# Patient Record
Sex: Male | Born: 1950
Health system: Southern US, Community
[De-identification: ages and names within clinical notes are randomized; demographics above are authoritative.]

## PROBLEM LIST (undated history)

## (undated) DIAGNOSIS — J309 Allergic rhinitis, unspecified: Secondary | ICD-10-CM

## (undated) DIAGNOSIS — I10 Essential (primary) hypertension: Secondary | ICD-10-CM

## (undated) DIAGNOSIS — M549 Dorsalgia, unspecified: Secondary | ICD-10-CM

## (undated) DIAGNOSIS — E785 Hyperlipidemia, unspecified: Secondary | ICD-10-CM

## (undated) DIAGNOSIS — J449 Chronic obstructive pulmonary disease, unspecified: Secondary | ICD-10-CM

## (undated) DIAGNOSIS — I639 Cerebral infarction, unspecified: Secondary | ICD-10-CM

## (undated) DIAGNOSIS — J189 Pneumonia, unspecified organism: Secondary | ICD-10-CM

## (undated) DIAGNOSIS — J45909 Unspecified asthma, uncomplicated: Secondary | ICD-10-CM

## (undated) DIAGNOSIS — I2510 Atherosclerotic heart disease of native coronary artery without angina pectoris: Secondary | ICD-10-CM

## (undated) DIAGNOSIS — K219 Gastro-esophageal reflux disease without esophagitis: Secondary | ICD-10-CM

## (undated) DIAGNOSIS — M1990 Unspecified osteoarthritis, unspecified site: Secondary | ICD-10-CM

## (undated) DIAGNOSIS — J329 Chronic sinusitis, unspecified: Secondary | ICD-10-CM

## (undated) HISTORY — DX: Essential (primary) hypertension: I10

## (undated) HISTORY — DX: Unspecified osteoarthritis, unspecified site: M19.90

## (undated) HISTORY — PX: OTHER SURGICAL HISTORY: SHX169

## (undated) HISTORY — PX: KNEE ARTHROSCOPY: SUR90

## (undated) HISTORY — DX: Atherosclerotic heart disease of native coronary artery without angina pectoris: I25.10

## (undated) HISTORY — DX: Chronic sinusitis, unspecified: J32.9

## (undated) HISTORY — DX: Unspecified asthma, uncomplicated: J45.909

## (undated) HISTORY — DX: Allergic rhinitis, unspecified: J30.9

## (undated) HISTORY — PX: CARDIAC CATHETERIZATION: SHX172

## (undated) HISTORY — DX: Cerebral infarction, unspecified: I63.9

---

## 1999-02-02 ENCOUNTER — Ambulatory Visit (HOSPITAL_COMMUNITY): Admission: RE | Admit: 1999-02-02 | Discharge: 1999-02-02 | Payer: Self-pay | Admitting: Specialist

## 1999-02-02 ENCOUNTER — Encounter: Payer: Self-pay | Admitting: Specialist

## 2004-09-05 ENCOUNTER — Ambulatory Visit (HOSPITAL_BASED_OUTPATIENT_CLINIC_OR_DEPARTMENT_OTHER): Admission: RE | Admit: 2004-09-05 | Discharge: 2004-09-05 | Payer: Self-pay | Admitting: Specialist

## 2005-04-22 ENCOUNTER — Ambulatory Visit: Payer: Self-pay | Admitting: Internal Medicine

## 2005-05-14 ENCOUNTER — Ambulatory Visit: Payer: Self-pay | Admitting: Internal Medicine

## 2005-09-17 ENCOUNTER — Ambulatory Visit: Payer: Self-pay | Admitting: Internal Medicine

## 2005-10-08 ENCOUNTER — Ambulatory Visit: Payer: Self-pay | Admitting: Internal Medicine

## 2005-10-22 ENCOUNTER — Encounter: Payer: Self-pay | Admitting: Internal Medicine

## 2005-10-22 ENCOUNTER — Ambulatory Visit: Payer: Self-pay | Admitting: Internal Medicine

## 2006-02-10 ENCOUNTER — Ambulatory Visit: Payer: Self-pay | Admitting: Internal Medicine

## 2006-02-14 ENCOUNTER — Ambulatory Visit: Payer: Self-pay | Admitting: Internal Medicine

## 2006-05-26 ENCOUNTER — Ambulatory Visit: Payer: Self-pay | Admitting: Internal Medicine

## 2006-12-08 ENCOUNTER — Ambulatory Visit: Payer: Self-pay | Admitting: Internal Medicine

## 2007-01-13 ENCOUNTER — Ambulatory Visit: Payer: Self-pay | Admitting: Internal Medicine

## 2007-06-19 DIAGNOSIS — J309 Allergic rhinitis, unspecified: Secondary | ICD-10-CM

## 2007-06-19 DIAGNOSIS — I10 Essential (primary) hypertension: Secondary | ICD-10-CM

## 2007-06-19 HISTORY — DX: Allergic rhinitis, unspecified: J30.9

## 2007-06-19 HISTORY — DX: Essential (primary) hypertension: I10

## 2007-07-17 ENCOUNTER — Encounter: Payer: Self-pay | Admitting: Internal Medicine

## 2007-09-15 ENCOUNTER — Ambulatory Visit: Payer: Self-pay | Admitting: Internal Medicine

## 2007-09-15 DIAGNOSIS — J329 Chronic sinusitis, unspecified: Secondary | ICD-10-CM | POA: Insufficient documentation

## 2007-09-15 HISTORY — DX: Chronic sinusitis, unspecified: J32.9

## 2007-09-23 ENCOUNTER — Ambulatory Visit: Payer: Self-pay | Admitting: Internal Medicine

## 2007-09-23 DIAGNOSIS — J019 Acute sinusitis, unspecified: Secondary | ICD-10-CM

## 2007-09-23 LAB — CONVERTED CEMR LAB
ALT: 17 units/L (ref 0–53)
AST: 18 units/L (ref 0–37)
Albumin: 3.9 g/dL (ref 3.5–5.2)
Alkaline Phosphatase: 65 units/L (ref 39–117)
BUN: 13 mg/dL (ref 6–23)
Basophils Absolute: 0 10*3/uL (ref 0.0–0.1)
Basophils Relative: 0.1 % (ref 0.0–1.0)
Bilirubin, Direct: 0.2 mg/dL (ref 0.0–0.3)
CO2: 31 meq/L (ref 19–32)
Calcium: 9 mg/dL (ref 8.4–10.5)
Chloride: 102 meq/L (ref 96–112)
Cholesterol: 180 mg/dL (ref 0–200)
Creatinine, Ser: 0.9 mg/dL (ref 0.4–1.5)
Eosinophils Absolute: 0.5 10*3/uL (ref 0.0–0.6)
Eosinophils Relative: 5.5 % — ABNORMAL HIGH (ref 0.0–5.0)
GFR calc Af Amer: 113 mL/min
GFR calc non Af Amer: 93 mL/min
Glucose, Bld: 90 mg/dL (ref 70–99)
HCT: 42.2 % (ref 39.0–52.0)
HDL: 33.6 mg/dL — ABNORMAL LOW (ref >39.0)
Hemoglobin: 15.1 g/dL (ref 13.0–17.0)
LDL Cholesterol: 133 mg/dL — ABNORMAL HIGH (ref 0–99)
Lymphocytes Relative: 17.1 % (ref 12.0–46.0)
MCHC: 35.9 g/dL (ref 30.0–36.0)
MCV: 84.5 fL (ref 78.0–100.0)
Monocytes Absolute: 1 10*3/uL — ABNORMAL HIGH (ref 0.2–0.7)
Monocytes Relative: 11.5 % — ABNORMAL HIGH (ref 3.0–11.0)
Neutro Abs: 6 10*3/uL (ref 1.4–7.7)
Neutrophils Relative %: 65.8 % (ref 43.0–77.0)
PSA: 0.45 ng/mL (ref 0.10–4.00)
Platelets: 232 10*3/uL (ref 150–400)
Potassium: 4.2 meq/L (ref 3.5–5.1)
RBC: 4.99 M/uL (ref 4.22–5.81)
RDW: 12.9 % (ref 11.5–14.6)
Sodium: 140 meq/L (ref 135–145)
TSH: 1.54 microintl units/mL (ref 0.35–5.50)
Total Bilirubin: 0.8 mg/dL (ref 0.3–1.2)
Total CHOL/HDL Ratio: 5.4
Total Protein: 6.4 g/dL (ref 6.0–8.3)
Triglycerides: 68 mg/dL (ref 0–149)
VLDL: 14 mg/dL (ref 0–40)
WBC: 9 10*3/uL (ref 4.5–10.5)

## 2007-09-29 ENCOUNTER — Ambulatory Visit: Payer: Self-pay | Admitting: Internal Medicine

## 2008-03-16 ENCOUNTER — Encounter: Payer: Self-pay | Admitting: Internal Medicine

## 2008-03-21 ENCOUNTER — Ambulatory Visit: Payer: Self-pay | Admitting: Internal Medicine

## 2008-03-21 DIAGNOSIS — R079 Chest pain, unspecified: Secondary | ICD-10-CM

## 2008-03-28 ENCOUNTER — Telehealth: Payer: Self-pay | Admitting: Internal Medicine

## 2008-04-26 ENCOUNTER — Telehealth: Payer: Self-pay | Admitting: Internal Medicine

## 2008-07-05 ENCOUNTER — Ambulatory Visit: Payer: Self-pay | Admitting: Internal Medicine

## 2008-08-17 ENCOUNTER — Ambulatory Visit: Payer: Self-pay | Admitting: Internal Medicine

## 2008-08-17 ENCOUNTER — Telehealth (INDEPENDENT_AMBULATORY_CARE_PROVIDER_SITE_OTHER): Payer: Self-pay

## 2008-08-17 DIAGNOSIS — J45909 Unspecified asthma, uncomplicated: Secondary | ICD-10-CM | POA: Insufficient documentation

## 2008-08-17 DIAGNOSIS — J069 Acute upper respiratory infection, unspecified: Secondary | ICD-10-CM | POA: Insufficient documentation

## 2008-08-17 HISTORY — DX: Unspecified asthma, uncomplicated: J45.909

## 2008-08-22 ENCOUNTER — Telehealth: Payer: Self-pay | Admitting: Internal Medicine

## 2008-09-06 ENCOUNTER — Telehealth: Payer: Self-pay | Admitting: Internal Medicine

## 2008-10-05 ENCOUNTER — Telehealth: Payer: Self-pay | Admitting: Family Medicine

## 2009-07-31 ENCOUNTER — Encounter (INDEPENDENT_AMBULATORY_CARE_PROVIDER_SITE_OTHER): Payer: Self-pay | Admitting: *Deleted

## 2009-08-29 ENCOUNTER — Telehealth (INDEPENDENT_AMBULATORY_CARE_PROVIDER_SITE_OTHER): Payer: Self-pay

## 2009-09-05 ENCOUNTER — Ambulatory Visit: Payer: Self-pay | Admitting: Internal Medicine

## 2009-09-05 LAB — CONVERTED CEMR LAB
ALT: 22 units/L (ref 0–53)
AST: 18 units/L (ref 0–37)
Albumin: 3.9 g/dL (ref 3.5–5.2)
Alkaline Phosphatase: 50 units/L (ref 39–117)
BUN: 11 mg/dL (ref 6–23)
Basophils Absolute: 0 10*3/uL (ref 0.0–0.1)
Basophils Relative: 0.9 % (ref 0.0–3.0)
Bilirubin Urine: NEGATIVE
Bilirubin, Direct: 0.2 mg/dL (ref 0.0–0.3)
Blood in Urine, dipstick: NEGATIVE
CO2: 28 meq/L (ref 19–32)
Calcium: 8.9 mg/dL (ref 8.4–10.5)
Chloride: 104 meq/L (ref 96–112)
Cholesterol: 210 mg/dL — ABNORMAL HIGH (ref 0–200)
Creatinine, Ser: 0.9 mg/dL (ref 0.4–1.5)
Direct LDL: 153.4 mg/dL
Eosinophils Absolute: 0.4 10*3/uL (ref 0.0–0.7)
Eosinophils Relative: 8.4 % — ABNORMAL HIGH (ref 0.0–5.0)
GFR calc non Af Amer: 92.62 mL/min (ref >60)
Glucose, Bld: 91 mg/dL (ref 70–99)
Glucose, Urine, Semiquant: NEGATIVE
HCT: 45.8 % (ref 39.0–52.0)
HDL: 38.8 mg/dL — ABNORMAL LOW (ref >39.00)
Hemoglobin: 15.6 g/dL (ref 13.0–17.0)
Ketones, urine, test strip: NEGATIVE
Lymphocytes Relative: 23.2 % (ref 12.0–46.0)
Lymphs Abs: 1.2 10*3/uL (ref 0.7–4.0)
MCHC: 34.2 g/dL (ref 30.0–36.0)
MCV: 88.1 fL (ref 78.0–100.0)
Monocytes Absolute: 0.6 10*3/uL (ref 0.1–1.0)
Monocytes Relative: 11 % (ref 3.0–12.0)
Neutro Abs: 2.9 10*3/uL (ref 1.4–7.7)
Neutrophils Relative %: 56.5 % (ref 43.0–77.0)
Nitrite: NEGATIVE
PSA: 0.44 ng/mL (ref 0.10–4.00)
Platelets: 229 10*3/uL (ref 150.0–400.0)
Potassium: 4.6 meq/L (ref 3.5–5.1)
Protein, U semiquant: NEGATIVE
RBC: 5.2 M/uL (ref 4.22–5.81)
RDW: 12.7 % (ref 11.5–14.6)
Sodium: 139 meq/L (ref 135–145)
Specific Gravity, Urine: 1.02
TSH: 0.87 microintl units/mL (ref 0.35–5.50)
Total Bilirubin: 0.9 mg/dL (ref 0.3–1.2)
Total CHOL/HDL Ratio: 5
Total Protein: 7.2 g/dL (ref 6.0–8.3)
Triglycerides: 105 mg/dL (ref 0.0–149.0)
Urobilinogen, UA: 0.2
VLDL: 21 mg/dL (ref 0.0–40.0)
WBC Urine, dipstick: NEGATIVE
WBC: 5.1 10*3/uL (ref 4.5–10.5)
pH: 7

## 2009-10-09 ENCOUNTER — Ambulatory Visit: Payer: Self-pay | Admitting: Internal Medicine

## 2009-10-09 DIAGNOSIS — M1990 Unspecified osteoarthritis, unspecified site: Secondary | ICD-10-CM | POA: Insufficient documentation

## 2009-10-09 HISTORY — DX: Unspecified osteoarthritis, unspecified site: M19.90

## 2010-03-28 ENCOUNTER — Telehealth: Payer: Self-pay | Admitting: Internal Medicine

## 2010-06-04 ENCOUNTER — Encounter: Payer: Self-pay | Admitting: Internal Medicine

## 2010-11-06 NOTE — Letter (Signed)
Summary: [facility]  [facility]   Imported By: Maryln Gottron 07/02/2010 13:02:27  _____________________________________________________________________  External Attachment:    Type:   Image     Comment:   External Document

## 2010-11-06 NOTE — Assessment & Plan Note (Signed)
Summary: CPX/NJR   Vital Signs:  Patient profile:   60 year old male Height:      76 inches Weight:      238 pounds BMI:     29.07 BP sitting:   152 / 84  (left arm) Cuff size:   regular  Vitals Entered By: Raechel Ache, RN (October 09, 2009 2:31 PM) CC: CPX, labs done.   CC:  CPX and labs done.Marland Kitchen  History of Present Illness:  60 year old patient has a history of asthma and allergic rhinitis, who is seen today for an annual physical.  He has hypertension, which has been stable.  He has recently  initiated immunotherapy.  Flu Vaccine Consent Questions     Do you have a history of severe allergic reactions to this vaccine? no    Any prior history of allergic reactions to egg and/or gelatin? no    Do you have a sensitivity to the preservative Thimersol? no    Do you have a past history of Guillan-Barre Syndrome? no    Do you currently have an acute febrile illness? no    Have you ever had a severe reaction to latex? no    Vaccine information given and explained to patient? yes    Are you currently pregnant? no    Lot Number:[id number]   Exp Date:04/05/2010   Site Given  Left Deltoid IM  Allergies: 1)  ! Ery-Tab (Erythromycin Base)   Past History:  Past Medical History: Allergic rhinitis Hypertension Asthma  ( Immunotherapy)   Osteoarthritis  Past Surgical History: Reviewed history from 03/21/2008 and no changes required.  colonoscopy January 2007  Family History: Reviewed history from 09/29/2007 and no changes required. father died at age 37 laryngeal cancer mother died age 24  One older brother and sister are both in good health  Social History: Reviewed history from 09/29/2007 and no changes required. 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    Physical Exam  General:  Well-developed,well-nourished,in no acute distress; alert,appropriate and cooperative throughout examination Head:  Normocephalic and atraumatic without obvious abnormalities. No apparent alopecia or balding. Eyes:  No corneal or conjunctival inflammation noted. EOMI. Perrla. Funduscopic exam benign, without hemorrhages, exudates or papilledema. Vision grossly normal. Ears:  External ear exam shows no significant lesions or deformities.  Otoscopic examination reveals clear canals, tympanic membranes are intact bilaterally without bulging, retraction, inflammation or discharge. Hearing is grossly normal bilaterally. Nose:  External nasal examination shows no deformity or inflammation. Nasal mucosa are pink and moist without lesions or exudates. Mouth:  Oral mucosa and oropharynx without lesions or exudates.  Teeth in good repair. Neck:  No deformities, masses, or tenderness noted. Chest Wall:  No deformities, masses, tenderness or gynecomastia noted. Breasts:  No masses or gynecomastia noted Lungs:  Normal respiratory effort, chest expands symmetrically. Lungs are clear to auscultation, no crackles or wheezes. Heart:  Normal rate and regular rhythm. S1 and S2 normal without gallop, murmur, click, rub or other extra sounds. Abdomen:  Bowel sounds positive,abdomen soft and non-tender without  masses, organomegaly or hernias noted. Rectal:  No external abnormalities noted. Normal sphincter tone. No rectal masses or tenderness. Genitalia:  Testes bilaterally descended without nodularity, tenderness or masses.  No scrotal masses or lesions. No penis lesions or urethral discharge. Prostate:  Prostate gland firm and smooth, no enlargement, nodularity, tenderness, mass, asymmetry or induration. Msk:  No deformity or scoliosis noted of thoracic or lumbar spine.   Pulses:  R and L carotid,radial,femoral,dorsalis pedis and posterior tibial pulses are full and equal bilaterally Extremities:  No clubbing, cyanosis, edema, or deformity noted with normal full range of motion of all joints.   Neurologic:  No cranial nerve deficits noted. Station and gait are normal. Plantar reflexes are down-going bilaterally. DTRs are symmetrical throughout. Sensory, motor and coordinative functions appear intact. Skin:  Intact without suspicious lesions or rashes Cervical Nodes:  No lymphadenopathy noted Axillary Nodes:  No palpable lymphadenopathy Inguinal Nodes:  No significant adenopathy Psych:  Cognition and judgment appear intact. Alert and cooperative with normal attention span and concentration. No apparent delusions, illusions, hallucinations   Impression & Recommendations:  Problem # 1:  PHYSICAL EXAMINATION (ICD-V70.0)  Complete Medication List: 1)  Advair Diskus 250-50 Mcg/dose Misc (Fluticasone-salmeterol) .... Inhale 1 puff as directed twice a day 2)  Celebrex 200 Mg Caps (Celecoxib) .... Take 1 capsule by mouth once a day 3)  Nasonex 50 Mcg/act Susp (Mometasone furoate) .... As dir 4)  Proair Hfa 108 (90 Base) Mcg/act Aers (Albuterol sulfate) .... Uad 5)  Amlodipine Besylate 5 Mg Tabs (Amlodipine besylate) .... One daily 6)  Spironolactone 25 Mg Tabs (Spironolactone) .... One daily 7)  Singulair 10 Mg Tabs (Montelukast sodium) .Marland Kitchen.. 1 q am 8)  Levitra 20 Mg Tabs (Vardenafil hcl) .... Use daily as directed  Other Orders: Admin 1st Vaccine ([address]) Flu Vaccine 50yrs + (81191)  Patient Instructions: 1)  Discussed importance of regular exercise and recommended starting or continuing a regular exercise program  for good health. 2)  Check your Blood Pressure regularly. If it is above: you should make an appointment. 3)  Limit your Sodium (Salt). Prescriptions: SINGULAIR 10 MG TABS (MONTELUKAST SODIUM) 1 q am  #90 x 66   Entered and Authorized by:   Gordy Savers  MD   Signed by:   Gordy Savers  MD on 10/09/2009   Method used:   Print then Give to Patient   RxID:   [id number] SPIRONOLACTONE 25 MG TABS (SPIRONOLACTONE) one daily  #90 Tablet x 6   Entered and Authorized by:   Gordy Savers  MD   Signed by:   Gordy Savers  MD on 10/09/2009   Method used:   Print then Give to Patient   RxID:   [id number] AMLODIPINE BESYLATE 5 MG TABS (AMLODIPINE BESYLATE) one daily  #90 x 6   Entered and Authorized by:   Gordy Savers  MD   Signed by:   Gordy Savers  MD on 10/09/2009   Method used:   Print then Give to Patient   RxID:   [id number] PROAIR HFA 108 (90 BASE) MCG/ACT  AERS (ALBUTEROL SULFATE) UAD  #17 Gram x 6   Entered and Authorized by:   Gordy Savers  MD   Signed by:   Gordy Savers  MD on 10/09/2009   Method used:   Print then Give to Patient   RxID:   [id number] NASONEX 50 MCG/ACT SUSP (MOMETASONE FUROATE) as dir  #3 x 6  Entered and Authorized by:   Gordy Savers  MD   Signed by:   Gordy Savers  MD on 10/09/2009   Method used:   Print then Give to Patient   RxID:   [id number] CELEBREX 200 MG CAPS (CELECOXIB) Take 1 capsule by mouth once a day  #180 Capsule x 1   Entered and Authorized by:   Gordy Savers  MD   Signed by:   Gordy Savers  MD on 10/09/2009   Method used:   Print then Give to Patient   RxID:   [id number] ADVAIR DISKUS 250-50 MCG/DOSE MISC (FLUTICASONE-SALMETEROL) Inhale 1 puff as directed twice a day  #3 x 6   Entered and Authorized by:   Gordy Savers  MD   Signed by:   Gordy Savers  MD on 10/09/2009   Method used:   Print then Give to Patient    RxID:   [id number]

## 2010-11-06 NOTE — Progress Notes (Signed)
Summary: samples of Viagra  Phone Note Call from Patient   Caller: Patient Call For: Gordy Savers  MD Summary of Call: Pt is asking for samples of Viagra or a prescription, please.  [phone] Initial call taken by: Lynann Beaver CMA,  March 28, 2010 1:31 PM  Follow-up for Phone Call        Rx Viagra 100 mg  #6  RF 12   Follow-up by: Gordy Savers  MD,  April 01, 2010 8:11 PM    New/Updated Medications: VIAGRA 100 MG TABS (SILDENAFIL CITRATE) One prn Prescriptions: VIAGRA 100 MG TABS (SILDENAFIL CITRATE) One prn  #6 x 12   Entered by:   Rudy Jew, RN   Authorized by:   Gordy Savers  MD   Signed by:   Rudy Jew, RN on 04/02/2010   Method used:   Electronically to        [facility] (ret[facility])       [address]       [place], [place]  [address]       Ph: [phone] or [phone]       Fax: [phone]   RxID:   [id number]

## 2011-02-22 NOTE — Op Note (Signed)
NAME:  Charles Mccoy, Charles Mccoy                 ACCOUNT NO.:  [id number]   MEDICAL RECORD NO.:  [id number]          PATIENT TYPE:  AMB   LOCATION:  [facility]                         FACILITY:  [facility]   PHYSICIAN:  Jene Every, M.D.    DATE OF BIRTH:  1951-02-25   DATE OF PROCEDURE:  09/05/2004  DATE OF DISCHARGE:                                 OPERATIVE REPORT   PREOPERATIVE DIAGNOSIS:  Bilateral osteoarthritis of the knees, left knee  medial meniscus tear.   POSTOPERATIVE DIAGNOSIS:  Bilateral osteoarthritis of the knees, left knee  medial meniscus tear.   PROCEDURE:  1.  Left knee arthroscopy, partial medial meniscectomy, chondroplasty of the      medial femoral condyle and patella.  2.  Right knee arthroscopy, debridement, chondroplasty of the medial femoral      condyle, shaving of the medial meniscus, chondroplasty of the patella,      debridement of the lateral joint line.   ANESTHESIA:  General.   SURGEON:  Jene Every, M.D.   ASSISTANT:  Roma Schanz, P.A.   INDICATIONS FOR PROCEDURE:  The patient is a 60 year old who has a long-  standing tricompartmental osteoarthrosis of the knees bilaterally.  He has  undergone previous steroid injection, arthroscopic debridement.  He  currently had been experiencing symptoms related to a torn medial meniscus  of the left knee confirmed with MRI.  He had recently over the past month  had an increasing symptomatic degenerative pain of the right knee.  He has  had osteoarthritic changes, moderately severe, on radiographs.  He has had  multiple corticosteroid injections and anti-inflammatory medications.  He  had requested, in addition to partial medial meniscectomy, debridement of  the left knee and arthroscopic debridement of the right knee at a concurrent  setting.  We discussed the risks and benefits of the procedure, including  bleeding, infection, damage to vascular structures, no change in symptoms or  worsening in symptoms, need for  repeat debridement, or total knee  arthroplasty in the future, particularly on the right.   DESCRIPTION OF PROCEDURE:  With the patient in the supine position after  general anesthesia, 1 g of Kefzol, both lower extremities were prepped and  draped in the usual sterile fashion.  No tourniquet was utilized.   Attention was turned towards the left first.  A lateral parapatellar portal  and a suprapatellar portal were fashioned with a #11 blade.  __________  irrigant was utilized to insufflate the joint.  Under direct visualization,  a medial parapatellar portal was fashioned with a #11 blade after  localization with an 18-gauge needle, sparring the medial meniscus.  This  was after insertion of the arthroscopic camera laterally.  Noted were some  very deep changes to the medial compartment, a meniscal tear of the  posteromedial aspect of the meniscus.  There was loose cartilaginous debris  as well.  There was fraying of the ACL, degenerative changes of the  patellofemoral joint and of the lateral compartment.  There was a very small  area of grade 4 change less than a centimeter beneath the medial  meniscus  anteriorly.  A basket rongeur was introduced through the medial portal and  utilized to perform a partial medial meniscectomy to a stable base, which  was further contoured with a 4/2 __________ shaver.  It was stable to probe  palpation following that.  Chondroplasty of the femoral condyle, tibial  plateau, and the patella was performed to a stable base.  The knee was  copiously lavaged.  There was debridement of the lateral compartment, with  loose cartilaginous debris noted as well.  The PCL was not visualized.  There was normal patellofemoral tracking.  The gutters were unremarkable.  I  copiously lavaged the knee and reexamined the medial meniscus.  The remnant  was stable to probe palpation.  There was no residual tear requiring  arthroscopic treatment.  The knee was copiously  lavaged.  All  instrumentation was removed.  The portals were closed with 4-0 nylon simple  sutures.  0.25% Marcaine with epinephrine was infiltrated in the joint.   In a similar fashion on the right knee, the right knee was entered, first  with a lateral parapatellar portal.  The camera was inserted, and the medial  parapatellar portal was again fashioned with a #11 blade after localization  with an 18-gauge needle, sparring the medial meniscus.  Irrigant was  utilized to insufflate the joint.  Noted initially was a partial tear of the  ACL.  The remnant was stable.  No anterior dura was noted.  Extensive grade  4 changes of the medial femoral condyle and moderately over the tibial  plateau were noted.  Degenerative changes of the meniscus were noted.  Grade  3 changes of the medial femoral condyle as well as the patella were  appreciated.  There was a normal patellofemoral tracking.  Minor grade 2  changes of the lateral compartment were noted as well.  The shaver was  introduced and utilized to perform chondroplasty of the medial femoral  condyle, tibial plateau, and the patella.  It was used to debride the  lateral joint as well.  The meniscus was shaved as well some degenerative  tearing of the meniscus.  The gutters were unremarkable.  There was normal  patellofemoral tracking.  There was good range of motion of the knee.  Both  menisci were then probed and reevaluated.  There was no other loose or torn  meniscus amenable to arthroscopic intervention.  We removed the arthroscope  after copious lavage and closed the portals with 4-0 nylon simple suture.  0.25% Marcaine with epinephrine was infiltrated in the joint.  The wound was  dressed sterilely and secured with an Ace.  The patient was then awakened  without difficulty and transported to the recovery room in satisfactory  condition.   The patient tolerated the procedure well.  There were no complications.    Trey Paula   JB/MEDQ   D:  09/06/2004  T:  09/06/2004  Job:  [id number]

## 2011-02-24 ENCOUNTER — Other Ambulatory Visit: Payer: Self-pay | Admitting: Internal Medicine

## 2011-02-25 ENCOUNTER — Other Ambulatory Visit: Payer: Self-pay | Admitting: Internal Medicine

## 2011-02-25 NOTE — Telephone Encounter (Signed)
erequest recv'd this AM and i have already filled - will need to be seen - last seen 10/2009 . Gave 6wks of med.

## 2011-02-25 NOTE — Telephone Encounter (Signed)
Refill Spironolactone 25mg , amlodipine besylate 5 mg sent [facility]

## 2011-03-07 ENCOUNTER — Other Ambulatory Visit: Payer: Self-pay | Admitting: Internal Medicine

## 2011-04-01 ENCOUNTER — Ambulatory Visit (INDEPENDENT_AMBULATORY_CARE_PROVIDER_SITE_OTHER): Payer: BC Managed Care – PPO | Admitting: Internal Medicine

## 2011-04-01 ENCOUNTER — Encounter: Payer: Self-pay | Admitting: Internal Medicine

## 2011-04-01 ENCOUNTER — Other Ambulatory Visit: Payer: Self-pay | Admitting: Internal Medicine

## 2011-04-01 DIAGNOSIS — I10 Essential (primary) hypertension: Secondary | ICD-10-CM

## 2011-04-01 DIAGNOSIS — M1990 Unspecified osteoarthritis, unspecified site: Secondary | ICD-10-CM

## 2011-04-01 NOTE — Patient Instructions (Signed)
Limit your sodium (Salt) intake  You need to lose weight.  Consider a lower calorie diet and regular exercise.  Please check your blood pressure on a regular basis.  If it is consistently greater than 150/90, please make an office appointment.  

## 2011-04-01 NOTE — Progress Notes (Signed)
  Subjective:    Patient ID: Charles Mccoy, male    DOB: 02/22/1951, 60 y.o.   MRN: [id number]  HPI  60 year old patient who is seen today for followup of his hypertension. He has a chief complain of some pedal edema. He was working and was constant spending hours on his feet and eating out frequently. Since his return with elevation and better diet the edema has largely resolved. He is also on amlodipine for blood pressure control. No cardiopulmonary complaints   Review of Systems  Constitutional: Negative for fever, chills, appetite change and fatigue.  HENT: Negative for hearing loss, ear pain, congestion, sore throat, trouble swallowing, neck stiffness, dental problem, voice change and tinnitus.   Eyes: Negative for pain, discharge and visual disturbance.  Respiratory: Negative for cough, chest tightness, wheezing and stridor.   Cardiovascular: Positive for leg swelling. Negative for chest pain and palpitations.  Gastrointestinal: Negative for nausea, vomiting, abdominal pain, diarrhea, constipation, blood in stool and abdominal distention.  Genitourinary: Negative for urgency, hematuria, flank pain, discharge, difficulty urinating and genital sores.  Musculoskeletal: Negative for myalgias, back pain, joint swelling, arthralgias and gait problem.  Skin: Negative for rash.  Neurological: Negative for dizziness, syncope, speech difficulty, weakness, numbness and headaches.  Hematological: Negative for adenopathy. Does not bruise/bleed easily.  Psychiatric/Behavioral: Negative for behavioral problems and dysphoric mood. The patient is not nervous/anxious.        Objective:   Physical Exam  Constitutional: He is oriented to person, place, and time. He appears well-developed.       Blood pressure 130/80  HENT:  Head: Normocephalic.  Right Ear: External ear normal.  Left Ear: External ear normal.  Eyes: Conjunctivae and EOM are normal.  Neck: Normal range of motion.  Cardiovascular: Normal  rate and normal heart sounds.   Pulmonary/Chest: Breath sounds normal.  Abdominal: Bowel sounds are normal.  Musculoskeletal: Normal range of motion. He exhibits no edema and no tenderness.  Neurological: He is alert and oriented to person, place, and time.  Psychiatric: He has a normal mood and affect. His behavior is normal.          Assessment & Plan:   Hypertension well controlled Pedal edema. Related to increase [place] as well as [place] load  [employer] schedule complete physical in the fall. Medical regimen unchanged Weight loss restricted salt diet all encouraged

## 2011-05-03 ENCOUNTER — Other Ambulatory Visit: Payer: Self-pay | Admitting: Internal Medicine

## 2011-07-02 ENCOUNTER — Other Ambulatory Visit (INDEPENDENT_AMBULATORY_CARE_PROVIDER_SITE_OTHER): Payer: BC Managed Care – PPO

## 2011-07-02 DIAGNOSIS — Z0000 Encounter for general adult medical examination without abnormal findings: Secondary | ICD-10-CM

## 2011-07-02 LAB — LIPID PANEL: Cholesterol: 193 mg/dL (ref 0–200)

## 2011-07-02 LAB — CBC WITH DIFFERENTIAL/PLATELET
Eosinophils Relative: 10.9 % — ABNORMAL HIGH (ref 0.0–5.0)
HCT: 45.3 % (ref 39.0–52.0)
Lymphocytes Relative: 20.7 % (ref 12.0–46.0)
Lymphs Abs: 1.3 10*3/uL (ref 0.7–4.0)
Monocytes Relative: 9.8 % (ref 3.0–12.0)
Neutrophils Relative %: 58.1 % (ref 43.0–77.0)
Platelets: 219 10*3/uL (ref 150.0–400.0)
WBC: 6.1 10*3/uL (ref 4.5–10.5)

## 2011-07-02 LAB — POCT URINALYSIS DIPSTICK
Bilirubin, UA: NEGATIVE
Glucose, UA: NEGATIVE
Leukocytes, UA: NEGATIVE
Nitrite, UA: NEGATIVE
pH, UA: 7

## 2011-07-02 LAB — HEPATIC FUNCTION PANEL
ALT: 20 U/L (ref 0–53)
AST: 17 U/L (ref 0–37)
Alkaline Phosphatase: 58 U/L (ref 39–117)
Bilirubin, Direct: 0.1 mg/dL (ref 0.0–0.3)
Total Bilirubin: 0.8 mg/dL (ref 0.3–1.2)

## 2011-07-02 LAB — BASIC METABOLIC PANEL
BUN: 15 mg/dL (ref 6–23)
GFR: 82.44 mL/min (ref >60.00)
Potassium: 4.6 mEq/L (ref 3.5–5.1)

## 2011-07-02 LAB — PSA: PSA: 0.52 ng/mL (ref 0.10–4.00)

## 2011-07-09 ENCOUNTER — Ambulatory Visit (INDEPENDENT_AMBULATORY_CARE_PROVIDER_SITE_OTHER): Payer: BC Managed Care – PPO | Admitting: Internal Medicine

## 2011-07-09 ENCOUNTER — Encounter: Payer: Self-pay | Admitting: Internal Medicine

## 2011-07-09 DIAGNOSIS — J309 Allergic rhinitis, unspecified: Secondary | ICD-10-CM

## 2011-07-09 DIAGNOSIS — Z0000 Encounter for general adult medical examination without abnormal findings: Secondary | ICD-10-CM

## 2011-07-09 DIAGNOSIS — J45909 Unspecified asthma, uncomplicated: Secondary | ICD-10-CM

## 2011-07-09 DIAGNOSIS — I10 Essential (primary) hypertension: Secondary | ICD-10-CM

## 2011-07-09 MED ORDER — SILDENAFIL CITRATE 100 MG PO TABS: 100.0000 mg | ORAL_TABLET | Freq: Every day | ORAL | Status: DC | PRN

## 2011-07-09 MED ORDER — AMLODIPINE BESYLATE 5 MG PO TABS: 5.0000 mg | ORAL_TABLET | Freq: Every day | ORAL | Status: DC

## 2011-07-09 MED ORDER — CELECOXIB 200 MG PO CAPS: 200.0000 mg | ORAL_CAPSULE | Freq: Every day | ORAL | Status: DC

## 2011-07-09 MED ORDER — MONTELUKAST SODIUM 10 MG PO TABS: 10.0000 mg | ORAL_TABLET | Freq: Every day | ORAL | Status: DC

## 2011-07-09 MED ORDER — FLUTICASONE-SALMETEROL 250-50 MCG/DOSE IN AEPB: 1.0000 | INHALATION_SPRAY | Freq: Two times a day (BID) | RESPIRATORY_TRACT | Status: DC

## 2011-07-09 MED ORDER — VARDENAFIL HCL 20 MG PO TABS: 20.0000 mg | ORAL_TABLET | Freq: Every day | ORAL | Status: DC | PRN

## 2011-07-09 MED ORDER — SPIRONOLACTONE 25 MG PO TABS: 25.0000 mg | ORAL_TABLET | Freq: Every day | ORAL | Status: DC

## 2011-07-09 MED ORDER — ALBUTEROL SULFATE HFA 108 (90 BASE) MCG/ACT IN AERS: 2.0000 | INHALATION_SPRAY | Freq: Four times a day (QID) | RESPIRATORY_TRACT | Status: DC | PRN

## 2011-07-09 NOTE — Patient Instructions (Signed)
Limit your sodium (Salt) intake  Please check your blood pressure on a regular basis.  If it is consistently greater than 150/90, please make an office appointment.    It is important that you exercise regularly, at least 20 minutes 3 to 4 times per week.  If you develop chest pain or shortness of breath seek  medical attention.  Return in one year for follow-up  

## 2011-07-09 NOTE — Progress Notes (Signed)
Subjective:    Patient ID: Unnamed Charles Mccoy, male    DOB: 02-Apr-1951, 60 y.o.   MRN: [id number]  HPI History of Present Illness:   60 year old patient has a history of asthma and allergic rhinitis, who is seen today for an annual physical. He has hypertension, which has been stable. He has recently initiated immunotherapy. In  Allergies:  1) ! Ery-Tab (Erythromycin Base)  Past History:  Past Medical History:  Allergic rhinitis  Hypertension  Asthma ( Immunotherapy)  Osteoarthritis in a   Past Surgical History:  Reviewed history from 03/21/2008 and no changes required.  colonoscopy January 2007   Family History:  Reviewed history from 09/29/2007 and no changes required.  father died at age 25 laryngeal cancer  mother died age 56  One older brother and sister are both in good health   Social History:  Reviewed history from 09/29/2007 and no changes required.  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  HENT: Positive for congestion and rhinorrhea. Negative for hearing loss, ear pain, sneezing, mouth sores, trouble swallowing, neck pain, neck stiffness, dental problem, voice change, sinus pressure and tinnitus.   Eyes: Negative for photophobia, pain, redness and visual disturbance.  Respiratory: Positive for cough and shortness of breath. Negative for apnea, choking, chest tightness and wheezing.   Cardiovascular: Negative for chest pain, palpitations and leg swelling.  Gastrointestinal: Negative for nausea, vomiting, abdominal pain, diarrhea, constipation, blood in stool, abdominal distention, anal bleeding and rectal pain.  Genitourinary: Negative for dysuria, urgency, frequency, hematuria, flank pain, decreased urine volume, discharge, penile swelling, scrotal swelling, difficulty urinating, genital sores and testicular  pain.  Musculoskeletal: Negative for myalgias, back pain, joint swelling, arthralgias and gait problem.  Skin: Negative for color change, rash and wound.  Neurological: Negative for dizziness, tremors, seizures, syncope, facial asymmetry, speech difficulty, weakness, light-headedness, numbness and headaches.  Hematological: Negative for adenopathy. Does not bruise/bleed easily.  Psychiatric/Behavioral: Negative for suicidal ideas, hallucinations, behavioral problems, confusion, sleep disturbance, self-injury, dysphoric mood, decreased concentration and agitation. The patient is not nervous/anxious.        Objective:   Physical Exam  Constitutional: He appears well-developed and well-nourished.  HENT:  Head: Normocephalic and atraumatic.  Right Ear: External ear normal.  Left Ear: External ear normal.  Nose: Nose normal.  Mouth/Throat: Oropharynx is clear and moist.  Eyes: Conjunctivae and EOM are normal. Pupils are equal, round, and reactive to light. No scleral icterus.  Neck: Normal range of motion. Neck supple. No JVD present. No thyromegaly present.  Cardiovascular: Regular rhythm, normal heart sounds and intact distal pulses.  Exam reveals no gallop and no friction rub.   No murmur heard. Pulmonary/Chest: Effort normal and breath sounds normal. He exhibits no tenderness.  Abdominal: Soft. Bowel sounds are normal. He exhibits no distension and no mass. There is no tenderness.  Genitourinary: Prostate normal and penis normal. Guaiac negative stool.        prostate +2 enlarged  Musculoskeletal: Normal range of motion. He exhibits no edema and no tenderness.  Lymphadenopathy:    He has no cervical adenopathy.  Neurological: He is alert. He has normal reflexes. No cranial nerve deficit. Coordination normal.  Skin: Skin is warm and dry. No rash noted.  Psychiatric: He has a normal mood and affect. His behavior is normal.  Assessment & Plan:     preventive health  examination  Asthma stable  Hypertension well controlled. We'll continue present regimen and home blood pressure monitoring   Recheck one year  Followup allergy

## 2011-07-10 ENCOUNTER — Other Ambulatory Visit: Payer: Self-pay | Admitting: Internal Medicine

## 2011-10-09 ENCOUNTER — Ambulatory Visit (INDEPENDENT_AMBULATORY_CARE_PROVIDER_SITE_OTHER): Payer: BC Managed Care – PPO | Admitting: Family

## 2011-10-09 ENCOUNTER — Encounter: Payer: Self-pay | Admitting: Family

## 2011-10-09 VITALS — BP 118/80 | HR 103 | Temp 100.3°F | Wt 237.0 lb

## 2011-10-09 DIAGNOSIS — J157 Pneumonia due to Mycoplasma pneumoniae: Secondary | ICD-10-CM

## 2011-10-09 DIAGNOSIS — R509 Fever, unspecified: Secondary | ICD-10-CM

## 2011-10-09 DIAGNOSIS — R110 Nausea: Secondary | ICD-10-CM

## 2011-10-09 MED ORDER — PROMETHAZINE HCL 25 MG PO TABS: 25.0000 mg | ORAL_TABLET | Freq: Three times a day (TID) | ORAL | Status: AC | PRN

## 2011-10-09 MED ORDER — CEFUROXIME AXETIL 250 MG PO TABS: 250.0000 mg | ORAL_TABLET | Freq: Two times a day (BID) | ORAL | Status: AC

## 2011-10-09 MED ORDER — GUAIFENESIN-CODEINE 100-10 MG/5ML PO SYRP: 5.0000 mL | ORAL_SOLUTION | Freq: Three times a day (TID) | ORAL | Status: AC | PRN

## 2011-10-09 NOTE — Patient Instructions (Signed)

## 2011-10-09 NOTE — Progress Notes (Signed)
Subjective:    Patient ID: Charles Mccoy, male    DOB: 1950-11-21, 61 y.o.   MRN: [id number]  HPI A 61 year old white male, former smoker, patient of Dr. Kirtland Bouchard. is in today with complaints of nausea, fever, cough, congestion, runny nose, fatigue has been the one on for 4 days. If symptoms are worsening. His wife is sick with an upper respiratory infection as well. He's been using netty pot and over-the-counter cold and sinus medication with no relief.   Review of Systems  Constitutional: Positive for chills and appetite change.  HENT: Positive for rhinorrhea.   Eyes: Negative.   Respiratory: Positive for cough and wheezing.   Cardiovascular: Negative.   Musculoskeletal: Negative.   Skin: Negative.   Neurological: Negative.   Hematological: Negative.   Psychiatric/Behavioral: Negative.    Past Medical History  Diagnosis Date  . ALLERGIC RHINITIS 06/19/2007  . ASTHMA 08/17/2008  . HYPERTENSION 06/19/2007  . OSTEOARTHRITIS 10/09/2009  . SINUSITIS 09/15/2007    History   Social History  . Marital Status: Married    Spouse Name: N/A    Number of Children: N/A  . Years of Education: N/A   Occupational History  . Not on file.   Social History Main Topics  . Smoking status: Former Smoker    Types: Cigarettes    Quit date: 10/07/2001  . Smokeless tobacco: Never Used  . Alcohol Use: Yes  . Drug Use: No  . Sexually Active: Not on file   Other Topics Concern  . Not on file   Social History Narrative  . No narrative on file    No past surgical history on file.  No family history on file.  Allergies  Allergen Reactions  . Erythromycin Base     REACTION: stomach irritation    Current Outpatient Prescriptions on File Prior to Visit  Medication Sig Dispense Refill  . albuterol (PROAIR HFA) 108 (90 BASE) MCG/ACT inhaler Inhale 2 puffs into the lungs every 6 (six) hours as needed.  1 Inhaler  6  . amLODipine (NORVASC) 5 MG tablet Take 1 tablet (5 mg total) by mouth daily.  90  tablet  3  . celecoxib (CELEBREX) 200 MG capsule Take 1 capsule (200 mg total) by mouth daily.  45 capsule  6  . Fluticasone-Salmeterol (ADVAIR DISKUS) 250-50 MCG/DOSE AEPB Inhale 1 puff into the lungs every 12 (twelve) hours.  60 each  6  . montelukast (SINGULAIR) 10 MG tablet Take 1 tablet (10 mg total) by mouth at bedtime.  90 tablet  6  . sildenafil (VIAGRA) 100 MG tablet Take 1 tablet (100 mg total) by mouth daily as needed.  10 tablet  6  . spironolactone (ALDACTONE) 25 MG tablet Take 1 tablet (25 mg total) by mouth daily.  90 tablet  3  . vardenafil (LEVITRA) 20 MG tablet Take 1 tablet (20 mg total) by mouth daily as needed.  10 tablet  6    BP 118/80  Pulse 103  Temp(Src) 100.3 F (37.9 C) (Oral)  Wt 237 lb (107.502 kg)chart   Objective:   Physical Exam  Constitutional: He is oriented to person, place, and time. He appears well-developed and well-nourished.  HENT:  Right Ear: External ear normal.  Left Ear: External ear normal.  Nose: Nose normal.  Mouth/Throat: Oropharynx is clear and moist.  Eyes: Conjunctivae are normal.  Neck: Normal range of motion. Neck supple.  Cardiovascular: Normal rate, regular rhythm and normal heart sounds.   Pulmonary/Chest: Effort normal. He  has wheezes.       Wheezing noted to the left base.  Musculoskeletal: Normal range of motion.  Neurological: He is alert and oriented to person, place, and time.  Skin: Skin is warm and dry.  Psychiatric: He has a normal mood and affect.          Assessment & Plan:  Assessment: Likely Mycoplasma pneumonia, cough, fever  Plan: Ceftin 250 mg one tablet by mouth twice a day x10 days. Robitussin-AC 1 teaspoon every 8 hours when necessary. Alternate ibuprofen and Tylenol when necessary for fever, aches, and pains. Rest. Drink plenty of fluids. Call if symptoms worsen or persist. Recheck as scheduled and when necessary

## 2012-07-09 ENCOUNTER — Other Ambulatory Visit: Payer: Self-pay | Admitting: Internal Medicine

## 2012-08-17 ENCOUNTER — Other Ambulatory Visit (INDEPENDENT_AMBULATORY_CARE_PROVIDER_SITE_OTHER): Payer: BC Managed Care – PPO

## 2012-08-17 DIAGNOSIS — Z0000 Encounter for general adult medical examination without abnormal findings: Secondary | ICD-10-CM

## 2012-08-17 LAB — POCT URINALYSIS DIPSTICK
Glucose, UA: NEGATIVE
Ketones, UA: NEGATIVE
Protein, UA: NEGATIVE
Spec Grav, UA: 1.015

## 2012-08-17 LAB — HEPATIC FUNCTION PANEL
ALT: 21 U/L (ref 0–53)
AST: 19 U/L (ref 0–37)
Bilirubin, Direct: 0.2 mg/dL (ref 0.0–0.3)
Total Bilirubin: 0.7 mg/dL (ref 0.3–1.2)

## 2012-08-17 LAB — CBC WITH DIFFERENTIAL/PLATELET
Basophils Relative: 0.7 % (ref 0.0–3.0)
Eosinophils Absolute: 0.3 10*3/uL (ref 0.0–0.7)
Hemoglobin: 15.2 g/dL (ref 13.0–17.0)
Lymphs Abs: 1.1 10*3/uL (ref 0.7–4.0)
MCHC: 33.4 g/dL (ref 30.0–36.0)
MCV: 86.8 fl (ref 78.0–100.0)
Monocytes Absolute: 0.5 10*3/uL (ref 0.1–1.0)
Neutro Abs: 3.5 10*3/uL (ref 1.4–7.7)
RBC: 5.26 Mil/uL (ref 4.22–5.81)

## 2012-08-17 LAB — LIPID PANEL
Cholesterol: 204 mg/dL — ABNORMAL HIGH (ref 0–200)
VLDL: 17.8 mg/dL (ref 0.0–40.0)

## 2012-08-17 LAB — BASIC METABOLIC PANEL
BUN: 14 mg/dL (ref 6–23)
Chloride: 103 mEq/L (ref 96–112)
Creatinine, Ser: 0.9 mg/dL (ref 0.4–1.5)
GFR: 87.18 mL/min (ref >60.00)

## 2012-08-19 ENCOUNTER — Other Ambulatory Visit: Payer: Self-pay | Admitting: Internal Medicine

## 2012-08-20 NOTE — Telephone Encounter (Signed)
Rx sent to pharmacy   

## 2012-08-20 NOTE — Telephone Encounter (Signed)
ok 

## 2012-08-25 ENCOUNTER — Ambulatory Visit (INDEPENDENT_AMBULATORY_CARE_PROVIDER_SITE_OTHER): Payer: BC Managed Care – PPO | Admitting: Internal Medicine

## 2012-08-25 ENCOUNTER — Encounter: Payer: Self-pay | Admitting: Internal Medicine

## 2012-08-25 VITALS — BP 130/80 | HR 60 | Temp 97.9°F | Resp 16 | Ht 75.5 in | Wt 238.0 lb

## 2012-08-25 DIAGNOSIS — M1990 Unspecified osteoarthritis, unspecified site: Secondary | ICD-10-CM

## 2012-08-25 DIAGNOSIS — I10 Essential (primary) hypertension: Secondary | ICD-10-CM

## 2012-08-25 DIAGNOSIS — Z0000 Encounter for general adult medical examination without abnormal findings: Secondary | ICD-10-CM

## 2012-08-25 DIAGNOSIS — J45909 Unspecified asthma, uncomplicated: Secondary | ICD-10-CM

## 2012-08-25 DIAGNOSIS — Z23 Encounter for immunization: Secondary | ICD-10-CM

## 2012-08-25 NOTE — Progress Notes (Signed)
Patient ID: Charles Mccoy, male   DOB: 15-Dec-1950, 61 y.o.   MRN: [id number]  Subjective:    Patient ID: Charles Mccoy, male    DOB: 1951-02-11, 61 y.o.   MRN: [id number]  Hypertension Associated symptoms include shortness of breath. Pertinent negatives include no chest pain, headaches, neck pain or palpitations.   History of Present Illness:   61 year-old patient has a history of asthma and allergic rhinitis, who is seen today for an annual physical. He has hypertension, which has been stable. Also has a history of osteoarthritis involving especially the right knee  Allergies:  1) ! Ery-Tab (Erythromycin Base)   Past History:  Past Medical History:  Allergic rhinitis  Hypertension  Asthma ( Immunotherapy)  Osteoarthritis    Past Surgical History:  Reviewed history from 03/21/2008 and no changes required.  colonoscopy January 2007   Family History:  Reviewed history from 09/29/2007 and no changes required.  father died at age 45 laryngeal cancer  mother died age 19  One older brother and sister are both in good health   Social History:  Reviewed history from 09/29/2007 and no changes required.  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  HENT: Positive for congestion and rhinorrhea. Negative for hearing loss, ear pain, sneezing, mouth sores, trouble swallowing, neck pain, neck stiffness, dental problem, voice change, sinus pressure and tinnitus.   Eyes: Negative for photophobia, pain, redness and visual disturbance.  Respiratory: Positive for cough and shortness of breath. Negative for apnea, choking, chest tightness and wheezing.   Cardiovascular: Negative for chest pain, palpitations and leg swelling.  Gastrointestinal: Negative for nausea, vomiting, abdominal pain, diarrhea, constipation, blood in stool, abdominal  distention, anal bleeding and rectal pain.  Genitourinary: Negative for dysuria, urgency, frequency, hematuria, flank pain, decreased urine volume, discharge, penile swelling, scrotal swelling, difficulty urinating, genital sores and testicular pain.  Musculoskeletal: Negative for myalgias, back pain, joint swelling, arthralgias and gait problem.  Skin: Negative for color change, rash and wound.  Neurological: Negative for dizziness, tremors, seizures, syncope, facial asymmetry, speech difficulty, weakness, light-headedness, numbness and headaches.  Hematological: Negative for adenopathy. Does not bruise/bleed easily.  Psychiatric/Behavioral: Negative for suicidal ideas, hallucinations, behavioral problems, confusion, sleep disturbance, self-injury, dysphoric mood, decreased concentration and agitation. The patient is not nervous/anxious.        Objective:   Physical Exam  Constitutional: He appears well-developed and well-nourished.  HENT:  Head: Normocephalic and atraumatic.  Right Ear: External ear normal.  Left Ear: External ear normal.  Nose: Nose normal.  Mouth/Throat: Oropharynx is clear and moist.  Eyes: Conjunctivae normal and EOM are normal. Pupils are equal, round, and reactive to light. No scleral icterus.  Neck: Normal range of motion. Neck supple. No JVD present. No thyromegaly present.  Cardiovascular: Regular rhythm, normal heart sounds and intact distal pulses.  Exam reveals no gallop and no friction rub.   No murmur heard. Pulmonary/Chest: Effort normal and breath sounds normal. He exhibits no tenderness.  Abdominal: Soft. Bowel sounds are normal. He exhibits no distension and no mass. There is no tenderness.  Genitourinary: Prostate normal and penis normal. Guaiac negative stool.        prostate +2 enlarged  Musculoskeletal: Normal range of motion. He exhibits no edema and no tenderness.       Right knee with  valgus deformity and warm to touch  Lymphadenopathy:    He  has no cervical adenopathy.  Neurological: He is alert. He has normal reflexes. No cranial nerve deficit. Coordination normal.  Skin: Skin is warm and dry. No rash noted.  Psychiatric: He has a normal mood and affect. His behavior is normal.          Assessment & Plan:     preventive health examination  Asthma stable  Hypertension well controlled. We'll continue present regimen and home blood pressure monitoring   Recheck one year  Followup allergy

## 2012-08-25 NOTE — Patient Instructions (Signed)

## 2012-10-06 ENCOUNTER — Other Ambulatory Visit: Payer: Self-pay | Admitting: Internal Medicine

## 2012-11-03 ENCOUNTER — Encounter: Payer: Self-pay | Admitting: Internal Medicine

## 2012-12-22 ENCOUNTER — Encounter: Payer: Self-pay | Admitting: Internal Medicine

## 2012-12-22 ENCOUNTER — Ambulatory Visit (INDEPENDENT_AMBULATORY_CARE_PROVIDER_SITE_OTHER): Payer: BC Managed Care – PPO | Admitting: Internal Medicine

## 2012-12-22 ENCOUNTER — Telehealth: Payer: Self-pay | Admitting: Internal Medicine

## 2012-12-22 VITALS — BP 140/90 | HR 74 | Temp 98.3°F | Resp 18 | Wt 240.0 lb

## 2012-12-22 DIAGNOSIS — I10 Essential (primary) hypertension: Secondary | ICD-10-CM

## 2012-12-22 DIAGNOSIS — B029 Zoster without complications: Secondary | ICD-10-CM

## 2012-12-22 NOTE — Patient Instructions (Signed)
Complete 10 days of Valtrex  Limit your sodium (Salt) intake  Please check your blood pressure on a regular basis.  If it is consistently greater than 150/90, please make an office appointment.

## 2012-12-22 NOTE — Progress Notes (Signed)
  Subjective:    Patient ID: Charles Mccoy, male    DOB: [birth year], 62 y.o.   MRN: [id number]  HPI  62 year old patient who has treated hypertension. [place] was diagnosed with shingles involving the facial area. He was started on antiviral medication 4 days ago and continues to improve. He has treated hypertension which has been stable  Past Medical History  Diagnosis Date  . ALLERGIC RHINITIS 06/19/2007  . ASTHMA 08/17/2008  . HYPERTENSION 06/19/2007  . OSTEOARTHRITIS 10/09/2009  . SINUSITIS 09/15/2007    History   Social History  . Marital Status: Married    Spouse Name: N/A    Number of Children: N/A  . Years of Education: N/A   Occupational History  . Not on file.   Social History Main Topics  . Smoking status: Former Smoker    Types: Cigarettes    Quit date: 10/07/2001  . Smokeless tobacco: Never Used  . Alcohol Use: Yes  . Drug Use: No  . Sexually Active: Not on file   Other Topics Concern  . Not on file   Social History Narrative  . No narrative on file    History reviewed. No pertinent past surgical history.  No family history on file.  Allergies  Allergen Reactions  . Erythromycin Base     REACTION: stomach irritation    Current Outpatient Prescriptions on File Prior to Visit  Medication Sig Dispense Refill  . albuterol (PROAIR HFA) 108 (90 BASE) MCG/ACT inhaler Inhale 2 puffs into the lungs every 6 (six) hours as needed.  1 Inhaler  6  . amLODipine (NORVASC) 5 MG tablet TAKE 1 TABLET (5 MG TOTAL) BY MOUTH DAILY.  90 tablet  0  . CELEBREX 200 MG capsule TAKE 1 CAPSULE (200 MG TOTAL) BY MOUTH DAILY.  45 capsule  5  . Fluticasone-Salmeterol (ADVAIR DISKUS) 250-50 MCG/DOSE AEPB Inhale 1 puff into the lungs every 12 (twelve) hours.  60 each  6  . levocetirizine (XYZAL) 5 MG tablet       . montelukast (SINGULAIR) 10 MG tablet TAKE 1 TABLET (10 MG TOTAL) BY MOUTH AT BEDTIME.  90 tablet  0  . omeprazole (PRILOSEC) 20 MG capsule       . sildenafil  (VIAGRA) 100 MG tablet Take 1 tablet (100 mg total) by mouth daily as needed.  10 tablet  6  . spironolactone (ALDACTONE) 25 MG tablet TAKE 1 TABLET (25 MG TOTAL) BY MOUTH DAILY.  90 tablet  0  . vardenafil (LEVITRA) 20 MG tablet Take 1 tablet (20 mg total) by mouth daily as needed.  10 tablet  6   No current facility-administered medications on file prior to visit.    BP 140/90  Pulse 74  Temp(Src) 98.3 F (36.8 C) (Oral)  Resp 18  Wt 240 lb (108.863 kg)  BMI 29.59 kg/m2  SpO2 96%       Review of Systems  Skin: Positive for rash.       Objective:   Physical Exam  Constitutional: He appears well-developed and well-nourished. No distress.  Repeat blood pressure 120/76  Skin:  Mild resolving herpetic rash in the first division of the left fifth cranial nerve with a few scattered dry crusted lesions no eye involvement           Assessment & Plan:   Shingles left CN V 1 division. We'll complete the 7 days of Valtrex Hypertension stable  Return in 6 months for general followup

## 2012-12-22 NOTE — Telephone Encounter (Signed)
Patient Information:  Caller Name: Kasten  Phone: [phone]  Patient: Charles Mccoy, Charles Mccoy  Gender: Male  DOB: 1951/05/17  Age: 62 Years  PCP: Eleonore Chiquito [facility])  Office Follow Up:  Does the office need to follow up with this patient?: No  Instructions For The Office: N/A   Symptoms  Reason For Call & Symptoms: Patient has bump on the Left side of his forehead in the hairline. First noted on 12/09/12. He could not see anything at first. "Just felt it".  He thought maybe he bumped his head...as time progressed it became worse. The appearance was red and circular but not raised.   He was in [place], 12/16/12 [place] [facility]  and was told the area was infected and was placed Minocycline 100mg  bid and neosporin.  It has continued to improve.  However , he developed a spot below it . It is on his forehead above left eye and now outer part of eye lid.  They were concerned this was Shingles . He was placed on Valcyclovir 500mg  bid.Marland Kitchen He states it is not painful and continuing to improve on daily basis , slight itching, no vision issues. He wants Dr.K to see it and confirm diagnosis.  Reviewed Health History In EMR: Yes  Reviewed Medications In EMR: Yes  Reviewed Allergies In EMR: Yes  Reviewed Surgeries / Procedures: No  Date of Onset of Symptoms: 12/09/2012  Treatments Tried: Minocycline and Valcyclovir, Tylenol  Treatments Tried Worked: No  Guideline(s) Used:  Rash or Redness - Localized  Disposition Per Guideline:   See Today in Office  Reason For Disposition Reached:   Patient wants to be seen  Advice Given:  Avoid Scratching:  Try not to scratch. Cut your fingernails short.  Contagiousness:  Adults with localized rashes do not need to miss any work or school.  Avoid the Cause:   Try to find the cause. Consider irritants like a plant (e.g., poison ivy or evergreens), chemicals (e.g., solvents or insecticides), fiberglass, a new cosmetic, or new jewelry  (called contact dermatitis). A pet may be carrying the irritating substance (e.g., with poison ivy or poison oak).  Avoid Soap:  Wash the area once thoroughly with soap to remove any remaining irritants. Thereafter avoid soaps to this area. Cleanse the area when needed with warm water.  Call Back If:  Rash spreads or becomes worse  Patient Will Follow Care Advice:  YES  Appointment Scheduled:  12/22/2012 15:15:00 Appointment Scheduled Provider:  Eleonore Chiquito [facility])

## 2013-01-15 ENCOUNTER — Other Ambulatory Visit: Payer: Self-pay | Admitting: Internal Medicine

## 2013-01-31 ENCOUNTER — Other Ambulatory Visit: Payer: Self-pay | Admitting: Internal Medicine

## 2013-05-07 ENCOUNTER — Other Ambulatory Visit: Payer: Self-pay | Admitting: Internal Medicine

## 2013-05-31 ENCOUNTER — Other Ambulatory Visit: Payer: Self-pay | Admitting: Internal Medicine

## 2013-05-31 ENCOUNTER — Encounter: Payer: Self-pay | Admitting: Internal Medicine

## 2013-08-18 ENCOUNTER — Other Ambulatory Visit: Payer: Self-pay | Admitting: Internal Medicine

## 2013-08-30 ENCOUNTER — Other Ambulatory Visit (INDEPENDENT_AMBULATORY_CARE_PROVIDER_SITE_OTHER): Payer: BC Managed Care – PPO

## 2013-08-30 DIAGNOSIS — Z0000 Encounter for general adult medical examination without abnormal findings: Secondary | ICD-10-CM

## 2013-08-30 LAB — CBC WITH DIFFERENTIAL/PLATELET
Basophils Relative: 0.4 % (ref 0.0–3.0)
Eosinophils Relative: 4.2 % (ref 0.0–5.0)
HCT: 43.7 % (ref 39.0–52.0)
Lymphs Abs: 1.4 10*3/uL (ref 0.7–4.0)
MCV: 85.9 fl (ref 78.0–100.0)
Monocytes Absolute: 0.7 10*3/uL (ref 0.1–1.0)
Monocytes Relative: 14.3 % — ABNORMAL HIGH (ref 3.0–12.0)
Neutro Abs: 2.7 10*3/uL (ref 1.4–7.7)
Platelets: 245 10*3/uL (ref 150.0–400.0)
RBC: 5.09 Mil/uL (ref 4.22–5.81)
WBC: 5 10*3/uL (ref 4.5–10.5)

## 2013-08-30 LAB — BASIC METABOLIC PANEL
BUN: 18 mg/dL (ref 6–23)
CO2: 28 mEq/L (ref 19–32)
Calcium: 8.8 mg/dL (ref 8.4–10.5)
Creatinine, Ser: 0.9 mg/dL (ref 0.4–1.5)
GFR: 87.96 mL/min (ref >60.00)
Glucose, Bld: 80 mg/dL (ref 70–99)

## 2013-08-30 LAB — HEPATIC FUNCTION PANEL
AST: 20 U/L (ref 0–37)
Alkaline Phosphatase: 46 U/L (ref 39–117)
Bilirubin, Direct: 0.2 mg/dL (ref 0.0–0.3)
Total Bilirubin: 1.1 mg/dL (ref 0.3–1.2)
Total Protein: 6.8 g/dL (ref 6.0–8.3)

## 2013-08-30 LAB — TSH: TSH: 1.13 u[IU]/mL (ref 0.35–5.50)

## 2013-08-30 LAB — POCT URINALYSIS DIPSTICK
Bilirubin, UA: NEGATIVE
Glucose, UA: NEGATIVE
Leukocytes, UA: NEGATIVE

## 2013-08-30 LAB — PSA: PSA: 0.54 ng/mL (ref 0.10–4.00)

## 2013-09-06 ENCOUNTER — Ambulatory Visit (INDEPENDENT_AMBULATORY_CARE_PROVIDER_SITE_OTHER): Payer: BC Managed Care – PPO | Admitting: Internal Medicine

## 2013-09-06 ENCOUNTER — Encounter: Payer: Self-pay | Admitting: Internal Medicine

## 2013-09-06 VITALS — BP 146/90 | HR 72 | Temp 98.0°F | Resp 20 | Ht 74.5 in | Wt 243.0 lb

## 2013-09-06 DIAGNOSIS — Z23 Encounter for immunization: Secondary | ICD-10-CM

## 2013-09-06 DIAGNOSIS — I10 Essential (primary) hypertension: Secondary | ICD-10-CM

## 2013-09-06 MED ORDER — HYDROCORTISONE 2.5 % RE CREA: 1.0000 "application " | TOPICAL_CREAM | Freq: Two times a day (BID) | RECTAL | Status: DC

## 2013-09-06 NOTE — Patient Instructions (Signed)
Return in one year for follow-up  Hemorrhoids Hemorrhoids are swollen veins around the rectum or anus. There are two types of hemorrhoids:   Internal hemorrhoids. These occur in the veins just inside the rectum. They may poke through to the outside and become irritated and painful.  External hemorrhoids. These occur in the veins outside the anus and can be felt as a painful swelling or hard lump near the anus. CAUSES  Pregnancy.   Obesity.   Constipation or diarrhea.   Straining to have a bowel movement.   Sitting for long periods on the toilet.  Heavy lifting or other activity that caused you to strain.  Anal intercourse. SYMPTOMS   Pain.   Anal itching or irritation.   Rectal bleeding.   Fecal leakage.   Anal swelling.   One or more lumps around the anus.  DIAGNOSIS  Your caregiver may be able to diagnose hemorrhoids by visual examination. Other examinations or tests that may be performed include:   Examination of the rectal area with a gloved hand (digital rectal exam).   Examination of anal canal using a small tube (scope).   A blood test if you have lost a significant amount of blood.  A test to look inside the colon (sigmoidoscopy or colonoscopy). TREATMENT Most hemorrhoids can be treated at home. However, if symptoms do not seem to be getting better or if you have a lot of rectal bleeding, your caregiver may perform a procedure to help make the hemorrhoids get smaller or remove them completely. Possible treatments include:   Placing a rubber band at the base of the hemorrhoid to cut off the circulation (rubber band ligation).   Injecting a chemical to shrink the hemorrhoid (sclerotherapy).   Using a tool to burn the hemorrhoid (infrared light therapy).   Surgically removing the hemorrhoid (hemorrhoidectomy).   Stapling the hemorrhoid to block blood flow to the tissue (hemorrhoid stapling).  HOME CARE INSTRUCTIONS   Eat foods with  fiber, such as whole grains, beans, nuts, fruits, and vegetables. Ask your doctor about taking products with added fiber in them (fibersupplements).  Increase fluid intake. Drink enough water and fluids to keep your urine clear or pale yellow.   Exercise regularly.   Go to the bathroom when you have the urge to have a bowel movement. Do not wait.   Avoid straining to have bowel movements.   Keep the anal area dry and clean. Use wet toilet paper or moist towelettes after a bowel movement.   Medicated creams and suppositories may be used or applied as directed.   Only take over-the-counter or prescription medicines as directed by your caregiver.   Take warm sitz baths for 15 20 minutes, 3 4 times a day to ease pain and discomfort.   Place ice packs on the hemorrhoids if they are tender and swollen. Using ice packs between sitz baths may be helpful.   Put ice in a plastic bag.   Place a towel between your skin and the bag.   Leave the ice on for 15 20 minutes, 3 4 times a day.   Do not use a donut-shaped pillow or sit on the toilet for long periods. This increases blood pooling and pain.  SEEK MEDICAL CARE IF:  You have increasing pain and swelling that is not controlled by treatment or medicine.  You have uncontrolled bleeding.  You have difficulty or you are unable to have a bowel movement.  You have pain or inflammation outside the  area of the hemorrhoids. MAKE SURE YOU:  Understand these instructions.  Will watch your condition.  Will get help right away if you are not doing well or get worse. Document Released: 09/20/2000 Document Revised: 09/09/2012 Document Reviewed: 07/28/2012 [facility] Patient Information 2014 [place], [place].

## 2013-09-06 NOTE — Progress Notes (Signed)
Pre-visit discussion using our clinic review tool. No additional management support is needed unless otherwise documented below in the visit note.  

## 2013-09-06 NOTE — Progress Notes (Signed)
Patient ID: Charles Mccoy, male   DOB: 25-Feb-1951, 62 y.o.   MRN: [id number]  Subjective:    Patient ID: Charles Mccoy, male    DOB: Mar 30, 1951, 62 y.o.   MRN: [id number]  Hypertension Associated symptoms include shortness of breath. Pertinent negatives include no chest pain, headaches, neck pain or palpitations.   Pre-visit discussion using our clinic review tool. No additional management support is needed unless otherwise documented below in the visit note.   History of Present Illness:   62 year-old patient has a history of asthma and allergic rhinitis, who is seen today for an annual physical. He has hypertension, which has been stable. Also has a history of osteoarthritis involving especially the right knee  Allergies:  1) ! Ery-Tab (Erythromycin Base)   Past History:   Allergic rhinitis  Hypertension  Asthma ( Immunotherapy)  Osteoarthritis    Past Surgical History:    colonoscopy January 2007   Family History:   father died at age 45 laryngeal cancer  mother died age 67  One older brother and sister are both in good health   Social History:   married two adult children are in good health  Works in the Animal nutritionist as a Futures trader for [employer]     Review of Systems  Constitutional: Negative for fever, chills, activity change, appetite change and fatigue.  HENT: Positive for congestion and rhinorrhea. Negative for dental problem, ear pain, hearing loss, mouth sores, sinus pressure, sneezing, tinnitus, trouble swallowing and voice change.   Eyes: Negative for photophobia, pain, redness and visual disturbance.  Respiratory: Positive for cough and shortness of breath. Negative for apnea, choking, chest tightness and wheezing.   Cardiovascular: Negative for chest pain, palpitations and leg swelling.  Gastrointestinal: Negative for nausea, vomiting, abdominal pain, diarrhea, constipation, blood in stool, abdominal distention, anal bleeding and rectal pain.  Genitourinary: Negative for  dysuria, urgency, frequency, hematuria, flank pain, decreased urine volume, discharge, penile swelling, scrotal swelling, difficulty urinating, genital sores and testicular pain.  Musculoskeletal: Negative for arthralgias, back pain, gait problem, joint swelling, myalgias, neck pain and neck stiffness.  Skin: Negative for color change, rash and wound.  Neurological: Negative for dizziness, tremors, seizures, syncope, facial asymmetry, speech difficulty, weakness, light-headedness, numbness and headaches.  Hematological: Negative for adenopathy. Does not bruise/bleed easily.  Psychiatric/Behavioral: Negative for suicidal ideas, hallucinations, behavioral problems, confusion, sleep disturbance, self-injury, dysphoric mood, decreased concentration and agitation. The patient is not nervous/anxious.        Objective:   Physical Exam  Constitutional: He appears well-developed and well-nourished.  HENT:  Head: Normocephalic and atraumatic.  Right Ear: External ear normal.  Left Ear: External ear normal.  Nose: Nose normal.  Mouth/Throat: Oropharynx is clear and moist.  Eyes: Conjunctivae and EOM are normal. Pupils are equal, round, and reactive to light. No scleral icterus.  Neck: Normal range of motion. Neck supple. No JVD present. No thyromegaly present.  Cardiovascular: Regular rhythm, normal heart sounds and intact distal pulses.  Exam reveals no gallop and no friction rub.   No murmur heard. Pulmonary/Chest: Effort normal and breath sounds normal. He exhibits no tenderness.  Abdominal: Soft. Bowel sounds are normal. He exhibits no distension and no mass. There is no tenderness.  Genitourinary: Prostate normal and penis normal. Guaiac negative stool.   prostate +2 enlarged External hemorrhoid noted Heme-negative stool  Musculoskeletal: Normal range of motion. He exhibits no edema and no tenderness.  Right knee with valgus deformity and warm to touch  Lymphadenopathy:  He has no cervical  adenopathy.  Neurological: He is alert. He has normal reflexes. No cranial nerve deficit. Coordination normal.  Skin: Skin is warm and dry. No rash noted.  Psychiatric: He has a normal mood and affect. His behavior is normal.          Assessment & Plan:    Preventive health examination  Asthma stable  Hypertension well controlled. We'll continue present regimen and home blood pressure monitoring   Recheck one year  Followup allergy

## 2013-11-29 ENCOUNTER — Telehealth: Payer: Self-pay | Admitting: Internal Medicine

## 2013-11-30 NOTE — Telephone Encounter (Signed)
Opened in error

## 2014-01-06 ENCOUNTER — Ambulatory Visit (INDEPENDENT_AMBULATORY_CARE_PROVIDER_SITE_OTHER): Payer: BC Managed Care – PPO | Admitting: Internal Medicine

## 2014-01-06 ENCOUNTER — Encounter: Payer: Self-pay | Admitting: Internal Medicine

## 2014-01-06 VITALS — BP 168/92 | HR 69 | Temp 98.5°F | Resp 20 | Ht 74.5 in | Wt 246.0 lb

## 2014-01-06 DIAGNOSIS — I10 Essential (primary) hypertension: Secondary | ICD-10-CM

## 2014-01-06 MED ORDER — AMLODIPINE BESYLATE 10 MG PO TABS: 10.0000 mg | ORAL_TABLET | Freq: Every day | ORAL | Status: DC

## 2014-01-06 MED ORDER — TAMSULOSIN HCL 0.4 MG PO CAPS: 0.4000 mg | ORAL_CAPSULE | Freq: Every day | ORAL | Status: DC

## 2014-01-06 NOTE — Progress Notes (Signed)
Pre-visit discussion using our clinic review tool. No additional management support is needed unless otherwise documented below in the visit note.  

## 2014-01-06 NOTE — Patient Instructions (Signed)
Limit your sodium (Salt) intake  Moderate your fluid intake after your evening meal  Please check your blood pressure on a regular basis.  If it is consistently greater than 150/90, please make an office appointment.   Increase amlodipine to 10 mg daily

## 2014-01-06 NOTE — Progress Notes (Signed)
Subjective:    Patient ID: Charles Mccoy, male    DOB: 1951-10-07, 63 y.o.   MRN: [id number]  HPI  63 year old patient who has treated hypertension.  He has been on Aldactone, as well as amlodipine.  For the past few weeks, he has noted frequent blood pressure elevations with systolics often greater than 160.  He generally feels well, but does admit to some work related stress.  He has asthma, which has been stable.  BP Readings from Last 3 Encounters:  01/06/14 168/92  09/06/13 146/90  12/22/12 140/90    Past Medical History  Diagnosis Date  . ALLERGIC RHINITIS 06/19/2007  . ASTHMA 08/17/2008  . HYPERTENSION 06/19/2007  . OSTEOARTHRITIS 10/09/2009  . SINUSITIS 09/15/2007    History   Social History  . Marital Status: Married    Spouse Name: N/A    Number of Children: N/A  . Years of Education: N/A   Occupational History  . Not on file.   Social History Main Topics  . Smoking status: Former Smoker    Types: Cigarettes    Quit date: 10/07/2001  . Smokeless tobacco: Never Used  . Alcohol Use: Yes  . Drug Use: No  . Sexual Activity: Not on file   Other Topics Concern  . Not on file   Social History Narrative  . No narrative on file    History reviewed. No pertinent past surgical history.  No family history on file.  Allergies  Allergen Reactions  . Erythromycin Base     REACTION: stomach irritation    Current Outpatient Prescriptions on File Prior to Visit  Medication Sig Dispense Refill  . albuterol (PROAIR HFA) 108 (90 BASE) MCG/ACT inhaler Inhale 2 puffs into the lungs every 6 (six) hours as needed.  1 Inhaler  6  . CELEBREX 200 MG capsule TAKE 1 CAPSULE (200 MG TOTAL) BY MOUTH DAILY.  90 capsule  1  . Fluticasone-Salmeterol (ADVAIR DISKUS) 250-50 MCG/DOSE AEPB Inhale 1 puff into the lungs every 12 (twelve) hours.  60 each  6  . hydrocortisone (ANUSOL-HC) 2.5 % rectal cream Place 1 application rectally 2 (two) times daily.  30 g  2  . levocetirizine (XYZAL)  5 MG tablet       . montelukast (SINGULAIR) 10 MG tablet TAKE 1 TABLET (10 MG TOTAL) BY MOUTH AT BEDTIME.  90 tablet  1  . omeprazole (PRILOSEC) 20 MG capsule       . spironolactone (ALDACTONE) 25 MG tablet TAKE 1 TABLET EVERY DAY  90 tablet  1  . vardenafil (LEVITRA) 20 MG tablet Take 1 tablet (20 mg total) by mouth daily as needed.  10 tablet  6   No current facility-administered medications on file prior to visit.    BP 168/92  Pulse 69  Temp(Src) 98.5 F (36.9 C) (Oral)  Resp 20  Ht 6' 2.5" (1.892 m)  Wt 246 lb (111.585 kg)  BMI 31.17 kg/m2  SpO2 95%     Review of Systems  Constitutional: Negative for fever, chills, appetite change and fatigue.  HENT: Negative for congestion, dental problem, ear pain, hearing loss, sore throat, tinnitus, trouble swallowing and voice change.   Eyes: Negative for pain, discharge and visual disturbance.  Respiratory: Negative for cough, chest tightness, wheezing and stridor.   Cardiovascular: Negative for chest pain, palpitations and leg swelling.  Gastrointestinal: Negative for nausea, vomiting, abdominal pain, diarrhea, constipation, blood in stool and abdominal distention.  Genitourinary: Negative for urgency, hematuria, flank pain, discharge,  difficulty urinating and genital sores.  Musculoskeletal: Negative for arthralgias, back pain, gait problem, joint swelling, myalgias and neck stiffness.  Skin: Negative for rash.  Neurological: Negative for dizziness, syncope, speech difficulty, weakness, numbness and headaches.  Hematological: Negative for adenopathy. Does not bruise/bleed easily.  Psychiatric/Behavioral: Negative for behavioral problems and dysphoric mood. The patient is not nervous/anxious.        Objective:   Physical Exam  Constitutional: He is oriented to person, place, and time. He appears well-developed.  Blood pressure on arrival, 168 over 92 Blood pressure by my exam 140 over 80  HENT:  Head: Normocephalic.  Right  Ear: External ear normal.  Left Ear: External ear normal.  Eyes: Conjunctivae and EOM are normal.  Neck: Normal range of motion.  Cardiovascular: Normal rate and normal heart sounds.   Pulmonary/Chest: Breath sounds normal.  Abdominal: Bowel sounds are normal.  Musculoskeletal: Normal range of motion. He exhibits no edema and no tenderness.  Neurological: He is alert and oriented to person, place, and time.  Psychiatric: He has a normal mood and affect. His behavior is normal.          Assessment & Plan:   Hypertension.  Suboptimal control.  We'll increase amlodipine to 10 mg daily.  Additional salt restriction encouraged.  Home blood pressure monitor and recommended. Asthma stable at

## 2014-01-10 ENCOUNTER — Telehealth: Payer: Self-pay | Admitting: Internal Medicine

## 2014-01-10 NOTE — Telephone Encounter (Signed)
Relevant patient education assigned to patient using [facility]. ° °

## 2014-03-08 ENCOUNTER — Telehealth: Payer: Self-pay | Admitting: Internal Medicine

## 2014-03-08 NOTE — Telephone Encounter (Signed)
Pt change mind will wchedule appt ck

## 2014-03-10 ENCOUNTER — Ambulatory Visit (INDEPENDENT_AMBULATORY_CARE_PROVIDER_SITE_OTHER): Payer: BC Managed Care – PPO | Admitting: Internal Medicine

## 2014-03-10 ENCOUNTER — Other Ambulatory Visit: Payer: Self-pay | Admitting: Internal Medicine

## 2014-03-10 ENCOUNTER — Encounter: Payer: Self-pay | Admitting: Internal Medicine

## 2014-03-10 ENCOUNTER — Ambulatory Visit: Payer: BC Managed Care – PPO | Admitting: Internal Medicine

## 2014-03-10 VITALS — BP 150/90 | HR 64 | Temp 98.5°F | Resp 20 | Ht 74.5 in | Wt 245.0 lb

## 2014-03-10 DIAGNOSIS — B360 Pityriasis versicolor: Secondary | ICD-10-CM

## 2014-03-10 DIAGNOSIS — I10 Essential (primary) hypertension: Secondary | ICD-10-CM

## 2014-03-10 DIAGNOSIS — J45909 Unspecified asthma, uncomplicated: Secondary | ICD-10-CM

## 2014-03-10 MED ORDER — KETOCONAZOLE 2 % EX SHAM: MEDICATED_SHAMPOO | CUTANEOUS | Status: DC

## 2014-03-10 MED ORDER — KETOCONAZOLE 2 % EX SHAM: 1.0000 "application " | MEDICATED_SHAMPOO | CUTANEOUS | Status: DC

## 2014-03-10 NOTE — Progress Notes (Signed)
Pre-visit discussion using our clinic review tool. No additional management support is needed unless otherwise documented below in the visit note.  

## 2014-03-10 NOTE — Progress Notes (Signed)
   Subjective:    Patient ID: Charles Mccoy, male    DOB: 1950-12-03, 63 y.o.   MRN: [id number]  HPI 50 -year-old patient who has a history of hypertension and asthma.  He presents with a several week history of a rash involving the posterior neck, arms, and anterior chest.  Intermittently, he has had a bit of the rash in the groin region as well   Review of Systems  Skin: Positive for rash.       Objective:   Physical Exam  Constitutional: He appears well-developed and well-nourished.  Skin:  Patchy areas of erythema and scaling involving the posterior neck and anterior chest.  There is hypopigmentation and scaliness noted over the outer aspects of both lower arms          Assessment & Plan:   Tinea versicolor.  We'll treat with Nizoral shampoo and uses a body wash for 10-14 days.  We'll consider twice weekly preventative shampoo Hypertension.  Blood pressure remains in high normal range.  We'll continue present regimen

## 2014-03-10 NOTE — Patient Instructions (Addendum)
Use ketoconazole shampoo/body wash daily for 2 weeks.  Consider daily or twice weekly use of a selenium sulfide based shampooTinea Versicolor Tinea versicolor is a common yeast infection of the skin. This condition becomes known when the yeast on our skin starts to overgrow (yeast is a normal inhabitant on our skin). This condition is noticed as white or light brown patches on brown skin, and is more evident in the summer on tanned skin. These areas are slightly scaly if scratched. The light patches from the yeast become evident when the yeast creates "holes in your suntan". This is most often noticed in the summer. The patches are usually located on the chest, back, pubis, neck and body folds. However, it may occur on any area of body. Mild itching and inflammation (redness or soreness) may be present. DIAGNOSIS  The diagnosisof this is made clinically (by looking). Cultures from samples are usually not needed. Examination under the microscope may help. However, yeast is normally found on skin. The diagnosis still remains clinical. Examination under Wood's Ultraviolet Light can determine the extent of the infection. TREATMENT  This common infection is usually only of cosmetic (only a concern to your appearance). It is easily treated with dandruff shampoo used during showers or bathing. Vigorous scrubbing will eliminate the yeast over several days time. The light areas in your skin may remain for weeks or months after the infection is cured unless your skin is exposed to sunlight. The lighter or darker spots caused by the fungus that remain after complete treatment are not a sign of treatment failure; it will take a long time to resolve. Your caregiver may recommend a number of commercial preparations or medication by mouth if home care is not working. Recurrence is common and preventative medication may be necessary. This skin condition is not highly contagious. Special care is not needed to protect close  friends and family members. Normal hygiene is usually enough. Follow up is required only if you develop complications (such as a secondary infection from scratching), if recommended by your caregiver, or if no relief is obtained from the preparations used. Document Released: 09/20/2000 Document Revised: 12/16/2011 Document Reviewed: 11/02/2008 [facility] Patient Information 2014 [place], [place].

## 2014-04-10 ENCOUNTER — Other Ambulatory Visit: Payer: Self-pay | Admitting: Internal Medicine

## 2014-04-27 ENCOUNTER — Other Ambulatory Visit: Payer: Self-pay | Admitting: Internal Medicine

## 2014-05-31 ENCOUNTER — Telehealth: Payer: Self-pay | Admitting: Internal Medicine

## 2014-05-31 NOTE — Telephone Encounter (Signed)
Pt will be having knee surgery and cannot get appt until they received paperwork back from dr k for clearance.  Pt would like to know if you have received this paperwork.

## 2014-06-01 NOTE — Telephone Encounter (Signed)
Spoke to pt's wife [name], told her form for clearance for surgery was faxed back to [facility] and he cleared him for surgery. Sarah verbalized understanding.

## 2014-06-04 ENCOUNTER — Ambulatory Visit: Payer: Self-pay | Admitting: Orthopedic Surgery

## 2014-07-08 NOTE — Patient Instructions (Signed)
Charles Mccoy Jun  07/08/2014   Your procedure is scheduled on:  07/21/14    Report to [facility].  Follow the Signs to [place] at  0530      am  Call this number if you have problems the morning of surgery: [phone]   Remember:   Do not eat food or drink liquids after midnight.   Take these medicines the morning of surgery with A SIP OF WATER:    Do not wear jewelry,  Do not wear lotions, powders, or perfumes.  deodorant.  . Men may shave face and neck.  Do not bring valuables to the hospital.  Contacts, dentures or bridgework may not be worn into surgery.  Leave suitcase in the car. After surgery it may be brought to your room.  For patients admitted to the hospital, checkout time is 11:00 AM the day of  discharge.       [facility] - Preparing for Surgery Before surgery, you can play an important role.  Because skin is not sterile, your skin needs to be as free of germs as possible.  You can reduce the number of germs on your skin by washing with CHG (chlorahexidine gluconate) soap before surgery.  CHG is an antiseptic cleaner which kills germs and bonds with the skin to continue killing germs even after washing. Please DO NOT use if you have an allergy to CHG or antibacterial soaps.  If your skin becomes reddened/irritated stop using the CHG and inform your nurse when you arrive at Short Stay. Do not shave (including legs and underarms) for at least 48 hours prior to the first CHG shower.  You may shave your face/neck. Please follow these instructions carefully:  1.  Shower with CHG Soap the night before surgery and the  morning of Surgery.  2.  If you choose to wash your hair, wash your hair first as usual with your  normal  shampoo.  3.  After you shampoo, rinse your hair and body thoroughly to remove the  shampoo.                           4.  Use CHG as you would any other liquid soap.  You can apply chg directly  to the skin and wash   Gently with a scrungie or clean washcloth.  5.  Apply the CHG Soap to your body ONLY FROM THE NECK DOWN.   Do not use on face/ open                           Wound or open sores. Avoid contact with eyes, ears mouth and genitals (private parts).                       Wash face,  Genitals (private parts) with your normal soap.             6.  Wash thoroughly, paying special attention to the area where your surgery  will be performed.  7.  Thoroughly rinse your body with warm water from the neck down.  8.  DO NOT shower/wash with your normal soap after using and rinsing off  the CHG Soap.                9.  Pat yourself dry with a clean towel.  10.  Wear clean pajamas.            11.  Place clean sheets on your bed the night of your first shower and do not  sleep with pets. Day of Surgery : Do not apply any lotions/deodorants the morning of surgery.  Please wear clean clothes to the hospital/surgery center.  FAILURE TO FOLLOW THESE INSTRUCTIONS MAY RESULT IN THE CANCELLATION OF YOUR SURGERY PATIENT SIGNATURE_________________________________  NURSE SIGNATURE__________________________________  ________________________________________________________________________  WHAT IS A BLOOD TRANSFUSION? Blood Transfusion Information  A transfusion is the replacement of blood or some of its parts. Blood is made up of multiple cells which provide different functions.  Red blood cells carry oxygen and are used for blood loss replacement.  White blood cells fight against infection.  Platelets control bleeding.  Plasma helps clot blood.  Other blood products are available for specialized needs, such as hemophilia or other clotting disorders. BEFORE THE TRANSFUSION  Who gives blood for transfusions?   Healthy volunteers who are fully evaluated to make sure their blood is safe. This is blood bank blood. Transfusion therapy is the safest it has ever been in the practice of medicine. Before  blood is taken from a donor, a complete history is taken to make sure that person has no history of diseases nor engages in risky social behavior (examples are intravenous drug use or sexual activity with multiple partners). The donor's travel history is screened to minimize risk of transmitting infections, such as malaria. The donated blood is tested for signs of infectious diseases, such as HIV and hepatitis. The blood is then tested to be sure it is compatible with you in order to minimize the chance of a transfusion reaction. If you or a relative donates blood, this is often done in anticipation of surgery and is not appropriate for emergency situations. It takes many days to process the donated blood. RISKS AND COMPLICATIONS Although transfusion therapy is very safe and saves many lives, the main dangers of transfusion include:   Getting an infectious disease.  Developing a transfusion reaction. This is an allergic reaction to something in the blood you were given. Every precaution is taken to prevent this. The decision to have a blood transfusion has been considered carefully by your caregiver before blood is given. Blood is not given unless the benefits outweigh the risks. AFTER THE TRANSFUSION  Right after receiving a blood transfusion, you will usually feel much better and more energetic. This is especially true if your red blood cells have gotten low (anemic). The transfusion raises the level of the red blood cells which carry oxygen, and this usually causes an energy increase.  The nurse administering the transfusion will monitor you carefully for complications. HOME CARE INSTRUCTIONS  No special instructions are needed after a transfusion. You may find your energy is better. Speak with your caregiver about any limitations on activity for underlying diseases you may have. SEEK MEDICAL CARE IF:   Your condition is not improving after your transfusion.  You develop redness or irritation  at the intravenous (IV) site. SEEK IMMEDIATE MEDICAL CARE IF:  Any of the following symptoms occur over the next 12 hours:  Shaking chills.  You have a temperature by mouth above 102 F (38.9 C), not controlled by medicine.  Chest, back, or muscle pain.  People around you feel you are not acting correctly or are confused.  Shortness of breath or difficulty breathing.  Dizziness and fainting.  You get a rash or develop  hives.  You have a decrease in urine output.  Your urine turns a dark color or changes to pink, red, or brown. Any of the following symptoms occur over the next 10 days:  You have a temperature by mouth above 102 F (38.9 C), not controlled by medicine.  Shortness of breath.  Weakness after normal activity.  The white part of the eye turns yellow (jaundice).  You have a decrease in the amount of urine or are urinating less often.  Your urine turns a dark color or changes to pink, red, or brown. Document Released: 09/20/2000 Document Revised: 12/16/2011 Document Reviewed: 05/09/2008 [employer] Patient Information 2014 [place].  _______________________________________________________________________  Incentive Spirometer  An incentive spirometer is a tool that can help keep your lungs clear and active. This tool measures how well you are filling your lungs with each breath. Taking long deep breaths may help reverse or decrease the chance of developing breathing (pulmonary) problems (especially infection) following:  A long period of time when you are unable to move or be active. BEFORE THE PROCEDURE   If the spirometer includes an indicator to show your best effort, your nurse or respiratory therapist will set it to a desired goal.  If possible, sit up straight or lean slightly forward. Try not to slouch.  Hold the incentive spirometer in an upright position. INSTRUCTIONS FOR USE  1. Sit on the edge of your bed if possible, or sit up as far as  you can in bed or on a chair. 2. Hold the incentive spirometer in an upright position. 3. Breathe out normally. 4. Place the mouthpiece in your mouth and seal your lips tightly around it. 5. Breathe in slowly and as deeply as possible, raising the piston or the ball toward the top of the column. 6. Hold your breath for 3-5 seconds or for as long as possible. Allow the piston or ball to fall to the bottom of the column. 7. Remove the mouthpiece from your mouth and breathe out normally. 8. Rest for a few seconds and repeat Steps 1 through 7 at least 10 times every 1-2 hours when you are awake. Take your time and take a few normal breaths between deep breaths. 9. The spirometer may include an indicator to show your best effort. Use the indicator as a goal to work toward during each repetition. 10. After each set of 10 deep breaths, practice coughing to be sure your lungs are clear. If you have an incision (the cut made at the time of surgery), support your incision when coughing by placing a pillow or rolled up towels firmly against it. Once you are able to get out of bed, walk around indoors and cough well. You may stop using the incentive spirometer when instructed by your caregiver.  RISKS AND COMPLICATIONS  Take your time so you do not get dizzy or light-headed.  If you are in pain, you may need to take or ask for pain medication before doing incentive spirometry. It is harder to take a deep breath if you are having pain. AFTER USE  Rest and breathe slowly and easily.  It can be helpful to keep track of a log of your progress. Your caregiver can provide you with a simple table to help with this. If you are using the spirometer at home, follow these instructions: [place] IF:   You are having difficultly using the spirometer.  You have trouble using the spirometer as often as instructed.  Your  pain medication is not giving enough relief while using the spirometer.  You develop  fever of 100.5 F (38.1 C) or higher. SEEK IMMEDIATE MEDICAL CARE IF:   You cough up bloody sputum that had not been present before.  You develop fever of 102 F (38.9 C) or greater.  You develop worsening pain at or near the incision site. MAKE SURE YOU:   Understand these instructions.  Will watch your condition.  Will get help right away if you are not doing well or get worse. Document Released: 02/03/2007 Document Revised: 12/16/2011 Document Reviewed: 04/06/2007 [employer] Patient Information 2014 [employer], [place].   ________________________________________________________________________    Please read over the following fact sheets that you were given: MRSA Information, coughing and deep breathing exercises, leg exercises

## 2014-07-11 ENCOUNTER — Inpatient Hospital Stay (HOSPITAL_COMMUNITY): Admission: RE | Admit: 2014-07-11 | Payer: BC Managed Care – PPO | Source: Ambulatory Visit

## 2014-07-11 ENCOUNTER — Encounter (HOSPITAL_COMMUNITY): Payer: Self-pay | Admitting: Pharmacy Technician

## 2014-07-12 ENCOUNTER — Encounter (HOSPITAL_COMMUNITY)
Admission: RE | Admit: 2014-07-12 | Discharge: 2014-07-12 | Disposition: A | Discharge status: Home / Self Care | Payer: BC Managed Care – PPO | Source: Ambulatory Visit | Attending: Specialist | Admitting: Specialist

## 2014-07-12 ENCOUNTER — Ambulatory Visit: Payer: Self-pay | Admitting: Orthopedic Surgery

## 2014-07-12 ENCOUNTER — Ambulatory Visit (HOSPITAL_COMMUNITY)
Admission: RE | Admit: 2014-07-12 | Discharge: 2014-07-12 | Disposition: A | Discharge status: Home / Self Care | Payer: BC Managed Care – PPO | Source: Ambulatory Visit | Attending: Orthopedic Surgery | Admitting: Orthopedic Surgery

## 2014-07-12 ENCOUNTER — Encounter (HOSPITAL_COMMUNITY): Payer: Self-pay

## 2014-07-12 ENCOUNTER — Encounter (INDEPENDENT_AMBULATORY_CARE_PROVIDER_SITE_OTHER): Payer: Self-pay

## 2014-07-12 ENCOUNTER — Other Ambulatory Visit: Payer: Self-pay | Admitting: Orthopedic Surgery

## 2014-07-12 ENCOUNTER — Ambulatory Visit (HOSPITAL_COMMUNITY)
Admission: RE | Admit: 2014-07-12 | Discharge: 2014-07-12 | Disposition: A | Discharge status: Home / Self Care | Payer: BC Managed Care – PPO | Source: Ambulatory Visit | Attending: Specialist | Admitting: Specialist

## 2014-07-12 DIAGNOSIS — M1712 Unilateral primary osteoarthritis, left knee: Secondary | ICD-10-CM | POA: Insufficient documentation

## 2014-07-12 DIAGNOSIS — Z87891 Personal history of nicotine dependence: Secondary | ICD-10-CM | POA: Diagnosis not present

## 2014-07-12 DIAGNOSIS — J45909 Unspecified asthma, uncomplicated: Secondary | ICD-10-CM | POA: Diagnosis not present

## 2014-07-12 DIAGNOSIS — Z01810 Encounter for preprocedural cardiovascular examination: Secondary | ICD-10-CM | POA: Insufficient documentation

## 2014-07-12 DIAGNOSIS — Z01812 Encounter for preprocedural laboratory examination: Secondary | ICD-10-CM | POA: Insufficient documentation

## 2014-07-12 DIAGNOSIS — I10 Essential (primary) hypertension: Secondary | ICD-10-CM | POA: Diagnosis not present

## 2014-07-12 DIAGNOSIS — J449 Chronic obstructive pulmonary disease, unspecified: Secondary | ICD-10-CM | POA: Diagnosis not present

## 2014-07-12 HISTORY — DX: Chronic obstructive pulmonary disease, unspecified: J44.9

## 2014-07-12 HISTORY — DX: Gastro-esophageal reflux disease without esophagitis: K21.9

## 2014-07-12 HISTORY — DX: Pneumonia, unspecified organism: J18.9

## 2014-07-12 LAB — BASIC METABOLIC PANEL
ANION GAP: 10 (ref 5–15)
BUN: 18 mg/dL (ref 6–23)
CO2: 27 mEq/L (ref 19–32)
CREATININE: 1.02 mg/dL (ref 0.50–1.35)
Calcium: 9.3 mg/dL (ref 8.4–10.5)
Chloride: 101 mEq/L (ref 96–112)
GFR calc Af Amer: 90 mL/min — ABNORMAL LOW (ref >90)
GFR, EST NON AFRICAN AMERICAN: 77 mL/min — AB (ref >90)
Glucose, Bld: 78 mg/dL (ref 70–99)
Potassium: 4.5 mEq/L (ref 3.7–5.3)
SODIUM: 138 meq/L (ref 137–147)

## 2014-07-12 LAB — URINALYSIS, ROUTINE W REFLEX MICROSCOPIC
BILIRUBIN URINE: NEGATIVE
Glucose, UA: NEGATIVE mg/dL
Hgb urine dipstick: NEGATIVE
KETONES UR: NEGATIVE mg/dL
Nitrite: NEGATIVE
PROTEIN: NEGATIVE mg/dL
Specific Gravity, Urine: 1.019 (ref 1.005–1.030)
Urobilinogen, UA: 0.2 mg/dL (ref 0.0–1.0)
pH: 5.5 (ref 5.0–8.0)

## 2014-07-12 LAB — CBC
HCT: 42.5 % (ref 39.0–52.0)
Hemoglobin: 14.7 g/dL (ref 13.0–17.0)
MCH: 29.8 pg (ref 26.0–34.0)
MCHC: 34.6 g/dL (ref 30.0–36.0)
MCV: 86.2 fL (ref 78.0–100.0)
PLATELETS: 215 10*3/uL (ref 150–400)
RBC: 4.93 MIL/uL (ref 4.22–5.81)
RDW: 13.3 % (ref 11.5–15.5)
WBC: 6.1 10*3/uL (ref 4.0–10.5)

## 2014-07-12 LAB — PROTIME-INR
INR: 1.08 (ref 0.00–1.49)
Prothrombin Time: 14.1 seconds (ref 11.6–15.2)

## 2014-07-12 LAB — APTT: aPTT: 33 seconds (ref 24–37)

## 2014-07-12 LAB — SURGICAL PCR SCREEN
MRSA, PCR: NEGATIVE
STAPHYLOCOCCUS AUREUS: NEGATIVE

## 2014-07-12 LAB — URINE MICROSCOPIC-ADD ON

## 2014-07-12 NOTE — H&P (Signed)
Charles Mccoy DOB: 03/25/1951 Married / Language: [name] / Race: White Male  H&P date: 07/12/14  Chief complaint: Left knee pain  History of Present Illness The patient is a 63 year old male who comes in today for a preoperative history and physical. The patient is scheduled for a left total knee arthroplasty to be performed by Dr. Johnn Hai, MD at [facility] on 07/21/14. K[place]on has had years of B/L knee pain, left consistently worse than right, with ongoing pain, swelling, stiffness, progressively worsening symptoms now limiting ADL's. Hx of prior arthroscopy on both knees. Refractory to viscosupplementation, steroid injections, NSAIDs, pain meds, HEP, quad strengthening, activity modifications, relative rest.  Dr. Tonita Cong and the patient mutually agreed to proceed with a total knee replacement. Risks and benefits of the procedure were discussed including stiffness, suboptimal range of motion, persistent pain, infection requiring removal of prosthesis and reinsertion, need for prophylactic antibiotics in the future, for example, dental procedures, possible need for manipulation, revision in the future and also anesthetic complications including DVT, PE, etc. We discussed the perioperative course, time in the hospital, postoperative recovery and the need for elevation to control swelling. We also discussed the predicted range of motion and the probability that squatting and kneeling would be unobtainable in the future. In addition, postoperative anticoagulation was discussed. We have obtained preoperative medical clearance as necessary. Provided her illustrated handout and discussed it in detail. They will enroll in the total joint replacement educational forum at the hospital.  His [facility] pre-op appt is scheduled for 10/6.  Allergies No Known Drug Allergies09/16/2013  Family History Depression mother Osteoporosis mother Diabetes Mellitus grandfather mothers side Drug / Alcohol  Addiction father Heart Disease Maternal Grandfather, Paternal Jon Gills. grandfather mothers side Cancer Father. father Osteoarthritis Mother. mother Hypertension Maternal Grandmother, Mother. mother First Degree Relatives  Social History Tobacco use Former smoker. 04/06/2014: smoke(d) 1 pack(s) per day former smoker Children 2 Current drinker 04/06/2014: Currently drinks beer, wine and hard liquor 5-7 times per week Current work status working full time Exercise Exercises weekly; does running / walking Exercises daily; does individual sport Living situation live with spouse. 15 steps to enter home. 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 AmLODIPine Besylate (10MG  Tablet, Oral daily) Active. CeleBREX (200MG  Capsule, Oral) Active. Omeprazole (20MG  Capsule DR, Oral) Active. Spironolactone (25MG  Tablet, Oral) Active. ProAir HFA (108 (90 Base)MCG/ACT Aerosol Soln, Inhalation) Active. Advair Diskus (250-50MCG/DOSE Aero Pow Br Act, Inhalation) Active. Tylenol Arthritis Pain (650MG  Tablet ER, Oral as needed) Active. Medications Reconciled  Past Surgical History  Arthroscopy of Knee bilateral  Past Medical Hx Asthma Chronic Obstructive Lung Disease High blood pressure Osteoarthritis Other disease, cancer, significant illness Allergies Pet/Grass  Review of Systems    General Not Present- Chills, Fatigue, Fever, Memory Loss, Night Sweats, Weight Gain and Weight Loss. Skin Not Present- Eczema, Hives, Itching, Lesions and Rash. HEENT Not Present- Dentures, Double Vision, Headache, Hearing Loss, Tinnitus and Visual Loss. Respiratory Not Present- Allergies, Chronic Cough,  Coughing up blood, Shortness of breath at rest and Shortness of breath with exertion. Cardiovascular Not Present- Chest Pain, Difficulty Breathing Lying Down, Murmur, Palpitations, Racing/skipping heartbeats and Swelling. Gastrointestinal Not Present- Abdominal Pain, Bloody Stool, Constipation, Diarrhea, Difficulty Swallowing, Heartburn, Jaundice, Loss of appetitie, Nausea and Vomiting. Male Genitourinary Not Present- Blood in Urine, Discharge, Flank Pain, Incontinence, Painful Urination, Urgency, Urinary frequency, Urinary Retention, Urinating at Night and Weak urinary stream. Musculoskeletal Present- Joint Pain, Joint Stiffness, Joint Swelling and Morning Stiffness. Not Present- Back Pain, Muscle Pain, Muscle Weakness and Spasms. Neurological Not Present- Blackout spells, Difficulty with balance, Dizziness, Paralysis, Tremor and Weakness. Psychiatric Not Present- Insomnia.  Physical Exam General Mental Status -Alert, cooperative and good historian. General Appearance-pleasant, Not in acute distress. Orientation-Oriented X3. Build & Nutrition-Well nourished and Well developed.  Head and Neck Head-normocephalic, atraumatic . Neck Global Assessment - supple, no bruit auscultated on the right, no bruit auscultated on the left.  Eye Pupil - Bilateral-Regular and Round. Motion - Bilateral-EOMI.  Chest and Lung Exam Auscultation Breath sounds - clear at anterior chest wall and clear at posterior chest wall. Adventitious sounds - No Adventitious sounds.  Cardiovascular Auscultation Rhythm - Regular rate and rhythm. Heart Sounds - S1 WNL and S2 WNL. Murmurs & Other Heart  Sounds - Auscultation of the heart reveals - No Murmurs.  Abdomen Palpation/Percussion Tenderness - Abdomen is non-tender to palpation. Rigidity (guarding) - Abdomen is soft. Auscultation Auscultation of the abdomen reveals - Bowel sounds normal.  Male Genitourinary Note: Not done, not pertinent to present illness  Musculoskeletal Note: On exam varus deformity. Tender along the medial joint line. Patellofemoral pain with compression. Pain when he ambulates. No instability to varus/valgus stress.  Imaging AP standing and lateral xrays show bone on bone arthrosis patellofemoral, medial compartment B/L knees  Assessment & Plan Left knee DJD  Pt with end-stage B/L knee DJD, bone-on-bone, refractory to conservative tx, scheduled for total knee replacement by Dr. Tonita Cong on 07/21/14. He would like to proceed with the left knee first as this has consistently been more painful for him for many years. Will proceed with Left TKA. We again discussed the procedure itself as well as risks, complications and alternatives, including but not limited to DVT, PE, infx, bleeding, failure of procedure, need for secondary procedure including manipulation, nerve injury, ongoing pain/symptoms, anesthesia risk, even stroke or death. Also discussed typical post-op protocols, activity restrictions, need for PT, flexion/extension exercises, time out of work. Discussed need for DVT ppx post-op with Xarelto then ASA per protocol. Discussed dental ppx. Also discussed limitations post-operatively such as kneeling and squatting. All questions were answered. Patient desires to proceed with surgery as scheduled. Will hold ASA and NSAIDs accordingly including celebrex. Will remain NPO after MN night before surgery. Will present to [facility] for pre-op testing as scheduled later today. Plan Xarelto 2 weeks post-op for DVT ppx then ASA. Plan ES Norco/Percocet prn pain, Robaxin, Colace. Plan short term stay at [facility] post-op for  continued PT prior to his return home then setting up outpt PT vs. HHPT depending on his progress. Will follow up 10-14 days post-op for staple removal and xrays.  Plan LEFT total knee replacement  Signed electronically by Lacie Draft, PA-C for Dr. Tonita Cong

## 2014-07-12 NOTE — Progress Notes (Signed)
U/A with micro results faxed via EPIC to DR Beane done 07/12/2014.

## 2014-07-12 NOTE — Patient Instructions (Addendum)
Charles Mccoy  07/12/2014   Your procedure is scheduled on:  07/21/2014.    Report to Old Emergency Room entrance on [facility].  This will be marked with a RED SIGN.   That entrance is the [place].  Marland Kitchen  REPORT IN AT 0530am.    Call this number if you have problems the morning of surgery: [phone]   Remember:   Do not eat food or drink liquids after midnight.   Take these medicines the morning of surgery with A SIP OF WATER: Amlodipine ( Norvasc), Albuterol Inhaler if needed, , Advia Inharler , Prilosec and Bring Inhalers with you.  Singular  Do not wear jewelry,  Do not wear lotions, powders, or perfumes.  deodorant.  . Men may shave face and neck.  Do not bring valuables to the hospital.  Contacts, dentures or bridgework may not be worn into surgery.  Leave suitcase in the car. After surgery it may be brought to your room.  For patients admitted to the hospital, checkout time is 11:00 AM the day of  discharge.  [place] - Preparing for Surgery Before surgery, you can play an important role.  Because skin is not sterile, your skin needs to be as free of germs as possible.  You can reduce the number of germs on your skin by washing with CHG (chlorahexidine gluconate) soap before surgery.  CHG is an antiseptic cleaner which kills germs and bonds with the skin to continue killing germs even after washing. Please DO NOT use if you have an allergy to CHG or antibacterial soaps.  If your skin becomes reddened/irritated stop using the CHG and inform your nurse when you arrive at Short Stay. Do not shave (including legs and underarms) for at least 48 hours prior to the first CHG shower.  You may shave your face/neck. Please follow these instructions carefully:  1.  Shower with CHG Soap the night before surgery and the  morning of Surgery.  2.  If you choose to wash your hair, wash your hair first as usual with your  normal  shampoo.  3.  After you shampoo, rinse your hair and  body thoroughly to remove the  shampoo.                           4.  Use CHG as you would any other liquid soap.  You can apply chg directly  to the skin and wash                       Gently with a scrungie or clean washcloth.  5.  Apply the CHG Soap to your body ONLY FROM THE NECK DOWN.   Do not use on face/ open                           Wound or open sores. Avoid contact with eyes, ears mouth and genitals (private parts).                       Wash face,  Genitals (private parts) with your normal soap.             6.  Wash thoroughly, paying special attention to the area where your surgery  will be performed.  7.  Thoroughly rinse your body with warm water from the neck down.  8.  DO  NOT shower/wash with your normal soap after using and rinsing off  the CHG Soap.                9.  Pat yourself dry with a clean towel.            10.  Wear clean pajamas.            11.  Place clean sheets on your bed the night of your first shower and do not  sleep with pets. Day of Surgery : Do not apply any lotions/deodorants the morning of surgery.  Please wear clean clothes to the hospital/surgery center.  FAILURE TO FOLLOW THESE INSTRUCTIONS MAY RESULT IN THE CANCELLATION OF YOUR SURGERY PATIENT SIGNATURE_________________________________  NURSE SIGNATURE__________________________________  ________________________________________________________________________  WHAT IS A BLOOD TRANSFUSION? Blood Transfusion Information  A transfusion is the replacement of blood or some of its parts. Blood is made up of multiple cells which provide different functions.  Red blood cells carry oxygen and are used for blood loss replacement.  White blood cells fight against infection.  Platelets control bleeding.  Plasma helps clot blood.  Other blood products are available for specialized needs, such as hemophilia or other clotting disorders. BEFORE THE TRANSFUSION  Who gives blood for transfusions?    Healthy volunteers who are fully evaluated to make sure their blood is safe. This is blood bank blood. Transfusion therapy is the safest it has ever been in the practice of medicine. Before blood is taken from a donor, a complete history is taken to make sure that person has no history of diseases nor engages in risky social behavior (examples are intravenous drug use or sexual activity with multiple partners). The donor's travel history is screened to minimize risk of transmitting infections, such as malaria. The donated blood is tested for signs of infectious diseases, such as HIV and hepatitis. The blood is then tested to be sure it is compatible with you in order to minimize the chance of a transfusion reaction. If you or a relative donates blood, this is often done in anticipation of surgery and is not appropriate for emergency situations. It takes many days to process the donated blood. RISKS AND COMPLICATIONS Although transfusion therapy is very safe and saves many lives, the main dangers of transfusion include:   Getting an infectious disease.  Developing a transfusion reaction. This is an allergic reaction to something in the blood you were given. Every precaution is taken to prevent this. The decision to have a blood transfusion has been considered carefully by your caregiver before blood is given. Blood is not given unless the benefits outweigh the risks. AFTER THE TRANSFUSION  Right after receiving a blood transfusion, you will usually feel much better and more energetic. This is especially true if your red blood cells have gotten low (anemic). The transfusion raises the level of the red blood cells which carry oxygen, and this usually causes an energy increase.  The nurse administering the transfusion will monitor you carefully for complications. HOME CARE INSTRUCTIONS  No special instructions are needed after a transfusion. You may find your energy is better. Speak with your  caregiver about any limitations on activity for underlying diseases you may have. SEEK MEDICAL CARE IF:   Your condition is not improving after your transfusion.  You develop redness or irritation at the intravenous (IV) site. SEEK IMMEDIATE MEDICAL CARE IF:  Any of the following symptoms occur over the next 12 hours:  Shaking chills.  You have  a temperature by mouth above 102 F (38.9 C), not controlled by medicine.  Chest, back, or muscle pain.  People around you feel you are not acting correctly or are confused.  Shortness of breath or difficulty breathing.  Dizziness and fainting.  You get a rash or develop hives.  You have a decrease in urine output.  Your urine turns a dark color or changes to pink, red, or brown. Any of the following symptoms occur over the next 10 days:  You have a temperature by mouth above 102 F (38.9 C), not controlled by medicine.  Shortness of breath.  Weakness after normal activity.  The white part of the eye turns yellow (jaundice).  You have a decrease in the amount of urine or are urinating less often.  Your urine turns a dark color or changes to pink, red, or brown. Document Released: 09/20/2000 Document Revised: 12/16/2011 Document Reviewed: 05/09/2008 ExitCare Patient Information 2014 [place].  _______________________________________________________________________  Incentive Spirometer  An incentive spirometer is a tool that can help keep your lungs clear and active. This tool measures how well you are filling your lungs with each breath. Taking long deep breaths may help reverse or decrease the chance of developing breathing (pulmonary) problems (especially infection) following:  A long period of time when you are unable to move or be active. BEFORE THE PROCEDURE   If the spirometer includes an indicator to show your best effort, your nurse or respiratory therapist will set it to a desired goal.  If possible, sit  up straight or lean slightly forward. Try not to slouch.  Hold the incentive spirometer in an upright position. INSTRUCTIONS FOR USE  1. Sit on the edge of your bed if possible, or sit up as far as you can in bed or on a chair. 2. Hold the incentive spirometer in an upright position. 3. Breathe out normally. 4. Place the mouthpiece in your mouth and seal your lips tightly around it. 5. Breathe in slowly and as deeply as possible, raising the piston or the ball toward the top of the column. 6. Hold your breath for 3-5 seconds or for as long as possible. Allow the piston or ball to fall to the bottom of the column. 7. Remove the mouthpiece from your mouth and breathe out normally. 8. Rest for a few seconds and repeat Steps 1 through 7 at least 10 times every 1-2 hours when you are awake. Take your time and take a few normal breaths between deep breaths. 9. The spirometer may include an indicator to show your best effort. Use the indicator as a goal to work toward during each repetition. 10. After each set of 10 deep breaths, practice coughing to be sure your lungs are clear. If you have an incision (the cut made at the time of surgery), support your incision when coughing by placing a pillow or rolled up towels firmly against it. Once you are able to get out of bed, walk around indoors and cough well. You may stop using the incentive spirometer when instructed by your caregiver.  RISKS AND COMPLICATIONS  Take your time so you do not get dizzy or light-headed.  If you are in pain, you may need to take or ask for pain medication before doing incentive spirometry. It is harder to take a deep breath if you are having pain. AFTER USE  Rest and breathe slowly and easily.  It can be helpful to keep track of a log of your progress.  Your caregiver can provide you with a simple table to help with this. If you are using the spirometer at home, follow these instructions: [place] IF:   You are  having difficultly using the spirometer.  You have trouble using the spirometer as often as instructed.  Your pain medication is not giving enough relief while using the spirometer.  You develop fever of 100.5 F (38.1 C) or higher. SEEK IMMEDIATE MEDICAL CARE IF:   You cough up bloody sputum that had not been present before.  You develop fever of 102 F (38.9 C) or greater.  You develop worsening pain at or near the incision site. MAKE SURE YOU:   Understand these instructions.  Will watch your condition.  Will get help right away if you are not doing well or get worse. Document Released: 02/03/2007 Document Revised: 12/16/2011 Document Reviewed: 04/06/2007 ExitCare Patient Information 2014 ExitCare, [place].   ________________________________________________________________________    Please read over the following fact sheets that you were given: MRSA Information, coughing and deep breathing exercises, leg exercises

## 2014-07-21 ENCOUNTER — Encounter (HOSPITAL_COMMUNITY)
Admission: RE | Disposition: A | Discharge status: Skilled Nursing Facility | Payer: Self-pay | Source: Ambulatory Visit | Attending: Specialist

## 2014-07-21 ENCOUNTER — Encounter (HOSPITAL_COMMUNITY): Payer: Self-pay | Admitting: *Deleted

## 2014-07-21 ENCOUNTER — Inpatient Hospital Stay (HOSPITAL_COMMUNITY): Payer: BC Managed Care – PPO | Admitting: Certified Registered Nurse Anesthetist

## 2014-07-21 ENCOUNTER — Encounter (HOSPITAL_COMMUNITY): Payer: BC Managed Care – PPO | Admitting: Certified Registered Nurse Anesthetist

## 2014-07-21 ENCOUNTER — Inpatient Hospital Stay (HOSPITAL_COMMUNITY)
Admission: RE | Admit: 2014-07-21 | Discharge: 2014-07-25 | DRG: 470 | Disposition: A | Discharge status: Skilled Nursing Facility | Payer: BC Managed Care – PPO | Source: Ambulatory Visit | Attending: Specialist | Admitting: Specialist

## 2014-07-21 ENCOUNTER — Inpatient Hospital Stay (HOSPITAL_COMMUNITY): Payer: BC Managed Care – PPO

## 2014-07-21 DIAGNOSIS — Z96652 Presence of left artificial knee joint: Secondary | ICD-10-CM

## 2014-07-21 DIAGNOSIS — M179 Osteoarthritis of knee, unspecified: Principal | ICD-10-CM | POA: Diagnosis present

## 2014-07-21 DIAGNOSIS — Z6830 Body mass index (BMI) 30.0-30.9, adult: Secondary | ICD-10-CM

## 2014-07-21 DIAGNOSIS — M21162 Varus deformity, not elsewhere classified, left knee: Secondary | ICD-10-CM | POA: Diagnosis present

## 2014-07-21 DIAGNOSIS — J449 Chronic obstructive pulmonary disease, unspecified: Secondary | ICD-10-CM | POA: Diagnosis present

## 2014-07-21 DIAGNOSIS — M25562 Pain in left knee: Secondary | ICD-10-CM | POA: Diagnosis present

## 2014-07-21 DIAGNOSIS — M1712 Unilateral primary osteoarthritis, left knee: Secondary | ICD-10-CM

## 2014-07-21 DIAGNOSIS — Z96659 Presence of unspecified artificial knee joint: Secondary | ICD-10-CM

## 2014-07-21 DIAGNOSIS — K219 Gastro-esophageal reflux disease without esophagitis: Secondary | ICD-10-CM | POA: Diagnosis present

## 2014-07-21 DIAGNOSIS — I10 Essential (primary) hypertension: Secondary | ICD-10-CM | POA: Diagnosis present

## 2014-07-21 DIAGNOSIS — Z87891 Personal history of nicotine dependence: Secondary | ICD-10-CM | POA: Diagnosis not present

## 2014-07-21 HISTORY — PX: TOTAL KNEE ARTHROPLASTY: SHX125

## 2014-07-21 LAB — TYPE AND SCREEN
ABO/RH(D): A POS
Antibody Screen: NEGATIVE

## 2014-07-21 LAB — ABO/RH: ABO/RH(D): A POS

## 2014-07-21 SURGERY — ARTHROPLASTY, KNEE, TOTAL
Anesthesia: General | Site: Knee | Laterality: Left

## 2014-07-21 MED ORDER — BUPIVACAINE-EPINEPHRINE 0.25% -1:200000 IJ SOLN
INTRAMUSCULAR | Status: DC | PRN
  Administered 2014-07-21: 30 mL

## 2014-07-21 MED ORDER — SODIUM CHLORIDE 0.9 % IR SOLN
Status: DC | PRN
  Administered 2014-07-21: 09:00:00

## 2014-07-21 MED ORDER — ALBUTEROL SULFATE (2.5 MG/3ML) 0.083% IN NEBU: 2.5000 mg | INHALATION_SOLUTION | Freq: Four times a day (QID) | RESPIRATORY_TRACT | Status: DC | PRN

## 2014-07-21 MED ORDER — ONDANSETRON HCL 4 MG/2ML IJ SOLN
4.0000 mg | Freq: Four times a day (QID) | INTRAMUSCULAR | Status: DC | PRN
  Administered 2014-07-21 – 2014-07-23 (×3): 4 mg via INTRAVENOUS
  Filled 2014-07-21 (×3): qty 2

## 2014-07-21 MED ORDER — OXYCODONE HCL 5 MG PO TABS
5.0000 mg | ORAL_TABLET | ORAL | Status: DC | PRN
  Administered 2014-07-21 (×2): 10 mg via ORAL
  Administered 2014-07-22: 5 mg via ORAL
  Administered 2014-07-22 – 2014-07-25 (×13): 10 mg via ORAL
  Filled 2014-07-21 (×15): qty 2
  Filled 2014-07-21: qty 1

## 2014-07-21 MED ORDER — AMLODIPINE BESYLATE 10 MG PO TABS
10.0000 mg | ORAL_TABLET | Freq: Every day | ORAL | Status: DC
  Administered 2014-07-21 – 2014-07-25 (×5): 10 mg via ORAL
  Filled 2014-07-21 (×5): qty 1

## 2014-07-21 MED ORDER — ACETAMINOPHEN 650 MG RE SUPP
650.0000 mg | Freq: Four times a day (QID) | RECTAL | Status: DC | PRN
Start: 2014-07-21 — End: 2014-07-25

## 2014-07-21 MED ORDER — NEOSTIGMINE METHYLSULFATE 10 MG/10ML IV SOLN
INTRAVENOUS | Status: DC | PRN
  Administered 2014-07-21: 4 mg via INTRAVENOUS

## 2014-07-21 MED ORDER — KETAMINE HCL 10 MG/ML IJ SOLN
INTRAMUSCULAR | Status: AC
  Filled 2014-07-21: qty 1

## 2014-07-21 MED ORDER — PHENOL 1.4 % MT LIQD
1.0000 | OROMUCOSAL | Status: DC | PRN
  Filled 2014-07-21: qty 177

## 2014-07-21 MED ORDER — BUPIVACAINE LIPOSOME 1.3 % IJ SUSP
20.0000 mL | Freq: Once | INTRAMUSCULAR | Status: DC
  Filled 2014-07-21: qty 20

## 2014-07-21 MED ORDER — CEFAZOLIN SODIUM-DEXTROSE 2-3 GM-% IV SOLR
2.0000 g | Freq: Four times a day (QID) | INTRAVENOUS | Status: AC
  Administered 2014-07-21 – 2014-07-22 (×3): 2 g via INTRAVENOUS
  Filled 2014-07-21 (×3): qty 50

## 2014-07-21 MED ORDER — FENTANYL CITRATE 0.05 MG/ML IJ SOLN
INTRAMUSCULAR | Status: AC
  Filled 2014-07-21: qty 2

## 2014-07-21 MED ORDER — GLYCOPYRROLATE 0.2 MG/ML IJ SOLN
INTRAMUSCULAR | Status: DC | PRN
  Administered 2014-07-21: 0.6 mg via INTRAVENOUS

## 2014-07-21 MED ORDER — OXYCODONE-ACETAMINOPHEN 7.5-325 MG PO TABS: 1.0000 | ORAL_TABLET | ORAL | Status: DC | PRN

## 2014-07-21 MED ORDER — HYDROMORPHONE HCL 1 MG/ML IJ SOLN
0.2500 mg | INTRAMUSCULAR | Status: DC | PRN
  Administered 2014-07-21: 0.5 mg via INTRAVENOUS

## 2014-07-21 MED ORDER — BISACODYL 5 MG PO TBEC
5.0000 mg | DELAYED_RELEASE_TABLET | Freq: Every day | ORAL | Status: DC | PRN
  Administered 2014-07-24: 5 mg via ORAL
  Filled 2014-07-21: qty 1

## 2014-07-21 MED ORDER — GLYCOPYRROLATE 0.2 MG/ML IJ SOLN
INTRAMUSCULAR | Status: AC
  Filled 2014-07-21: qty 3

## 2014-07-21 MED ORDER — DIPHENHYDRAMINE HCL 12.5 MG/5ML PO ELIX: 12.5000 mg | ORAL_SOLUTION | ORAL | Status: DC | PRN

## 2014-07-21 MED ORDER — ONDANSETRON HCL 4 MG PO TABS
4.0000 mg | ORAL_TABLET | Freq: Four times a day (QID) | ORAL | Status: DC | PRN
  Administered 2014-07-24 (×2): 4 mg via ORAL
  Filled 2014-07-21 (×2): qty 1

## 2014-07-21 MED ORDER — HYDROMORPHONE HCL 1 MG/ML IJ SOLN
1.0000 mg | INTRAMUSCULAR | Status: DC | PRN
  Administered 2014-07-22 – 2014-07-23 (×5): 1 mg via INTRAVENOUS
  Filled 2014-07-21 (×6): qty 1

## 2014-07-21 MED ORDER — DOCUSATE SODIUM 100 MG PO CAPS: 100.0000 mg | ORAL_CAPSULE | Freq: Two times a day (BID) | ORAL | Status: DC | PRN

## 2014-07-21 MED ORDER — FENTANYL CITRATE 0.05 MG/ML IJ SOLN
INTRAMUSCULAR | Status: DC | PRN
  Administered 2014-07-21 (×7): 50 ug via INTRAVENOUS

## 2014-07-21 MED ORDER — RIVAROXABAN 10 MG PO TABS
10.0000 mg | ORAL_TABLET | Freq: Every day | ORAL | Status: DC
  Administered 2014-07-22 – 2014-07-25 (×4): 10 mg via ORAL
  Filled 2014-07-21 (×6): qty 1

## 2014-07-21 MED ORDER — ACETAMINOPHEN 325 MG PO TABS
650.0000 mg | ORAL_TABLET | Freq: Four times a day (QID) | ORAL | Status: DC | PRN
  Administered 2014-07-23 – 2014-07-24 (×3): 650 mg via ORAL
  Filled 2014-07-21 (×4): qty 2

## 2014-07-21 MED ORDER — ACETAMINOPHEN 10 MG/ML IV SOLN
1000.0000 mg | Freq: Once | INTRAVENOUS | Status: AC
  Administered 2014-07-21: 1000 mg via INTRAVENOUS
  Filled 2014-07-21: qty 100

## 2014-07-21 MED ORDER — SODIUM CHLORIDE 0.45 % IV SOLN
INTRAVENOUS | Status: AC
  Administered 2014-07-21 – 2014-07-22 (×2): via INTRAVENOUS

## 2014-07-21 MED ORDER — LIDOCAINE HCL (CARDIAC) 20 MG/ML IV SOLN
INTRAVENOUS | Status: DC | PRN
  Administered 2014-07-21: 100 mg via INTRAVENOUS

## 2014-07-21 MED ORDER — MENTHOL 3 MG MT LOZG
1.0000 | LOZENGE | OROMUCOSAL | Status: DC | PRN
  Filled 2014-07-21: qty 9

## 2014-07-21 MED ORDER — CEFAZOLIN SODIUM-DEXTROSE 2-3 GM-% IV SOLR
INTRAVENOUS | Status: AC
  Filled 2014-07-21: qty 50

## 2014-07-21 MED ORDER — METOCLOPRAMIDE HCL 5 MG/ML IJ SOLN: 5.0000 mg | Freq: Three times a day (TID) | INTRAMUSCULAR | Status: DC | PRN

## 2014-07-21 MED ORDER — KETOROLAC TROMETHAMINE 30 MG/ML IJ SOLN
INTRAMUSCULAR | Status: AC
  Filled 2014-07-21: qty 1

## 2014-07-21 MED ORDER — MIDAZOLAM HCL 2 MG/2ML IJ SOLN
INTRAMUSCULAR | Status: AC
  Filled 2014-07-21: qty 2

## 2014-07-21 MED ORDER — SPIRONOLACTONE 25 MG PO TABS
25.0000 mg | ORAL_TABLET | Freq: Every day | ORAL | Status: DC
  Administered 2014-07-21 – 2014-07-25 (×5): 25 mg via ORAL
  Filled 2014-07-21 (×5): qty 1

## 2014-07-21 MED ORDER — HYDROMORPHONE HCL 2 MG/ML IJ SOLN
INTRAMUSCULAR | Status: AC
  Filled 2014-07-21: qty 1

## 2014-07-21 MED ORDER — DEXTROSE 5 % IV SOLN
500.0000 mg | Freq: Four times a day (QID) | INTRAVENOUS | Status: DC | PRN
  Administered 2014-07-21: 500 mg via INTRAVENOUS
  Filled 2014-07-21: qty 5

## 2014-07-21 MED ORDER — ALUM & MAG HYDROXIDE-SIMETH 200-200-20 MG/5ML PO SUSP: 30.0000 mL | ORAL | Status: DC | PRN

## 2014-07-21 MED ORDER — PROMETHAZINE HCL 25 MG/ML IJ SOLN: 6.2500 mg | INTRAMUSCULAR | Status: DC | PRN

## 2014-07-21 MED ORDER — METOCLOPRAMIDE HCL 10 MG PO TABS
5.0000 mg | ORAL_TABLET | Freq: Three times a day (TID) | ORAL | Status: DC | PRN
  Administered 2014-07-24: 10 mg via ORAL
  Filled 2014-07-21: qty 1

## 2014-07-21 MED ORDER — KETOROLAC TROMETHAMINE 30 MG/ML IJ SOLN
INTRAMUSCULAR | Status: DC | PRN
  Administered 2014-07-21: 30 mg via INTRAVENOUS

## 2014-07-21 MED ORDER — ONDANSETRON HCL 4 MG/2ML IJ SOLN
INTRAMUSCULAR | Status: AC
  Filled 2014-07-21: qty 2

## 2014-07-21 MED ORDER — METHOCARBAMOL 500 MG PO TABS
500.0000 mg | ORAL_TABLET | Freq: Four times a day (QID) | ORAL | Status: DC | PRN
  Administered 2014-07-22 – 2014-07-25 (×6): 500 mg via ORAL
  Filled 2014-07-21 (×6): qty 1

## 2014-07-21 MED ORDER — RIVAROXABAN 10 MG PO TABS: 10.0000 mg | ORAL_TABLET | Freq: Every day | ORAL | Status: DC

## 2014-07-21 MED ORDER — BUPIVACAINE-EPINEPHRINE (PF) 0.5% -1:200000 IJ SOLN
INTRAMUSCULAR | Status: AC
  Filled 2014-07-21: qty 30

## 2014-07-21 MED ORDER — LACTATED RINGERS IV SOLN
INTRAVENOUS | Status: DC
Start: 2014-07-21 — End: 2014-07-21
  Administered 2014-07-21 (×3): via INTRAVENOUS

## 2014-07-21 MED ORDER — SENNOSIDES-DOCUSATE SODIUM 8.6-50 MG PO TABS: 1.0000 | ORAL_TABLET | Freq: Every evening | ORAL | Status: DC | PRN

## 2014-07-21 MED ORDER — HYDROMORPHONE HCL 1 MG/ML IJ SOLN
INTRAMUSCULAR | Status: AC
  Administered 2014-07-21: 1 mg
  Filled 2014-07-21: qty 1

## 2014-07-21 MED ORDER — PROPOFOL 10 MG/ML IV BOLUS
INTRAVENOUS | Status: DC | PRN
  Administered 2014-07-21: 200 mg via INTRAVENOUS

## 2014-07-21 MED ORDER — CEFAZOLIN SODIUM-DEXTROSE 2-3 GM-% IV SOLR
2.0000 g | INTRAVENOUS | Status: AC
Start: 2014-07-21 — End: 2014-07-21
  Administered 2014-07-21: 2 g via INTRAVENOUS

## 2014-07-21 MED ORDER — METHOCARBAMOL 500 MG PO TABS: 500.0000 mg | ORAL_TABLET | Freq: Three times a day (TID) | ORAL | Status: DC | PRN

## 2014-07-21 MED ORDER — NEOSTIGMINE METHYLSULFATE 10 MG/10ML IV SOLN
INTRAVENOUS | Status: AC
  Filled 2014-07-21: qty 1

## 2014-07-21 MED ORDER — MAGNESIUM CITRATE PO SOLN
1.0000 | Freq: Once | ORAL | Status: AC | PRN
Start: 2014-07-21 — End: 2014-07-21

## 2014-07-21 MED ORDER — PROPOFOL 10 MG/ML IV BOLUS
INTRAVENOUS | Status: AC
  Filled 2014-07-21: qty 20

## 2014-07-21 MED ORDER — PANTOPRAZOLE SODIUM 40 MG PO TBEC
40.0000 mg | DELAYED_RELEASE_TABLET | Freq: Every day | ORAL | Status: DC
  Administered 2014-07-22 – 2014-07-25 (×4): 40 mg via ORAL
  Filled 2014-07-21 (×4): qty 1

## 2014-07-21 MED ORDER — BUPIVACAINE LIPOSOME 1.3 % IJ SUSP
20.0000 mL | Freq: Once | INTRAMUSCULAR | Status: AC
  Administered 2014-07-21: 20 mL
  Filled 2014-07-21: qty 20

## 2014-07-21 MED ORDER — SODIUM CHLORIDE 0.9 % IR SOLN
Status: DC | PRN
  Administered 2014-07-21 (×2): 1000 mL

## 2014-07-21 MED ORDER — MOMETASONE FURO-FORMOTEROL FUM 100-5 MCG/ACT IN AERO
2.0000 | INHALATION_SPRAY | Freq: Two times a day (BID) | RESPIRATORY_TRACT | Status: DC
  Administered 2014-07-22 – 2014-07-25 (×4): 2 via RESPIRATORY_TRACT
  Filled 2014-07-21 (×2): qty 8.8

## 2014-07-21 MED ORDER — SUCCINYLCHOLINE CHLORIDE 20 MG/ML IJ SOLN
INTRAMUSCULAR | Status: DC | PRN
  Administered 2014-07-21: 120 mg via INTRAVENOUS

## 2014-07-21 MED ORDER — LIDOCAINE HCL (CARDIAC) 20 MG/ML IV SOLN
INTRAVENOUS | Status: AC
  Filled 2014-07-21: qty 5

## 2014-07-21 MED ORDER — DEXAMETHASONE SODIUM PHOSPHATE 10 MG/ML IJ SOLN
INTRAMUSCULAR | Status: DC | PRN
  Administered 2014-07-21: 10 mg via INTRAVENOUS

## 2014-07-21 MED ORDER — DOCUSATE SODIUM 100 MG PO CAPS
100.0000 mg | ORAL_CAPSULE | Freq: Two times a day (BID) | ORAL | Status: DC
  Administered 2014-07-21 – 2014-07-25 (×7): 100 mg via ORAL

## 2014-07-21 MED ORDER — ROCURONIUM BROMIDE 100 MG/10ML IV SOLN
INTRAVENOUS | Status: AC
  Filled 2014-07-21: qty 1

## 2014-07-21 MED ORDER — ROCURONIUM BROMIDE 100 MG/10ML IV SOLN
INTRAVENOUS | Status: DC | PRN
  Administered 2014-07-21: 50 mg via INTRAVENOUS

## 2014-07-21 MED ORDER — ONDANSETRON HCL 4 MG/2ML IJ SOLN
INTRAMUSCULAR | Status: DC | PRN
  Administered 2014-07-21: 4 mg via INTRAVENOUS

## 2014-07-21 MED ORDER — MIDAZOLAM HCL 5 MG/5ML IJ SOLN
INTRAMUSCULAR | Status: DC | PRN
  Administered 2014-07-21: 2 mg via INTRAVENOUS

## 2014-07-21 MED ORDER — MONTELUKAST SODIUM 10 MG PO TABS
10.0000 mg | ORAL_TABLET | Freq: Every day | ORAL | Status: DC
  Administered 2014-07-22 – 2014-07-24 (×3): 10 mg via ORAL
  Filled 2014-07-21 (×5): qty 1

## 2014-07-21 MED ORDER — FENTANYL CITRATE 0.05 MG/ML IJ SOLN
INTRAMUSCULAR | Status: AC
Start: 2014-07-21 — End: 2014-07-21
  Filled 2014-07-21: qty 5

## 2014-07-21 MED ORDER — HYDROMORPHONE HCL 1 MG/ML IJ SOLN
INTRAMUSCULAR | Status: DC | PRN
  Administered 2014-07-21 (×2): 0.5 mg via INTRAVENOUS
  Administered 2014-07-21: 1 mg via INTRAVENOUS
  Administered 2014-07-21: 0.5 mg via INTRAVENOUS
  Administered 2014-07-21: 1 mg via INTRAVENOUS
  Administered 2014-07-21: 0.5 mg via INTRAVENOUS

## 2014-07-21 MED ORDER — KETAMINE HCL 10 MG/ML IJ SOLN
INTRAMUSCULAR | Status: DC | PRN
  Administered 2014-07-21 (×4): 20 mg via INTRAVENOUS

## 2014-07-21 MED ORDER — BUPIVACAINE-EPINEPHRINE (PF) 0.25% -1:200000 IJ SOLN
INTRAMUSCULAR | Status: AC
  Filled 2014-07-21: qty 30

## 2014-07-21 MED ORDER — SODIUM CHLORIDE 0.9 % IR SOLN
Status: AC
  Filled 2014-07-21: qty 1

## 2014-07-21 MED ORDER — DEXAMETHASONE SODIUM PHOSPHATE 10 MG/ML IJ SOLN
INTRAMUSCULAR | Status: AC
  Filled 2014-07-21: qty 1

## 2014-07-21 SURGICAL SUPPLY — 72 items
BAG SPEC THK2 15X12 ZIP CLS (MISCELLANEOUS)
BAG ZIPLOCK 12X15 (MISCELLANEOUS) IMPLANT
BANDAGE ELASTIC 4 VELCRO ST LF (GAUZE/BANDAGES/DRESSINGS) ×3 IMPLANT
BANDAGE ELASTIC 6 VELCRO ST LF (GAUZE/BANDAGES/DRESSINGS) ×3 IMPLANT
BANDAGE ESMARK 6X9 LF (GAUZE/BANDAGES/DRESSINGS) ×1 IMPLANT
BLADE SAG 18X100X1.27 (BLADE) ×3 IMPLANT
BLADE SAW SGTL 13.0X1.19X90.0M (BLADE) ×3 IMPLANT
BNDG CMPR 9X6 STRL LF SNTH (GAUZE/BANDAGES/DRESSINGS) ×1
BNDG ESMARK 6X9 LF (GAUZE/BANDAGES/DRESSINGS) ×3
CAPT RP KNEE ×2 IMPLANT
CEMENT HV SMART SET (Cement) ×6 IMPLANT
CHLORAPREP W/TINT 26ML (MISCELLANEOUS) IMPLANT
CLOSURE WOUND 1/2 X4 (GAUZE/BANDAGES/DRESSINGS)
CLOTH 2% CHLOROHEXIDINE 3PK (PERSONAL CARE ITEMS) ×3 IMPLANT
CUFF TOURN SGL QUICK 34 (TOURNIQUET CUFF) ×3
CUFF TRNQT CYL 34X4X40X1 (TOURNIQUET CUFF) ×1 IMPLANT
DRAPE INCISE IOBAN 66X45 STRL (DRAPES) ×2 IMPLANT
DRAPE POUCH INSTRU U-SHP 10X18 (DRAPES) ×3 IMPLANT
DRAPE SHEET LG 3/4 BI-LAMINATE (DRAPES) ×3 IMPLANT
DRAPE SPLIT 77X100IN (DRAPES) ×6 IMPLANT
DRAPE U-SHAPE 47X51 STRL (DRAPES) ×3 IMPLANT
DRSG ADAPTIC 3X8 NADH LF (GAUZE/BANDAGES/DRESSINGS) IMPLANT
DRSG AQUACEL AG ADV 3.5X10 (GAUZE/BANDAGES/DRESSINGS) ×2 IMPLANT
DRSG PAD ABDOMINAL 8X10 ST (GAUZE/BANDAGES/DRESSINGS) IMPLANT
DRSG TEGADERM 4X4.75 (GAUZE/BANDAGES/DRESSINGS) ×2 IMPLANT
DURAPREP 26ML APPLICATOR (WOUND CARE) ×3 IMPLANT
ELECT REM PT RETURN 9FT ADLT (ELECTROSURGICAL) ×3
ELECTRODE REM PT RTRN 9FT ADLT (ELECTROSURGICAL) ×1 IMPLANT
EVACUATOR 1/8 PVC DRAIN (DRAIN) ×3 IMPLANT
FACESHIELD WRAPAROUND (MASK) ×15 IMPLANT
FACESHIELD WRAPAROUND OR TEAM (MASK) ×5 IMPLANT
GAUZE SPONGE 2X2 8PLY STRL LF (GAUZE/BANDAGES/DRESSINGS) IMPLANT
GAUZE SPONGE 4X4 12PLY STRL (GAUZE/BANDAGES/DRESSINGS) IMPLANT
GLOVE BIOGEL PI IND STRL 7.5 (GLOVE) ×1 IMPLANT
GLOVE BIOGEL PI IND STRL 8 (GLOVE) ×1 IMPLANT
GLOVE BIOGEL PI INDICATOR 7.5 (GLOVE) ×2
GLOVE BIOGEL PI INDICATOR 8 (GLOVE) ×2
GLOVE SURG SS PI 7.5 STRL IVOR (GLOVE) ×3 IMPLANT
GLOVE SURG SS PI 8.0 STRL IVOR (GLOVE) ×6 IMPLANT
GOWN STRL REUS W/TWL XL LVL3 (GOWN DISPOSABLE) ×6 IMPLANT
HANDPIECE INTERPULSE COAX TIP (DISPOSABLE) ×3
IMMOBILIZER KNEE 20 (SOFTGOODS) ×5 IMPLANT
IMMOBILIZER KNEE 20 THIGH 36 (SOFTGOODS) ×1 IMPLANT
KIT BASIN OR (CUSTOM PROCEDURE TRAY) ×3 IMPLANT
MANIFOLD NEPTUNE II (INSTRUMENTS) ×3 IMPLANT
NDL SAFETY ECLIPSE 18X1.5 (NEEDLE) ×1 IMPLANT
NEEDLE HYPO 18GX1.5 SHARP (NEEDLE) ×3
NS IRRIG 1000ML POUR BTL (IV SOLUTION) IMPLANT
PACK TOTAL JOINT (CUSTOM PROCEDURE TRAY) ×3 IMPLANT
PADDING CAST COTTON 6X4 STRL (CAST SUPPLIES) IMPLANT
POSITIONER SURGICAL ARM (MISCELLANEOUS) ×3 IMPLANT
SET HNDPC FAN SPRY TIP SCT (DISPOSABLE) ×1 IMPLANT
SPONGE GAUZE 2X2 STER 10/PKG (GAUZE/BANDAGES/DRESSINGS)
SPONGE SURGIFOAM ABS GEL 100 (HEMOSTASIS) IMPLANT
STAPLER VISISTAT (STAPLE) ×2 IMPLANT
STRIP CLOSURE SKIN 1/2X4 (GAUZE/BANDAGES/DRESSINGS) IMPLANT
SUCTION FRAZIER 12FR DISP (SUCTIONS) ×3 IMPLANT
SUT BONE WAX W31G (SUTURE) IMPLANT
SUT MNCRL AB 4-0 PS2 18 (SUTURE) IMPLANT
SUT VIC AB 1 CT1 27 (SUTURE) ×6
SUT VIC AB 1 CT1 27XBRD ANTBC (SUTURE) ×1 IMPLANT
SUT VIC AB 2-0 CT1 27 (SUTURE) ×9
SUT VIC AB 2-0 CT1 TAPERPNT 27 (SUTURE) ×3 IMPLANT
SUT VLOC 180 0 24IN GS25 (SUTURE) ×5 IMPLANT
SYR 20CC LL (SYRINGE) ×3 IMPLANT
SYRINGE 60CC LL (MISCELLANEOUS) ×2 IMPLANT
TOWEL OR 17X26 10 PK STRL BLUE (TOWEL DISPOSABLE) ×3 IMPLANT
TOWEL OR NON WOVEN STRL DISP B (DISPOSABLE) IMPLANT
TOWER CARTRIDGE SMART MIX (DISPOSABLE) ×3 IMPLANT
TRAY FOLEY CATH 14FRSI W/METER (CATHETERS) ×3 IMPLANT
WATER STERILE IRR 1500ML POUR (IV SOLUTION) ×3 IMPLANT
WRAP KNEE MAXI GEL POST OP (GAUZE/BANDAGES/DRESSINGS) ×3 IMPLANT

## 2014-07-21 NOTE — Anesthesia Preprocedure Evaluation (Addendum)
Anesthesia Evaluation  Patient identified by MRN, date of birth, ID band Patient awake    Reviewed: Allergy & Precautions, H&P , NPO status , Patient's Chart, lab work & pertinent test results  Airway Mallampati: II TM Distance: >3 FB Neck ROM: Full    Dental no notable dental hx.    Pulmonary asthma , pneumonia -, resolved, COPD COPD inhaler, former smoker,  breath sounds clear to auscultation  Pulmonary exam normal       Cardiovascular Exercise Tolerance: Good hypertension, Pt. on medications Rhythm:Regular Rate:Normal     Neuro/Psych negative neurological ROS  negative psych ROS   GI/Hepatic Neg liver ROS, GERD-  Medicated,  Endo/Other  negative endocrine ROS  Renal/GU negative Renal ROS  negative genitourinary   Musculoskeletal  (+) Arthritis -,   Abdominal   Peds negative pediatric ROS (+)  Hematology negative hematology ROS (+)   Anesthesia Other Findings   Reproductive/Obstetrics negative OB ROS                          Anesthesia Physical Anesthesia Plan  ASA: II  Anesthesia Plan: General   Post-op Pain Management:    Induction: Intravenous  Airway Management Planned: Oral ETT  Additional Equipment:   Intra-op Plan:   Post-operative Plan: Extubation in OR  Informed Consent: I have reviewed the patients History and Physical, chart, labs and discussed the procedure including the risks, benefits and alternatives for the proposed anesthesia with the patient or authorized representative who has indicated his/her understanding and acceptance.   Dental advisory given  Plan Discussed with: CRNA  Anesthesia Plan Comments: (Discussed spinal and general. Consents to general.)       Anesthesia Quick Evaluation

## 2014-07-21 NOTE — Brief Op Note (Signed)
07/21/2014  9:57 AM  PATIENT:  Charles Mccoy  63 y.o. male  PRE-OPERATIVE DIAGNOSIS:  DEGENERATIVE JOINT DISEASE LEFT KNEE  POST-OPERATIVE DIAGNOSIS:  DEGENERATIVE JOINT DISEASE LEFT KNEE  PROCEDURE:  Procedure(s): LEFT TOTAL KNEE ARTHROPLASTY (Left)  SURGEON:  Surgeon(s) and Role:    * Johnn Hai, MD - Primary  PHYSICIAN ASSISTANT:   ASSISTANTS: [place]   ANESTHESIA:   general  EBL:  Total I/O In: 1000 [I.V.:1000] Out: 250 [Urine:200; Blood:50]  BLOOD ADMINISTERED:none  DRAINS: none   LOCAL MEDICATIONS USED:  MARCAINE     SPECIMEN:  No Specimen  DISPOSITION OF SPECIMEN:  N/A  COUNTS:  YES  TOURNIQUET:  * Missing tourniquet times found for documented tourniquets in log:  [id number] *  DICTATION: .Other Dictation: Dictation Number [id number]  PLAN OF CARE: Admit to inpatient   PATIENT DISPOSITION:  PACU - hemodynamically stable.   Delay start of Pharmacological VTE agent (>24hrs) due to surgical blood loss or risk of bleeding: no

## 2014-07-21 NOTE — Anesthesia Postprocedure Evaluation (Signed)
  Anesthesia Post-op Note  Patient: Charles Mccoy  Procedure(s) Performed: Procedure(s) (LRB): LEFT TOTAL KNEE ARTHROPLASTY (Left)  Patient Location: PACU  Anesthesia Type: General  Level of Consciousness: awake and alert   Airway and Oxygen Therapy: Patient Spontanous Breathing  Post-op Pain: mild  Post-op Assessment: Post-op Vital signs reviewed, Patient's Cardiovascular Status Stable, Respiratory Function Stable, Patent Airway and No signs of Nausea or vomiting  Last Vitals:  Filed Vitals:   07/21/14 1401  BP: 144/83  Pulse: 86  Temp: 37.3 C  Resp: 16    Post-op Vital Signs: stable   Complications: No apparent anesthesia complications

## 2014-07-21 NOTE — Progress Notes (Signed)
to cough and deep breathe

## 2014-07-21 NOTE — Interval H&P Note (Signed)
History and Physical Interval Note:  07/21/2014 7:24 AM  Charles Mccoy  has presented today for surgery, with the diagnosis of DJD LEFT KNEE  The various methods of treatment have been discussed with the patient and family. After consideration of risks, benefits and other options for treatment, the patient has consented to  Procedure(s): LEFT TOTAL KNEE ARTHROPLASTY (Left) as a surgical intervention .  The patient's history has been reviewed, patient examined, no change in status, stable for surgery.  I have reviewed the patient's chart and labs.  Questions were answered to the patient's satisfaction.     Shaune Malacara C

## 2014-07-21 NOTE — Discharge Instructions (Signed)
Elevate leg above heart 6x a day for 73minutes each Use knee immobilizer while walking until can SLR x 10 Use knee immobilizer in bed to keep knee in extension Aquacel dressing may remain in place until follow up. May shower with aquacel dressing in place. After 7 days, you may remove aquacel dressing if peeling off or may remove if saturated. Place new dressing with gauze and tape or ACE bandage which should be kept clean and dry and changed daily. Follow up 10-14 days post-op for xrays and staple removal

## 2014-07-21 NOTE — Transfer of Care (Signed)
Immediate Anesthesia Transfer of Care Note  Patient: Charles Mccoy  Procedure(s) Performed: Procedure(s) (LRB): LEFT TOTAL KNEE ARTHROPLASTY (Left)  Patient Location: PACU  Anesthesia Type: General  Level of Consciousness: sedated, patient cooperative and responds to stimulation  Airway & Oxygen Therapy: Patient Spontanous Breathing and Patient connected to face mask oxgen  Post-op Assessment: Report given to PACU RN and Post -op Vital signs reviewed and stable  Post vital signs: Reviewed and stable  Complications: No apparent anesthesia complications

## 2014-07-21 NOTE — H&P (View-Only) (Signed)
Charles Mccoy DOB: 08-24-1951 Married / Language: [name] / Race: White Male  H&P date: 07/12/14  Chief complaint: Left knee pain  History of Present Illness The patient is a 63 year old male who comes in today for a preoperative history and physical. The patient is scheduled for a left total knee arthroplasty to be performed by Dr. Johnn Hai, MD at [facility] on 07/21/14. Prosper has had years of B/L knee pain, left consistently worse than right, with ongoing pain, swelling, stiffness, progressively worsening symptoms now limiting ADL's. Hx of prior arthroscopy on both knees. Refractory to viscosupplementation, steroid injections, NSAIDs, pain meds, HEP, quad strengthening, activity modifications, relative rest.  Dr. Tonita Cong and the patient mutually agreed to proceed with a total knee replacement. Risks and benefits of the procedure were discussed including stiffness, suboptimal range of motion, persistent pain, infection requiring removal of prosthesis and reinsertion, need for prophylactic antibiotics in the future, for example, dental procedures, possible need for manipulation, revision in the future and also anesthetic complications including DVT, PE, etc. We discussed the perioperative course, time in the hospital, postoperative recovery and the need for elevation to control swelling. We also discussed the predicted range of motion and the probability that squatting and kneeling would be unobtainable in the future. In addition, postoperative anticoagulation was discussed. We have obtained preoperative medical clearance as necessary. Provided her illustrated handout and discussed it in detail. They will enroll in the total joint replacement educational forum at the hospital.  His [facility] pre-op appt is scheduled for 10/6.  Allergies No Known Drug Allergies09/16/2013  Family History Depression mother Osteoporosis mother Diabetes Mellitus grandfather mothers side Drug / Alcohol  Addiction father Heart Disease Maternal Grandfather, Paternal Jon Gills. grandfather mothers side Cancer Father. father Osteoarthritis Mother. mother Hypertension Maternal Grandmother, Mother. mother First Degree Relatives  Social History Tobacco use Former smoker. 04/06/2014: smoke(d) 1 pack(s) per day former smoker Children 2 Current drinker 04/06/2014: Currently drinks beer, wine and hard liquor 5-7 times per week Current work status working full time Exercise Exercises weekly; does running / walking Exercises daily; does individual sport Living situation live with spouse. 15 steps to enter home. Marital status married No history of drug/alcohol rehab Not under pain contract Number of flights of stairs before winded 4-5 greater than 5 Tobacco / smoke exposure 04/06/2014: yes outdoors only yes outdoors only Drug/Alcohol Rehab (Currently) no Alcohol use current drinker; drinks beer; 8-82 per week Illicit drug use no Previously in rehab no Pain Contract no Most recent primary occupation Sales executive [employer] short term stay at [facility] then return home and start outpatient PT here at [facility]  Medication History Montelukast Sodium (10MG  Tablet, Oral) Active. AmLODIPine Besylate (10MG  Tablet, Oral daily) Active. CeleBREX (200MG  Capsule, Oral) Active. Omeprazole (20MG  Capsule DR, Oral) Active. Spironolactone (25MG  Tablet, Oral) Active. ProAir HFA (108 (90 Base)MCG/ACT Aerosol Soln, Inhalation) Active. Advair Diskus (250-50MCG/DOSE Aero Pow Br Act, Inhalation) Active. Tylenol Arthritis Pain (650MG  Tablet ER, Oral as needed) Active. Medications Reconciled  Past Surgical History  Arthroscopy of Knee bilateral  Past Medical Hx Asthma Chronic Obstructive Lung Disease High blood pressure Osteoarthritis Other disease, cancer, significant illness Allergies Pet/Grass  Review of Systems    General Not Present- Chills, Fatigue, Fever, Memory Loss, Night Sweats, Weight Gain and Weight Loss. Skin Not Present- Eczema, Hives, Itching, Lesions and Rash. HEENT Not Present- Dentures, Double Vision, Headache, Hearing Loss, Tinnitus and Visual Loss. Respiratory Not Present- Allergies, Chronic Cough,  Coughing up blood, Shortness of breath at rest and Shortness of breath with exertion. Cardiovascular Not Present- Chest Pain, Difficulty Breathing Lying Down, Murmur, Palpitations, Racing/skipping heartbeats and Swelling. Gastrointestinal Not Present- Abdominal Pain, Bloody Stool, Constipation, Diarrhea, Difficulty Swallowing, Heartburn, Jaundice, Loss of appetitie, Nausea and Vomiting. Male Genitourinary Not Present- Blood in Urine, Discharge, Flank Pain, Incontinence, Painful Urination, Urgency, Urinary frequency, Urinary Retention, Urinating at Night and Weak urinary stream. Musculoskeletal Present- Joint Pain, Joint Stiffness, Joint Swelling and Morning Stiffness. Not Present- Back Pain, Muscle Pain, Muscle Weakness and Spasms. Neurological Not Present- Blackout spells, Difficulty with balance, Dizziness, Paralysis, Tremor and Weakness. Psychiatric Not Present- Insomnia.  Physical Exam General Mental Status -Alert, cooperative and good historian. General Appearance-pleasant, Not in acute distress. Orientation-Oriented X3. Build & Nutrition-Well nourished and Well developed.  Head and Neck Head-normocephalic, atraumatic . Neck Global Assessment - supple, no bruit auscultated on the right, no bruit auscultated on the left.  Eye Pupil - Bilateral-Regular and Round. Motion - Bilateral-EOMI.  Chest and Lung Exam Auscultation Breath sounds - clear at anterior chest wall and clear at posterior chest wall. Adventitious sounds - No Adventitious sounds.  Cardiovascular Auscultation Rhythm - Regular rate and rhythm. Heart Sounds - S1 WNL and S2 WNL. Murmurs & Other Heart  Sounds - Auscultation of the heart reveals - No Murmurs.  Abdomen Palpation/Percussion Tenderness - Abdomen is non-tender to palpation. Rigidity (guarding) - Abdomen is soft. Auscultation Auscultation of the abdomen reveals - Bowel sounds normal.  Male Genitourinary Note: Not done, not pertinent to present illness  Musculoskeletal Note: On exam varus deformity. Tender along the medial joint line. Patellofemoral pain with compression. Pain when he ambulates. No instability to varus/valgus stress.  Imaging AP standing and lateral xrays show bone on bone arthrosis patellofemoral, medial compartment B/L knees  Assessment & Plan Left knee DJD  Pt with end-stage B/L knee DJD, bone-on-bone, refractory to conservative tx, scheduled for total knee replacement by Dr. Tonita Cong on 07/21/14. He would like to proceed with the left knee first as this has consistently been more painful for him for many years. Will proceed with Left TKA. We again discussed the procedure itself as well as risks, complications and alternatives, including but not limited to DVT, PE, infx, bleeding, failure of procedure, need for secondary procedure including manipulation, nerve injury, ongoing pain/symptoms, anesthesia risk, even stroke or death. Also discussed typical post-op protocols, activity restrictions, need for PT, flexion/extension exercises, time out of work. Discussed need for DVT ppx post-op with Xarelto then ASA per protocol. Discussed dental ppx. Also discussed limitations post-operatively such as kneeling and squatting. All questions were answered. Patient desires to proceed with surgery as scheduled. Will hold ASA and NSAIDs accordingly including celebrex. Will remain NPO after MN night before surgery. Will present to [facility] for pre-op testing as scheduled later today. Plan Xarelto 2 weeks post-op for DVT ppx then ASA. Plan ES Norco/Percocet prn pain, Robaxin, Colace. Plan short term stay at [facility] post-op for  continued PT prior to his return home then setting up outpt PT vs. HHPT depending on his progress. Will follow up 10-14 days post-op for staple removal and xrays.  Plan LEFT total knee replacement  Signed electronically by Lacie Draft, PA-C for Dr. Tonita Cong

## 2014-07-21 NOTE — Evaluation (Signed)
Physical Therapy Evaluation Patient Details Name: Charles Mccoy MRN: [id number] DOB: March 27, 1951 Today's Date: 07/21/2014   History of Present Illness  L TKR  Clinical Impression  Pt s/p L TKR presents with decreased L LE strength/ROM and post op pain limiting functional mobility.  Pt would benefit from follow up rehab at SNF level to maximize IND and safety prior to return home with ltd assist.    Follow Up Recommendations SNF    Equipment Recommendations  None recommended by PT    Recommendations for Other Services OT consult     Precautions / Restrictions Precautions Precautions: Knee;Fall Required Braces or Orthoses: Knee Immobilizer - Left Knee Immobilizer - Left: Discontinue once straight leg raise with < 10 degree lag Restrictions Weight Bearing Restrictions: No Other Position/Activity Restrictions: WBAT      Mobility  Bed Mobility Overal bed mobility: Needs Assistance Bed Mobility: Supine to Sit;Sit to Supine     Supine to sit: Min assist Sit to supine: Min assist   General bed mobility comments: cues for sequence and use of R LE to self assist  Transfers Overall transfer level: Needs assistance Equipment used: Rolling walker (2 wheeled) Transfers: Sit to/from Stand Sit to Stand: Min assist;From elevated surface         General transfer comment: Cues for LE management and use of UEs to self assist  Ambulation/Gait Ambulation/Gait assistance: Min assist Ambulation Distance (Feet): 48 Feet Assistive device: Rolling walker (2 wheeled) Gait Pattern/deviations: Step-to pattern;Decreased step length - right;Decreased step length - left;Shuffle;Antalgic     General Gait Details: cues for sequence, posture and position from RW  Stairs            Wheelchair Mobility    Modified Rankin (Stroke Patients Only)       Balance                                             Pertinent Vitals/Pain Pain Assessment: 0-10 Pain Score: 4   Pain Location: L knee Pain Descriptors / Indicators: Sore Pain Intervention(s): Limited activity within patient's tolerance;Monitored during session;Premedicated before session;Ice applied    Home Living Family/patient expects to be discharged to:: Skilled nursing facility Living Arrangements: Spouse/significant other                    Prior Function Level of Independence: Independent               Hand Dominance        Extremity/Trunk Assessment   Upper Extremity Assessment: Overall WFL for tasks assessed           Lower Extremity Assessment: LLE deficits/detail   LLE Deficits / Details: 2+/5 quads with AAROM at knee -10 - 50  Cervical / Trunk Assessment: Normal  Communication   Communication: No difficulties  Cognition Arousal/Alertness: Awake/alert Behavior During Therapy: WFL for tasks assessed/performed Overall Cognitive Status: Within Functional Limits for tasks assessed                      General Comments      Exercises Total Joint Exercises Ankle Circles/Pumps: AROM;Both;10 reps;Supine Quad Sets: AROM;Both;10 reps;Supine Heel Slides: AAROM;Left;10 reps;Supine Straight Leg Raises: AAROM;10 reps;Left;Supine      Assessment/Plan    PT Assessment Patient needs continued PT services  PT Diagnosis Difficulty walking   PT Problem List Decreased strength;Decreased  range of motion;Decreased activity tolerance;Decreased mobility;Decreased knowledge of use of DME;Pain  PT Treatment Interventions DME instruction;Gait training;Stair training;Functional mobility training;Therapeutic activities;Therapeutic exercise;Patient/family education   PT Goals (Current goals can be found in the Care Plan section) Acute Rehab PT Goals Patient Stated Goal: Resume previous lifestyle with decreased pain PT Goal Formulation: With patient Time For Goal Achievement: 07/28/14 Potential to Achieve Goals: Good    Frequency 7X/week   Barriers to  discharge        Co-evaluation               End of Session Equipment Utilized During Treatment: Gait belt Activity Tolerance: Patient tolerated treatment well Patient left: in bed;with call bell/phone within reach Nurse Communication: Mobility status         Time: 1537-9432 PT Time Calculation (min): 35 min   Charges:   PT Evaluation $Initial PT Evaluation Tier I: 1 Procedure PT Treatments $Gait Training: 8-22 mins $Therapeutic Exercise: 8-22 mins   PT G Codes:          Charles Mccoy 07/21/2014, 4:41 PM

## 2014-07-21 NOTE — Op Note (Signed)
NAME:  Charles Mccoy, OMDAHL NO.:  [id number]  MEDICAL RECORD NO.:  [id number]  LOCATION:  [facility]                         FACILITY:  [facility]  PHYSICIAN:  Susa Day, M.D.    DATE OF BIRTH:  Apr 12, 1951  DATE OF PROCEDURE:  07/21/2014 DATE OF DISCHARGE:                              OPERATIVE REPORT   PREOPERATIVE DIAGNOSES: 1. Degenerative joint disease. 2. Varus deformity. 3. End stage, left knee.  POSTOPERATIVE DIAGNOSES: 1. Degenerative joint disease. 2. Varus deformity. 3. End stage, left knee.  PROCEDURE PERFORMED:  Left total knee arthroplasty.  ANESTHESIA:  General.  ASSISTANT:  Lacie Draft, PA  COMPONENTS:  DePuy rotating platform, 6 femur, 6 tibia, 10 mm insert, 41 patella.  HISTORY:  63, bone-on-bone arthrosis, refractory to conservative treatment, varus deformity indicated for replacement of degenerated joint, slight flexion contracture noted.  Risk and benefits discussed including bleeding, infection, damage to neurovascular structures, DVT, PE, anesthetic complications, etc.  TECHNIQUE:  With the patient in supine position, after induction of adequate general anesthesia, 2 g Kefzol, left lower extremity was prepped and draped in usual sterile fashion.  A midline incision was made over the knee, full thickness flaps developed.  Median parapatellar arthrotomy performed.  Patella everted, knee flexed, elevated the soft tissues medially preserving the MCL. Severe tricompartmental osteoarthrosis was noted.  We removed the osteophytes with a rongeur.  We removed the remnants of medial lateral meniscus, bone on bone was noted tricompartmental.  Step drill utilized femoral canal was irrigated.  We used a 5 degree left 11 off the distal femur pin.  This was then performed, he had a very hard bone.  Size of the distal femur to be a 6 actually.  This was then pinned.  We used a measuring guide.  We lowered it 2 mm.  We then performed our cut,  was flushed with the anterior cortex.  No notching.  We then performed our distal femoral cut.  We then performed our anterior, posterior, and chamfer cuts.  Attention turned towards the tibia, was subluxed. External alignment guide used, 2 off the defect, which is posterior medially.  This was obtained bisecting the ankle parallel to the shaft, slight slope.  This was then cut.  We used our flexion and extension gaps.  They were both tight.  We therefore removed the distal 2 mm from the tibia.  Flexion, extension gap was then equivalent.  We then turned our attention towards completing the tibia.  It was subluxed, retractors used throughout the case.  The size 5, then 6.  6 was optimal and pinned just medial aspect of the tibial tubercle maximizing the surface.  Pain drilled centrally and used our punch guide.  We then turned our attention towards completing the femur.  Box cut was then utilized.  It was cut.  We used our trial femur tibia and a 10 mm insert.  Full extension and flexion.  Good stability, varus, valgus, stressing 0-30 degrees.  We then turned our attention towards the patella.  This was measured at 28 thickness, 41 size.  We planed 9.5 off that utilizing a patellar jig without difficulty.  We drilled our peg holes  medializing them.  We placed our trial patellar button.  We had excellent patellofemoral tracking.  We then removed all trials, copiously irrigated with pulsatile lavage, checked posteriorly, removed the remnants of menisci were removed, cauterized the geniculate.  The popliteus was intact. Osteophytes were noted posteriorly, removed as were loose bodies.  Knee was flexed, all surfaces thoroughly dried.  We mixed the cement in the back table in the appropriate fashion.  Injected into the tibial canal under pressure and then impacted the 6 tibial tray.  We then cemented the femur, impacted the femur with cement.  Placed a 10-mm trial insert, reduced it, and  held it with an axial load throughout this curing of the cement with the redundant cement removed.  We cemented the patellar button as well.  Following this, we had excellent patellofemoral tracking and excellent stability.  Through the insert, meticulously removed all cement.  I then copiously irrigated with antibiotic irrigation, pulsatile lavage, placed a 10-mm insert, reduced it in full extension, full flexion, good stability, varus and valgus stressing 0-30 degrees.  Negative anterior drawer.  Excellent patellofemoral tracking. Flexion-extension popping laterally.  There was small osteophyte and then was removed with a Beyer rongeur.  The bone wax placed on the cancellous surfaces.  Copiously irrigated once again.  Placed a Hemovac and brought it out through a lateral stab wound in the skin.  We used Exparel and Marcaine, injected into the periarticular tissues, reapproximated the tendon with #1 Vicryl.  Running V lock.  Subcu with 2 and skin with staples.  Sterile dressing applied.  Tourniquet deflated and adequate vascularization of lower extremity appreciated.  The patient tolerated the procedure well.  There were no complications.  Tourniquet 1 hour and 50 minutes.  Minimal blood loss.     Susa Day, M.D.     Geralynn Rile  D:  07/21/2014  T:  07/21/2014  Job:  [id number]

## 2014-07-22 ENCOUNTER — Encounter (HOSPITAL_COMMUNITY): Payer: Self-pay | Admitting: Specialist

## 2014-07-22 LAB — CBC
HCT: 36.3 % — ABNORMAL LOW (ref 39.0–52.0)
HEMOGLOBIN: 12.6 g/dL — AB (ref 13.0–17.0)
MCH: 28.8 pg (ref 26.0–34.0)
MCHC: 34.7 g/dL (ref 30.0–36.0)
MCV: 83.1 fL (ref 78.0–100.0)
Platelets: 222 10*3/uL (ref 150–400)
RBC: 4.37 MIL/uL (ref 4.22–5.81)
RDW: 13.3 % (ref 11.5–15.5)
WBC: 12.1 10*3/uL — ABNORMAL HIGH (ref 4.0–10.5)

## 2014-07-22 LAB — BASIC METABOLIC PANEL
Anion gap: 12 (ref 5–15)
BUN: 13 mg/dL (ref 6–23)
CO2: 25 meq/L (ref 19–32)
Calcium: 8.4 mg/dL (ref 8.4–10.5)
Chloride: 102 mEq/L (ref 96–112)
Creatinine, Ser: 0.81 mg/dL (ref 0.50–1.35)
GFR calc Af Amer: >90 mL/min (ref >90)
GFR calc non Af Amer: >90 mL/min (ref >90)
Glucose, Bld: 148 mg/dL — ABNORMAL HIGH (ref 70–99)
POTASSIUM: 4.5 meq/L (ref 3.7–5.3)
Sodium: 139 mEq/L (ref 137–147)

## 2014-07-22 NOTE — Plan of Care (Signed)
Problem: Consults Goal: Diagnosis- Total Joint Replacement Outcome: Completed/Met Date Met:  07/22/14 Primary Total Knee

## 2014-07-22 NOTE — Progress Notes (Signed)
Clinical Social Work Department BRIEF PSYCHOSOCIAL ASSESSMENT 07/22/2014  Patient:  Charles Mccoy, Charles Mccoy     Account Number:  [id number]     Admit date:  07/21/2014  Clinical Social Worker:  Renold Genta  Date/Time:  07/22/2014 11:50 AM  Referred by:  Physician  Date Referred:  07/22/2014 Referred for  SNF Placement   Other Referral:   Interview type:  Patient Other interview type:    PSYCHOSOCIAL DATA Living Status:  WIFE Admitted from facility:   Level of care:   Primary support name:  Elija Mccamish (wife) h#: [phone] c#: [phone] Primary support relationship to patient:  SPOUSE Degree of support available:   good    CURRENT CONCERNS Current Concerns  Post-Acute Placement   Other Concerns:    SOCIAL WORK ASSESSMENT / PLAN CSW received consult that patient will need SNF at discharge.   Assessment/plan status:  Information/Referral to [employer] Other assessment/ plan:   Information/referral to community resources:   CSW completed FL2 and faxed information to [employer] per patient's request.    PATIENT'S/FAMILY'S RESPONSE TO PLAN OF CARE: Patient informed CSW that he was pre-registered at [facility], that he has 15 stairs in his home and had already decided that he would need to go to SNF. CSW confirmed with [facility] that they would be able to take patient when ready, pending [employer] authorization. Clinicals have been submitted for authorization.       Raynaldo Opitz, [facility] Clinical Social Worker cell #: [phone]

## 2014-07-22 NOTE — Progress Notes (Signed)
Clinical Social Work Department CLINICAL SOCIAL WORK PLACEMENT NOTE 07/22/2014  Patient:  Charles Mccoy, Charles Mccoy  Account Number:  [id number] Admit date:  07/21/2014  Clinical Social Worker:  Renold Genta  Date/time:  07/22/2014 12:34 PM  Clinical Social Work is seeking post-discharge placement for this patient at the following level of care:   [place]   (*CSW will update this form in Epic as items are completed)   07/22/2014  Patient/family provided with [place] Department of Clinical Social Work's list of facilities offering this level of care within the geographic area requested by the patient (or if unable, by the patient's family).  07/22/2014  Patient/family informed of their freedom to choose among providers that offer the needed level of care, that participate in Medicare, Medicaid or managed care program needed by the patient, have an available bed and are willing to accept the patient.  07/22/2014  Patient/family informed of [facility]' ownership interest in [facility], as well as of the fact that they are under no obligation to receive care at this facility.  PASARR submitted to [employer] on 07/22/2014 PASARR number received on 07/22/2014  FL2 transmitted to all facilities in geographic area requested by pt/family on  07/22/2014 FL2 transmitted to all facilities within larger geographic area on   Patient informed that his/her managed care company has contracts with or will negotiate with  certain facilities, including the following:   BCBS     Patient/family informed of bed offers received:  07/22/2014 Patient chooses bed at [facility] Physician recommends and patient chooses bed at    Patient to be transferred to [place] on   Patient to be transferred to facility by  Patient and family notified of transfer on  Name of family member notified:    The following physician request were entered in Epic:   Additional Comments:   Raynaldo Opitz,  [facility] Social Worker cell #: [phone]

## 2014-07-22 NOTE — Progress Notes (Signed)
CSW spoke with patient, wife & Ivin Booty @ [place] re: [employer] authorization. Clinicals were submitted early this am, though usually 24-48 hour turnaround. Per Wyatt Mage states that they would not authorize for patient to be discharged over the weekend. Anticipate discharge Monday.   CSW will follow-up Monday morning for [facility] authorization.   Raynaldo Opitz, [facility] Clinical Social Worker cell #: [phone]

## 2014-07-22 NOTE — Progress Notes (Signed)
Physical Therapy Treatment Patient Details Name: Charles Mccoy MRN: [id number] DOB: [birth year] Today's Date: Aug 15, 2014    History of Present Illness L TKR    PT Comments      Follow Up Recommendations  SNF     Equipment Recommendations  None recommended by PT    Recommendations for Other Services OT consult     Precautions / Restrictions Precautions Precautions: Knee;Fall Required Braces or Orthoses: Knee Immobilizer - Left Knee Immobilizer - Left: Discontinue once straight leg raise with < 10 degree lag Restrictions Weight Bearing Restrictions: No Other Position/Activity Restrictions: WBAT    Mobility  Bed Mobility Overal bed mobility: Needs Assistance Bed Mobility: Supine to Sit;Sit to Supine     Supine to sit: Min assist Sit to supine: Min assist   General bed mobility comments: Min assist with L LE management  Transfers Overall transfer level: Needs assistance Equipment used: Rolling walker (2 wheeled) Transfers: Sit to/from Stand Sit to Stand: Min guard;From elevated surface         General transfer comment: Cues for LE management and use of UEs.  Transfer to low chair  Ambulation/Gait Ambulation/Gait assistance: Min assist;Min guard Ambulation Distance (Feet): 75 Feet Assistive device: Rolling walker (2 wheeled) Gait Pattern/deviations: Step-to pattern;Shuffle;Antalgic     General Gait Details: cues for sequence, posture and position from RW   Stairs            Wheelchair Mobility    Modified Rankin (Stroke Patients Only)       Balance                                    Cognition Arousal/Alertness: Awake/alert Behavior During Therapy: WFL for tasks assessed/performed Overall Cognitive Status: Within Functional Limits for tasks assessed                      Exercises      General Comments        Pertinent Vitals/Pain Pain Assessment: 0-10 Pain Score: 3  Pain Location: L knee Pain Descriptors /  Indicators: Aching;Sore Pain Intervention(s): Limited activity within patient's tolerance;Monitored during session;Premedicated before session;Ice applied    Home Living Family/patient expects to be discharged to:: Skilled nursing facility Living Arrangements: Spouse/significant other                  Prior Function            PT Goals (current goals can now be found in the care plan section) Acute Rehab PT Goals Patient Stated Goal: get back to work ASAP PT Goal Formulation: With patient Time For Goal Achievement: 07/28/14 Potential to Achieve Goals: Good Progress towards PT goals: Progressing toward goals    Frequency  7X/week    PT Plan Current plan remains appropriate    Co-evaluation             End of Session Equipment Utilized During Treatment: Gait belt;Left knee immobilizer Activity Tolerance: Patient tolerated treatment well Patient left: in bed;with call bell/phone within reach;with family/visitor present     Time: 0786-7544 PT Time Calculation (min): 34 min  Charges:  $Gait Training: 23-37 mins                    G Codes:      Mahogany Torrance August 15, 2014, 2:04 PM

## 2014-07-22 NOTE — Progress Notes (Signed)
Patient ID: Charles Mccoy, male   DOB: 09-18-1951, 63 y.o.   MRN: [id number] Subjective: 1 Day Post-Op Procedure(s) (LRB): LEFT TOTAL KNEE ARTHROPLASTY (Left) Patient reports pain as mild.    Patient has complaints of left knee pain, mild, only with movement. Feels he tolerated PT well yesterday. No other c/o.   We will start therapy today. Plan is to go to [facility] for rehab after hospital stay as he also has severe DJD in his R knee and his mobility is limited due to that.  Objective: Vital signs in last 24 hours: Temp:  [97.8 F (36.6 C)-99.1 F (37.3 C)] 99.1 F (37.3 C) (10/16 0628) Pulse Rate:  [73-90] 73 (10/16 0628) Resp:  [7-18] 18 (10/16 0628) BP: (125-153)/(65-90) 132/72 mmHg (10/16 0628) SpO2:  [91 %-100 %] 99 % (10/16 0628) Weight:  [112.5 kg (248 lb 0.3 oz)] 112.5 kg (248 lb 0.3 oz) (10/15 1200)  Intake/Output from previous day:  Intake/Output Summary (Last 24 hours) at 07/22/14 0759 Last data filed at 07/22/14 0177  Gross per 24 hour  Intake   4345 ml  Output   6380 ml  Net  -2035 ml    Intake/Output this shift:    Labs: Results for orders placed during the hospital encounter of 07/21/14  CBC      Result Value Ref Range   WBC 12.1 (*) 4.0 - 10.5 K/uL   RBC 4.37  4.22 - 5.81 MIL/uL   Hemoglobin 12.6 (*) 13.0 - 17.0 g/dL   HCT 36.3 (*) 39.0 - 52.0 %   MCV 83.1  78.0 - 100.0 fL   MCH 28.8  26.0 - 34.0 pg   MCHC 34.7  30.0 - 36.0 g/dL   RDW 13.3  11.5 - 15.5 %   Platelets 222  150 - 400 K/uL  BASIC METABOLIC PANEL      Result Value Ref Range   Sodium 139  137 - 147 mEq/L   Potassium 4.5  3.7 - 5.3 mEq/L   Chloride 102  96 - 112 mEq/L   CO2 25  19 - 32 mEq/L   Glucose, Bld 148 (*) 70 - 99 mg/dL   BUN 13  6 - 23 mg/dL   Creatinine, Ser 0.81  0.50 - 1.35 mg/dL   Calcium 8.4  8.4 - 10.5 mg/dL   GFR calc non Af Amer >90  >90 mL/min   GFR calc Af Amer >90  >90 mL/min   Anion gap 12  5 - 15  TYPE AND SCREEN      Result Value Ref Range   ABO/RH(D) A POS      Antibody Screen NEG     Sample Expiration 07/24/2014    ABO/RH      Result Value Ref Range   ABO/RH(D) A POS      Exam - Neurologically intact ABD soft Neurovascular intact Sensation intact distally Intact pulses distally Dorsiflexion/Plantar flexion intact Incision: dressing C/D/I and no drainage No cellulitis present Compartment soft no calf pain or sign of DVT Dressing - clean, dry, no drainage Motor function intact - moving foot and toes well on exam.   Assessment/Plan: 1 Day Post-Op Procedure(s) (LRB): LEFT TOTAL KNEE ARTHROPLASTY (Left)  Advance diet Up with therapy D/C IV fluids Past Medical History  Diagnosis Date  . ALLERGIC RHINITIS 06/19/2007  . ASTHMA 08/17/2008  . HYPERTENSION 06/19/2007  . OSTEOARTHRITIS 10/09/2009  . SINUSITIS 09/15/2007  . COPD (chronic obstructive pulmonary disease)   . Pneumonia  hx of   . GERD (gastroesophageal reflux disease)     DVT Prophylaxis - Xarelto Protocol Weight-Bearing as tolerated to L leg Remove Foley No vaccines. Will pull drain later today- 140cc output so far today Will discuss with Dr. Mliss Fritz, [place] Rolls. 07/22/2014, 7:59 AM

## 2014-07-22 NOTE — Evaluation (Signed)
Occupational Therapy Evaluation Patient Details Name: Charles Mccoy MRN: [id number] DOB: 08/19/1951 Today's Date: 07/22/2014    History of Present Illness L TKR   Clinical Impression   This 63 year old man was admitted for the above surgery.  He was independent with ADLs prior to admission.  Pt will benefit from skilled OT to increase safety and independence with adls.  Goals in acute are for supervision level.    Follow Up Recommendations  SNF    Equipment Recommendations  None recommended by OT    Recommendations for Other Services       Precautions / Restrictions Precautions Precautions: Knee;Fall Required Braces or Orthoses: Knee Immobilizer - Left Knee Immobilizer - Left: Discontinue once straight leg raise with < 10 degree lag Restrictions Other Position/Activity Restrictions: WBAT      Mobility Bed Mobility         Supine to sit: Min assist     General bed mobility comments: assist for LLE  Transfers   Equipment used: Rolling walker (2 wheeled) Transfers: Sit to/from Stand Sit to Stand: Min guard         General transfer comment: cues for UE placement    Balance                                            ADL Overall ADL's : Needs assistance/impaired     Grooming: Supervision/safety;Standing       Lower Body Bathing: Minimal assistance;Sit to/from stand       Lower Body Dressing: Minimal assistance;Bed level   Toilet Transfer: Minimal assistance;Stand-pivot             General ADL Comments: pt is able to complete UB ADLs with set up. He does not need reacher for pants but could benefit from sock aide vs. assistance.  Pt needed assistance to don R knee sleeve also.     Vision                     Perception     Praxis      Pertinent Vitals/Pain Pain Score: 2 Pain Location: L knee Pain Descriptors / Indicators: Sore Pain Intervention(s): Limited activity within patient's tolerance;Monitored during  session;Premedicated before session     Hand Dominance     Extremity/Trunk Assessment Upper Extremity Assessment Upper Extremity Assessment: Overall WFL for tasks assessed       Cervical / Trunk Assessment Cervical / Trunk Assessment: Normal   Communication Communication Communication: No difficulties   Cognition Arousal/Alertness: Awake/alert Behavior During Therapy: WFL for tasks assessed/performed Overall Cognitive Status: Within Functional Limits for tasks assessed                     General Comments       Exercises       Shoulder Instructions      Home Living Family/patient expects to be discharged to:: Skilled nursing facility Living Arrangements: Spouse/significant other Pt has a 3:1 at home and walk in shower. Wife has h/o TKA                                      Prior Functioning/Environment Level of Independence: Independent             OT Diagnosis: Generalized weakness  OT Problem List: Decreased strength;Decreased knowledge of use of DME or AE;Pain   OT Treatment/Interventions: Self-care/ADL training;DME and/or AE instruction;Patient/family education    OT Goals(Current goals can be found in the care plan section) Acute Rehab OT Goals Patient Stated Goal: get back to work ASAP OT Goal Formulation: With patient Time For Goal Achievement: 07/29/14 Potential to Achieve Goals: Good ADL Goals Pt Will Perform Lower Body Bathing: with supervision;with adaptive equipment;sit to/from stand Pt Will Perform Lower Body Dressing: with supervision;with adaptive equipment;sit to/from stand Pt Will Transfer to Toilet: with supervision;ambulating;bedside commode  OT Frequency: Min 2X/week   Barriers to D/C:            Co-evaluation              End of Session    Activity Tolerance: Patient tolerated treatment well Patient left:  (handed off to PT)   Time: 0867-6195 OT Time Calculation (min): 11 min Charges:  OT  General Charges $OT Visit: 1 Procedure OT Evaluation $Initial OT Evaluation Tier I: 1 Procedure G-Codes:    Andersen Mckiver 06-Aug-2014, 8:29 AM Lesle Chris, OTR/L [phone] 08-06-14

## 2014-07-22 NOTE — Discharge Summary (Addendum)
Physician Discharge Summary   Patient ID: Charles Mccoy MRN: [id number] DOB/AGE: 61-Feb-1952 61 y.o.  Admit date: 07/21/2014 Discharge date:  07/25/14  Primary Diagnosis: left knee DJD  Admission Diagnoses:  Past Medical History  Diagnosis Date  . ALLERGIC RHINITIS 06/19/2007  . ASTHMA 08/17/2008  . HYPERTENSION 06/19/2007  . OSTEOARTHRITIS 10/09/2009  . SINUSITIS 09/15/2007  . COPD (chronic obstructive pulmonary disease)   . Pneumonia     hx of   . GERD (gastroesophageal reflux disease)    Discharge Diagnoses:   Active Problems:   Left knee DJD  Estimated body mass index is 30.2 kg/(m^2) as calculated from the following:   Height as of this encounter: $RemoveBeforeD'6\' 4"'[id number]$  (1.93 m).   Weight as of this encounter: 112.5 kg (248 lb 0.3 oz).  Procedure:  Procedure(s) (LRB): LEFT TOTAL KNEE ARTHROPLASTY (Left)   Consults: None  HPI: see H&P Laboratory Data: Admission on 07/21/2014  Component Date Value Ref Range Status  . ABO/RH(D) 07/21/2014 A POS   Final  . Antibody Screen 07/21/2014 NEG   Final  . Sample Expiration 07/21/2014 07/24/2014   Final  . ABO/RH(D) 07/21/2014 A POS   Final  . WBC 07/22/2014 12.1* 4.0 - 10.5 K/uL Final  . RBC 07/22/2014 4.37  4.22 - 5.81 MIL/uL Final  . Hemoglobin 07/22/2014 12.6* 13.0 - 17.0 g/dL Final  . HCT 07/22/2014 36.3* 39.0 - 52.0 % Final  . MCV 07/22/2014 83.1  78.0 - 100.0 fL Final  . MCH 07/22/2014 28.8  26.0 - 34.0 pg Final  . MCHC 07/22/2014 34.7  30.0 - 36.0 g/dL Final  . RDW 07/22/2014 13.3  11.5 - 15.5 % Final  . Platelets 07/22/2014 222  150 - 400 K/uL Final  . Sodium 07/22/2014 139  137 - 147 mEq/L Final  . Potassium 07/22/2014 4.5  3.7 - 5.3 mEq/L Final  . Chloride 07/22/2014 102  96 - 112 mEq/L Final  . CO2 07/22/2014 25  19 - 32 mEq/L Final  . Glucose, Bld 07/22/2014 148* 70 - 99 mg/dL Final  . BUN 07/22/2014 13  6 - 23 mg/dL Final  . Creatinine, Ser 07/22/2014 0.81  0.50 - 1.35 mg/dL Final  . Calcium 07/22/2014 8.4  8.4 - 10.5 mg/dL  Final  . GFR calc non Af Amer 07/22/2014 >90  >90 mL/min Final  . GFR calc Af Amer 07/22/2014 >90  >90 mL/min Final   Comment: (NOTE)                          The eGFR has been calculated using the CKD EPI equation.                          This calculation has not been validated in all clinical situations.                          eGFR's persistently <90 mL/min signify possible Chronic Kidney                          Disease.  Georgiann Hahn gap 07/22/2014 12  5 - 15 Final  Hospital Outpatient Visit on 07/12/2014  Component Date Value Ref Range Status  . Sodium 07/12/2014 138  137 - 147 mEq/L Final  . Potassium 07/12/2014 4.5  3.7 - 5.3 mEq/L Final  . Chloride 07/12/2014 101  96 - 112  mEq/L Final  . CO2 07/12/2014 27  19 - 32 mEq/L Final  . Glucose, Bld 07/12/2014 78  70 - 99 mg/dL Final  . BUN 07/12/2014 18  6 - 23 mg/dL Final  . Creatinine, Ser 07/12/2014 1.02  0.50 - 1.35 mg/dL Final  . Calcium 07/12/2014 9.3  8.4 - 10.5 mg/dL Final  . GFR calc non Af Amer 07/12/2014 77* >90 mL/min Final  . GFR calc Af Amer 07/12/2014 90* >90 mL/min Final   Comment: (NOTE)                          The eGFR has been calculated using the CKD EPI equation.                          This calculation has not been validated in all clinical situations.                          eGFR's persistently <90 mL/min signify possible Chronic Kidney                          Disease.  . Anion gap 07/12/2014 10  5 - 15 Final  . WBC 07/12/2014 6.1  4.0 - 10.5 K/uL Final  . RBC 07/12/2014 4.93  4.22 - 5.81 MIL/uL Final  . Hemoglobin 07/12/2014 14.7  13.0 - 17.0 g/dL Final  . HCT 07/12/2014 42.5  39.0 - 52.0 % Final  . MCV 07/12/2014 86.2  78.0 - 100.0 fL Final  . MCH 07/12/2014 29.8  26.0 - 34.0 pg Final  . MCHC 07/12/2014 34.6  30.0 - 36.0 g/dL Final  . RDW 07/12/2014 13.3  11.5 - 15.5 % Final  . Platelets 07/12/2014 215  150 - 400 K/uL Final  . Prothrombin Time 07/12/2014 14.1  11.6 - 15.2 seconds Final  . INR  07/12/2014 1.08  0.00 - 1.49 Final  . Color, Urine 07/12/2014 YELLOW  YELLOW Final  . APPearance 07/12/2014 CLEAR  CLEAR Final  . Specific Gravity, Urine 07/12/2014 1.019  1.005 - 1.030 Final  . pH 07/12/2014 5.5  5.0 - 8.0 Final  . Glucose, UA 07/12/2014 NEGATIVE  NEGATIVE mg/dL Final  . Hgb urine dipstick 07/12/2014 NEGATIVE  NEGATIVE Final  . Bilirubin Urine 07/12/2014 NEGATIVE  NEGATIVE Final  . Ketones, ur 07/12/2014 NEGATIVE  NEGATIVE mg/dL Final  . Protein, ur 07/12/2014 NEGATIVE  NEGATIVE mg/dL Final  . Urobilinogen, UA 07/12/2014 0.2  0.0 - 1.0 mg/dL Final  . Nitrite 07/12/2014 NEGATIVE  NEGATIVE Final  . Leukocytes, UA 07/12/2014 TRACE* NEGATIVE Final  . aPTT 07/12/2014 33  24 - 37 seconds Final  . MRSA, PCR 07/12/2014 NEGATIVE  NEGATIVE Final  . Staphylococcus aureus 07/12/2014 NEGATIVE  NEGATIVE Final   Comment:                                 The Xpert SA Assay (FDA                          approved for NASAL specimens                          in patients over 27 years of age),  is one component of                          a comprehensive surveillance                          program.  Test performance has                          been validated by [facility] for patients greater                          than or equal to 75 year old.                          It is not intended                          to diagnose infection nor to                          guide or monitor treatment.  . Squamous Epithelial / LPF 07/12/2014 RARE  RARE Final  . WBC, UA 07/12/2014 0-2  <3 WBC/hpf Final     X-Rays:Dg Chest 2 View  07/12/2014   CLINICAL DATA:  Preop for left knee replacement, history of asthma and COPD  EXAM: CHEST  2 VIEW  COMPARISON:  None.  FINDINGS: No active infiltrate or effusion is seen. Mediastinal and hilar contours are unremarkable. The heart is within normal limits in size. There are degenerative changes throughout  the mid to lower thoracic spine.  IMPRESSION: No active cardiopulmonary disease.   Electronically Signed   By: Ivar Drape M.D.   On: 07/12/2014 15:07   Dg Knee 1-2 Views Left  07/21/2014   CLINICAL DATA:  Osteoarthritis of the left knee. Status post left total knee replacement.  EXAM: LEFT KNEE - 1-2 VIEW  COMPARISON:  07/12/2014  FINDINGS: The components of total knee prosthesis appear in good position. No fractures. Soft tissue drain in place.  IMPRESSION: Satisfactory appearance of the left knee after total knee replacement.   Electronically Signed   By: Rozetta Nunnery M.D.   On: 07/21/2014 10:47   Dg Knee 1-2 Views Left  07/12/2014   CLINICAL DATA:  Preop for left total knee replacement  EXAM: LEFT KNEE - 1-2 VIEW  COMPARISON:  None.  FINDINGS: There is primarily bicompartmental degenerative joint disease of the left knee involving the medial compartment and patellofemoral compartment. There is loss of joint space sclerosis and spurring involving these compartments. No fracture is seen. No joint effusion is noted.  IMPRESSION: Bicompartmental degenerative joint disease.   Electronically Signed   By: Ivar Drape M.D.   On: 07/12/2014 15:07    EKG: Orders placed in visit on 09/06/13  . EKG 12-LEAD     Hospital Course: Carney Saxton is a 63 y.o. who was admitted to [facility]. They were brought to the operating room on 07/21/2014 and underwent Procedure(s): LEFT TOTAL KNEE ARTHROPLASTY.  Patient tolerated the procedure well and was later transferred to the recovery room and then to the orthopaedic floor for  postoperative care.  They were given PO and IV analgesics for pain control following their surgery.  They were given 24 hours of postoperative antibiotics of  Anti-infectives   Start     Dose/Rate Route Frequency Ordered Stop   07/21/14 1400  ceFAZolin (ANCEF) IVPB 2 g/50 mL premix     2 g 100 mL/hr over 30 Minutes Intravenous Every 6 hours 07/21/14 1213 07/22/14 0251   07/21/14 0927   polymyxin B 500,000 Units, bacitracin 50,000 Units in sodium chloride irrigation 0.9 % 500 mL irrigation  Status:  Discontinued       As needed 07/21/14 0927 07/21/14 1016   07/21/14 0600  ceFAZolin (ANCEF) IVPB 2 g/50 mL premix     2 g 100 mL/hr over 30 Minutes Intravenous On call to O.R. 07/21/14 7106 07/21/14 0745     and started on DVT prophylaxis in the form of Xarelto, TED hose and SCDs.   PT and OT were ordered for total joint protocol.  Discharge planning consulted to help with postop disposition and equipment needs.  Patient had a good night on the evening of surgery.  They started to get up OOB with therapy on day one. Hemovac drain was pulled without difficulty.  Continued to work with therapy into day two. By day three, the patient had progressed with therapy and meeting their goals.  Incision was healing well.  Patient was seen in rounds and was ready to go to SNF (anticipated)   Diet: Regular diet Activity:WBAT Follow-up:in 10-14 days Disposition - [facility] Discharged Condition: good      Medication List    STOP taking these medications       celecoxib 200 MG capsule  Commonly known as:  CELEBREX      TAKE these medications       albuterol 108 (90 BASE) MCG/ACT inhaler  Commonly known as:  PROVENTIL HFA;VENTOLIN HFA  Inhale 2 puffs into the lungs every 6 (six) hours as needed for wheezing or shortness of breath.     amLODipine 10 MG tablet  Commonly known as:  NORVASC  Take 10 mg by mouth every morning.     docusate sodium 100 MG capsule  Commonly known as:  COLACE  Take 1 capsule (100 mg total) by mouth 2 (two) times daily as needed for mild constipation.     Fluticasone-Salmeterol 250-50 MCG/DOSE Aepb  Commonly known as:  ADVAIR  Inhale 1 puff into the lungs 2 (two) times daily.     levocetirizine 5 MG tablet  Commonly known as:  XYZAL  Take 5 mg by mouth as needed for allergies.     methocarbamol 500 MG tablet  Commonly  known as:  ROBAXIN  Take 1 tablet (500 mg total) by mouth 3 (three) times daily between meals as needed for muscle spasms.     montelukast 10 MG tablet  Commonly known as:  SINGULAIR  Take 10 mg by mouth every morning.     omeprazole 20 MG capsule  Commonly known as:  PRILOSEC  Take 20 mg by mouth every morning.     oxyCODONE-acetaminophen 7.5-325 MG per tablet  Commonly known as:  PERCOCET  Take 1 tablet by mouth every 4 (four) hours as needed for pain.     PRESCRIPTION MEDICATION  Place 1 spray into the nose daily as needed (allergies.). Qnasal nasal spray     rivaroxaban 10 MG Tabs tablet  Commonly known as:  XARELTO  Take 1 tablet (10  mg total) by mouth daily.     spironolactone 25 MG tablet  Commonly known as:  ALDACTONE  Take 25 mg by mouth every morning.     TYLENOL ARTHRITIS PAIN 650 MG CR tablet  Generic drug:  acetaminophen  Take 1,300 mg by mouth once as needed for pain.     vardenafil 20 MG tablet  Commonly known as:  LEVITRA  Take 20 mg by mouth daily as needed for erectile dysfunction.           Follow-up Information   Follow up with BEANE,JEFFREY C, MD In 2 weeks.   Specialty:  Orthopedic Surgery   Contact information:   [address] [place] [address] [phone]       Signed: Lacie Draft, PA-C Orthopaedic Surgery 07/22/2014, 1:24 PM   No changes to D/C summary. D/C to [facility] POD #4 on 07/25/14 Lacie Draft PA-C

## 2014-07-22 NOTE — Progress Notes (Signed)
CARE MANAGEMENT NOTE 07/22/2014  Patient:  Charles Mccoy, Charles Mccoy   Account Number:  [id number]  Date Initiated:  07/22/2014  Documentation initiated by:  Petrea Fredenburg  Subjective/Objective Assessment:   total left knee arthroplasty     Action/Plan:   home when stable will follow for dme and hhc needs   Anticipated DC Date:  07/24/2014   Anticipated DC Plan:  HOME/SELF CARE  In-house referral  Clinical Social Worker      DC Planning Services  CM consult  Medication Assistance      [facility] Choice  NA   Choice offered to / List presented to:  NA   DME arranged  NA      DME agency  NA     [place] arranged  NA      [place] agency  NA   Status of service:  In process, will continue to follow Medicare Important Message given?   (If response is "NO", the following Medicare IM given date fields will be blank) Date Medicare IM given:   Medicare IM given by:   Date Additional Medicare IM given:   Additional Medicare IM given by:    Discharge Disposition:    Per UR Regulation:  Reviewed for med. necessity/level of care/duration of stay  If discussed at [place] of Stay Meetings, dates discussed:    Comments:  [id number]/Belton Peplinski,RN,BSN,CCM: Plan is to discharged to snf.

## 2014-07-22 NOTE — Progress Notes (Signed)
Physical Therapy Treatment Patient Details Name: Charles Mccoy MRN: [id number] DOB: [birth year] Today's Date: 2014-07-30    History of Present Illness L TKR    PT Comments    Motivated and progressing well  Follow Up Recommendations  SNF     Equipment Recommendations  None recommended by PT    Recommendations for Other Services OT consult     Precautions / Restrictions Precautions Precautions: Knee;Fall Required Braces or Orthoses: Knee Immobilizer - Left Knee Immobilizer - Left: Discontinue once straight leg raise with < 10 degree lag Restrictions Weight Bearing Restrictions: No Other Position/Activity Restrictions: WBAT    Mobility  Bed Mobility               General bed mobility comments: OOB with OT  Transfers Overall transfer level: Needs assistance Equipment used: Rolling walker (2 wheeled) Transfers: Sit to/from Stand Sit to Stand: Min assist         General transfer comment: Cues for LE management and use of UEs.  Transfer to low chair  Ambulation/Gait Ambulation/Gait assistance: Min assist;Min guard Ambulation Distance (Feet): 48 Feet (twice) Assistive device: Rolling walker (2 wheeled) Gait Pattern/deviations: Step-to pattern;Shuffle;Antalgic     General Gait Details: cues for sequence, posture and position from RW   Stairs            Wheelchair Mobility    Modified Rankin (Stroke Patients Only)       Balance                                    Cognition Arousal/Alertness: Awake/alert Behavior During Therapy: WFL for tasks assessed/performed Overall Cognitive Status: Within Functional Limits for tasks assessed                      Exercises Total Joint Exercises Ankle Circles/Pumps: AROM;Both;10 reps;Supine Quad Sets: AROM;Both;Supine;15 reps Heel Slides: AAROM;Left;Supine;15 reps Straight Leg Raises: AAROM;Left;Supine;15 reps    General Comments        Pertinent Vitals/Pain Pain  Assessment: 0-10 Pain Score: 4  Pain Location: L knee Pain Descriptors / Indicators: Sore Pain Intervention(s): Limited activity within patient's tolerance;Monitored during session;Premedicated before session;Ice applied    Home Living Family/patient expects to be discharged to:: Skilled nursing facility Living Arrangements: Spouse/significant other                  Prior Function            PT Goals (current goals can now be found in the care plan section) Acute Rehab PT Goals Patient Stated Goal: get back to work ASAP PT Goal Formulation: With patient Time For Goal Achievement: 07/28/14 Potential to Achieve Goals: Good Progress towards PT goals: Progressing toward goals    Frequency  7X/week    PT Plan Current plan remains appropriate    Co-evaluation             End of Session Equipment Utilized During Treatment: Gait belt Activity Tolerance: Patient tolerated treatment well Patient left: in chair;with call bell/phone within reach     Time: 0817-0847 PT Time Calculation (min): 30 min  Charges:  $Gait Training: 8-22 mins $Therapeutic Exercise: 8-22 mins                    G Codes:      Jin Capote 2014-07-30, 12:28 PM

## 2014-07-23 LAB — CBC
HEMATOCRIT: 36.2 % — AB (ref 39.0–52.0)
HEMOGLOBIN: 12.1 g/dL — AB (ref 13.0–17.0)
MCH: 28.5 pg (ref 26.0–34.0)
MCHC: 33.4 g/dL (ref 30.0–36.0)
MCV: 85.4 fL (ref 78.0–100.0)
Platelets: 199 10*3/uL (ref 150–400)
RBC: 4.24 MIL/uL (ref 4.22–5.81)
RDW: 13.7 % (ref 11.5–15.5)
WBC: 12.8 10*3/uL — ABNORMAL HIGH (ref 4.0–10.5)

## 2014-07-23 NOTE — Progress Notes (Signed)
Physical Therapy Treatment Patient Details Name: Charles Mccoy MRN: [id number] DOB: [birth year] Today's Date: 07/23/2014    History of Present Illness L TKR    PT Comments      Follow Up Recommendations  SNF     Equipment Recommendations  None recommended by PT    Recommendations for Other Services OT consult     Precautions / Restrictions Precautions Precautions: Knee;Fall Required Braces or Orthoses: Knee Immobilizer - Left Knee Immobilizer - Left: Discontinue once straight leg raise with < 10 degree lag Restrictions Weight Bearing Restrictions: No Other Position/Activity Restrictions: WBAT    Mobility  Bed Mobility Overal bed mobility: Needs Assistance Bed Mobility: Sit to Supine       Sit to supine: Min assist   General bed mobility comments: Min assist with L LE management  Transfers Overall transfer level: Needs assistance Equipment used: Rolling walker (2 wheeled) Transfers: Sit to/from Stand Sit to Stand: Min guard         General transfer comment: Cues for LE management and use of UEs.  Transfer to low chair  Ambulation/Gait Ambulation/Gait assistance: Min assist;Min guard Ambulation Distance (Feet): 65 Feet (twice) Assistive device: Rolling walker (2 wheeled) Gait Pattern/deviations: Step-to pattern;Decreased step length - right;Decreased step length - left;Shuffle Gait velocity: decreased pace   General Gait Details: cues for sequence, posture and position from [employer]            Wheelchair Mobility    Modified Rankin (Stroke Patients Only)       Balance                                    Cognition Arousal/Alertness: Awake/alert Behavior During Therapy: WFL for tasks assessed/performed Overall Cognitive Status: Within Functional Limits for tasks assessed                      Exercises Total Joint Exercises Ankle Circles/Pumps: AROM;Both;10 reps;Supine Quad Sets: AROM;Both;Supine;15 reps Heel  Slides: AAROM;Left;Supine;10 reps Straight Leg Raises: AAROM;Left;Supine;10 reps    General Comments        Pertinent Vitals/Pain Pain Assessment: 0-10 Pain Score: 6  Pain Location: L knee Pain Descriptors / Indicators: Aching;Sore Pain Intervention(s): Limited activity within patient's tolerance;Monitored during session;Premedicated before session;Patient requesting pain meds-RN notified;Ice applied    Home Living                      Prior Function            PT Goals (current goals can now be found in the care plan section) Acute Rehab PT Goals Patient Stated Goal: get back to work ASAP PT Goal Formulation: With patient Time For Goal Achievement: 07/28/14 Potential to Achieve Goals: Good Progress towards PT goals: Progressing toward goals    Frequency  7X/week    PT Plan Current plan remains appropriate    Co-evaluation             End of Session Equipment Utilized During Treatment: Gait belt;Left knee immobilizer Activity Tolerance: Patient limited by pain Patient left: with call bell/phone within reach;with nursing/sitter in room;in bed     Time: 6378-5885 PT Time Calculation (min): 51 min  Charges:  $Gait Training: 23-37 mins $Therapeutic Exercise: 8-22 mins                    G Codes:  Charles Mccoy 07/23/2014, 4:44 PM

## 2014-07-23 NOTE — Progress Notes (Signed)
Physical Therapy Treatment Patient Details Name: Charles Mccoy MRN: [id number] DOB: [birth year] Today's Date: 07/23/2014    History of Present Illness L TKR    PT Comments    Pt continues motivated but ltd this am by increased pain.  Follow Up Recommendations  SNF     Equipment Recommendations  None recommended by PT    Recommendations for Other Services OT consult     Precautions / Restrictions Precautions Precautions: Knee;Fall Required Braces or Orthoses: Knee Immobilizer - Left Knee Immobilizer - Left: Discontinue once straight leg raise with < 10 degree lag Restrictions Weight Bearing Restrictions: No Other Position/Activity Restrictions: WBAT    Mobility  Bed Mobility Overal bed mobility: Needs Assistance Bed Mobility: Supine to Sit     Supine to sit: Min assist     General bed mobility comments: Min assist with L LE management  Transfers Overall transfer level: Needs assistance Equipment used: Rolling walker (2 wheeled) Transfers: Sit to/from Stand Sit to Stand: Min assist         General transfer comment: Cues for LE management and use of UEs.  Transfer to low chair  Ambulation/Gait Ambulation/Gait assistance: Min assist Ambulation Distance (Feet): 52 Feet Assistive device: Rolling walker (2 wheeled) Gait Pattern/deviations: Step-to pattern;Decreased step length - right;Decreased step length - left;Shuffle;Trunk flexed Gait velocity: decreased pace   General Gait Details: cues for sequence, posture and position from [employer]            Wheelchair Mobility    Modified Rankin (Stroke Patients Only)       Balance                                    Cognition Arousal/Alertness: Awake/alert Behavior During Therapy: WFL for tasks assessed/performed Overall Cognitive Status: Within Functional Limits for tasks assessed                      Exercises Total Joint Exercises Ankle Circles/Pumps: AROM;Both;10  reps;Supine Quad Sets: AROM;Both;Supine;15 reps Heel Slides: AAROM;Left;Supine;10 reps Straight Leg Raises: AAROM;Left;Supine;10 reps Goniometric ROM: AAROM at L knee -10 - 40 - pain ltd    General Comments        Pertinent Vitals/Pain Pain Assessment: 0-10 Pain Score: 8  Pain Location: L knee Pain Descriptors / Indicators: Aching;Sore;Tightness Pain Intervention(s): Limited activity within patient's tolerance;Monitored during session;Premedicated before session;Ice applied;Repositioned    Home Living                      Prior Function            PT Goals (current goals can now be found in the care plan section) Acute Rehab PT Goals Patient Stated Goal: get back to work ASAP PT Goal Formulation: With patient Time For Goal Achievement: 07/28/14 Potential to Achieve Goals: Good Progress towards PT goals: Progressing toward goals    Frequency  7X/week    PT Plan Current plan remains appropriate    Co-evaluation             End of Session Equipment Utilized During Treatment: Gait belt;Left knee immobilizer Activity Tolerance: Patient limited by pain Patient left: in chair;with call bell/phone within reach;with nursing/sitter in room     Time: 3614-4315 PT Time Calculation (min): 31 min  Charges:  $Gait Training: 8-22 mins $Therapeutic Exercise: 8-22 mins  G Codes:      Zahid Carneiro 15-Aug-2014, 9:46 AM

## 2014-07-23 NOTE — Progress Notes (Signed)
Subjective: 2 Days Post-Op Procedure(s) (LRB): LEFT TOTAL KNEE ARTHROPLASTY (Left) Patient reports pain as moderate.  tolerating PO's. Passing flatus, but no BM. Reports a good night. Progressing with PT. Denies CP, SOB, or calf pain.  Objective: Vital signs in last 24 hours: Temp:  [97.3 F (36.3 C)-99.5 F (37.5 C)] 99.4 F (37.4 C) (10/17 0551) Pulse Rate:  [73-86] 86 (10/17 0551) Resp:  [14-17] 15 (10/17 0551) BP: (140-162)/(75-90) 140/80 mmHg (10/17 0551) SpO2:  [92 %-100 %] 92 % (10/17 0551)  Intake/Output from previous day: 10/16 0701 - 10/17 0700 In: 480 [P.O.:480] Out: 2950 [Urine:2950] Intake/Output this shift:     Recent Labs  07/22/14 0531 07/23/14 0444  HGB 12.6* 12.1*    Recent Labs  07/22/14 0531 07/23/14 0444  WBC 12.1* 12.8*  RBC 4.37 4.24  HCT 36.3* 36.2*  PLT 222 199    Recent Labs  07/22/14 0531  NA 139  K 4.5  CL 102  CO2 25  BUN 13  CREATININE 0.81  GLUCOSE 148*  CALCIUM 8.4   No results found for this basename: LABPT, INR,  in the last 72 hours  Alert and oriented x3. RRR, Lungs clear, BS x4. Left Calf soft and non tender. Mccoy knee dressing C/D/I. No DVT signs. No signs of infection or compartment syndrome. LLE neurovascularly intact.   Assessment/Plan: 2 Days Post-Op Procedure(s) (LRB): LEFT TOTAL KNEE ARTHROPLASTY (Left) Up with PT Ace D/c'ed Place TED WBAT Plan to D/c to [facility] in place  Drain site dressing changed   Charles Mccoy 07/23/2014, 9:05 AM

## 2014-07-24 LAB — CBC
HEMATOCRIT: 35.8 % — AB (ref 39.0–52.0)
Hemoglobin: 12 g/dL — ABNORMAL LOW (ref 13.0–17.0)
MCH: 28.5 pg (ref 26.0–34.0)
MCHC: 33.5 g/dL (ref 30.0–36.0)
MCV: 85 fL (ref 78.0–100.0)
Platelets: 189 10*3/uL (ref 150–400)
RBC: 4.21 MIL/uL — ABNORMAL LOW (ref 4.22–5.81)
RDW: 13.5 % (ref 11.5–15.5)
WBC: 9.9 10*3/uL (ref 4.0–10.5)

## 2014-07-24 NOTE — Progress Notes (Signed)
Subjective: 3 Days Post-Op Procedure(s) (LRB): LEFT TOTAL KNEE ARTHROPLASTY (Left) Patient reports pain as 2 on 0-10 scale. Doing very well. No complaints. Awaiting SNF tomorrow.HBg 12.0   Objective: Vital signs in last 24 hours: Temp:  [98.9 F (37.2 C)-99.4 F (37.4 C)] 99.1 F (37.3 C) (10/18 0546) Pulse Rate:  [79-84] 80 (10/18 0546) Resp:  [14-15] 15 (10/18 0546) BP: (122-139)/(65-81) 122/80 mmHg (10/18 0546) SpO2:  [95 %-99 %] 95 % (10/18 0546)  Intake/Output from previous day: 10/17 0701 - 10/18 0700 In: 720 [P.O.:720] Out: 3500 [Urine:3500] Intake/Output this shift: Total I/O In: -  Out: 400 [Urine:400]   Recent Labs  07/22/14 0531 07/23/14 0444 07/24/14 0518  HGB 12.6* 12.1* 12.0*    Recent Labs  07/23/14 0444 07/24/14 0518  WBC 12.8* 9.9  RBC 4.24 4.21*  HCT 36.2* 35.8*  PLT 199 189    Recent Labs  07/22/14 0531  NA 139  K 4.5  CL 102  CO2 25  BUN 13  CREATININE 0.81  GLUCOSE 148*  CALCIUM 8.4   No results found for this basename: LABPT, INR,  in the last 72 hours  Dorsiflexion/Plantar flexion intact Compartment soft  Assessment/Plan: 3 Days Post-Op Procedure(s) (LRB): LEFT TOTAL KNEE ARTHROPLASTY (Left) Up with therapy  Denzal Meir A 07/24/2014, 8:37 AM

## 2014-07-24 NOTE — Progress Notes (Signed)
Physical Therapy Treatment Patient Details Name: Charles Mccoy MRN: [id number] DOB: August 31, 1951 Today's Date: 07-30-14    History of Present Illness L TKR    PT Comments      Follow Up Recommendations  SNF     Equipment Recommendations  None recommended by PT    Recommendations for Other Services OT consult     Precautions / Restrictions Precautions Precautions: Knee;Fall Required Braces or Orthoses: Knee Immobilizer - Left Knee Immobilizer - Left: Discontinue once straight leg raise with < 10 degree lag Restrictions Weight Bearing Restrictions: No Other Position/Activity Restrictions: WBAT    Mobility  Bed Mobility Overal bed mobility: Needs Assistance Bed Mobility: Sit to Supine       Sit to supine: Min assist   General bed mobility comments: Min assist with L LE management  Transfers Overall transfer level: Needs assistance Equipment used: Rolling walker (2 wheeled) Transfers: Sit to/from Stand Sit to Stand: Min guard         General transfer comment: Cues for LE management and use of UEs.  Transfer to low chair  Ambulation/Gait Ambulation/Gait assistance: Min guard Ambulation Distance (Feet): 62 Feet (and 15) Assistive device: Rolling walker (2 wheeled) Gait Pattern/deviations: Step-to pattern     General Gait Details: min cues for position from [employer]            Wheelchair Mobility    Modified Rankin (Stroke Patients Only)       Balance                                    Cognition Arousal/Alertness: Awake/alert Behavior During Therapy: WFL for tasks assessed/performed Overall Cognitive Status: Within Functional Limits for tasks assessed                      Exercises      General Comments        Pertinent Vitals/Pain Pain Assessment: 0-10 Pain Score: 4  Pain Location: L knee Pain Descriptors / Indicators: Aching;Sore Pain Intervention(s): Limited activity within patient's  tolerance;Monitored during session;Premedicated before session;Ice applied    Home Living                      Prior Function            PT Goals (current goals can now be found in the care plan section) Acute Rehab PT Goals Patient Stated Goal: get back to work ASAP PT Goal Formulation: With patient Time For Goal Achievement: 07/28/14 Potential to Achieve Goals: Good Progress towards PT goals: Progressing toward goals    Frequency  7X/week    PT Plan Current plan remains appropriate    Co-evaluation             End of Session Equipment Utilized During Treatment: Left knee immobilizer Activity Tolerance: Other (comment) (ltd by nausea) Patient left: in bed;with call bell/phone within reach;with family/visitor present     Time: 9470-9628 PT Time Calculation (min): 22 min  Charges:  $Gait Training: 8-22 mins                    G Codes:      Teshara Moree 2014-07-30, 3:57 PM

## 2014-07-24 NOTE — Progress Notes (Signed)
Physical Therapy Treatment Patient Details Name: Charles Mccoy MRN: [id number] DOB: [birth year] Today's Date: 2014/08/08    History of Present Illness L TKR    PT Comments      Follow Up Recommendations  SNF     Equipment Recommendations  None recommended by PT    Recommendations for Other Services OT consult     Precautions / Restrictions Precautions Precautions: Knee;Fall Required Braces or Orthoses: Knee Immobilizer - Left Knee Immobilizer - Left: Discontinue once straight leg raise with < 10 degree lag Restrictions Weight Bearing Restrictions: No Other Position/Activity Restrictions: WBAT    Mobility  Bed Mobility                  Transfers Overall transfer level: Needs assistance Equipment used: Rolling walker (2 wheeled) Transfers: Sit to/from Stand Sit to Stand: Min guard         General transfer comment: Cues for LE management and use of UEs.  Transfer to low chair  Ambulation/Gait Ambulation/Gait assistance: Min guard Ambulation Distance (Feet): 65 Feet (twice) Assistive device: Rolling walker (2 wheeled) Gait Pattern/deviations: Step-to pattern;Trunk flexed     General Gait Details: cues for sequence, posture and position from RW   Stairs            Wheelchair Mobility    Modified Rankin (Stroke Patients Only)       Balance                                    Cognition Arousal/Alertness: Awake/alert Behavior During Therapy: WFL for tasks assessed/performed Overall Cognitive Status: Within Functional Limits for tasks assessed                      Exercises Total Joint Exercises Ankle Circles/Pumps: AROM;Both;Supine;20 reps Quad Sets: AROM;Both;Supine;20 reps Heel Slides: AAROM;Left;Supine;20 reps Straight Leg Raises: AAROM;Left;Supine;20 reps Goniometric ROM: AAROM at L knee -10 - 55    General Comments        Pertinent Vitals/Pain Pain Assessment: 0-10 Pain Score: 4  Pain Location: L  knee Pain Descriptors / Indicators: Aching;Sore Pain Intervention(s): Limited activity within patient's tolerance;Monitored during session;Premedicated before session;Ice applied    Home Living                      Prior Function            PT Goals (current goals can now be found in the care plan section) Acute Rehab PT Goals Patient Stated Goal: get back to work ASAP PT Goal Formulation: With patient Time For Goal Achievement: 07/28/14 Potential to Achieve Goals: Good Progress towards PT goals: Progressing toward goals    Frequency  7X/week    PT Plan Current plan remains appropriate    Mccoy-evaluation             End of Session Equipment Utilized During Treatment: Gait belt;Left knee immobilizer Activity Tolerance: Patient tolerated treatment well Patient left: in chair;with call bell/phone within reach;with family/visitor present     Time: 5643-3295 PT Time Calculation (min): 35 min  Charges:  $Gait Training: 8-22 mins $Therapeutic Exercise: 8-22 mins                    G Codes:      Charles Mccoy August 08, 2014, 12:46 PM

## 2014-07-25 MED ORDER — MAGNESIUM CITRATE PO SOLN: 0.5000 | Freq: Once | ORAL | Status: DC

## 2014-07-25 NOTE — Progress Notes (Signed)
Patient is set to discharge to [facility] today. BCBS authorization obtained. Patient & wife, [name] aware. Discharge packet given to wife to take with them to facility - wife to transport.    Clinical Social Work Department CLINICAL SOCIAL WORK PLACEMENT NOTE 07/25/2014  Patient:  CYPRIAN, GONGAWARE  Account Number:  [id number] Admit date:  07/21/2014  Clinical Social Worker:  Renold Genta  Date/time:  07/22/2014 12:34 PM  Clinical Social Work is seeking post-discharge placement for this patient at the following level of care:   [place]   (*CSW will update this form in Epic as items are completed)   07/22/2014  Patient/family provided with [place] Department of Clinical Social Work's list of facilities offering this level of care within the geographic area requested by the patient (or if unable, by the patient's family).  07/22/2014  Patient/family informed of their freedom to choose among providers that offer the needed level of care, that participate in Medicare, Medicaid or managed care program needed by the patient, have an available bed and are willing to accept the patient.  07/22/2014  Patient/family informed of [facility]' ownership interest in [facility], as well as of the fact that they are under no obligation to receive care at this facility.  PASARR submitted to [employer]DS on 07/22/2014 PASARR number received on 07/22/2014  FL2 transmitted to all facilities in geographic area requested by pt/family on  07/22/2014 FL2 transmitted to all facilities within larger geographic area on   Patient informed that his/her managed care company has contracts with or will negotiate with  certain facilities, including the following:   BCBS     Patient/family informed of bed offers received:  07/22/2014 Patient chooses bed at [facility] Physician recommends and patient chooses bed at    Patient to be transferred to [place] on  07/25/2014 Patient to be  transferred to facility by patient's wife, [name]  Patient and family notified of transfer on 07/25/2014 Name of family member notified:  patient's wife, [name] via phone  The following physician request were entered in Epic:   Additional Comments:   Raynaldo Opitz, [employer] Social Worker cell #: [phone]

## 2014-07-25 NOTE — Progress Notes (Signed)
Patient discharged with all belonings, and packet to give to [facility] place including prescriptions.   Patient stated understanding of follow up appointment, blood thinner medication, and pain management.   NT rolled patient down to family vehicle with wife to be transported to [facility].   Appropriate contact numbers for facility personnel given to patient.

## 2014-07-25 NOTE — Progress Notes (Signed)
Subjective: 4 Days Post-Op Procedure(s) (LRB): LEFT TOTAL KNEE ARTHROPLASTY (Left) Patient reports pain as mild.  Pain well controlled. Reports he has not yet had a BM, also has poor appetite. He has no other c/o. Hopeful for news on [place] today regarding a bed for him.  Objective: Vital signs in last 24 hours: Temp:  [98.4 F (36.9 C)-99 F (37.2 C)] 99 F (37.2 C) (10/19 0600) Pulse Rate:  [70-80] 70 (10/19 0600) Resp:  [15-16] 16 (10/19 0600) BP: (117-138)/(69-82) 117/69 mmHg (10/19 0600) SpO2:  [95 %-98 %] 97 % (10/19 0600)  Intake/Output from previous day: 10/18 0701 - 10/19 0700 In: 960 [P.O.:960] Out: 1900 [Urine:1900] Intake/Output this shift:     Recent Labs  07/23/14 0444 07/24/14 0518  HGB 12.1* 12.0*    Recent Labs  07/23/14 0444 07/24/14 0518  WBC 12.8* 9.9  RBC 4.24 4.21*  HCT 36.2* 35.8*  PLT 199 189   No results found for this basename: NA, K, CL, CO2, BUN, CREATININE, GLUCOSE, CALCIUM,  in the last 72 hours No results found for this basename: LABPT, INR,  in the last 72 hours  Neurologically intact ABD soft Neurovascular intact Sensation intact distally Intact pulses distally Dorsiflexion/Plantar flexion intact Incision: dressing C/D/I and scant drainage No cellulitis present Compartment soft no calf pain or sign of DVT Mild swelling  Assessment/Plan: 4 Days Post-Op Procedure(s) (LRB): LEFT TOTAL KNEE ARTHROPLASTY (Left) Advance diet Up with therapy D/C IV fluids Bowel protocol Plan D/C to SNF today- [facility], awaiting approval Pt has multiple steps at home (15) and has a severely arthritic R knee as well and would benefit from short term rehab stay to help increase his strength and motion prior to return home. Discussed D/C instructions, dressing instructions Follow up with Dr. Tonita Cong 10-14 days post-op for routine xrays and staple removal  Charles Mccoy M. 07/25/2014, 7:53 AM

## 2014-07-25 NOTE — Progress Notes (Signed)
Physical Therapy Treatment Patient Details Name: Charles Mccoy MRN: [id number] DOB: 07-09-1951 Today's Date: 07/25/2014    History of Present Illness L TKR    PT Comments    POD # 4 pt progressing slowly due to pain control and c/o ABD discomfort.   Pt reports poor sleep last night due to "cramping" in his ABD.  Was able to have a BM and feels 'some what better".  Assisted with amb pt in hallway then performed TKR TE's while supine in bed.  Followed by ICE to knee and heat to thigh.  Pt required increased time between TE's to control pain.  Follow Up Recommendations  SNF ([facility])     Equipment Recommendations  None recommended by PT    Recommendations for Other Services       Precautions / Restrictions Precautions Precautions: Knee;Fall Required Braces or Orthoses: Knee Immobilizer - Left Knee Immobilizer - Left: Discontinue once straight leg raise with < 10 degree lag Restrictions Weight Bearing Restrictions: No Other Position/Activity Restrictions: WBAT    Mobility  Bed Mobility Overal bed mobility: Needs Assistance         Sit to supine: Min guard   General bed mobility comments: instructed how to use other LE to assist l LE up onto bed  Transfers Overall transfer level: Needs assistance Equipment used: Rolling walker (2 wheeled) Transfers: Sit to/from Stand Sit to Stand: Min guard         General transfer comment: increased time due to limited knee flex  Ambulation/Gait Ambulation/Gait assistance: Min guard Ambulation Distance (Feet): 52 Feet Assistive device: Rolling walker (2 wheeled) Gait Pattern/deviations: Step-to pattern;Decreased stance time - left Gait velocity: decreased amb distance due to increased c/o pain in knee and "soreness in thigh       Stairs            Wheelchair Mobility    Modified Rankin (Stroke Patients Only)       Balance                                    Cognition                             Exercises      General Comments        Pertinent Vitals/Pain      Home Living                      Prior Function            PT Goals (current goals can now be found in the care plan section) Progress towards PT goals: Progressing toward goals    Frequency  7X/week    PT Plan Current plan remains appropriate    Co-evaluation             End of Session Equipment Utilized During Treatment: Left knee immobilizer Activity Tolerance: Patient tolerated treatment well Patient left: in bed;with call bell/phone within reach;with family/visitor present     Time: 1005-1045 PT Time Calculation (min): 40 min  Charges:  $Gait Training: 8-22 mins $Therapeutic Exercise: 8-22 mins $Therapeutic Activity: 8-22 mins                    G Codes:      Rica Koyanagi  PTA [facility]  Acute  Rehab Pager  [phone]  

## 2014-07-26 ENCOUNTER — Non-Acute Institutional Stay (SKILLED_NURSING_FACILITY): Payer: BC Managed Care – PPO | Admitting: Internal Medicine

## 2014-07-26 DIAGNOSIS — D62 Acute posthemorrhagic anemia: Secondary | ICD-10-CM

## 2014-07-26 DIAGNOSIS — M1712 Unilateral primary osteoarthritis, left knee: Secondary | ICD-10-CM

## 2014-07-26 DIAGNOSIS — I10 Essential (primary) hypertension: Secondary | ICD-10-CM

## 2014-07-26 DIAGNOSIS — J45909 Unspecified asthma, uncomplicated: Secondary | ICD-10-CM

## 2014-07-28 ENCOUNTER — Encounter: Payer: Self-pay | Admitting: Adult Health

## 2014-07-28 ENCOUNTER — Non-Acute Institutional Stay (SKILLED_NURSING_FACILITY): Payer: BC Managed Care – PPO | Admitting: Adult Health

## 2014-07-28 DIAGNOSIS — K219 Gastro-esophageal reflux disease without esophagitis: Secondary | ICD-10-CM | POA: Insufficient documentation

## 2014-07-28 DIAGNOSIS — M1712 Unilateral primary osteoarthritis, left knee: Secondary | ICD-10-CM

## 2014-07-28 DIAGNOSIS — J45909 Unspecified asthma, uncomplicated: Secondary | ICD-10-CM

## 2014-07-28 DIAGNOSIS — J309 Allergic rhinitis, unspecified: Secondary | ICD-10-CM

## 2014-07-28 DIAGNOSIS — I10 Essential (primary) hypertension: Secondary | ICD-10-CM

## 2014-07-28 NOTE — Progress Notes (Signed)
Patient ID: Charles Mccoy, male   DOB: 1950-11-13, 63 y.o.   MRN: [id number]              PROGRESS NOTE  DATE: 07/28/2014   FACILITY: [facility]  LEVEL OF CARE: SNF (31)  Discharge Notes  HISTORY OF PRESENT ILLNESS:   This is a 63 year old male who is for discharge home with outpatient rehabilitation. DME: Rolling walker. He has been admitted to [facility] on 07/25/14 from [facility] with DJD S/P left total knee arthroplasty. Patient was admitted to this facility for short-term rehabilitation after the patient's recent hospitalization.  Patient has completed SNF rehabilitation and therapy has cleared the patient for discharge.  Reassessment of ongoing problem(s):  HTN: Pt 's HTN remains stable.  Denies CP, sob, DOE, headaches, dizziness or visual disturbances.  No complications from the medications currently being used.  Last BP : 124/75  ALLERGIC RHINITIS: Allergic rhinitis remains stable.  Patient denies ongoing symptoms such as runny nose sneezing or tearing. No complications reported from the current medication(s) being used.  ASTHMA: The patient's asthma remains stable. Patient denies shortness of breath, dyspnea on exertion or wheezing. No complications reported from the medications currently being used.   Past Medical History  Diagnosis Date  . ALLERGIC RHINITIS 06/19/2007  . ASTHMA 08/17/2008  . HYPERTENSION 06/19/2007  . OSTEOARTHRITIS 10/09/2009  . SINUSITIS 09/15/2007  . COPD (chronic obstructive pulmonary disease)   . Pneumonia     hx of   . GERD (gastroesophageal reflux disease)      Reviewed.  No changes/see problem list  CURRENT MEDICATIONS: Reviewed per MAR/see medication list    Allergies  Allergen Reactions  . Erythromycin Base     REACTION: stomach irritation     REVIEW OF SYSTEMS:  GENERAL: no change in appetite, no fatigue, no weight changes, no fever, chills or weakness RESPIRATORY: no cough, SOB, DOE, wheezing,  hemoptysis CARDIAC: no chest pain, or palpitations, +edema GI: no abdominal pain, diarrhea, constipation, heart burn, nausea or vomiting  PHYSICAL EXAMINATION  GENERAL: no acute distress EYES: conjunctivae normal, sclerae normal, normal eye lids NECK: supple, trachea midline, no neck masses, no thyroid tenderness, no thyromegaly LYMPHATICS: no LAN in the neck, no supraclavicular LAN RESPIRATORY: breathing is even & unlabored, BS CTAB CARDIAC: RRR, no murmur,no extra heart sounds, LLE edema 2+ GI: abdomen soft, normal BS, no masses, no tenderness, no hepatomegaly, no splenomegaly EXTREMITIES: able to move all 4 extremities; ambulates with walker PSYCHIATRIC: the patient is alert & oriented to person, affect & behavior appropriate  LABS/RADIOLOGY: Labs reviewed: Basic Metabolic Panel:  Recent Labs  08/30/13 0946 07/12/14 1200 07/22/14 0531  NA 136 138 139  K 4.1 4.5 4.5  CL 100 101 102  CO2 28 27 25   GLUCOSE 80 78 148*  BUN 18 18 13   CREATININE 0.9 1.02 0.81  CALCIUM 8.8 9.3 8.4   Liver Function Tests:  Recent Labs  08/30/13 0946  AST 20  ALT 21  ALKPHOS 46  BILITOT 1.1  PROT 6.8  ALBUMIN 3.9   CBC:  Recent Labs  08/30/13 0946  07/22/14 0531 07/23/14 0444 07/24/14 0518  WBC 5.0  < > 12.1* 12.8* 9.9  NEUTROABS 2.7  --   --   --   --   HGB 14.9  < > 12.6* 12.1* 12.0*  HCT 43.7  < > 36.3* 36.2* 35.8*  MCV 85.9  < > 83.1 85.4 85.0  PLT 245.0  < >  222 199 189  < > = values in this interval not displayed.  Lipid Panel:  Recent Labs  08/30/13 0946  HDL 37.80*   Dg Chest 2 View  07/12/2014   CLINICAL DATA:  Preop for left knee replacement, history of asthma and COPD  EXAM: CHEST  2 VIEW  COMPARISON:  None.  FINDINGS: No active infiltrate or effusion is seen. Mediastinal and hilar contours are unremarkable. The heart is within normal limits in size. There are degenerative changes throughout the mid to lower thoracic spine.  IMPRESSION: No active  cardiopulmonary disease.   Electronically Signed   By: Ivar Drape M.D.   On: 07/12/2014 15:07   Dg Knee 1-2 Views Left  07/21/2014   CLINICAL DATA:  Osteoarthritis of the left knee. Status post left total knee replacement.  EXAM: LEFT KNEE - 1-2 VIEW  COMPARISON:  07/12/2014  FINDINGS: The components of total knee prosthesis appear in good position. No fractures. Soft tissue drain in place.  IMPRESSION: Satisfactory appearance of the left knee after total knee replacement.   Electronically Signed   By: Rozetta Nunnery M.D.   On: 07/21/2014 10:47   Dg Knee 1-2 Views Left  07/12/2014   CLINICAL DATA:  Preop for left total knee replacement  EXAM: LEFT KNEE - 1-2 VIEW  COMPARISON:  None.  FINDINGS: There is primarily bicompartmental degenerative joint disease of the left knee involving the medial compartment and patellofemoral compartment. There is loss of joint space sclerosis and spurring involving these compartments. No fracture is seen. No joint effusion is noted.  IMPRESSION: Bicompartmental degenerative joint disease.   Electronically Signed   By: Ivar Drape M.D.   On: 07/12/2014 15:07   ASSESSMENT/PLAN:  DJD S/P Left total knee arthroplasty - for outpatient rehabilitation Hypertension - well-controlled; continue Norvasc and Aldactone Allergic Rhinitis - continue Singulair and Xyzal PRN Asthma - stable; continue Symbicort GERD - stable; continue Prilosec  I have filled out patient's discharge paperwork and written prescriptions.  Patient will have outpatient rehabilitation.  DME provided:  Rolling walker  Total discharge time: Greater than 30 minutes  Discharge time involved coordination of the discharge process with Education officer, museum, nursing staff and therapy department. Medical justification for DME verified.  CPT CODE: 50539  MEDINA-VARGAS,MONINA, [facility] [phone]

## 2014-07-29 NOTE — Progress Notes (Addendum)
HISTORY & PHYSICAL  DATE: 07/26/2014   FACILITY: [facility]  LEVEL OF CARE: SNF (31)  ALLERGIES:  Allergies  Allergen Reactions  . Erythromycin Base     REACTION: stomach irritation    CHIEF COMPLAINT:  Manage left knee OA, asthma & HTN  HISTORY OF PRESENT ILLNESS: pt is a 63 y/o Caucasian male:  KNEE OSTEOARTHRITIS: Patient had a history of pain and functional disability in the knee due to end-stage osteoarthritis and has failed nonsurgical conservative treatments. Patient had worsening of pain with activity and weight bearing, pain that interfered with activities of daily living & pain with passive range of motion. Therefore patient underwent total knee arthroplasty and tolerated the procedure well. Patient is admitted to this facility for sort short-term rehabilitation. Patient denies knee pain. C/o LLE swelling.  ASTHMA: The patient's asthma remains stable. Patient denies shortness of breath, dyspnea on exertion or wheezing. No complications reported from the medications currently being used.  HTN: Pt 's HTN remains stable.  Denies CP, sob, DOE, pedal edema, headaches, dizziness or visual disturbances.  No complications from the medications currently being used.  Last BP :113/65.  PAST MEDICAL HISTORY :  Past Medical History  Diagnosis Date  . ALLERGIC RHINITIS 06/19/2007  . ASTHMA 08/17/2008  . HYPERTENSION 06/19/2007  . OSTEOARTHRITIS 10/09/2009  . SINUSITIS 09/15/2007  . COPD (chronic obstructive pulmonary disease)   . Pneumonia     hx of   . GERD (gastroesophageal reflux disease)     PAST SURGICAL HISTORY: Past Surgical History  Procedure Laterality Date  . Knee arthroscopy      2 on left knee , 1 on right  . Cyst removed from neck       age 25   . Total knee arthroplasty Left 07/21/2014    Procedure: LEFT TOTAL KNEE ARTHROPLASTY;  Surgeon: Johnn Hai, MD;  Location: [facility];  Service: Orthopedics;  Laterality: Left;    SOCIAL  HISTORY:  reports that he quit smoking about 12 years ago. His smoking use included Cigarettes. He smoked 0.00 packs per day. He has never used smokeless tobacco. He reports that he drinks about 7.2 ounces of alcohol per week. He reports that he does not use illicit drugs.  FAMILY HISTORY: None  CURRENT MEDICATIONS: Reviewed per MAR/see medication list  REVIEW OF SYSTEMS:  See HPI otherwise 14 point ROS is negative.  PHYSICAL EXAMINATION  VS:  See VS section  GENERAL: no acute distress, normal body habitus EYES: conjunctivae normal, sclerae normal, normal eye lids MOUTH/THROAT: lips without lesions,no lesions in the mouth,tongue is without lesions,uvula elevates in midline NECK: supple, trachea midline, no neck masses, no thyroid tenderness, no thyromegaly LYMPHATICS: no LAN in the neck, no supraclavicular LAN RESPIRATORY: breathing is even & unlabored, BS CTAB CARDIAC: RRR, no murmur,no extra heart sounds, LLE +2 edema GI:  ABDOMEN: abdomen soft, normal BS, no masses, no tenderness  LIVER/SPLEEN: no hepatomegaly, no splenomegaly MUSCULOSKELETAL: HEAD: normal to inspection  EXTREMITIES: LEFT UPPER EXTREMITY: full range of motion, normal strength & tone RIGHT UPPER EXTREMITY:  full range of motion, normal strength & tone LEFT LOWER EXTREMITY: moderate range of motion, normal strength & tone RIGHT LOWER EXTREMITY:  full range of motion, normal strength & tone PSYCHIATRIC: the patient is alert & oriented to person, affect & behavior appropriate  LABS/RADIOLOGY:  Labs reviewed: Basic Metabolic Panel:  Recent Labs  08/30/13 0946 07/12/14 1200 07/22/14 0531  NA 136  138 139  K 4.1 4.5 4.5  CL 100 101 102  CO2 28 27 25   GLUCOSE 80 78 148*  BUN 18 18 13   CREATININE 0.9 1.02 0.81  CALCIUM 8.8 9.3 8.4   Liver Function Tests:  Recent Labs  08/30/13 0946  AST 20  ALT 21  ALKPHOS 46  BILITOT 1.1  PROT 6.8  ALBUMIN 3.9   CBC:  Recent Labs  08/30/13 0946   07/22/14 0531 07/23/14 0444 07/24/14 0518  WBC 5.0  < > 12.1* 12.8* 9.9  NEUTROABS 2.7  --   --   --   --   HGB 14.9  < > 12.6* 12.1* 12.0*  HCT 43.7  < > 36.3* 36.2* 35.8*  MCV 85.9  < > 83.1 85.4 85.0  PLT 245.0  < > 222 199 189  < > = values in this interval not displayed.  Lipid Panel:  Recent Labs  08/30/13 0946  HDL 37.80*    CHEST  2 VIEW   COMPARISON:  None.   FINDINGS: No active infiltrate or effusion is seen. Mediastinal and hilar contours are unremarkable. The heart is within normal limits in size. There are degenerative changes throughout the mid to lower thoracic spine.   IMPRESSION: No active cardiopulmonary disease. LEFT KNEE - 1-2 VIEW   COMPARISON:  None.   FINDINGS: There is primarily bicompartmental degenerative joint disease of the left knee involving the medial compartment and patellofemoral compartment. There is loss of joint space sclerosis and spurring involving these compartments. No fracture is seen. No joint effusion is noted.   IMPRESSION: Bicompartmental degenerative joint disease LEFT KNEE - 1-2 VIEW   COMPARISON:  07/12/2014   FINDINGS: The components of total knee prosthesis appear in good position. No fractures. Soft tissue drain in place.   IMPRESSION: Satisfactory appearance of the left knee after total knee replacement.    ASSESSMENT/PLAN:  Left knee OA-s/p total knee arthroplasty Asthma-well compensated HTN-well controlled Acute blood loss anemia-check Hb Allergic rhinitis-well controlled. GERD-cont. prilosec Check cbc w diff  I have reviewed patient's medical records received at admission/from hospitalization.  CPT CODE: 12248  Mal[address] Brynnlie Unterreiner, [place] [phone]

## 2014-08-25 ENCOUNTER — Ambulatory Visit (AMBULATORY_SURGERY_CENTER): Payer: Self-pay | Admitting: *Deleted

## 2014-08-25 VITALS — Ht 75.5 in | Wt 236.2 lb

## 2014-08-25 DIAGNOSIS — Z1211 Encounter for screening for malignant neoplasm of colon: Secondary | ICD-10-CM

## 2014-08-25 MED ORDER — MOVIPREP 100 G PO SOLR: 1.0000 | Freq: Once | ORAL | Status: DC

## 2014-08-25 NOTE — Progress Notes (Signed)
Denies allergies to eggs or soy products. Denies complications with sedation or anesthesia. Denies O2 use. Denies use of diet or weight loss medications.  Emmi instructions given for colonoscopy.  

## 2014-09-08 ENCOUNTER — Ambulatory Visit (AMBULATORY_SURGERY_CENTER): Payer: BC Managed Care – PPO | Admitting: Internal Medicine

## 2014-09-08 ENCOUNTER — Encounter: Payer: Self-pay | Admitting: Internal Medicine

## 2014-09-08 VITALS — BP 136/102 | HR 69 | Temp 97.0°F | Resp 14 | Ht 75.0 in | Wt 236.0 lb

## 2014-09-08 DIAGNOSIS — Z1211 Encounter for screening for malignant neoplasm of colon: Secondary | ICD-10-CM

## 2014-09-08 MED ORDER — SODIUM CHLORIDE 0.9 % IV SOLN: 500.0000 mL | INTRAVENOUS | Status: DC

## 2014-09-08 NOTE — Progress Notes (Signed)
Report to PACU, RN, vss, BBS= Clear.  

## 2014-09-08 NOTE — Patient Instructions (Addendum)
YOU HAD AN ENDOSCOPIC PROCEDURE TODAY AT THE [facility]: Refer to the procedure report that was given to you for any specific questions about what was found during the examination.  If the procedure report does not answer your questions, please call your gastroenterologist to clarify.  If you requested that your care partner not be given the details of your procedure findings, then the procedure report has been included in a sealed envelope for you to review at your convenience later.  YOU SHOULD EXPECT: Some feelings of bloating in the abdomen. Passage of more gas than usual.  Walking can help get rid of the air that was put into your GI tract during the procedure and reduce the bloating. If you had a lower endoscopy (such as a colonoscopy or flexible sigmoidoscopy) you may notice spotting of blood in your stool or on the toilet paper. If you underwent a bowel prep for your procedure, then you may not have a normal bowel movement for a few days.  DIET: Your first meal following the procedure should be a light meal and then it is ok to progress to your normal diet.  A half-sandwich or bowl of soup is an example of a good first meal.  Heavy or fried foods are harder to digest and may make you feel nauseous or bloated.  Likewise meals heavy in dairy and vegetables can cause extra gas to form and this can also increase the bloating.  Drink plenty of fluids but you should avoid alcoholic beverages for 24 hours.Try to increase the fiber in your diet.  ACTIVITY: Your care partner should take you home directly after the procedure.  You should plan to take it easy, moving slowly for the rest of the day.  You can resume normal activity the day after the procedure however you should NOT DRIVE or use heavy machinery for 24 hours (because of the sedation medicines used during the test).    SYMPTOMS TO REPORT IMMEDIATELY: A gastroenterologist can be reached at any hour.  During normal business hours, 8:30  AM to 5:00 PM Monday through Friday, call [phone].  After hours and on weekends, please call the GI answering service at [phone] who will take a message and have the physician on call contact you.   Following lower endoscopy (colonoscopy or flexible sigmoidoscopy):  Excessive amounts of blood in the stool  Significant tenderness or worsening of abdominal pains  Swelling of the abdomen that is new, acute  Fever of 100F or higher  FOLLOW UP: If any biopsies were taken you will be contacted by phone or by letter within the next 1-3 weeks.  Call your gastroenterologist if you have not heard about the biopsies in 3 weeks.  Our staff will call the home number listed on your records the next business day following your procedure to check on you and address any questions or concerns that you may have at that time regarding the information given to you following your procedure. This is a courtesy call and so if there is no answer at the home number and we have not heard from you through the emergency physician on call, we will assume that you have returned to your regular daily activities without incident.  SIGNATURES/CONFIDENTIALITY: You and/or your care partner have signed paperwork which will be entered into your electronic medical record.  These signatures attest to the fact that that the information above on your After Visit Summary has been reviewed and is understood.    Full responsibility of the confidentiality of this discharge information lies with you and/or your care-partner.  Please, read the handouts given to you by your recovery room nurse. 

## 2014-09-08 NOTE — Op Note (Signed)
[facility]  [address] [place], [id number]   COLONOSCOPY PROCEDURE REPORT  PATIENT: Casimiro, Lienhard  MR#: [id number] BIRTHDATE: October 14, 1950 , 63  yrs. old GENDER: male ENDOSCOPIST: Eustace Quail, MD REFERRED [id number] Recall, PROCEDURE DATE:  09/08/2014 PROCEDURE:   Colonoscopy, screening First Screening Colonoscopy - Avg.  risk and is 50 yrs.  old or older - No.  Prior Negative Screening - Now for repeat screening. Other: See Comments  History of Adenoma - Now for follow-up colonoscopy & has been > or = to 3 yrs.  N/A  Polyps Removed Today? No.  Recommend repeat exam, <10 yrs? No. ASA CLASS:   Class II INDICATIONS:average risk for colorectal cancer. . Index exam 2007 negative (marked diverticulosis with deep folds) MEDICATIONS: Monitored anesthesia care and Propofol 300 mg IV  DESCRIPTION OF PROCEDURE:   After the risks benefits and alternatives of the procedure were thoroughly explained, informed consent was obtained.  The digital rectal exam revealed no abnormalities of the rectum.   The [id number]  endoscope was introduced through the anus and advanced to the cecum, which was identified by both the appendix and ileocecal valve. No adverse events experienced.   The quality of the prep was excellent, using MoviPrep  The instrument was then slowly withdrawn as the colon was fully examined.   COLON FINDINGS: There was moderate diverticulosis noted in the left colon.   The examination was otherwise normal. No polyps, inflammation, or other abnormalities.  Retroflexed views revealed internal hemorrhoids. The time to cecum=2 minutes 23 seconds. Withdrawal time=9 minutes 14 seconds.  The scope was withdrawn and the procedure completed. COMPLICATIONS: There were no immediate complications.  ENDOSCOPIC IMPRESSION: 1.   Moderate diverticulosis was noted in the left colon 2.   The examination was otherwise normal  RECOMMENDATIONS: 1. Continue  current colorectal screening recommendations for "routine risk" patients with a repeat colonoscopy in 10 years.  eSigned:  Eustace Quail, MD 09/08/2014 4:16 PM   cc: Marletta Lor, MD and The Patient

## 2014-09-09 ENCOUNTER — Telehealth: Payer: Self-pay | Admitting: *Deleted

## 2014-09-09 NOTE — Telephone Encounter (Signed)
  Follow up Call-  Call back number 09/08/2014  Post procedure Call Back phone  # [phone] hm  Permission to leave phone message Yes     Patient questions:  Do you have a fever, pain , or abdominal swelling? No. Pain Score  0 *  Have you tolerated food without any problems? Yes.    Have you been able to return to your normal activities? Yes.    Do you have any questions about your discharge instructions: Diet   No. Medications  No. Follow up visit  No.  Do you have questions or concerns about your Care? No.  Actions: * If pain score is 4 or above: No action needed, pain <4.

## 2014-09-21 ENCOUNTER — Other Ambulatory Visit: Payer: Self-pay | Admitting: Internal Medicine

## 2014-12-02 ENCOUNTER — Other Ambulatory Visit: Payer: Self-pay

## 2014-12-02 MED ORDER — OMEPRAZOLE 20 MG PO CPDR: 20.0000 mg | DELAYED_RELEASE_CAPSULE | ORAL | Status: DC

## 2014-12-02 NOTE — Telephone Encounter (Signed)
Rx request for omeprazole DR 20 mg capsule-Take 1 capsule by mouth every day #30  Pharm:  [facility]  Rx sent to pharmacy

## 2014-12-14 ENCOUNTER — Encounter: Payer: Self-pay | Admitting: Internal Medicine

## 2014-12-14 ENCOUNTER — Ambulatory Visit (INDEPENDENT_AMBULATORY_CARE_PROVIDER_SITE_OTHER): Payer: Managed Care, Other (non HMO) | Admitting: Internal Medicine

## 2014-12-14 VITALS — BP 150/90 | HR 62 | Temp 98.3°F | Resp 20 | Ht 75.0 in | Wt 243.0 lb

## 2014-12-14 DIAGNOSIS — L309 Dermatitis, unspecified: Secondary | ICD-10-CM

## 2014-12-14 MED ORDER — TRIAMCINOLONE ACETONIDE 0.5 % EX OINT: 1.0000 "application " | TOPICAL_OINTMENT | Freq: Two times a day (BID) | CUTANEOUS | Status: DC

## 2014-12-14 NOTE — Patient Instructions (Signed)
Call or return to clinic prn if these symptoms worsen or fail to improve as anticipated.

## 2014-12-14 NOTE — Progress Notes (Signed)
Pre visit review using our clinic review tool, if applicable. No additional management support is needed unless otherwise documented below in the visit note. 

## 2015-01-23 ENCOUNTER — Telehealth: Payer: Self-pay | Admitting: Internal Medicine

## 2015-01-23 MED ORDER — AMLODIPINE BESYLATE 10 MG PO TABS: 10.0000 mg | ORAL_TABLET | ORAL | Status: DC

## 2015-01-23 NOTE — Telephone Encounter (Signed)
Pt request refill amLODipine (NORVASC) 10 MG tablet  [facility]   Pt going out of town later today, thanks

## 2015-01-23 NOTE — Telephone Encounter (Signed)
Rx sent to pharmacy   

## 2015-03-08 ENCOUNTER — Other Ambulatory Visit: Payer: Self-pay | Admitting: Internal Medicine

## 2015-03-09 ENCOUNTER — Other Ambulatory Visit: Payer: Self-pay | Admitting: Internal Medicine

## 2015-03-13 ENCOUNTER — Other Ambulatory Visit: Payer: Self-pay | Admitting: Internal Medicine

## 2015-04-24 ENCOUNTER — Other Ambulatory Visit: Payer: Self-pay | Admitting: Internal Medicine

## 2015-05-01 ENCOUNTER — Encounter: Payer: Self-pay | Admitting: Internal Medicine

## 2015-05-01 ENCOUNTER — Ambulatory Visit (INDEPENDENT_AMBULATORY_CARE_PROVIDER_SITE_OTHER): Payer: Managed Care, Other (non HMO) | Admitting: Internal Medicine

## 2015-05-01 VITALS — BP 150/80 | HR 66 | Temp 98.4°F | Resp 20 | Ht 75.0 in | Wt 251.0 lb

## 2015-05-01 DIAGNOSIS — M159 Polyosteoarthritis, unspecified: Secondary | ICD-10-CM

## 2015-05-01 DIAGNOSIS — M150 Primary generalized (osteo)arthritis: Secondary | ICD-10-CM

## 2015-05-01 DIAGNOSIS — L309 Dermatitis, unspecified: Secondary | ICD-10-CM

## 2015-05-01 DIAGNOSIS — I10 Essential (primary) hypertension: Secondary | ICD-10-CM | POA: Diagnosis not present

## 2015-05-01 MED ORDER — TRIAMCINOLONE ACETONIDE 0.1 % EX CREA: 1.0000 "application " | TOPICAL_CREAM | Freq: Two times a day (BID) | CUTANEOUS | Status: DC

## 2015-05-01 NOTE — Patient Instructions (Signed)
Call or return to clinic prn if these symptoms worsen or fail to improve as anticipated.

## 2015-05-01 NOTE — Progress Notes (Signed)
Pre visit review using our clinic review tool, if applicable. No additional management support is needed unless otherwise documented below in the visit note. 

## 2015-05-01 NOTE — Progress Notes (Signed)
Subjective:    Patient ID: Charles Mccoy, male    DOB: 12-13-1950, 64 y.o.   MRN: [id number]  HPI 64 year old patient who presents with a two-week history of a slightly pruritic rash involving the anterior distal lower extremities.  He travels frequently and return from out of state earlier today.  He has been using triamcinolone with improvement.  Since left total knee replacement surgery in October of last year.  He has had some chronic left ankle swelling.  The erythematous macular rash was more prominent over the anterior left lower leg and ankle and was slightly tender earlier.  He feels the rash has steadily improved with topical triamcinolone.  No fever or other constitutional complaints.  Rash is slightly pruritic    Past Medical History  Diagnosis Date  . ALLERGIC RHINITIS 06/19/2007  . ASTHMA 08/17/2008  . HYPERTENSION 06/19/2007  . OSTEOARTHRITIS 10/09/2009  . SINUSITIS 09/15/2007  . COPD (chronic obstructive pulmonary disease)   . Pneumonia     hx of   . GERD (gastroesophageal reflux disease)     History   Social History  . Marital Status: Married    Spouse Name: N/A  . Number of Children: N/A  . Years of Education: N/A   Occupational History  . Not on file.   Social History Main Topics  . Smoking status: Former Smoker    Types: Cigarettes    Quit date: 10/07/2001  . Smokeless tobacco: Never Used  . Alcohol Use: 3.6 oz/week    6 Cans of beer per week     Comment: beer daily  . Drug Use: No  . Sexual Activity: Not on file   Other Topics Concern  . Not on file   Social History Narrative    Past Surgical History  Procedure Laterality Date  . Knee arthroscopy      2 on left knee , 1 on right  . Cyst removed from neck       age 61   . Total knee arthroplasty Left 07/21/2014    Procedure: LEFT TOTAL KNEE ARTHROPLASTY;  Surgeon: Johnn Hai, MD;  Location: [facility];  Service: Orthopedics;  Laterality: Left;    Family History  Problem Relation Age of Onset    . Colon cancer Neg Hx   . Esophageal cancer Neg Hx   . Rectal cancer Neg Hx   . Stomach cancer Neg Hx     Allergies  Allergen Reactions  . Erythromycin Base     REACTION: stomach irritation    Current Outpatient Prescriptions on File Prior to Visit  Medication Sig Dispense Refill  . acetaminophen (TYLENOL ARTHRITIS PAIN) 650 MG CR tablet Take 1,300 mg by mouth once as needed for pain.    Marland Kitchen albuterol (PROVENTIL HFA;VENTOLIN HFA) 108 (90 BASE) MCG/ACT inhaler Inhale 2 puffs into the lungs every 6 (six) hours as needed for wheezing or shortness of breath.    Marland Kitchen amLODipine (NORVASC) 10 MG tablet Take 1 tablet (10 mg total) by mouth every morning. 90 tablet 1  . celecoxib (CELEBREX) 200 MG capsule Take 200 mg by mouth daily.   0  . cephALEXin (KEFLEX) 500 MG capsule Prior to Dental Appointment  6  . Fluticasone-Salmeterol (ADVAIR) 250-50 MCG/DOSE AEPB Inhale 1 puff into the lungs 2 (two) times daily.    Marland Kitchen ketoconazole (NIZORAL) 2 % shampoo USE DAILY AS DIRECTED 120 mL 2  . levocetirizine (XYZAL) 5 MG tablet Take 5 mg by mouth as needed for allergies.     Marland Kitchen  montelukast (SINGULAIR) 10 MG tablet TAKE 1 TABLET BY MOUTH AT BEDTIME 90 tablet 1  . omeprazole (PRILOSEC) 20 MG capsule TAKE 1 CAPSULE (20 MG TOTAL) BY MOUTH EVERY MORNING. 90 capsule 3  . PRESCRIPTION MEDICATION Place 1 spray into the nose daily as needed (allergies.). Qnasal nasal spray    . spironolactone (ALDACTONE) 25 MG tablet TAKE 1 TABLET BY MOUTH EVERY DAY 90 tablet 0  . vardenafil (LEVITRA) 20 MG tablet Take 20 mg by mouth daily as needed for erectile dysfunction.     No current facility-administered medications on file prior to visit.    BP 150/80 mmHg  Pulse 66  Temp(Src) 98.4 F (36.9 C) (Oral)  Resp 20  Ht 6\' 3"  (1.905 m)  Wt 251 lb (113.853 kg)  BMI 31.37 kg/m2  SpO2 98%      Review of Systems  Skin: Positive for rash.       Objective:   Physical Exam  Skin:  Patchy erythematous macular rash over  the anterior distal lower legs and the left ankle region Soft tissue swelling about the left ankle which has been chronic          Assessment & Plan:   Lower extremity dermatitis, unclear etiology, may be a photodermatitis type reaction.  He does use sunscreen on his arms and face, but not his lower extremities.  The rash was more prominent involving the left anterior lower leg and was slightly tender earlier, but seems to be improving.  This suggests perhaps some improving cellulitis.  Will continue triamcinolone elevation.  He will report any clinical worsening. He has signed up for my chart, which he will utilize since he'll be leaving town again in 2 days

## 2015-05-02 ENCOUNTER — Telehealth: Payer: Self-pay | Admitting: Internal Medicine

## 2015-05-02 MED ORDER — TRIAMCINOLONE ACETONIDE 0.5 % EX CREA: 1.0000 "application " | TOPICAL_CREAM | Freq: Two times a day (BID) | CUTANEOUS | Status: DC

## 2015-05-02 NOTE — Telephone Encounter (Signed)
Spoke to pt, told him new Rx was sent to pharmacy for Triamcinolone cream 0.5%. Pt verbalized understanding.

## 2015-05-02 NOTE — Telephone Encounter (Signed)
Pt request refill of the following: triamcinolone cream (KENALOG) 0.1 %  Pt said he did not pick up the above med because the cream was suppose to be .5 % he said him and Dr Raliegh Ip discuss the cream being higher. Would like the higher dose called in      Phamacy:

## 2015-05-17 ENCOUNTER — Other Ambulatory Visit: Payer: Self-pay | Admitting: Internal Medicine

## 2015-07-19 ENCOUNTER — Other Ambulatory Visit: Payer: Self-pay | Admitting: Internal Medicine

## 2015-07-24 ENCOUNTER — Telehealth: Payer: Self-pay | Admitting: Internal Medicine

## 2015-07-24 NOTE — Telephone Encounter (Signed)
Pt needs cpx before end of year. Can I create 30 min slot?

## 2015-07-24 NOTE — Telephone Encounter (Signed)
Yes, that is fine. 

## 2015-07-25 NOTE — Telephone Encounter (Signed)
Pt has been sch

## 2015-09-05 ENCOUNTER — Ambulatory Visit (INDEPENDENT_AMBULATORY_CARE_PROVIDER_SITE_OTHER): Payer: Managed Care, Other (non HMO) | Admitting: Internal Medicine

## 2015-09-05 ENCOUNTER — Encounter: Payer: Self-pay | Admitting: Internal Medicine

## 2015-09-05 ENCOUNTER — Other Ambulatory Visit (INDEPENDENT_AMBULATORY_CARE_PROVIDER_SITE_OTHER): Payer: Managed Care, Other (non HMO)

## 2015-09-05 VITALS — BP 138/90 | HR 63 | Temp 98.5°F | Ht 75.5 in | Wt 248.0 lb

## 2015-09-05 DIAGNOSIS — I10 Essential (primary) hypertension: Secondary | ICD-10-CM | POA: Diagnosis not present

## 2015-09-05 DIAGNOSIS — J209 Acute bronchitis, unspecified: Secondary | ICD-10-CM

## 2015-09-05 DIAGNOSIS — J4520 Mild intermittent asthma, uncomplicated: Secondary | ICD-10-CM

## 2015-09-05 DIAGNOSIS — Z0000 Encounter for general adult medical examination without abnormal findings: Secondary | ICD-10-CM | POA: Diagnosis not present

## 2015-09-05 LAB — CBC WITH DIFFERENTIAL/PLATELET
Basophils Absolute: 0 K/uL (ref 0.0–0.1)
Basophils Relative: 0.1 % (ref 0.0–3.0)
Eosinophils Absolute: 0.4 K/uL (ref 0.0–0.7)
Eosinophils Relative: 5.1 % — ABNORMAL HIGH (ref 0.0–5.0)
HCT: 44.5 % (ref 39.0–52.0)
Hemoglobin: 15.2 g/dL (ref 13.0–17.0)
Lymphocytes Relative: 15.5 % (ref 12.0–46.0)
Lymphs Abs: 1.3 K/uL (ref 0.7–4.0)
MCHC: 34.2 g/dL (ref 30.0–36.0)
MCV: 84.3 fl (ref 78.0–100.0)
Monocytes Absolute: 0.8 K/uL (ref 0.1–1.0)
Monocytes Relative: 9.6 % (ref 3.0–12.0)
Neutro Abs: 6 K/uL (ref 1.4–7.7)
Neutrophils Relative %: 69.7 % (ref 43.0–77.0)
Platelets: 257 K/uL (ref 150.0–400.0)
RBC: 5.29 Mil/uL (ref 4.22–5.81)
RDW: 13.6 % (ref 11.5–15.5)
WBC: 8.7 K/uL (ref 4.0–10.5)

## 2015-09-05 LAB — LIPID PANEL
CHOL/HDL RATIO: 5
Cholesterol: 176 mg/dL (ref 0–200)
HDL: 33.4 mg/dL — AB (ref >39.00)
LDL CALC: 126 mg/dL — AB (ref 0–99)
NONHDL: 142.73
Triglycerides: 84 mg/dL (ref 0.0–149.0)
VLDL: 16.8 mg/dL (ref 0.0–40.0)

## 2015-09-05 LAB — BASIC METABOLIC PANEL
BUN: 13 mg/dL (ref 6–23)
CALCIUM: 9.1 mg/dL (ref 8.4–10.5)
CO2: 29 mEq/L (ref 19–32)
Chloride: 102 mEq/L (ref 96–112)
Creatinine, Ser: 0.91 mg/dL (ref 0.40–1.50)
GFR: 89.59 mL/min (ref >60.00)
Glucose, Bld: 99 mg/dL (ref 70–99)
Potassium: 4.4 mEq/L (ref 3.5–5.1)
SODIUM: 138 meq/L (ref 135–145)

## 2015-09-05 LAB — POCT URINALYSIS DIPSTICK
Bilirubin, UA: NEGATIVE
Blood, UA: NEGATIVE
Glucose, UA: NEGATIVE
Ketones, UA: NEGATIVE
Leukocytes, UA: NEGATIVE
Nitrite, UA: NEGATIVE
Protein, UA: NEGATIVE
Spec Grav, UA: 1.01
Urobilinogen, UA: 1
pH, UA: 7

## 2015-09-05 LAB — HEPATIC FUNCTION PANEL
ALK PHOS: 63 U/L (ref 39–117)
ALT: 15 U/L (ref 0–53)
AST: 13 U/L (ref 0–37)
Albumin: 3.9 g/dL (ref 3.5–5.2)
BILIRUBIN DIRECT: 0.1 mg/dL (ref 0.0–0.3)
BILIRUBIN TOTAL: 0.7 mg/dL (ref 0.2–1.2)
TOTAL PROTEIN: 6.6 g/dL (ref 6.0–8.3)

## 2015-09-05 LAB — TSH: TSH: 1.25 u[IU]/mL (ref 0.35–4.50)

## 2015-09-05 LAB — PSA: PSA: 0.77 ng/mL (ref 0.10–4.00)

## 2015-09-05 MED ORDER — PREDNISONE 10 MG PO TABS: 10.0000 mg | ORAL_TABLET | Freq: Two times a day (BID) | ORAL | Status: DC

## 2015-09-05 NOTE — Progress Notes (Signed)
Subjective:    Patient ID: Charles Mccoy, male    DOB: [birth year], 64 y.o.   MRN: [id number]  HPI 64 year old patient who is seen today as a acute visit. He was here in the office today for a blood draw prior to a scheduled physical next week.  Complaints include a five-day history of sore throat with postnasal drip and now worsening cough.  He has had no fever.  He has had some mild sputum production.  He does have a history of allergic rhinitis and asthma, but no active wheezing.  Remains on maintenance medications.  Past Medical History  Diagnosis Date  . ALLERGIC RHINITIS 06/19/2007  . ASTHMA 08/17/2008  . HYPERTENSION 06/19/2007  . OSTEOARTHRITIS 10/09/2009  . SINUSITIS 09/15/2007  . COPD (chronic obstructive pulmonary disease) ([place])   . Pneumonia     hx of   . GERD (gastroesophageal reflux disease)     Social History   Social History  . Marital Status: Married    Spouse Name: N/A  . Number of Children: N/A  . Years of Education: N/A   Occupational History  . Not on file.   Social History Main Topics  . Smoking status: Former Smoker    Types: Cigarettes    Quit date: 10/07/2001  . Smokeless tobacco: Never Used  . Alcohol Use: 3.6 oz/week    6 Cans of beer per week     Comment: beer daily  . Drug Use: No  . Sexual Activity: Not on file   Other Topics Concern  . Not on file   Social History Narrative    Past Surgical History  Procedure Laterality Date  . Knee arthroscopy      2 on left knee , 1 on right  . Cyst removed from neck       age 49   . Total knee arthroplasty Left 07/21/2014    Procedure: LEFT TOTAL KNEE ARTHROPLASTY;  Surgeon: Johnn Hai, MD;  Location: [facility];  Service: Orthopedics;  Laterality: Left;    Family History  Problem Relation Age of Onset  . Colon cancer Neg Hx   . Esophageal cancer Neg Hx   . Rectal cancer Neg Hx   . Stomach cancer Neg Hx     Allergies  Allergen Reactions  . Erythromycin Base     REACTION: stomach  irritation    Current Outpatient Prescriptions on File Prior to Visit  Medication Sig Dispense Refill  . acetaminophen (TYLENOL ARTHRITIS PAIN) 650 MG CR tablet Take 1,300 mg by mouth once as needed for pain.    Marland Kitchen albuterol (PROVENTIL HFA;VENTOLIN HFA) 108 (90 BASE) MCG/ACT inhaler Inhale 2 puffs into the lungs every 6 (six) hours as needed for wheezing or shortness of breath.    Marland Kitchen amLODipine (NORVASC) 10 MG tablet TAKE 1 TABLET (10 MG TOTAL) BY MOUTH EVERY MORNING. 90 tablet 1  . celecoxib (CELEBREX) 200 MG capsule Take 200 mg by mouth daily.   0  . cephALEXin (KEFLEX) 500 MG capsule Prior to Dental Appointment  6  . Fluticasone-Salmeterol (ADVAIR) 250-50 MCG/DOSE AEPB Inhale 1 puff into the lungs 2 (two) times daily.    Marland Kitchen ketoconazole (NIZORAL) 2 % shampoo USE DAILY AS DIRECTED 120 mL 2  . levocetirizine (XYZAL) 5 MG tablet Take 5 mg by mouth as needed for allergies.     . montelukast (SINGULAIR) 10 MG tablet TAKE 1 TABLET BY MOUTH AT BEDTIME 90 tablet 1  . omeprazole (PRILOSEC) 20 MG capsule TAKE  1 CAPSULE (20 MG TOTAL) BY MOUTH EVERY MORNING. 90 capsule 3  . PRESCRIPTION MEDICATION Place 1 spray into the nose daily as needed (allergies.). Qnasal nasal spray    . spironolactone (ALDACTONE) 25 MG tablet TAKE 1 TABLET EVERY DAY 90 tablet 0  . vardenafil (LEVITRA) 20 MG tablet Take 20 mg by mouth daily as needed for erectile dysfunction.     No current facility-administered medications on file prior to visit.    BP 138/90 mmHg  Pulse 63  Temp(Src) 98.5 F (36.9 C) (Oral)  Ht 6' 3.5" (1.918 m)  Wt 248 lb (112.492 kg)  BMI 30.58 kg/m2  SpO2 95%      Review of Systems  Constitutional: Positive for activity change, appetite change and fatigue. Negative for fever and chills.  HENT: Positive for congestion, postnasal drip and sore throat. Negative for dental problem, ear pain, hearing loss, tinnitus, trouble swallowing and voice change.   Eyes: Negative for pain, discharge and visual  disturbance.  Respiratory: Positive for cough. Negative for chest tightness, wheezing and stridor.   Cardiovascular: Negative for chest pain, palpitations and leg swelling.  Gastrointestinal: Negative for nausea, vomiting, abdominal pain, diarrhea, constipation, blood in stool and abdominal distention.  Genitourinary: Negative for urgency, hematuria, flank pain, discharge, difficulty urinating and genital sores.  Musculoskeletal: Negative for myalgias, back pain, joint swelling, arthralgias, gait problem and neck stiffness.  Skin: Negative for rash.  Neurological: Negative for dizziness, syncope, speech difficulty, weakness, numbness and headaches.  Hematological: Negative for adenopathy. Does not bruise/bleed easily.  Psychiatric/Behavioral: Negative for behavioral problems and dysphoric mood. The patient is not nervous/anxious.        Objective:   Physical Exam  Constitutional: He is oriented to person, place, and time. He appears well-developed and well-nourished. No distress.  Afebrile No distress Hoarse  HENT:  Head: Normocephalic.  Right Ear: External ear normal.  Left Ear: External ear normal.  Eyes: Conjunctivae and EOM are normal.  Neck: Normal range of motion.  Cardiovascular: Normal rate and normal heart sounds.   Pulmonary/Chest: Effort normal and breath sounds normal. He has no wheezes.  Abdominal: Bowel sounds are normal.  Musculoskeletal: Normal range of motion. He exhibits no edema or tenderness.  Neurological: He is alert and oriented to person, place, and time.  Psychiatric: He has a normal mood and affect. His behavior is normal.          Assessment & Plan:   Viral bronchitis with cough.  Will treat symptomatically Asthma, stable Hypertension, stable   CPX as scheduled

## 2015-09-05 NOTE — Progress Notes (Signed)
Pre visit review using our clinic review tool, if applicable. No additional management support is needed unless otherwise documented below in the visit note. 

## 2015-09-05 NOTE — Patient Instructions (Signed)
Acute bronchitis symptoms for less than 10 days are generally not helped by antibiotics.  Take over-the-counter expectorants and cough medications such as  Mucinex DM.  Call if there is no improvement in 5 to 7 days or if  you develop worsening cough, fever, or new symptoms, such as shortness of breath or chest pain.  HOME CARE INSTRUCTIONS  Get plenty of rest.   Drink enough fluids to keep your urine clear or pale yellow (unless you have a medical condition that requires fluid restriction). Increasing fluids may help thin your respiratory secretions (sputum) and reduce chest congestion, and it will prevent dehydration.   Take medicines only as directed by your health care provider.   Avoid smoking and secondhand smoke. Exposure to cigarette smoke or irritating chemicals will make bronchitis worse.  Reduce the chances of another bout of acute bronchitis by washing your hands frequently, avoiding people with cold symptoms, and trying not to touch your hands to your mouth, nose, or eyes.

## 2015-09-12 ENCOUNTER — Ambulatory Visit (INDEPENDENT_AMBULATORY_CARE_PROVIDER_SITE_OTHER): Payer: Managed Care, Other (non HMO) | Admitting: Internal Medicine

## 2015-09-12 ENCOUNTER — Encounter: Payer: Self-pay | Admitting: Internal Medicine

## 2015-09-12 VITALS — BP 126/80 | HR 78 | Temp 98.5°F | Resp 20 | Ht 75.5 in | Wt 251.0 lb

## 2015-09-12 DIAGNOSIS — Z0000 Encounter for general adult medical examination without abnormal findings: Secondary | ICD-10-CM | POA: Diagnosis not present

## 2015-09-12 DIAGNOSIS — J309 Allergic rhinitis, unspecified: Secondary | ICD-10-CM

## 2015-09-12 DIAGNOSIS — Z23 Encounter for immunization: Secondary | ICD-10-CM

## 2015-09-12 DIAGNOSIS — M1712 Unilateral primary osteoarthritis, left knee: Secondary | ICD-10-CM

## 2015-09-12 DIAGNOSIS — I10 Essential (primary) hypertension: Secondary | ICD-10-CM

## 2015-09-12 NOTE — Progress Notes (Signed)
Pre visit review using our clinic review tool, if applicable. No additional management support is needed unless otherwise documented below in the visit note. 

## 2015-09-12 NOTE — Progress Notes (Signed)
Subjective:    Patient ID: Charles Mccoy, male    DOB: [birth year], 64 y.o.   MRN: [id number]  HPI  64 year old patient who is seen today for a preventive health examination He has a history of asthma, allergic rhinitis which has been fairly stable, although recovering from a recent URI. He is status post left total knee replacement surgery.  Has a history of treated hypertension which has been stable  Past Medical History  Diagnosis Date  . ALLERGIC RHINITIS 06/19/2007  . ASTHMA 08/17/2008  . HYPERTENSION 06/19/2007  . OSTEOARTHRITIS 10/09/2009  . SINUSITIS 09/15/2007  . COPD (chronic obstructive pulmonary disease) ([place])   . Pneumonia     hx of   . GERD (gastroesophageal reflux disease)     Social History   Social History  . Marital Status: Married    Spouse Name: N/A  . Number of Children: N/A  . Years of Education: N/A   Occupational History  . Not on file.   Social History Main Topics  . Smoking status: Former Smoker    Types: Cigarettes    Quit date: 10/07/2001  . Smokeless tobacco: Never Used  . Alcohol Use: 3.6 oz/week    6 Cans of beer per week     Comment: beer daily  . Drug Use: No  . Sexual Activity: Not on file   Other Topics Concern  . Not on file   Social History Narrative    Past Surgical History  Procedure Laterality Date  . Knee arthroscopy      2 on left knee , 1 on right  . Cyst removed from neck       age 89   . Total knee arthroplasty Left 07/21/2014    Procedure: LEFT TOTAL KNEE ARTHROPLASTY;  Surgeon: Johnn Hai, MD;  Location: [facility];  Service: Orthopedics;  Laterality: Left;    Family History  Problem Relation Age of Onset  . Colon cancer Neg Hx   . Esophageal cancer Neg Hx   . Rectal cancer Neg Hx   . Stomach cancer Neg Hx     Allergies  Allergen Reactions  . Erythromycin Base     REACTION: stomach irritation    Current Outpatient Prescriptions on File Prior to Visit  Medication Sig Dispense Refill  . acetaminophen  (TYLENOL ARTHRITIS PAIN) 650 MG CR tablet Take 1,300 mg by mouth once as needed for pain.    Marland Kitchen albuterol (PROVENTIL HFA;VENTOLIN HFA) 108 (90 BASE) MCG/ACT inhaler Inhale 2 puffs into the lungs every 6 (six) hours as needed for wheezing or shortness of breath.    Marland Kitchen amLODipine (NORVASC) 10 MG tablet TAKE 1 TABLET (10 MG TOTAL) BY MOUTH EVERY MORNING. 90 tablet 1  . celecoxib (CELEBREX) 200 MG capsule Take 200 mg by mouth daily.   0  . cephALEXin (KEFLEX) 500 MG capsule Prior to Dental Appointment  6  . Fluticasone-Salmeterol (ADVAIR) 250-50 MCG/DOSE AEPB Inhale 1 puff into the lungs 2 (two) times daily.    Marland Kitchen ketoconazole (NIZORAL) 2 % shampoo USE DAILY AS DIRECTED 120 mL 2  . levocetirizine (XYZAL) 5 MG tablet Take 5 mg by mouth as needed for allergies.     . montelukast (SINGULAIR) 10 MG tablet TAKE 1 TABLET BY MOUTH AT BEDTIME 90 tablet 1  . omeprazole (PRILOSEC) 20 MG capsule TAKE 1 CAPSULE (20 MG TOTAL) BY MOUTH EVERY MORNING. 90 capsule 3  . PRESCRIPTION MEDICATION Place 1 spray into the nose daily as needed (allergies.). Qnasal  nasal spray    . spironolactone (ALDACTONE) 25 MG tablet TAKE 1 TABLET EVERY DAY 90 tablet 0  . vardenafil (LEVITRA) 20 MG tablet Take 20 mg by mouth daily as needed for erectile dysfunction.     No current facility-administered medications on file prior to visit.    BP 126/80 mmHg  Pulse 78  Temp(Src) 98.5 F (36.9 C) (Oral)  Resp 20  Ht 6' 3.5" (1.918 m)  Wt 251 lb (113.853 kg)  BMI 30.95 kg/m2  SpO2 98%   Family history father died at age 82, complications of laryngeal cancer.  Mother history of hypertension.  One brother one sister in good health   Review of Systems  Constitutional: Negative for fever, chills, activity change, appetite change and fatigue.  HENT: Positive for congestion and postnasal drip. Negative for dental problem, ear pain, hearing loss, mouth sores, rhinorrhea, sinus pressure, sneezing, tinnitus, trouble swallowing and voice  change.   Eyes: Negative for photophobia, pain, redness and visual disturbance.  Respiratory: Positive for cough. Negative for apnea, choking, chest tightness, shortness of breath and wheezing.   Cardiovascular: Negative for chest pain, palpitations and leg swelling.  Gastrointestinal: Negative for nausea, vomiting, abdominal pain, diarrhea, constipation, blood in stool, abdominal distention, anal bleeding and rectal pain.  Genitourinary: Negative for dysuria, urgency, frequency, hematuria, flank pain, decreased urine volume, discharge, penile swelling, scrotal swelling, difficulty urinating, genital sores and testicular pain.  Musculoskeletal: Negative for myalgias, back pain, joint swelling, arthralgias, gait problem, neck pain and neck stiffness.  Skin: Negative for color change, rash and wound.  Neurological: Negative for dizziness, tremors, seizures, syncope, facial asymmetry, speech difficulty, weakness, light-headedness, numbness and headaches.  Hematological: Negative for adenopathy. Does not bruise/bleed easily.  Psychiatric/Behavioral: Negative for suicidal ideas, hallucinations, behavioral problems, confusion, sleep disturbance, self-injury, dysphoric mood, decreased concentration and agitation. The patient is not nervous/anxious.        Objective:   Physical Exam  Constitutional: He appears well-developed and well-nourished.  Blood pressure 122/76  HENT:  Head: Normocephalic and atraumatic.  Right Ear: External ear normal.  Left Ear: External ear normal.  Nose: Nose normal.  Mouth/Throat: Oropharynx is clear and moist.  Eyes: Conjunctivae and EOM are normal. Pupils are equal, round, and reactive to light. No scleral icterus.  Neck: Normal range of motion. Neck supple. No JVD present. No thyromegaly present.  Cardiovascular: Regular rhythm, normal heart sounds and intact distal pulses.  Exam reveals no gallop and no friction rub.   No murmur heard. Pulmonary/Chest: Effort  normal and breath sounds normal. He exhibits no tenderness.  Abdominal: Soft. Bowel sounds are normal. He exhibits no distension and no mass. There is no tenderness.  Genitourinary: Prostate normal and penis normal.  Uncircumcised  Musculoskeletal: Normal range of motion. He exhibits no edema or tenderness.  Status post left total knee replacement surgery  Lymphadenopathy:    He has no cervical adenopathy.  Neurological: He is alert. He has normal reflexes. No cranial nerve deficit. Coordination normal.  Skin: Skin is warm and dry. No rash noted.  Psychiatric: He has a normal mood and affect. His behavior is normal.          Assessment & Plan:   Preventive health examination Asthma/allergic rhinitis, stable.  Continue maintenance medications Hypertension, well-controlled.  Continue low-salt diet.  Home blood pressure monitoring.  Weight loss encouraged.  Patient will call a blood pressure greater than 140 over 90 consistently.  Recheck one year or as needed Laboratory studies discussed Flu vaccine  administered

## 2015-09-12 NOTE — Patient Instructions (Signed)
Limit your sodium (Salt) intake  Please check your blood pressure on a regular basis.  If it is consistently greater than 150/90, please make an office appointment.    It is important that you exercise regularly, at least 20 minutes 3 to 4 times per week.  If you develop chest pain or shortness of breath seek  medical attention.  Return in one year for follow-up   Health Maintenance, Male A healthy lifestyle and preventative care can promote health and wellness.  Maintain regular health, dental, and eye exams.  Eat a healthy diet. Foods like vegetables, fruits, whole grains, low-fat dairy products, and lean protein foods contain the nutrients you need and are low in calories. Decrease your intake of foods high in solid fats, added sugars, and salt. Get information about a proper diet from your health care provider, if necessary.  Regular physical exercise is one of the most important things you can do for your health. Most adults should get at least 150 minutes of moderate-intensity exercise (any activity that increases your heart rate and causes you to sweat) each week. In addition, most adults need muscle-strengthening exercises on 2 or more days a week.   Maintain a healthy weight. The body mass index (BMI) is a screening tool to identify possible weight problems. It provides an estimate of body fat based on height and weight. Your health care provider can find your BMI and can help you achieve or maintain a healthy weight. For males 20 years and older:  A BMI below 18.5 is considered underweight.  A BMI of 18.5 to 24.9 is normal.  A BMI of 25 to 29.9 is considered overweight.  A BMI of 30 and above is considered obese.  Maintain normal blood lipids and cholesterol by exercising and minimizing your intake of saturated fat. Eat a balanced diet with plenty of fruits and vegetables. Blood tests for lipids and cholesterol should begin at age 20 and be repeated every 5 years. If your lipid  or cholesterol levels are high, you are over age 50, or you are at high risk for heart disease, you may need your cholesterol levels checked more frequently.Ongoing high lipid and cholesterol levels should be treated with medicines if diet and exercise are not working.  If you smoke, find out from your health care provider how to quit. If you do not use tobacco, do not start.  Lung cancer screening is recommended for adults aged 55-80 years who are at high risk for developing lung cancer because of a history of smoking. A yearly low-dose CT scan of the lungs is recommended for people who have at least a 30-pack-year history of smoking and are current smokers or have quit within the past 15 years. A pack year of smoking is smoking an average of 1 pack of cigarettes a day for 1 year (for example, a 30-pack-year history of smoking could mean smoking 1 pack a day for 30 years or 2 packs a day for 15 years). Yearly screening should continue until the smoker has stopped smoking for at least 15 years. Yearly screening should be stopped for people who develop a health problem that would prevent them from having lung cancer treatment.  If you choose to drink alcohol, do not have more than 2 drinks per day. One drink is considered to be 12 oz (360 mL) of beer, 5 oz (150 mL) of wine, or 1.5 oz (45 mL) of liquor.  Avoid the use of street drugs. Do not   share needles with anyone. Ask for help if you need support or instructions about stopping the use of drugs.  High blood pressure causes heart disease and increases the risk of stroke. High blood pressure is more likely to develop in:  People who have blood pressure in the end of the normal range (100-139/85-89 mm Hg).  People who are overweight or obese.  People who are African American.  If you are 18-39 years of age, have your blood pressure checked every 3-5 years. If you are 40 years of age or older, have your blood pressure checked every year. You should  have your blood pressure measured twice--once when you are at a hospital or clinic, and once when you are not at a hospital or clinic. Record the average of the two measurements. To check your blood pressure when you are not at a hospital or clinic, you can use:  An automated blood pressure machine at a pharmacy.  A home blood pressure monitor.  If you are 45-79 years old, ask your health care provider if you should take aspirin to prevent heart disease.  Diabetes screening involves taking a blood sample to check your fasting blood sugar level. This should be done once every 3 years after age 45 if you are at a normal weight and without risk factors for diabetes. Testing should be considered at a younger age or be carried out more frequently if you are overweight and have at least 1 risk factor for diabetes.  Colorectal cancer can be detected and often prevented. Most routine colorectal cancer screening begins at the age of 50 and continues through age 75. However, your health care provider may recommend screening at an earlier age if you have risk factors for colon cancer. On a yearly basis, your health care provider may provide home test kits to check for hidden blood in the stool. A small camera at the end of a tube may be used to directly examine the colon (sigmoidoscopy or colonoscopy) to detect the earliest forms of colorectal cancer. Talk to your health care provider about this at age 50 when routine screening begins. A direct exam of the colon should be repeated every 5-10 years through age 75, unless early forms of precancerous polyps or small growths are found.  People who are at an increased risk for hepatitis B should be screened for this virus. You are considered at high risk for hepatitis B if:  You were born in a country where hepatitis B occurs often. Talk with your health care provider about which countries are considered high risk.  Your parents were born in a high-risk country and  you have not received a shot to protect against hepatitis B (hepatitis B vaccine).  You have HIV or AIDS.  You use needles to inject street drugs.  You live with, or have sex with, someone who has hepatitis B.  You are a man who has sex with other men (MSM).  You get hemodialysis treatment.  You take certain medicines for conditions like cancer, organ transplantation, and autoimmune conditions.  Hepatitis C blood testing is recommended for all people born from 1945 through 1965 and any individual with known risk factors for hepatitis C.  Healthy men should no longer receive prostate-specific antigen (PSA) blood tests as part of routine cancer screening. Talk to your health care provider about prostate cancer screening.  Testicular cancer screening is not recommended for adolescents or adult males who have no symptoms. Screening includes self-exam, a health   care provider exam, and other screening tests. Consult with your health care provider about any symptoms you have or any concerns you have about testicular cancer.  Practice safe sex. Use condoms and avoid high-risk sexual practices to reduce the spread of sexually transmitted infections (STIs).  You should be screened for STIs, including gonorrhea and chlamydia if:  You are sexually active and are younger than 24 years.  You are older than 24 years, and your health care provider tells you that you are at risk for this type of infection.  Your sexual activity has changed since you were last screened, and you are at an increased risk for chlamydia or gonorrhea. Ask your health care provider if you are at risk.  If you are at risk of being infected with HIV, it is recommended that you take a prescription medicine daily to prevent HIV infection. This is called pre-exposure prophylaxis (PrEP). You are considered at risk if:  You are a man who has sex with other men (MSM).  You are a heterosexual man who is sexually active with  multiple partners.  You take drugs by injection.  You are sexually active with a partner who has HIV.  Talk with your health care provider about whether you are at high risk of being infected with HIV. If you choose to begin PrEP, you should first be tested for HIV. You should then be tested every 3 months for as long as you are taking PrEP.  Use sunscreen. Apply sunscreen liberally and repeatedly throughout the day. You should seek shade when your shadow is shorter than you. Protect yourself by wearing long sleeves, pants, a wide-brimmed hat, and sunglasses year round whenever you are outdoors.  Tell your health care provider of new moles or changes in moles, especially if there is a change in shape or color. Also, tell your health care provider if a mole is larger than the size of a pencil eraser.  A one-time screening for abdominal aortic aneurysm (AAA) and surgical repair of large AAAs by ultrasound is recommended for men aged 65-75 years who are current or former smokers.  Stay current with your vaccines (immunizations).   This information is not intended to replace advice given to you by your health care provider. Make sure you discuss any questions you have with your health care provider.   Document Released: 03/21/2008 Document Revised: 10/14/2014 Document Reviewed: 02/18/2011 Elsevier Interactive Patient Education 2016 [employer]  

## 2015-09-15 ENCOUNTER — Other Ambulatory Visit: Payer: Self-pay | Admitting: Internal Medicine

## 2016-05-07 DIAGNOSIS — M549 Dorsalgia, unspecified: Secondary | ICD-10-CM

## 2016-05-07 HISTORY — DX: Dorsalgia, unspecified: M54.9

## 2016-05-20 ENCOUNTER — Other Ambulatory Visit (HOSPITAL_COMMUNITY): Payer: Managed Care, Other (non HMO)

## 2016-05-20 ENCOUNTER — Ambulatory Visit: Payer: Self-pay | Admitting: Orthopedic Surgery

## 2016-05-21 ENCOUNTER — Ambulatory Visit: Payer: Self-pay | Admitting: Orthopedic Surgery

## 2016-05-21 ENCOUNTER — Encounter (HOSPITAL_COMMUNITY)
Admission: RE | Admit: 2016-05-21 | Discharge: 2016-05-21 | Disposition: A | Discharge status: Home / Self Care | Payer: Managed Care, Other (non HMO) | Source: Ambulatory Visit | Attending: Specialist | Admitting: Specialist

## 2016-05-21 ENCOUNTER — Encounter (HOSPITAL_COMMUNITY): Payer: Self-pay

## 2016-05-21 DIAGNOSIS — Z96652 Presence of left artificial knee joint: Secondary | ICD-10-CM | POA: Insufficient documentation

## 2016-05-21 DIAGNOSIS — I10 Essential (primary) hypertension: Secondary | ICD-10-CM | POA: Diagnosis not present

## 2016-05-21 DIAGNOSIS — Z01812 Encounter for preprocedural laboratory examination: Secondary | ICD-10-CM | POA: Diagnosis not present

## 2016-05-21 DIAGNOSIS — J449 Chronic obstructive pulmonary disease, unspecified: Secondary | ICD-10-CM | POA: Insufficient documentation

## 2016-05-21 DIAGNOSIS — Z01810 Encounter for preprocedural cardiovascular examination: Secondary | ICD-10-CM | POA: Insufficient documentation

## 2016-05-21 DIAGNOSIS — M170 Bilateral primary osteoarthritis of knee: Secondary | ICD-10-CM | POA: Insufficient documentation

## 2016-05-21 DIAGNOSIS — K219 Gastro-esophageal reflux disease without esophagitis: Secondary | ICD-10-CM | POA: Diagnosis not present

## 2016-05-21 DIAGNOSIS — R001 Bradycardia, unspecified: Secondary | ICD-10-CM | POA: Insufficient documentation

## 2016-05-21 HISTORY — DX: Essential (primary) hypertension: I10

## 2016-05-21 LAB — CBC
HEMATOCRIT: 42.4 % (ref 39.0–52.0)
HEMOGLOBIN: 14.4 g/dL (ref 13.0–17.0)
MCH: 28.2 pg (ref 26.0–34.0)
MCHC: 34 g/dL (ref 30.0–36.0)
MCV: 83 fL (ref 78.0–100.0)
Platelets: 230 10*3/uL (ref 150–400)
RBC: 5.11 MIL/uL (ref 4.22–5.81)
RDW: 13.8 % (ref 11.5–15.5)
WBC: 6.9 10*3/uL (ref 4.0–10.5)

## 2016-05-21 LAB — BASIC METABOLIC PANEL
Anion gap: 5 (ref 5–15)
BUN: 17 mg/dL (ref 6–20)
CALCIUM: 8.8 mg/dL — AB (ref 8.9–10.3)
CO2: 27 mmol/L (ref 22–32)
CREATININE: 0.99 mg/dL (ref 0.61–1.24)
Chloride: 105 mmol/L (ref 101–111)
GFR calc Af Amer: >60 mL/min (ref >60)
Glucose, Bld: 101 mg/dL — ABNORMAL HIGH (ref 65–99)
POTASSIUM: 4.7 mmol/L (ref 3.5–5.1)
SODIUM: 137 mmol/L (ref 135–145)

## 2016-05-21 LAB — URINALYSIS, ROUTINE W REFLEX MICROSCOPIC
Bilirubin Urine: NEGATIVE
Glucose, UA: NEGATIVE mg/dL
Hgb urine dipstick: NEGATIVE
Ketones, ur: NEGATIVE mg/dL
LEUKOCYTES UA: NEGATIVE
NITRITE: NEGATIVE
PH: 5 (ref 5.0–8.0)
Protein, ur: NEGATIVE mg/dL
SPECIFIC GRAVITY, URINE: 1.012 (ref 1.005–1.030)

## 2016-05-21 LAB — PROTIME-INR
INR: 1.09
PROTHROMBIN TIME: 14.2 s (ref 11.4–15.2)

## 2016-05-21 LAB — SURGICAL PCR SCREEN
MRSA, PCR: NEGATIVE
STAPHYLOCOCCUS AUREUS: NEGATIVE

## 2016-05-21 LAB — APTT: APTT: 32 s (ref 24–36)

## 2016-05-21 NOTE — H&P (Signed)
Charles Mccoy DOB: 1951/01/03 Married / Language: [name] / Race: White Male  H&P Date: 05/20/16  Chief Complaint: Right knee pain  History of Present Illness  The patient is a 65 year old male who comes in today for a preoperative History and Physical. The patient is scheduled for a right total knee arthroplasty to be performed by Dr. Johnn Hai, MD at [facility] on 05/30/2016. [name] reports chronic pain in the right knee for years. He is s/p L TKA which is doing very well. The right knee has onoing pain, and increased varus defority, refractory to bracing, activity modifications, quad strengthening, medications, HEP, quad strengthening, relative rest, steroid and viscosupplementation injections. Pain is interfering with ADLs and quality of life at this poiont and pt desires to proceed with surgery.  Dr. Tonita Cong and the patient mutually agreed to proceed with a right total knee replacement. Risks and benefits of the procedure were discussed including stiffness, suboptimal range of motion, persistent pain, infection requiring removal of prosthesis and reinsertion, need for prophylactic antibiotics in the future, for example, dental procedures, possible need for manipulation, revision in the future and also anesthetic complications including DVT, PE, etc. We discussed the perioperative course, time in the hospital, postoperative recovery and the need for elevation to control swelling. We also discussed the predicted range of motion and the probability that squatting and kneeling would be unobtainable in the future. In addition, postoperative anticoagulation was discussed. We have obtained preoperative medical clearance as necessary. Provided her illustrated handout and discussed it in detail. They will enroll in the total joint replacement educational forum at the hospital. His pre-op appt at the hospital is scheduled for later today.   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 grandfather mothers side Cancer  Father. father Osteoarthritis  Mother. mother Hypertension  Maternal Grandmother, Mother. mother First Degree Relatives   Social History Tobacco use  Former smoker. 04/06/2014: smoke(d) 1 pack(s) per day former smoker Living situation  live with spouse. 15 steps to enter home. 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 Alcohol use  current drinker; drinks beer; 8-14 per week Not under pain contract  No history of drug/alcohol rehab  Number of flights of stairs before winded  4-5 greater than 5 Tobacco / smoke exposure  04/06/2014: yes outdoors only yes outdoors only Drug/Alcohol Rehab (Currently)  no Current drinker  04/06/2014: Currently drinks beer, wine and hard liquor 5-7 times per week Children  2 Current work status  working full time Marital status  married Exercise  Exercises weekly; does running / walking Exercises daily; does individual sport  Medication History Celecoxib (200MG  Capsule, 1 (one) Oral po qd pc, Taken starting 05/20/2016) Active. Singulair (10MG  Tablet, Oral) Active. Montelukast Sodium (10MG  Tablet, Oral) Active. AmLODIPine  Besylate (10MG  Tablet, Oral daily) Active. Omeprazole (20MG  Capsule DR,  Oral) Active. ProAir HFA (108 (90 Base)MCG/ACT Aerosol Soln, Inhalation) Active. Advair Diskus (250-50MCG/DOSE Aero Pow Br Act, Inhalation) Active. Medications Reconciled Spironolactone (25MG  Tablet, Oral daily) Active.  Past Surgical History Arthroscopy of Knee  bilateral Total Knee Replacement - Left   Review of Systems General Not Present- Chills, Fatigue, Fever, Memory Loss, Night Sweats, Weight Gain and Weight Loss. Skin Not Present- Eczema, Hives, Itching, Lesions and Rash. HEENT Not Present- Dentures, Double Vision, Headache, Hearing Loss, Tinnitus and Visual Loss. Respiratory Present- Allergies. Not Present- Chronic Cough, Coughing up blood, Shortness of breath at rest and Shortness of breath with exertion. Cardiovascular Not Present- Chest Pain, Difficulty Breathing Lying Down, Murmur, Palpitations, Racing/skipping heartbeats and Swelling. Gastrointestinal Not Present- Abdominal Pain, Bloody Stool, Constipation, Diarrhea, Difficulty Swallowing, Heartburn, Jaundice, Loss of appetitie, Nausea and Vomiting. Male Genitourinary Present- Urinating at Night. Not Present- Blood in Urine, Discharge, Flank Pain, Incontinence, Painful Urination, Urgency, Urinary frequency, Urinary Retention and Weak urinary stream. Musculoskeletal Present- Joint Pain and Joint Swelling. Not Present- Back Pain, Morning Stiffness, Muscle Pain, Muscle Weakness and Spasms. Neurological Not Present- Blackout spells, Difficulty with balance, Dizziness, Paralysis, Tremor and Weakness. Psychiatric Not Present- Insomnia.  Physical Exam General Mental Status -Alert, cooperative and good historian. General Appearance-pleasant, Not in acute distress. Orientation-Oriented X3. Build & Nutrition-Well nourished and Well developed.  Head and Neck Head-normocephalic, atraumatic . Neck Global Assessment - supple, no bruit  auscultated on the right, no bruit auscultated on the left.  Eye Pupil - Bilateral-Regular and Round. Motion - Bilateral-EOMI.  Chest and Lung Exam Auscultation Breath sounds - clear at anterior chest wall and clear at posterior chest wall. Adventitious sounds - No Adventitious sounds.  Cardiovascular Auscultation Rhythm - Regular rate and rhythm. Heart Sounds - S1 WNL and S2 WNL. Murmurs & Other Heart Sounds - Auscultation of the heart reveals - No Murmurs.  Abdomen Palpation/Percussion Tenderness - Abdomen is non-tender to palpation. Rigidity (guarding) - Abdomen is soft. Auscultation Auscultation of the abdomen reveals - Bowel sounds normal.  Male Genitourinary Note: Not done, not pertinent to present illness  Musculoskeletal Note: On exam of the right knee, he is tender to palpation medial and lateral joint lines. Trace effusion, no sign of DVT or positive patellofemoral pain with compression. He has full range of motion. Does have an antalgic gait.  Imaging xrays with end-stage degenerative changes right knee, bone-on-bone medial compartment, varus deformity.  Assessment & Plan Primary osteoarthritis of right knee (M17.11)  Pt with end-stage right knee DJD, bone-on-bone, refractory to conservative tx, scheduled for right total knee replacement by Dr. Tonita Cong on 05/30/16. We again discussed the procedure itself as well as risks, complications and alternatives, including but not limited to DVT, PE, infx, bleeding, failure of procedure, need for secondary procedure including manipulation, nerve injury, ongoing pain/symptoms, anesthesia risk, even stroke or death. Also discussed typical post-op protocols, activity restrictions, need for PT, flexion/extension exercises, time out of work. Discussed need for DVT ppx post-op with Xarelto then ASA per protocol. Discussed dental ppx. Also discussed limitations post-operatively such as kneeling and squatting. All questions were answered.  Patient desires to proceed with surgery as scheduled. Will hold ASA and NSAIDs accordingly. Will remain NPO after MN night before surgery. Will present to [facility] for pre-op testing. Plan Xarelto 2 weeks post-op for DVT ppx then ASA. Plan Percocet for pain, Robaxin, Colace, Miralax. Plan short stay at SNF post-op prior to return hoem with family members at home for assistance, due to flight of 15 stairs  to enter. He stayed at [facility] following his contralateral side. Will follow up 10-14 days post-op for staple removal and xrays.  Plan right total knee replacement  Signed electronically by Lacie Draft, PA-C for Dr. Tonita Cong

## 2016-05-21 NOTE — Patient Instructions (Addendum)
Charles Mccoy  05/21/2016   Your procedure is scheduled on: 05-30-16   Report to [facility]  Entrance take [facility]  elevators to 3rd floor to  [place] at   [place] AM.  Call this number if you have problems the morning of surgery [phone]   Remember: ONLY 1 PERSON MAY GO WITH YOU TO SHORT STAY TO GET  READY MORNING OF Charles Mccoy.  Do not eat food or drink liquids :After Midnight.     Take these medicines the morning of surgery with A SIP OF WATER: Amlodipine. Omeprazole. Tylenol-if need.  DO NOT TAKE ANY DIABETIC MEDICATIONS DAY OF YOUR SURGERY                               You may not have any metal on your body including hair pins and              piercings  Do not wear jewelry, make-up, lotions, powders or perfumes, deodorant             Do not wear nail polish.  Do not shave  48 hours prior to surgery.              Men may shave face and neck.   Do not bring valuables to the hospital. [name].  Contacts, dentures or bridgework may not be worn into surgery.  Leave suitcase in the car. After surgery it may be brought to your room.     Patients discharged the day of surgery will not be allowed to drive home.  Name and phone number of your driver: Charles Mccoy spouse [phone] cell  Special Instructions: N/A              Please read over the following fact sheets you were given: _____________________________________________________________________             [name] Before surgery, you can play an important role.  Because skin is not sterile, your skin needs to be as free of germs as possible.  You can reduce the number of germs on your skin by washing with CHG (chlorahexidine gluconate) soap before surgery.  CHG is an antiseptic cleaner which kills germs and bonds with the skin to continue killing germs even after washing. Please DO NOT use if you have an allergy to  CHG or antibacterial soaps.  If your skin becomes reddened/irritated stop using the CHG and inform your nurse when you arrive at Short Stay. Do not shave (including legs and underarms) for at least 48 hours prior to the first CHG shower.  You may shave your face/neck. Please follow these instructions carefully:  1.  Shower with CHG Soap the night before surgery and the  morning of Surgery.  2.  If you choose to wash your hair, wash your hair first as usual with your  normal  shampoo.  3.  After you shampoo, rinse your hair and body thoroughly to remove the  shampoo.                           4.  Use CHG as you would any other liquid soap.  You can apply  chg directly  to the skin and wash                       Gently with a scrungie or clean washcloth.  5.  Apply the CHG Soap to your body ONLY FROM THE NECK DOWN.   Do not use on face/ open                           Wound or open sores. Avoid contact with eyes, ears mouth and genitals (private parts).                       Wash face,  Genitals (private parts) with your normal soap.             6.  Wash thoroughly, paying special attention to the area where your surgery  will be performed.  7.  Thoroughly rinse your body with warm water from the neck down.  8.  DO NOT shower/wash with your normal soap after using and rinsing off  the CHG Soap.                9.  Pat yourself dry with a clean towel.            10.  Wear clean pajamas.            11.  Place clean sheets on your bed the night of your first shower and do not  sleep with pets. Day of Surgery : Do not apply any lotions/deodorants the morning of surgery.  Please wear clean clothes to the hospital/surgery center.  FAILURE TO FOLLOW THESE INSTRUCTIONS MAY RESULT IN THE CANCELLATION OF YOUR SURGERY PATIENT SIGNATURE_________________________________  NURSE SIGNATURE__________________________________  ________________________________________________________________________   Charles Mccoy  An incentive spirometer is a tool that can help keep your lungs clear and active. This tool measures how well you are filling your lungs with each breath. Taking long deep breaths may help reverse or decrease the chance of developing breathing (pulmonary) problems (especially infection) following:  A long period of time when you are unable to move or be active. BEFORE THE PROCEDURE   If the spirometer includes an indicator to show your best effort, your nurse or respiratory therapist will set it to a desired goal.  If possible, sit up straight or lean slightly forward. Try not to slouch.  Hold the incentive spirometer in an upright position. INSTRUCTIONS FOR USE  1. Sit on the edge of your bed if possible, or sit up as far as you can in bed or on a chair. 2. Hold the incentive spirometer in an upright position. 3. Breathe out normally. 4. Place the mouthpiece in your mouth and seal your lips tightly around it. 5. Breathe in slowly and as deeply as possible, raising the piston or the ball toward the top of the column. 6. Hold your breath for 3-5 seconds or for as long as possible. Allow the piston or ball to fall to the bottom of the column. 7. Remove the mouthpiece from your mouth and breathe out normally. 8. Rest for a few seconds and repeat Steps 1 through 7 at least 10 times every 1-2 hours when you are awake. Take your time and take a few normal breaths between deep breaths. 9. The spirometer may include an indicator to show your best effort. Use the indicator as a goal to work toward during each repetition.  10. After each set of 10 deep breaths, practice coughing to be sure your lungs are clear. If you have an incision (the cut made at the time of surgery), support your incision when coughing by placing a pillow or rolled up towels firmly against it. Once you are able to get out of bed, walk around indoors and cough well. You may stop using the incentive spirometer when  instructed by your caregiver.  RISKS AND COMPLICATIONS  Take your time so you do not get dizzy or light-headed.  If you are in pain, you may need to take or ask for pain medication before doing incentive spirometry. It is harder to take a deep breath if you are having pain. AFTER USE  Rest and breathe slowly and easily.  It can be helpful to keep track of a log of your progress. Your caregiver can provide you with a simple table to help with this. If you are using the spirometer at home, follow these instructions: [place] IF:   You are having difficultly using the spirometer.  You have trouble using the spirometer as often as instructed.  Your pain medication is not giving enough relief while using the spirometer.  You develop fever of 100.5 F (38.1 C) or higher. SEEK IMMEDIATE MEDICAL CARE IF:   You cough up bloody sputum that had not been present before.  You develop fever of 102 F (38.9 C) or greater.  You develop worsening pain at or near the incision site. MAKE SURE YOU:   Understand these instructions.  Will watch your condition.  Will get help right away if you are not doing well or get worse. Document Released: 02/03/2007 Document Revised: 12/16/2011 Document Reviewed: 04/06/2007 Exit[employer] Patient Information 2014 [employer], [place].   ________________________________________________________________________  WHAT IS A BLOOD TRANSFUSION? Blood Transfusion Information  A transfusion is the replacement of blood or some of its parts. Blood is made up of multiple cells which provide different functions.  Red blood cells carry oxygen and are used for blood loss replacement.  White blood cells fight against infection.  Platelets control bleeding.  Plasma helps clot blood.  Other blood products are available for specialized needs, such as hemophilia or other clotting disorders. BEFORE THE TRANSFUSION  Who gives blood for transfusions?   Healthy  volunteers who are fully evaluated to make sure their blood is safe. This is blood bank blood. Transfusion therapy is the safest it has ever been in the practice of medicine. Before blood is taken from a donor, a complete history is taken to make sure that person has no history of diseases nor engages in risky social behavior (examples are intravenous drug use or sexual activity with multiple partners). The donor's travel history is screened to minimize risk of transmitting infections, such as malaria. The donated blood is tested for signs of infectious diseases, such as HIV and hepatitis. The blood is then tested to be sure it is compatible with you in order to minimize the chance of a transfusion reaction. If you or a relative donates blood, this is often done in anticipation of surgery and is not appropriate for emergency situations. It takes many days to process the donated blood. RISKS AND COMPLICATIONS Although transfusion therapy is very safe and saves many lives, the main dangers of transfusion include:   Getting an infectious disease.  Developing a transfusion reaction. This is an allergic reaction to something in the blood you were given. Every precaution is taken to prevent this. The  decision to have a blood transfusion has been considered carefully by your caregiver before blood is given. Blood is not given unless the benefits outweigh the risks. AFTER THE TRANSFUSION  Right after receiving a blood transfusion, you will usually feel much better and more energetic. This is especially true if your red blood cells have gotten low (anemic). The transfusion raises the level of the red blood cells which carry oxygen, and this usually causes an energy increase.  The nurse administering the transfusion will monitor you carefully for complications. HOME CARE INSTRUCTIONS  No special instructions are needed after a transfusion. You may find your energy is better. Speak with your caregiver about any  limitations on activity for underlying diseases you may have. SEEK MEDICAL CARE IF:   Your condition is not improving after your transfusion.  You develop redness or irritation at the intravenous (IV) site. SEEK IMMEDIATE MEDICAL CARE IF:  Any of the following symptoms occur over the next 12 hours:  Shaking chills.  You have a temperature by mouth above 102 F (38.9 C), not controlled by medicine.  Chest, back, or muscle pain.  People around you feel you are not acting correctly or are confused.  Shortness of breath or difficulty breathing.  Dizziness and fainting.  You get a rash or develop hives.  You have a decrease in urine output.  Your urine turns a dark color or changes to pink, red, or brown. Any of the following symptoms occur over the next 10 days:  You have a temperature by mouth above 102 F (38.9 C), not controlled by medicine.  Shortness of breath.  Weakness after normal activity.  The white part of the eye turns yellow (jaundice).  You have a decrease in the amount of urine or are urinating less often.  Your urine turns a dark color or changes to pink, red, or brown. Document Released: 09/20/2000 Document Revised: 12/16/2011 Document Reviewed: 05/09/2008 [facility] Patient Information 2014 [place], [place].  _______________________________________________________________________

## 2016-05-30 ENCOUNTER — Inpatient Hospital Stay (HOSPITAL_COMMUNITY): Payer: Managed Care, Other (non HMO) | Admitting: Certified Registered Nurse Anesthetist

## 2016-05-30 ENCOUNTER — Inpatient Hospital Stay (HOSPITAL_COMMUNITY): Payer: Managed Care, Other (non HMO)

## 2016-05-30 ENCOUNTER — Encounter (HOSPITAL_COMMUNITY)
Admission: RE | Disposition: A | Discharge status: Skilled Nursing Facility | Payer: Self-pay | Source: Ambulatory Visit | Attending: Specialist

## 2016-05-30 ENCOUNTER — Inpatient Hospital Stay (HOSPITAL_COMMUNITY)
Admission: RE | Admit: 2016-05-30 | Discharge: 2016-06-01 | DRG: 470 | Disposition: A | Discharge status: Skilled Nursing Facility | Payer: Managed Care, Other (non HMO) | Source: Ambulatory Visit | Attending: Specialist | Admitting: Specialist

## 2016-05-30 ENCOUNTER — Encounter (HOSPITAL_COMMUNITY): Payer: Self-pay | Admitting: *Deleted

## 2016-05-30 DIAGNOSIS — J449 Chronic obstructive pulmonary disease, unspecified: Secondary | ICD-10-CM | POA: Diagnosis present

## 2016-05-30 DIAGNOSIS — M1711 Unilateral primary osteoarthritis, right knee: Principal | ICD-10-CM | POA: Diagnosis present

## 2016-05-30 DIAGNOSIS — K219 Gastro-esophageal reflux disease without esophagitis: Secondary | ICD-10-CM | POA: Diagnosis present

## 2016-05-30 DIAGNOSIS — Z79899 Other long term (current) drug therapy: Secondary | ICD-10-CM

## 2016-05-30 DIAGNOSIS — M25761 Osteophyte, right knee: Secondary | ICD-10-CM | POA: Diagnosis present

## 2016-05-30 DIAGNOSIS — I10 Essential (primary) hypertension: Secondary | ICD-10-CM | POA: Diagnosis present

## 2016-05-30 DIAGNOSIS — Z7722 Contact with and (suspected) exposure to environmental tobacco smoke (acute) (chronic): Secondary | ICD-10-CM | POA: Diagnosis present

## 2016-05-30 DIAGNOSIS — Z7951 Long term (current) use of inhaled steroids: Secondary | ICD-10-CM | POA: Diagnosis not present

## 2016-05-30 DIAGNOSIS — Z96659 Presence of unspecified artificial knee joint: Secondary | ICD-10-CM

## 2016-05-30 DIAGNOSIS — M25561 Pain in right knee: Secondary | ICD-10-CM | POA: Diagnosis present

## 2016-05-30 HISTORY — DX: Dorsalgia, unspecified: M54.9

## 2016-05-30 HISTORY — PX: TOTAL KNEE ARTHROPLASTY: SHX125

## 2016-05-30 LAB — TYPE AND SCREEN
ABO/RH(D): A POS
ANTIBODY SCREEN: NEGATIVE

## 2016-05-30 SURGERY — ARTHROPLASTY, KNEE, TOTAL
Anesthesia: Regional | Site: Knee | Laterality: Right

## 2016-05-30 MED ORDER — STERILE WATER FOR IRRIGATION IR SOLN
Status: DC | PRN
  Administered 2016-05-30: 3000 mL

## 2016-05-30 MED ORDER — FENTANYL CITRATE (PF) 100 MCG/2ML IJ SOLN
INTRAMUSCULAR | Status: DC | PRN
  Administered 2016-05-30: 50 ug via INTRAVENOUS

## 2016-05-30 MED ORDER — ALBUTEROL SULFATE HFA 108 (90 BASE) MCG/ACT IN AERS: 2.0000 | INHALATION_SPRAY | Freq: Four times a day (QID) | RESPIRATORY_TRACT | Status: DC | PRN

## 2016-05-30 MED ORDER — BUPIVACAINE-EPINEPHRINE 0.25% -1:200000 IJ SOLN
INTRAMUSCULAR | Status: DC | PRN
  Administered 2016-05-30: 35 mL

## 2016-05-30 MED ORDER — RISAQUAD PO CAPS
1.0000 | ORAL_CAPSULE | Freq: Every day | ORAL | Status: DC
  Administered 2016-06-01: 1 via ORAL
  Filled 2016-05-30: qty 1

## 2016-05-30 MED ORDER — SODIUM CHLORIDE 0.9 % IR SOLN
Status: DC | PRN
  Administered 2016-05-30: 500 mL

## 2016-05-30 MED ORDER — BUPIVACAINE HCL (PF) 0.75 % IJ SOLN
INTRAMUSCULAR | Status: DC | PRN
  Administered 2016-05-30: 2 mL via INTRATHECAL

## 2016-05-30 MED ORDER — OXYCODONE HCL 5 MG PO TABS: 5.0000 mg | ORAL_TABLET | Freq: Once | ORAL | Status: DC | PRN

## 2016-05-30 MED ORDER — PROPOFOL 500 MG/50ML IV EMUL
INTRAVENOUS | Status: DC | PRN
  Administered 2016-05-30: 100 ug/kg/min via INTRAVENOUS

## 2016-05-30 MED ORDER — BUPIVACAINE-EPINEPHRINE (PF) 0.25% -1:200000 IJ SOLN
INTRAMUSCULAR | Status: AC
  Filled 2016-05-30: qty 60

## 2016-05-30 MED ORDER — SODIUM CHLORIDE 0.9 % IJ SOLN
INTRAMUSCULAR | Status: AC
  Filled 2016-05-30: qty 10

## 2016-05-30 MED ORDER — MONTELUKAST SODIUM 10 MG PO TABS
10.0000 mg | ORAL_TABLET | Freq: Every day | ORAL | Status: DC
  Administered 2016-05-30 – 2016-05-31 (×2): 10 mg via ORAL
  Filled 2016-05-30 (×2): qty 1

## 2016-05-30 MED ORDER — CEFAZOLIN SODIUM-DEXTROSE 2-4 GM/100ML-% IV SOLN
2.0000 g | INTRAVENOUS | Status: AC
  Administered 2016-05-30: 2 g via INTRAVENOUS

## 2016-05-30 MED ORDER — CEFAZOLIN SODIUM-DEXTROSE 2-4 GM/100ML-% IV SOLN
INTRAVENOUS | Status: AC
  Filled 2016-05-30: qty 100

## 2016-05-30 MED ORDER — ALBUTEROL SULFATE (2.5 MG/3ML) 0.083% IN NEBU: 2.5000 mg | INHALATION_SOLUTION | Freq: Four times a day (QID) | RESPIRATORY_TRACT | Status: DC | PRN

## 2016-05-30 MED ORDER — POLYETHYLENE GLYCOL 3350 17 G PO PACK: 17.0000 g | PACK | Freq: Every day | ORAL | Status: DC | PRN

## 2016-05-30 MED ORDER — RIVAROXABAN 10 MG PO TABS: 10.0000 mg | ORAL_TABLET | Freq: Every day | ORAL | 0 refills | Status: DC

## 2016-05-30 MED ORDER — PANTOPRAZOLE SODIUM 40 MG PO TBEC
40.0000 mg | DELAYED_RELEASE_TABLET | Freq: Every day | ORAL | Status: DC
  Administered 2016-05-31 – 2016-06-01 (×2): 40 mg via ORAL
  Filled 2016-05-30 (×2): qty 1

## 2016-05-30 MED ORDER — PROMETHAZINE HCL 25 MG/ML IJ SOLN: 6.2500 mg | INTRAMUSCULAR | Status: DC | PRN

## 2016-05-30 MED ORDER — CEFAZOLIN SODIUM-DEXTROSE 2-4 GM/100ML-% IV SOLN
2.0000 g | Freq: Four times a day (QID) | INTRAVENOUS | Status: AC
  Administered 2016-05-30 – 2016-05-31 (×3): 2 g via INTRAVENOUS
  Filled 2016-05-30 (×3): qty 100

## 2016-05-30 MED ORDER — DIPHENHYDRAMINE HCL 12.5 MG/5ML PO ELIX: 12.5000 mg | ORAL_SOLUTION | ORAL | Status: DC | PRN

## 2016-05-30 MED ORDER — MAGNESIUM CITRATE PO SOLN: 1.0000 | Freq: Once | ORAL | Status: DC | PRN

## 2016-05-30 MED ORDER — POLYETHYLENE GLYCOL 3350 17 G PO PACK: 17.0000 g | PACK | Freq: Every day | ORAL | 0 refills | Status: DC

## 2016-05-30 MED ORDER — OXYCODONE HCL 5 MG PO TABS
5.0000 mg | ORAL_TABLET | ORAL | Status: DC | PRN
  Administered 2016-05-30 (×2): 10 mg via ORAL
  Administered 2016-05-30 (×2): 5 mg via ORAL
  Administered 2016-05-30 – 2016-05-31 (×4): 10 mg via ORAL
  Filled 2016-05-30 (×7): qty 2
  Filled 2016-05-30 (×2): qty 1

## 2016-05-30 MED ORDER — PROPOFOL 10 MG/ML IV BOLUS
INTRAVENOUS | Status: AC
  Filled 2016-05-30: qty 20

## 2016-05-30 MED ORDER — FENTANYL CITRATE (PF) 100 MCG/2ML IJ SOLN
INTRAMUSCULAR | Status: AC
  Filled 2016-05-30: qty 2

## 2016-05-30 MED ORDER — RIVAROXABAN 10 MG PO TABS
10.0000 mg | ORAL_TABLET | Freq: Every day | ORAL | Status: DC
  Administered 2016-05-31 – 2016-06-01 (×2): 10 mg via ORAL
  Filled 2016-05-30 (×2): qty 1

## 2016-05-30 MED ORDER — MOMETASONE FURO-FORMOTEROL FUM 200-5 MCG/ACT IN AERO
2.0000 | INHALATION_SPRAY | Freq: Two times a day (BID) | RESPIRATORY_TRACT | Status: DC
  Filled 2016-05-30: qty 8.8

## 2016-05-30 MED ORDER — SODIUM CHLORIDE 0.9 % IR SOLN
Status: AC
  Filled 2016-05-30: qty 1

## 2016-05-30 MED ORDER — ROPIVACAINE HCL 5 MG/ML IJ SOLN
INTRAMUSCULAR | Status: DC | PRN
  Administered 2016-05-30: 20 mL via PERINEURAL

## 2016-05-30 MED ORDER — BISACODYL 5 MG PO TBEC: 5.0000 mg | DELAYED_RELEASE_TABLET | Freq: Every day | ORAL | Status: DC | PRN

## 2016-05-30 MED ORDER — ACETAMINOPHEN 650 MG RE SUPP: 650.0000 mg | Freq: Four times a day (QID) | RECTAL | Status: DC | PRN

## 2016-05-30 MED ORDER — MIDAZOLAM HCL 2 MG/2ML IJ SOLN
INTRAMUSCULAR | Status: AC
  Filled 2016-05-30: qty 2

## 2016-05-30 MED ORDER — DOCUSATE SODIUM 100 MG PO CAPS
100.0000 mg | ORAL_CAPSULE | Freq: Two times a day (BID) | ORAL | Status: DC
  Administered 2016-05-30 – 2016-06-01 (×4): 100 mg via ORAL
  Filled 2016-05-30 (×4): qty 1

## 2016-05-30 MED ORDER — BUPIVACAINE-EPINEPHRINE (PF) 0.25% -1:200000 IJ SOLN
INTRAMUSCULAR | Status: AC
  Filled 2016-05-30: qty 30

## 2016-05-30 MED ORDER — PROPOFOL 10 MG/ML IV BOLUS
INTRAVENOUS | Status: AC
  Filled 2016-05-30: qty 40

## 2016-05-30 MED ORDER — SPIRONOLACTONE 25 MG PO TABS
25.0000 mg | ORAL_TABLET | Freq: Every day | ORAL | Status: DC
  Administered 2016-05-30 – 2016-06-01 (×3): 25 mg via ORAL
  Filled 2016-05-30 (×3): qty 1

## 2016-05-30 MED ORDER — MIDAZOLAM HCL 5 MG/5ML IJ SOLN
INTRAMUSCULAR | Status: DC | PRN
  Administered 2016-05-30 (×2): 1 mg via INTRAVENOUS
  Administered 2016-05-30: 2 mg via INTRAVENOUS

## 2016-05-30 MED ORDER — ROPIVACAINE HCL 5 MG/ML IJ SOLN
INTRAMUSCULAR | Status: AC
  Filled 2016-05-30: qty 30

## 2016-05-30 MED ORDER — TRANEXAMIC ACID 1000 MG/10ML IV SOLN
1000.0000 mg | INTRAVENOUS | Status: AC
  Administered 2016-05-30: 1000 mg via INTRAVENOUS
  Filled 2016-05-30: qty 1100

## 2016-05-30 MED ORDER — KETOCONAZOLE 2 % EX SHAM
MEDICATED_SHAMPOO | Freq: Once | CUTANEOUS | Status: DC
  Filled 2016-05-30: qty 120

## 2016-05-30 MED ORDER — PHENYLEPHRINE HCL 10 MG/ML IJ SOLN
INTRAVENOUS | Status: DC | PRN
  Administered 2016-05-30: 10 ug/min via INTRAVENOUS

## 2016-05-30 MED ORDER — DEXAMETHASONE SODIUM PHOSPHATE 10 MG/ML IJ SOLN
INTRAMUSCULAR | Status: AC
  Filled 2016-05-30: qty 1

## 2016-05-30 MED ORDER — METOCLOPRAMIDE HCL 5 MG PO TABS: 5.0000 mg | ORAL_TABLET | Freq: Three times a day (TID) | ORAL | Status: DC | PRN

## 2016-05-30 MED ORDER — HYDROMORPHONE HCL 1 MG/ML IJ SOLN
0.5000 mg | INTRAMUSCULAR | Status: DC | PRN
  Administered 2016-05-30 – 2016-06-01 (×5): 1 mg via INTRAVENOUS
  Filled 2016-05-30 (×5): qty 1

## 2016-05-30 MED ORDER — HYDROMORPHONE HCL 1 MG/ML IJ SOLN: 0.2500 mg | INTRAMUSCULAR | Status: DC | PRN

## 2016-05-30 MED ORDER — OXYCODONE HCL 5 MG/5ML PO SOLN: 5.0000 mg | Freq: Once | ORAL | Status: DC | PRN

## 2016-05-30 MED ORDER — MENTHOL 3 MG MT LOZG: 1.0000 | LOZENGE | OROMUCOSAL | Status: DC | PRN

## 2016-05-30 MED ORDER — ONDANSETRON HCL 4 MG PO TABS
4.0000 mg | ORAL_TABLET | Freq: Four times a day (QID) | ORAL | Status: DC | PRN
  Administered 2016-05-30 – 2016-06-01 (×3): 4 mg via ORAL
  Filled 2016-05-30 (×3): qty 1

## 2016-05-30 MED ORDER — ONDANSETRON HCL 4 MG/2ML IJ SOLN: 4.0000 mg | Freq: Four times a day (QID) | INTRAMUSCULAR | Status: DC | PRN

## 2016-05-30 MED ORDER — PHENOL 1.4 % MT LIQD
1.0000 | OROMUCOSAL | Status: DC | PRN
  Administered 2016-05-30: 1 via OROMUCOSAL
  Filled 2016-05-30: qty 177

## 2016-05-30 MED ORDER — METHOCARBAMOL 500 MG PO TABS: 500.0000 mg | ORAL_TABLET | Freq: Four times a day (QID) | ORAL | 1 refills | Status: DC | PRN

## 2016-05-30 MED ORDER — SODIUM CHLORIDE 0.9 % IR SOLN
Status: DC | PRN
  Administered 2016-05-30: 3000 mL

## 2016-05-30 MED ORDER — KCL IN DEXTROSE-NACL 20-5-0.45 MEQ/L-%-% IV SOLN
INTRAVENOUS | Status: AC
  Administered 2016-05-30: 50 mL/h via INTRAVENOUS
  Filled 2016-05-30 (×2): qty 1000

## 2016-05-30 MED ORDER — LACTATED RINGERS IV SOLN: INTRAVENOUS | Status: DC

## 2016-05-30 MED ORDER — DOCUSATE SODIUM 100 MG PO CAPS: 100.0000 mg | ORAL_CAPSULE | Freq: Two times a day (BID) | ORAL | 1 refills | Status: DC | PRN

## 2016-05-30 MED ORDER — METHOCARBAMOL 500 MG PO TABS
500.0000 mg | ORAL_TABLET | Freq: Four times a day (QID) | ORAL | Status: DC | PRN
  Administered 2016-05-30 – 2016-06-01 (×5): 500 mg via ORAL
  Filled 2016-05-30 (×5): qty 1

## 2016-05-30 MED ORDER — METOCLOPRAMIDE HCL 5 MG/ML IJ SOLN: 5.0000 mg | Freq: Three times a day (TID) | INTRAMUSCULAR | Status: DC | PRN

## 2016-05-30 MED ORDER — ALUM & MAG HYDROXIDE-SIMETH 200-200-20 MG/5ML PO SUSP
30.0000 mL | ORAL | Status: DC | PRN
  Administered 2016-05-31 – 2016-06-01 (×3): 30 mL via ORAL
  Filled 2016-05-30 (×3): qty 30

## 2016-05-30 MED ORDER — EPHEDRINE SULFATE 50 MG/ML IJ SOLN
INTRAMUSCULAR | Status: AC
Start: 2016-05-30 — End: 2016-05-30
  Filled 2016-05-30: qty 1

## 2016-05-30 MED ORDER — METHOCARBAMOL 1000 MG/10ML IJ SOLN
500.0000 mg | Freq: Four times a day (QID) | INTRAVENOUS | Status: DC | PRN
  Administered 2016-05-30: 500 mg via INTRAVENOUS
  Filled 2016-05-30: qty 5
  Filled 2016-05-30: qty 550

## 2016-05-30 MED ORDER — AMLODIPINE BESYLATE 10 MG PO TABS
10.0000 mg | ORAL_TABLET | Freq: Every day | ORAL | Status: DC
  Administered 2016-05-31 – 2016-06-01 (×2): 10 mg via ORAL
  Filled 2016-05-30 (×2): qty 1

## 2016-05-30 MED ORDER — LIDOCAINE HCL (CARDIAC) 20 MG/ML IV SOLN
INTRAVENOUS | Status: AC
Start: 2016-05-30 — End: 2016-05-30
  Filled 2016-05-30: qty 5

## 2016-05-30 MED ORDER — ACETAMINOPHEN 325 MG PO TABS
650.0000 mg | ORAL_TABLET | Freq: Four times a day (QID) | ORAL | Status: DC | PRN
  Administered 2016-05-31 (×3): 650 mg via ORAL
  Filled 2016-05-30 (×3): qty 2

## 2016-05-30 MED ORDER — ONDANSETRON HCL 4 MG/2ML IJ SOLN
INTRAMUSCULAR | Status: DC | PRN
  Administered 2016-05-30: 4 mg via INTRAVENOUS

## 2016-05-30 MED ORDER — PHENYLEPHRINE HCL 10 MG/ML IJ SOLN
INTRAMUSCULAR | Status: AC
  Filled 2016-05-30: qty 1

## 2016-05-30 MED ORDER — ONDANSETRON HCL 4 MG/2ML IJ SOLN
INTRAMUSCULAR | Status: AC
  Filled 2016-05-30: qty 2

## 2016-05-30 MED ORDER — OXYCODONE-ACETAMINOPHEN 10-325 MG PO TABS: 1.0000 | ORAL_TABLET | ORAL | 0 refills | Status: DC | PRN

## 2016-05-30 MED ORDER — LACTATED RINGERS IV SOLN
INTRAVENOUS | Status: DC | PRN
  Administered 2016-05-30 (×2): via INTRAVENOUS

## 2016-05-30 SURGICAL SUPPLY — 66 items
AGENT HMST SPONGE THK3/8 (HEMOSTASIS)
BAG SPEC THK2 15X12 ZIP CLS (MISCELLANEOUS)
BAG ZIPLOCK 12X15 (MISCELLANEOUS) IMPLANT
BANDAGE ACE 4X5 VEL STRL LF (GAUZE/BANDAGES/DRESSINGS) ×3 IMPLANT
BANDAGE ACE 6X5 VEL STRL LF (GAUZE/BANDAGES/DRESSINGS) ×3 IMPLANT
BLADE SAG 18X100X1.27 (BLADE) ×3 IMPLANT
BLADE SAW SGTL 13.0X1.19X90.0M (BLADE) ×3 IMPLANT
BLADE SAW SGTL 18X1.27X75 (BLADE) ×1 IMPLANT
BLADE SAW SGTL 18X1.27X75MM (BLADE) ×1
BOWL SMART MIX CTS (DISPOSABLE) IMPLANT
CAPT KNEE TOTAL 3 ATTUNE ×2 IMPLANT
CEMENT HV SMART SET (Cement) ×6 IMPLANT
CLOSURE WOUND 1/2 X4 (GAUZE/BANDAGES/DRESSINGS)
CLOTH 2% CHLOROHEXIDINE 3PK (PERSONAL CARE ITEMS) ×3 IMPLANT
CUFF TOURN SGL QUICK 34 (TOURNIQUET CUFF) ×3
CUFF TRNQT CYL 34X4X40X1 (TOURNIQUET CUFF) ×1 IMPLANT
DECANTER SPIKE VIAL GLASS SM (MISCELLANEOUS) ×3 IMPLANT
DRAPE INCISE IOBAN 66X45 STRL (DRAPES) IMPLANT
DRAPE ORTHO SPLIT 77X108 STRL (DRAPES) ×6
DRAPE SHEET LG 3/4 BI-LAMINATE (DRAPES) ×3 IMPLANT
DRAPE SURG ORHT 6 SPLT 77X108 (DRAPES) ×2 IMPLANT
DRAPE U-SHAPE 47X51 STRL (DRAPES) ×3 IMPLANT
DRSG AQUACEL AG ADV 3.5X10 (GAUZE/BANDAGES/DRESSINGS) ×2 IMPLANT
DRSG TEGADERM 4X4.75 (GAUZE/BANDAGES/DRESSINGS) IMPLANT
DURAPREP 26ML APPLICATOR (WOUND CARE) ×3 IMPLANT
ELECT REM PT RETURN 9FT ADLT (ELECTROSURGICAL) ×3
ELECTRODE REM PT RTRN 9FT ADLT (ELECTROSURGICAL) ×1 IMPLANT
EVACUATOR 1/8 PVC DRAIN (DRAIN) IMPLANT
GAUZE SPONGE 2X2 8PLY STRL LF (GAUZE/BANDAGES/DRESSINGS) IMPLANT
GLOVE BIOGEL PI IND STRL 7.0 (GLOVE) ×1 IMPLANT
GLOVE BIOGEL PI IND STRL 8 (GLOVE) ×1 IMPLANT
GLOVE BIOGEL PI INDICATOR 7.0 (GLOVE) ×14
GLOVE BIOGEL PI INDICATOR 8 (GLOVE) ×2
GLOVE SURG SS PI 7.0 STRL IVOR (GLOVE) ×3 IMPLANT
GLOVE SURG SS PI 7.5 STRL IVOR (GLOVE) ×3 IMPLANT
GLOVE SURG SS PI 8.0 STRL IVOR (GLOVE) ×6 IMPLANT
GOWN SPEC L3 XXLG W/TWL (GOWN DISPOSABLE) ×2 IMPLANT
GOWN STRL REUS W/TWL XL LVL3 (GOWN DISPOSABLE) ×10 IMPLANT
HANDPIECE INTERPULSE COAX TIP (DISPOSABLE) ×3
HEMOSTAT SPONGE AVITENE ULTRA (HEMOSTASIS) ×1 IMPLANT
IMMOBILIZER KNEE 20 (SOFTGOODS) ×3
IMMOBILIZER KNEE 20 THIGH 36 (SOFTGOODS) ×1 IMPLANT
MANIFOLD NEPTUNE II (INSTRUMENTS) ×3 IMPLANT
NS IRRIG 1000ML POUR BTL (IV SOLUTION) IMPLANT
PACK TOTAL KNEE CUSTOM (KITS) ×3 IMPLANT
POSITIONER SURGICAL ARM (MISCELLANEOUS) ×3 IMPLANT
SET HNDPC FAN SPRY TIP SCT (DISPOSABLE) ×1 IMPLANT
SPONGE GAUZE 2X2 STER 10/PKG (GAUZE/BANDAGES/DRESSINGS)
SPONGE SURGIFOAM ABS GEL 100 (HEMOSTASIS) IMPLANT
STAPLER VISISTAT (STAPLE) ×2 IMPLANT
STRIP CLOSURE SKIN 1/2X4 (GAUZE/BANDAGES/DRESSINGS) IMPLANT
SUT BONE WAX W31G (SUTURE) ×2 IMPLANT
SUT MNCRL AB 4-0 PS2 18 (SUTURE) IMPLANT
SUT STRATAFIX 0 PDS 27 VIOLET (SUTURE) ×3
SUT VIC AB 1 CT1 27 (SUTURE) ×6
SUT VIC AB 1 CT1 27XBRD ANTBC (SUTURE) ×2 IMPLANT
SUT VIC AB 2-0 CT1 27 (SUTURE) ×9
SUT VIC AB 2-0 CT1 TAPERPNT 27 (SUTURE) ×3 IMPLANT
SUTURE STRATFX 0 PDS 27 VIOLET (SUTURE) ×1 IMPLANT
SYR 50ML LL SCALE MARK (SYRINGE) ×1 IMPLANT
TOWER CARTRIDGE SMART MIX (DISPOSABLE) ×3 IMPLANT
TRAY FOLEY W/METER SILVER 14FR (SET/KITS/TRAYS/PACK) ×1 IMPLANT
TRAY FOLEY W/METER SILVER 16FR (SET/KITS/TRAYS/PACK) ×3 IMPLANT
WATER STERILE IRR 1500ML POUR (IV SOLUTION) ×3 IMPLANT
WRAP KNEE MAXI GEL POST OP (GAUZE/BANDAGES/DRESSINGS) ×3 IMPLANT
YANKAUER SUCT BULB TIP 10FT TU (MISCELLANEOUS) ×3 IMPLANT

## 2016-05-30 NOTE — Anesthesia Preprocedure Evaluation (Addendum)
Anesthesia Evaluation  Patient identified by MRN, date of birth, ID band Patient awake    Reviewed: Allergy & Precautions, H&P , NPO status , Patient's Chart, lab work & pertinent test results  Airway Mallampati: II  TM Distance: >3 FB Neck ROM: Full    Dental no notable dental hx.    Pulmonary asthma , COPD,  COPD inhaler, former smoker,    Pulmonary exam normal breath sounds clear to auscultation       Cardiovascular Exercise Tolerance: Good hypertension, Pt. on medications Normal cardiovascular exam Rhythm:Regular Rate:Normal     Neuro/Psych negative neurological ROS  negative psych ROS   GI/Hepatic Neg liver ROS, GERD  Medicated,  Endo/Other  negative endocrine ROS  Renal/GU negative Renal ROS  negative genitourinary   Musculoskeletal  (+) Arthritis ,   Abdominal   Peds negative pediatric ROS (+)  Hematology negative hematology ROS (+)   Anesthesia Other Findings   Reproductive/Obstetrics negative OB ROS                             Anesthesia Physical  Anesthesia Plan  ASA: II  Anesthesia Plan: Spinal and Regional   Post-op Pain Management:    Induction: Intravenous  Airway Management Planned: Simple Face Mask  Additional Equipment:   Intra-op Plan:   Post-operative Plan: Extubation in OR  Informed Consent: I have reviewed the patients History and Physical, chart, labs and discussed the procedure including the risks, benefits and alternatives for the proposed anesthesia with the patient or authorized representative who has indicated his/her understanding and acceptance.   Dental advisory given  Plan Discussed with: CRNA  Anesthesia Plan Comments: (Desires spinal this time given his understanding of risk/benefit profile of procedure, no further questions or concerns, denies blood thinning meds, never had back surgery)       Anesthesia Quick Evaluation

## 2016-05-30 NOTE — Progress Notes (Signed)
Portable AP and Lateral Right Knee X-rays done. 

## 2016-05-30 NOTE — Progress Notes (Signed)
X-ray results noted 

## 2016-05-30 NOTE — Anesthesia Procedure Notes (Signed)
Anesthesia Regional Block:  Adductor canal block  Pre-Anesthetic Checklist: ,, timeout performed, Correct Patient, Correct Site, Correct Laterality, Correct Procedure, Correct Position, site marked, Risks and benefits discussed,  Surgical consent,  Pre-op evaluation,  At surgeon's request and post-op pain management  Laterality: Right  Prep: chloraprep       Needles:  Injection technique: Single-shot  Needle Type: Stimiplex     Needle Length: 9cm 9 cm Needle Gauge: 21 G    Additional Needles:  Procedures: ultrasound guided (picture in chart) Adductor canal block Narrative:  Injection made incrementally with aspirations every 5 mL.  Performed by: Personally  Anesthesiologist: Dasean Brow  Additional Notes: Risks, benefits and alternative to block explained extensively.  Patient tolerated procedure well, without complications.

## 2016-05-30 NOTE — Op Note (Signed)
NAME:  QUANTEZ, MAZANEC NO.:  [id number]  MEDICAL RECORD NO.:  [id number]  LOCATION:  [id number]                         FACILITY:  [facility]  PHYSICIAN:  Susa Day, M.D.    DATE OF BIRTH:  [birth year]  DATE OF PROCEDURE:  05/30/2016 DATE OF DISCHARGE:                              OPERATIVE REPORT   PREOPERATIVE DIAGNOSIS:  End-stage osteoarthrosis of the right knee.  POSTOPERATIVE DIAGNOSIS:  End-stage osteoarthrosis of the right knee.  PROCEDURE PERFORMED:  Right total knee arthroplasty.  COMPONENTS:  DePuy rotating platform, 8 femur, 9 tibia, 6 insert, 41 patella.  ANESTHESIA:  Spinal.  ASSISTANT:  Cleophas Dunker, PA.  HISTORY:  A 65, bone-on-bone varus deformity, flexion contracture refractory to conservative treatment with a negative effect on his activities of daily living, severe pain with ambulation and tried arthroscopy, cortisone, viscosupplementation, Elavil, bone-on-bone patellofemoral medial compartment was indicated for placement of the degenerated joint.  Risks and benefits were discussed including bleeding, infection, damage to the neurovascular structures, suboptimal range of motion, DVT, PE, anesthetic complications, etc.  TECHNIQUE:  With the patient in supine position, after the induction of adequate general anesthesia and 3 g of Kefzol, the right lower extremity was prepped and draped in usual sterile fashion.  Exsanguinated the leg, thigh tourniquet was inflated to 275 mmHg.  Midline incision was made over the knee.  Subcutaneous tissue was dissected.  Full-thickness flaps developed.  He had significant prepatellar bursal scar tissue.  Median parapatellar arthrotomy performed.  Patella everted.  He had significant scarring of the infrapatellar fat pad.  Patella everted and knee flexed, copious portions of clear synovial fluid was evacuated.  We performed a synovectomy, removed osteophytes with a rongeur, bone-on-bone on all compartments  particularly medially.  I elevated the soft tissue medially, preserving the MCL.  Removed osteophytes with a Beyer rongeur, flexed the knee, everted the patella preserving its attachment.  A step drill utilized and the femoral canal was irrigated.  We used a 5-degree right with a flexion contracture, 10 off the distal femur.  This was pinned and we performed the femoral cut.  A very eburnated and sclerotic bone.  We did increase the time to do this.  We irrigated it while we were performing these cuts.  Following this, we gently subluxed the tibia with Mikhails and retractors.  With an external alignment guide, we noted the defect posteromedially.  Two of the defect was significantly was more than 10 off the non-defect laterally the normal size.  Therefore, took 10 off the defect, pinned it and 3 degrees of slope bisecting the tibiotalar joint parallel to the tibia.  We performed the cut with an oscillating saw.  Again, hard, eburnated bone was encountered, it was irrigated.  Following this cut to near the bottom of the eburnated bone medially, the extension gap was too tight. We then replaced our block 1 or 2 mm, re-did our tibial cut just to the undersurface of the low point medially.  I then drill holes and then the eburnated bone measuring 2 x 1.5 cm medially.  We then checked our extension gap and it was straight at 6 insert.  Next, we flexed  the knee, sized the distal femur off the anterior cortex to an 8.  This was then pinned and we used our block guide and performed our anterior, posterior and chamfer cuts.  Again, eburnated bone irrigated, no notching of the femur.  We then performed our box cut.  Placed a trial femur and drilled the lug holes, it fit satisfactorily.  Just prior to that, we subluxed and completed the tibia, it was actually measured at 8 and then a 9 was better.  Maximizing the coverage of the tibia, pinned, drilled it centrally.  Entered fin guide.  Trial tibia,  we placed our trial femur, reduced it with a 6-mm insert.  We had full extension, full flexion, good stability, varus and valgus stressing at 0 and 30 degrees. Following this, we then everted the patella, measured at a 31, we planed it to a 20, measured at a 41, medializing our peg holes and drilled those, placed a trial 41 patella, reduced it, and had excellent patellofemoral tracking.  We removed all instrumentation and checked posteriorly.  There were residual loose bodies and osteophytes meticulously removed, remnants of the menisci were removed, cauterized the geniculates.  Copiously irrigated the wound with antibiotic irrigation.  Flexed the knee and subluxed the tibia, dried all surfaces thoroughly and mixed the cement on back table in appropriate fashion, injected it into the tibial canal, digitally pressurizing the canal.  We impacted the 9 tibial tray, redundant cement removed.  We cemented and impacted the 9, the 8 femur, redundant cement removed.  Placed a trial 6 insert, reduced it and held an axial load throughout the curing of the cement, cemented and clamped the patella.  Following this, we placed bone wax on the cancellous surfaces and removed any exposed bone spurs, infiltrated the periarticular tissues with Marcaine with epinephrine. Irrigated with antibiotic irrigation.  After full curing of the cement, we had full extension, full flexion and excellent patellofemoral tracking.  We then flexed the knee, removed the trial insert, meticulously removed all redundant cement, copiously irrigated the wound with antibiotic irrigation.  Subluxed the tibia.  Placed a permanent 6- mm insert, reduced it and had full extension, full flexion, good stability, varus and valgus stressing at 0-30 degrees, negative anterior drawer, excellent patellofemoral tracking.  Again, copiously irrigated the wound.  We reapproximated the patellar arthrotomy with 1 Vicryl. Had a STRATAFIX; however,  they had a 0, not a 1.  We supplemented that with a V-Loc and again flexion to gravity at 90 degrees and excellent patellofemoral tracking, negative anterior drawer.  Again copious irrigation, subcu with 2-0 and skin with staples, again flexion to gravity and excellent patellofemoral tracking and stability following the closure.  Again, sterile dressing applied, placed an immobilizer.  Tourniquet was deflated right after the cement was placed at 80 minutes.  No active bleeding was noted or at the end.  We therefore did not place a drain.  He tolerated the procedure well. There were no complications.  Again, assistant, Cleophas Dunker, PA was used throughout the case for closure, exposure, positioning and holding.  Blood loss minimal.     Susa Day, M.D.     Geralynn Rile  D:  05/30/2016  T:  05/30/2016  Job:  [id number]

## 2016-05-30 NOTE — Anesthesia Procedure Notes (Addendum)
Spinal  Patient location during procedure: OR Staffing Resident/CRNA: Tanayah Squitieri G Performed: resident/CRNA  Preanesthetic Checklist Completed: patient identified, site marked, surgical consent, pre-op evaluation, timeout performed, IV checked, risks and benefits discussed and monitors and equipment checked Spinal Block Patient position: sitting Prep: ChloraPrep Patient monitoring: heart rate, cardiac monitor, continuous pulse ox and blood pressure Approach: midline Location: L3-4 Injection technique: single-shot Needle Needle type: Pencan  Needle gauge: 24 G

## 2016-05-30 NOTE — NC FL2 (Signed)
[place] LEVEL OF CARE SCREENING TOOL     IDENTIFICATION  Patient Name: Charles Mccoy Birthdate: Feb 06, 1951 Sex: male Admission Date (Current Location): 05/30/2016  [facility] and [place] Number:  Herbalist and Address:  [facility],  [place], [place]      Provider Number: [id number]  Attending Physician Name and Address:  Susa Day, MD  Relative Name and Phone Number:       Current Level of Care: Hospital Recommended Level of Care: [place] Prior Approval Number:    Date Approved/Denied:   PASRR Number: [id number]  Discharge Plan: SNF    Current Diagnoses: Patient Active Problem List   Diagnosis Date Noted  . Primary osteoarthritis of right knee 05/30/2016  . Right knee DJD 05/30/2016  . GERD (gastroesophageal reflux disease) 07/28/2014  . Left knee DJD 07/21/2014  . Osteoarthritis 10/09/2009  . Asthma 08/17/2008  . SINUSITIS 09/15/2007  . Essential hypertension 06/19/2007  . Allergic rhinitis 06/19/2007    Orientation RESPIRATION BLADDER Height & Weight     Self, Time, Situation, Place  O2 Indwelling catheter Weight: 241 lb (109.3 kg) Height:  6\' 4"  (193 cm)  BEHAVIORAL SYMPTOMS/MOOD NEUROLOGICAL BOWEL NUTRITION STATUS  Other (Comment) (no behaviors)   Continent Diet  AMBULATORY STATUS COMMUNICATION OF NEEDS Skin   Extensive Assist Verbally Surgical wounds                       Personal Care Assistance Level of Assistance  Bathing, Feeding, Dressing Bathing Assistance: Limited assistance Feeding assistance: Independent Dressing Assistance: Limited assistance     Functional Limitations Info  Sight, Speech, Hearing Sight Info: Adequate Hearing Info: Adequate Speech Info: Adequate    SPECIAL CARE FACTORS FREQUENCY  PT (By licensed PT), OT (By licensed OT)     PT Frequency: 5 x wk OT Frequency: 5 x wk            Contractures Contractures Info: Not present     Additional Factors Info  Code Status Code Status Info: Full Code             Current Medications (05/30/2016):  This is the current hospital active medication list Current Facility-Administered Medications  Medication Dose Route Frequency Provider Last Rate Last Dose  . acetaminophen (TYLENOL) tablet 650 mg  650 mg Oral Q6H PRN Susa Day, MD       Or  . acetaminophen (TYLENOL) suppository 650 mg  650 mg Rectal Q6H PRN Susa Day, MD      . acidophilus (RISAQUAD) capsule 1 capsule  1 capsule Oral Daily Susa Day, MD   Stopped at 05/30/16 1145  . albuterol (PROVENTIL) (2.5 MG/3ML) 0.083% nebulizer solution 2.5 mg  2.5 mg Nebulization Q6H PRN Susa Day, MD      . alum & mag hydroxide-simeth (MAALOX/MYLANTA) 200-200-20 MG/5ML suspension 30 mL  30 mL Oral Q4H PRN Susa Day, MD      . Derrill Memo ON 05/31/2016] amLODipine (NORVASC) tablet 10 mg  10 mg Oral Daily Susa Day, MD      . bisacodyl (DULCOLAX) EC tablet 5 mg  5 mg Oral Daily PRN Susa Day, MD      . ceFAZolin (ANCEF) IVPB 2g/100 mL premix  2 g Intravenous Q6H Susa Day, MD   2 g at 05/30/16 1253  . dextrose 5 % and 0.45 % NaCl with KCl 20 mEq/L infusion   Intravenous Continuous Susa Day, MD 50 mL/hr at 05/30/16  1200 50 mL/hr at 05/30/16 1200  . diphenhydrAMINE (BENADRYL) 12.5 MG/5ML elixir 12.5-25 mg  12.5-25 mg Oral Q4H PRN Susa Day, MD      . docusate sodium (COLACE) capsule 100 mg  100 mg Oral BID Susa Day, MD      . HYDROmorphone (DILAUDID) injection 0.5-1 mg  0.5-1 mg Intravenous Q2H PRN Cecilie Kicks, PA-C   1 mg at 05/30/16 1253  . ketoconazole (NIZORAL) 2 % shampoo   Topical Once Susa Day, MD      . magnesium citrate solution 1 Bottle  1 Bottle Oral Once PRN Susa Day, MD      . menthol-cetylpyridinium (CEPACOL) lozenge 3 mg  1 lozenge Oral PRN Susa Day, MD       Or  . phenol (CHLORASEPTIC) mouth spray 1 spray  1 spray Mouth/Throat PRN Susa Day, MD      .  methocarbamol (ROBAXIN) tablet 500 mg  500 mg Oral Q6H PRN Susa Day, MD       Or  . methocarbamol (ROBAXIN) 500 mg in dextrose 5 % 50 mL IVPB  500 mg Intravenous Q6H PRN Susa Day, MD   500 mg at 05/30/16 1133  . metoCLOPramide (REGLAN) tablet 5-10 mg  5-10 mg Oral Q8H PRN Susa Day, MD       Or  . metoCLOPramide (REGLAN) injection 5-10 mg  5-10 mg Intravenous Q8H PRN Susa Day, MD      . mometasone-formoterol (DULERA) 200-5 MCG/ACT inhaler 2 puff  2 puff Inhalation BID Susa Day, MD      . montelukast (SINGULAIR) tablet 10 mg  10 mg Oral QHS Susa Day, MD      . ondansetron [facility]) tablet 4 mg  4 mg Oral Q6H PRN Susa Day, MD       Or  . ondansetron [facility]) injection 4 mg  4 mg Intravenous Q6H PRN Susa Day, MD      . oxyCODONE (Oxy IR/ROXICODONE) immediate release tablet 5-10 mg  5-10 mg Oral Q3H PRN Susa Day, MD   5 mg at 05/30/16 1200  . pantoprazole (PROTONIX) EC tablet 40 mg  40 mg Oral Daily Susa Day, MD      . polyethylene glycol (MIRALAX / GLYCOLAX) packet 17 g  17 g Oral Daily PRN Susa Day, MD      . Derrill Memo ON 05/31/2016] rivaroxaban (XARELTO) tablet 10 mg  10 mg Oral Q breakfast Susa Day, MD      . spironolactone (ALDACTONE) tablet 25 mg  25 mg Oral Daily Susa Day, MD         Discharge Medications: Please see discharge summary for a list of discharge medications.  Relevant Imaging Results:  Relevant Lab Results:   Additional Information SS # [id number]  Temple Ewart, Randall An, LCSW

## 2016-05-30 NOTE — Clinical Social Work Note (Signed)
Clinical Social Work Assessment  Patient Details  Name: Charles Mccoy MRN: [id number] Date of Birth: Sep 02, 1951  Date of referral:  05/30/16               Reason for consult:  Discharge Planning                Permission sought to share information with:  Chartered certified accountant granted to share information::  Yes, Verbal Permission Granted  Name::        Agency::     Relationship::     Contact Information:     Housing/Transportation Living arrangements for the past 2 months:  Single Family Home Source of Information:  Patient, Spouse Patient Interpreter Needed:  None Criminal Activity/Legal Involvement Pertinent to Current Situation/Hospitalization:  No - Comment as needed Significant Relationships:  Spouse Lives with:  Spouse Do you feel safe going back to the place where you live?  No (Requesting ST Rehab.) Need for family participation in patient care:  Yes (Comment)  Care giving concerns:  Pt's care cannot be managed at home following hospital d/c.   Social Worker assessment / plan:  Pt hospitalized on 05/30/16 from home for pre planned right total knee arthroplasty. CSW met with pt / spouse to assist with d/c planning. Pt / spouse report that they have made prior arrangements to have ST Rehab at [facility] following hospital d/c. CSW has contacted [facility] and d/c plan has been confirmed pending [employer]. CSW will assist with insurance authorization process once pt has been evaluated by PT and recommendations are available.   Employment status:  Health visitor PT Recommendations:    Information / Referral to community resources:  [place]  Patient/Family's Response to care:  Pt / spouse feel ST Rehab is needed.  Patient/Family's Understanding of and Emotional Response to Diagnosis, Current Treatment, and Prognosis:  Pt / spouse are aware of pt's medical status. Nsg is  assisting with pain management. Spouse reports that pt had a good experience at [facility] in the past and is looking forward to returning for rehab.  Emotional Assessment Appearance:  Appears stated age Attitude/Demeanor/Rapport:  Other (cooperative) Affect (typically observed):  Calm Orientation:  Oriented to Self, Oriented to Place, Oriented to  Time, Oriented to Situation Alcohol / Substance use:  Not Applicable Psych involvement (Current and /or in the community):  No (Comment)  Discharge Needs  Concerns to be addressed:  Discharge Planning Concerns Readmission within the last 30 days:  No Current discharge risk:  None Barriers to Discharge:  No Barriers Identified   [name], [place] 05/30/2016, 2:17 PM

## 2016-05-30 NOTE — Interval H&P Note (Signed)
History and Physical Interval Note:  05/30/2016 7:14 AM  Charles Mccoy  has presented today for surgery, with the diagnosis of RIGHT KNEE DJD  The various methods of treatment have been discussed with the patient and family. After consideration of risks, benefits and other options for treatment, the patient has consented to  Procedure(s): RIGHT TOTAL KNEE ARTHROPLASTY REPLACEMENT (Right) as a surgical intervention .  The patient's history has been reviewed, patient examined, no change in status, stable for surgery.  I have reviewed the patient's chart and labs.  Questions were answered to the patient's satisfaction.     Kathryn Linarez C

## 2016-05-30 NOTE — Brief Op Note (Signed)
05/30/2016  9:33 AM  PATIENT:  Charles Mccoy  65 y.o. male  PRE-OPERATIVE DIAGNOSIS:  RIGHT KNEE DEGENERATIVE JOINT DISEASE  POST-OPERATIVE DIAGNOSIS:  RIGHT KNEE DEGENERATIVE JOINT DISEASE  PROCEDURE:  Procedure(s): RIGHT TOTAL KNEE ARTHROPLASTY REPLACEMENT (Right)  SURGEON:  Surgeon(s) and Role:    * Susa Day, MD - Primary  PHYSICIAN ASSISTANT:   ASSISTANTS: [place]   ANESTHESIA:   spinal  EBL:  Total I/O In: 1000 [I.V.:1000] Out: -   BLOOD ADMINISTERED:none  DRAINS: none   LOCAL MEDICATIONS USED:  MARCAINE     SPECIMEN:  No Specimen  DISPOSITION OF SPECIMEN:  N/A  COUNTS:  YES  TOURNIQUET:   Total Tourniquet Time Documented: Thigh (Right) - 81 minutes Total: Thigh (Right) - 81 minutes   DICTATION: .Viviann Spare Dictation [phone]  PLAN OF CARE: Admit to inpatient   PATIENT DISPOSITION:  PACU - hemodynamically stable.   Delay start of Pharmacological VTE agent (>24hrs) due to surgical blood loss or risk of bleeding: no

## 2016-05-30 NOTE — Clinical Social Work Placement (Signed)
   CLINICAL SOCIAL WORK PLACEMENT  NOTE  Date:  05/30/2016  Patient Details  Name: Charles Mccoy MRN: [id number] Date of Birth: 05-06-1951  Clinical Social Work is seeking post-discharge placement for this patient at the [place] level of care (*CSW will initial, date and re-position this form in  chart as items are completed):  No   Patient/family provided with [facility] Work Department's list of facilities offering this level of care within the geographic area requested by the patient (or if unable, by the patient's family).  Yes   Patient/family informed of their freedom to choose among providers that offer the needed level of care, that participate in Medicare, Medicaid or managed care program needed by the patient, have an available bed and are willing to accept the patient.  Yes   Patient/family informed of [facility]'s ownership interest in [facility] and [facility], as well as of the fact that they are under no obligation to receive care at these facilities.  PASRR submitted to EDS on       PASRR number received on       Existing PASRR number confirmed on 05/30/16     FL2 transmitted to all facilities in geographic area requested by pt/family on 05/30/16     FL2 transmitted to all facilities within larger geographic area on       Patient informed that his/her managed care company has contracts with or will negotiate with certain facilities, including the following:            Patient/family informed of bed offers received.  Patient chooses bed at       Physician recommends and patient chooses bed at      Patient to be transferred to   on  .  Patient to be transferred to facility by       Patient family notified on   of transfer.  Name of family member notified:        PHYSICIAN       Additional Comment:    _______________________________________________ [name], [place] 05/30/2016, 2:31 PM

## 2016-05-30 NOTE — H&P (View-Only) (Signed)
Charles Mccoy DOB: [birth year] Married / Language: Cleophus Mccoy / Race: White Male  H&P Date: 05/20/16  Chief Complaint: Right knee pain  History of Present Illness  The patient is a 65 year old male who comes in today for a preoperative History and Physical. The patient is scheduled for a right total knee arthroplasty to be performed by Dr. Johnn Hai, MD at [facility] on 05/30/2016. Terron reports chronic pain in the right knee for years. He is s/p L TKA which is doing very well. The right knee has onoing pain, and increased varus defority, refractory to bracing, activity modifications, quad strengthening, medications, HEP, quad strengthening, relative rest, steroid and viscosupplementation injections. Pain is interfering with ADLs and quality of life at this poiont and pt desires to proceed with surgery.  Dr. Tonita Cong and the patient mutually agreed to proceed with a right total knee replacement. Risks and benefits of the procedure were discussed including stiffness, suboptimal range of motion, persistent pain, infection requiring removal of prosthesis and reinsertion, need for prophylactic antibiotics in the future, for example, dental procedures, possible need for manipulation, revision in the future and also anesthetic complications including DVT, PE, etc. We discussed the perioperative course, time in the hospital, postoperative recovery and the need for elevation to control swelling. We also discussed the predicted range of motion and the probability that squatting and kneeling would be unobtainable in the future. In addition, postoperative anticoagulation was discussed. We have obtained preoperative medical clearance as necessary. Provided her illustrated handout and discussed it in detail. They will enroll in the total joint replacement educational forum at the hospital. His pre-op appt at the hospital is scheduled for later today.   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 grandfather mothers side Cancer  Father. father Osteoarthritis  Mother. mother Hypertension  Maternal Grandmother, Mother. mother First Degree Relatives   Social History Tobacco use  Former smoker. 04/06/2014: smoke(d) 1 pack(s) per day former smoker Living situation  live with spouse. 15 steps to enter home. 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 Alcohol use  current drinker; drinks beer; 8-14 per week Not under pain contract  No history of drug/alcohol rehab  Number of flights of stairs before winded  4-5 greater than 5 Tobacco / smoke exposure  04/06/2014: yes outdoors only yes outdoors only Drug/Alcohol Rehab (Currently)  no Current drinker  04/06/2014: Currently drinks beer, wine and hard liquor 5-7 times per week Children  2 Current work status  working full time Marital status  married Exercise  Exercises weekly; does running / walking Exercises daily; does individual sport  Medication History Celecoxib (200MG  Capsule, 1 (one) Oral po qd pc, Taken starting 05/20/2016) Active. Singulair (10MG  Tablet, Oral) Active. Montelukast Sodium (10MG  Tablet, Oral) Active. AmLODIPine  Besylate (10MG  Tablet, Oral daily) Active. Omeprazole (20MG  Capsule DR,  Oral) Active. ProAir HFA (108 (90 Base)MCG/ACT Aerosol Soln, Inhalation) Active. Advair Diskus (250-50MCG/DOSE Aero Pow Br Act, Inhalation) Active. Medications Reconciled Spironolactone (25MG  Tablet, Oral daily) Active.  Past Surgical History Arthroscopy of Knee  bilateral Total Knee Replacement - Left   Review of Systems General Not Present- Chills, Fatigue, Fever, Memory Loss, Night Sweats, Weight Gain and Weight Loss. Skin Not Present- Eczema, Hives, Itching, Lesions and Rash. HEENT Not Present- Dentures, Double Vision, Headache, Hearing Loss, Tinnitus and Visual Loss. Respiratory Present- Allergies. Not Present- Chronic Cough, Coughing up blood, Shortness of breath at rest and Shortness of breath with exertion. Cardiovascular Not Present- Chest Pain, Difficulty Breathing Lying Down, Murmur, Palpitations, Racing/skipping heartbeats and Swelling. Gastrointestinal Not Present- Abdominal Pain, Bloody Stool, Constipation, Diarrhea, Difficulty Swallowing, Heartburn, Jaundice, Loss of appetitie, Nausea and Vomiting. Male Genitourinary Present- Urinating at Night. Not Present- Blood in Urine, Discharge, Flank Pain, Incontinence, Painful Urination, Urgency, Urinary frequency, Urinary Retention and Weak urinary stream. Musculoskeletal Present- Joint Pain and Joint Swelling. Not Present- Back Pain, Morning Stiffness, Muscle Pain, Muscle Weakness and Spasms. Neurological Not Present- Blackout spells, Difficulty with balance, Dizziness, Paralysis, Tremor and Weakness. Psychiatric Not Present- Insomnia.  Physical Exam General Mental Status -Alert, cooperative and good historian. General Appearance-pleasant, Not in acute distress. Orientation-Oriented X3. Build & Nutrition-Well nourished and Well developed.  Head and Neck Head-normocephalic, atraumatic . Neck Global Assessment - supple, no bruit  auscultated on the right, no bruit auscultated on the left.  Eye Pupil - Bilateral-Regular and Round. Motion - Bilateral-EOMI.  Chest and Lung Exam Auscultation Breath sounds - clear at anterior chest wall and clear at posterior chest wall. Adventitious sounds - No Adventitious sounds.  Cardiovascular Auscultation Rhythm - Regular rate and rhythm. Heart Sounds - S1 WNL and S2 WNL. Murmurs & Other Heart Sounds - Auscultation of the heart reveals - No Murmurs.  Abdomen Palpation/Percussion Tenderness - Abdomen is non-tender to palpation. Rigidity (guarding) - Abdomen is soft. Auscultation Auscultation of the abdomen reveals - Bowel sounds normal.  Male Genitourinary Note: Not done, not pertinent to present illness  Musculoskeletal Note: On exam of the right knee, he is tender to palpation medial and lateral joint lines. Trace effusion, no sign of DVT or positive patellofemoral pain with compression. He has full range of motion. Does have an antalgic gait.  Imaging xrays with end-stage degenerative changes right knee, bone-on-bone medial compartment, varus deformity.  Assessment & Plan Primary osteoarthritis of right knee (M17.11)  Pt with end-stage right knee DJD, bone-on-bone, refractory to conservative tx, scheduled for right total knee replacement by Dr. Tonita Cong on 05/30/16. We again discussed the procedure itself as well as risks, complications and alternatives, including but not limited to DVT, PE, infx, bleeding, failure of procedure, need for secondary procedure including manipulation, nerve injury, ongoing pain/symptoms, anesthesia risk, even stroke or death. Also discussed typical post-op protocols, activity restrictions, need for PT, flexion/extension exercises, time out of work. Discussed need for DVT ppx post-op with Xarelto then ASA per protocol. Discussed dental ppx. Also discussed limitations post-operatively such as kneeling and squatting. All questions were answered.  Patient desires to proceed with surgery as scheduled. Will hold ASA and NSAIDs accordingly. Will remain NPO after MN night before surgery. Will present to [facility] for pre-op testing. Plan Xarelto 2 weeks post-op for DVT ppx then ASA. Plan Percocet for pain, Robaxin, Colace, Miralax. Plan short stay at SNF post-op prior to return hoem with family members at home for assistance, due to flight of 15 stairs  to enter. He stayed at [facility] following his contralateral side. Will follow up 10-14 days post-op for staple removal and xrays.  Plan right total knee replacement  Signed electronically by Lacie Draft, PA-C for Dr. Tonita Cong

## 2016-05-30 NOTE — Transfer of Care (Signed)
Immediate Anesthesia Transfer of Care Note  Patient: Charles Mccoy  Procedure(s) Performed: Procedure(s): RIGHT TOTAL KNEE ARTHROPLASTY REPLACEMENT (Right)  Patient Location: PACU  Anesthesia Type:Spinal  Level of Consciousness: awake, alert  and oriented  Airway & Oxygen Therapy: Patient Spontanous Breathing and Patient connected to face mask oxygen  Post-op Assessment: Report given to RN and Post -op Vital signs reviewed and stable  Post vital signs: Reviewed and stable  Last Vitals:  Vitals:   05/30/16 0527  BP: (!) 155/98  Pulse: 63  Resp: 16  Temp: 36.7 C    Last Pain:  Vitals:   05/30/16 0542  TempSrc:   PainSc: 4       Patients Stated Pain Goal: 3 ([id number])  Complications: No apparent anesthesia complications

## 2016-05-30 NOTE — Anesthesia Postprocedure Evaluation (Signed)
Anesthesia Post Note  Patient: Carle Laque  Procedure(s) Performed: Procedure(s) (LRB): RIGHT TOTAL KNEE ARTHROPLASTY REPLACEMENT (Right)  Patient location during evaluation: PACU Anesthesia Type: Spinal and Regional Level of consciousness: oriented and awake and alert Pain management: pain level controlled Vital Signs Assessment: post-procedure vital signs reviewed and stable Respiratory status: spontaneous breathing, respiratory function stable and patient connected to nasal cannula oxygen Cardiovascular status: blood pressure returned to baseline and stable Postop Assessment: no headache and no backache Anesthetic complications: no    Last Vitals:  Vitals:   05/30/16 0958 05/30/16 1000  BP: 122/80 128/85  Pulse: 66 66  Resp: 12 14  Temp: 36.4 C     Last Pain:  Vitals:   05/30/16 1030  TempSrc:   PainSc: (P) 0-No pain                 Zenaida Deed

## 2016-05-30 NOTE — Evaluation (Signed)
Physical Therapy Evaluation Patient Details Name: Charles Mccoy MRN: [id number] DOB: [birth year] Today's Date: 05/30/2016   History of Present Illness  Pt s/p R TKR with hx of L TKR  Clinical Impression  Pt s/p R TKR presents with decreased R LE strength/ROM and post op pain limiting functional mobility.  Pt would benefit from follow up rehab at SNF level to maximize IND and safety prior to return home with ltd assist.    Follow Up Recommendations SNF    Equipment Recommendations  None recommended by PT    Recommendations for Other Services OT consult     Precautions / Restrictions Precautions Precautions: Knee;Fall Required Braces or Orthoses: Knee Immobilizer - Right Knee Immobilizer - Right: Discontinue once straight leg raise with < 10 degree lag Restrictions Weight Bearing Restrictions: No      Mobility  Bed Mobility Overal bed mobility: Needs Assistance Bed Mobility: Supine to Sit;Sit to Supine     Supine to sit: Min assist Sit to supine: Min assist   General bed mobility comments: cues for sequence and use of L LE to self assist  Transfers Overall transfer level: Needs assistance Equipment used: Rolling walker (2 wheeled) Transfers: Sit to/from Stand Sit to Stand: Min assist         General transfer comment: cues for LE management and use of UEs to self assist  Ambulation/Gait Ambulation/Gait assistance: Min assist Ambulation Distance (Feet): 29 Feet Assistive device: Rolling walker (2 wheeled) Gait Pattern/deviations: Step-to pattern;Decreased step length - right;Decreased step length - left;Shuffle;Trunk flexed Gait velocity: decr Gait velocity interpretation: Below normal speed for age/gender General Gait Details: cues for sequence, posture and position from [employer]            Wheelchair Mobility    Modified Rankin (Stroke Patients Only)       Balance                                             Pertinent  Vitals/Pain Pain Assessment: 0-10 Pain Score: 5  Pain Location: R knee Pain Descriptors / Indicators: Aching;Sore Pain Intervention(s): Limited activity within patient's tolerance;Monitored during session;Premedicated before session;Ice applied    Home Living Family/patient expects to be discharged to:: Skilled nursing facility Living Arrangements: Spouse/significant other               Additional Comments: Has 17 stairs to climb to access home    Prior Function Level of Independence: Independent               Hand Dominance        Extremity/Trunk Assessment   Upper Extremity Assessment: Overall WFL for tasks assessed           Lower Extremity Assessment: RLE deficits/detail      Cervical / Trunk Assessment: Normal  Communication   Communication: No difficulties  Cognition Arousal/Alertness: Awake/alert Behavior During Therapy: WFL for tasks assessed/performed Overall Cognitive Status: Within Functional Limits for tasks assessed                      General Comments      Exercises Total Joint Exercises Ankle Circles/Pumps: AROM;Both;15 reps;Supine      Assessment/Plan    PT Assessment Patient needs continued PT services  PT Diagnosis Difficulty walking   PT Problem List Decreased strength;Decreased range of motion;Decreased activity tolerance;Decreased balance;Decreased mobility;Decreased  knowledge of use of DME;Pain  PT Treatment Interventions DME instruction;Gait training;Stair training;Functional mobility training;Therapeutic activities;Therapeutic exercise;Patient/family education   PT Goals (Current goals can be found in the Care Plan section) Acute Rehab PT Goals Patient Stated Goal: Regain IND PT Goal Formulation: With patient Time For Goal Achievement: 06/03/16 Potential to Achieve Goals: Good    Frequency 7X/week   Barriers to discharge        Co-evaluation               End of Session Equipment Utilized During  Treatment: Gait belt;Right knee immobilizer Activity Tolerance: Patient tolerated treatment well Patient left: in bed;with call bell/phone within reach;with family/visitor present Nurse Communication: Mobility status         Time: [id number] PT Time Calculation (min) (ACUTE ONLY): 27 min   Charges:   PT Evaluation $PT Eval Low Complexity: 1 Procedure PT Treatments $Gait Training: 8-22 mins   PT G Codes:        Jahkeem Kurka 06-17-2016, 4:17 PM

## 2016-05-31 LAB — BASIC METABOLIC PANEL
Anion gap: 8 (ref 5–15)
BUN: 10 mg/dL (ref 6–20)
CHLORIDE: 92 mmol/L — AB (ref 101–111)
CO2: 29 mmol/L (ref 22–32)
Calcium: 8.4 mg/dL — ABNORMAL LOW (ref 8.9–10.3)
Creatinine, Ser: 0.86 mg/dL (ref 0.61–1.24)
GFR calc Af Amer: >60 mL/min (ref >60)
GFR calc non Af Amer: >60 mL/min (ref >60)
GLUCOSE: 155 mg/dL — AB (ref 65–99)
POTASSIUM: 4.3 mmol/L (ref 3.5–5.1)
Sodium: 129 mmol/L — ABNORMAL LOW (ref 135–145)

## 2016-05-31 LAB — CBC
HEMATOCRIT: 42.3 % (ref 39.0–52.0)
HEMOGLOBIN: 14.3 g/dL (ref 13.0–17.0)
MCH: 28 pg (ref 26.0–34.0)
MCHC: 33.8 g/dL (ref 30.0–36.0)
MCV: 82.8 fL (ref 78.0–100.0)
Platelets: 240 10*3/uL (ref 150–400)
RBC: 5.11 MIL/uL (ref 4.22–5.81)
RDW: 13.6 % (ref 11.5–15.5)
WBC: 11.3 10*3/uL — ABNORMAL HIGH (ref 4.0–10.5)

## 2016-05-31 NOTE — Progress Notes (Signed)
Physical Therapy Treatment Patient Details Name: Charles Mccoy MRN: [id number] DOB: 21-Nov-1950 Today's Date: 05/31/2016    History of Present Illness Pt s/p R TKR with hx of L TKR    PT Comments    Pt continues motivated and progressing well with mobility.  Pt eager for dc to rehab tomorrow.  Follow Up Recommendations  SNF     Equipment Recommendations  None recommended by PT    Recommendations for Other Services OT consult     Precautions / Restrictions Precautions Precautions: Knee;Fall Required Braces or Orthoses: Knee Immobilizer - Right Knee Immobilizer - Right: Discontinue once straight leg raise with < 10 degree lag Restrictions Weight Bearing Restrictions: No Other Position/Activity Restrictions: WBAT    Mobility  Bed Mobility Overal bed mobility: Needs Assistance Bed Mobility: Sit to Supine       Sit to supine: Min assist   General bed mobility comments: cues for sequence and use of L LE to self assist  Transfers Overall transfer level: Needs assistance Equipment used: Rolling walker (2 wheeled) Transfers: Sit to/from Stand Sit to Stand: Min assist         General transfer comment: cues for LE management and use of UEs to self assist  Ambulation/Gait Ambulation/Gait assistance: Min assist;Min guard Ambulation Distance (Feet): 74 Feet (twice) Assistive device: Rolling walker (2 wheeled) Gait Pattern/deviations: Step-to pattern;Decreased step length - right;Decreased step length - left;Shuffle;Trunk flexed Gait velocity: decr Gait velocity interpretation: Below normal speed for age/gender General Gait Details: cues for sequence, posture and position from [employer]            Wheelchair Mobility    Modified Rankin (Stroke Patients Only)       Balance                                    Cognition Arousal/Alertness: Awake/alert Behavior During Therapy: WFL for tasks assessed/performed Overall Cognitive Status: Within  Functional Limits for tasks assessed                      Exercises      General Comments        Pertinent Vitals/Pain Pain Assessment: 0-10 Pain Score: 3  Pain Location: R knee Pain Descriptors / Indicators: Aching;Tightness Pain Intervention(s): Limited activity within patient's tolerance;Monitored during session;Premedicated before session;Ice applied    Home Living Family/patient expects to be discharged to:: Skilled nursing facility Living Arrangements: Spouse/significant other   Type of Home: House       Home Equipment: Gilford Rile - 2 wheels;Bedside commode;Cane - single point;Crutches Additional Comments: Has 17 stairs to climb to access home    Prior Function Level of Independence: Independent      Comments: works   PT Goals (current goals can now be found in the care plan section) Acute Rehab PT Goals Patient Stated Goal: Regain IND PT Goal Formulation: With patient Time For Goal Achievement: 06/03/16 Potential to Achieve Goals: Good Progress towards PT goals: Progressing toward goals    Frequency  7X/week    PT Plan Current plan remains appropriate    Co-evaluation             End of Session Equipment Utilized During Treatment: Gait belt;Right knee immobilizer Activity Tolerance: Patient tolerated treatment well Patient left: in bed;with call bell/phone within reach;with family/visitor present     Time: 1350-1420 PT Time Calculation (min) (ACUTE ONLY): 30  min  Charges:  $Gait Training: 23-37 mins                    G Codes:      Charles Mccoy Jun 30, 2016, 4:18 PM

## 2016-05-31 NOTE — Discharge Instructions (Signed)
Elevate leg above heart 6x a day for 45minutes each Use knee immobilizer while walking until can SLR x 10 Use knee immobilizer in bed to keep knee in extension Aquacel dressing may remain in place until follow up. May shower with aquacel dressing in place. If the dressing becomes saturated or peels off, you may remove aquacel dressing. Place new dressing with gauze and tape or ACE bandage which should be kept clean and dry and changed daily.  INSTRUCTIONS AFTER JOINT REPLACEMENT   o Remove items at home which could result in a fall. This includes throw rugs or furniture in walking pathways o ICE to the affected joint every three hours while awake for 30 minutes at a time, for at least the first 3-5 days, and then as needed for pain and swelling.  Continue to use ice for pain and swelling. You may notice swelling that will progress down to the foot and ankle.  This is normal after surgery.  Elevate your leg when you are not up walking on it.   o Continue to use the breathing machine you got in the hospital (incentive spirometer) which will help keep your temperature down.  It is common for your temperature to cycle up and down following surgery, especially at night when you are not up moving around and exerting yourself.  The breathing machine keeps your lungs expanded and your temperature down.   DIET:  As you were doing prior to hospitalization, we recommend a well-balanced diet.  DRESSING / WOUND CARE / SHOWERING  Keep the surgical dressing until follow up.  The dressing is water proof, so you can shower without any extra covering.  IF THE DRESSING FALLS OFF or the wound gets wet inside, change the dressing with sterile gauze.  Please use good hand washing techniques before changing the dressing.  Do not use any lotions or creams on the incision until instructed by your surgeon.    ACTIVITY  o Increase activity slowly as tolerated, but follow the weight bearing instructions below.   o No  driving for 6 weeks or until further direction given by your physician.  You cannot drive while taking narcotics.  o No lifting or carrying greater than 10 lbs. until further directed by your surgeon. o Avoid periods of inactivity such as sitting longer than an hour when not asleep. This helps prevent blood clots.  o You may return to work once you are authorized by your doctor.     WEIGHT BEARING   Weight bearing as tolerated with assist device (walker, cane, etc) as directed, use it as long as suggested by your surgeon or therapist, typically at least 4-6 weeks.   EXERCISES  Results after joint replacement surgery are often greatly improved when you follow the exercise, range of motion and muscle strengthening exercises prescribed by your doctor. Safety measures are also important to protect the joint from further injury. Any time any of these exercises cause you to have increased pain or swelling, decrease what you are doing until you are comfortable again and then slowly increase them. If you have problems or questions, call your caregiver or physical therapist for advice.   Rehabilitation is important following a joint replacement. After just a few days of immobilization, the muscles of the leg can become weakened and shrink (atrophy).  These exercises are designed to build up the tone and strength of the thigh and leg muscles and to improve motion. Often times heat used for twenty to thirty minutes  before working out will loosen up your tissues and help with improving the range of motion but do not use heat for the first two weeks following surgery (sometimes heat can increase post-operative swelling).   These exercises can be done on a training (exercise) mat, on the floor, on a table or on a bed. Use whatever works the best and is most comfortable for you.    Use music or television while you are exercising so that the exercises are a pleasant break in your day. This will make your life  better with the exercises acting as a break in your routine that you can look forward to.   Perform all exercises about fifteen times, three times per day or as directed.  You should exercise both the operative leg and the other leg as well.  Exercises include:    Quad Sets - Tighten up the muscle on the front of the thigh (Quad) and hold for 5-10 seconds.    Straight Leg Raises - With your knee straight (if you were given a brace, keep it on), lift the leg to 60 degrees, hold for 3 seconds, and slowly lower the leg.  Perform this exercise against resistance later as your leg gets stronger.   Leg Slides: Lying on your back, slowly slide your foot toward your buttocks, bending your knee up off the floor (only go as far as is comfortable). Then slowly slide your foot back down until your leg is flat on the floor again.   Angel Wings: Lying on your back spread your legs to the side as far apart as you can without causing discomfort.   Hamstring Strength:  Lying on your back, push your heel against the floor with your leg straight by tightening up the muscles of your buttocks.  Repeat, but this time bend your knee to a comfortable angle, and push your heel against the floor.  You may put a pillow under the heel to make it more comfortable if necessary.   A rehabilitation program following joint replacement surgery can speed recovery and prevent re-injury in the future due to weakened muscles. Contact your doctor or a physical therapist for more information on knee rehabilitation.    CONSTIPATION  Constipation is defined medically as fewer than three stools per week and severe constipation as less than one stool per week.  Even if you have a regular bowel pattern at home, your normal regimen is likely to be disrupted due to multiple reasons following surgery.  Combination of anesthesia, postoperative narcotics, change in appetite and fluid intake all can affect your bowels.   YOU MUST use at least  one of the following options; they are listed in order of increasing strength to get the job done.  They are all available over the counter, and you may need to use some, POSSIBLY even all of these options:    Drink plenty of fluids (prune juice may be helpful) and high fiber foods Colace 100 mg by mouth twice a day  Senokot for constipation as directed and as needed Dulcolax (bisacodyl), take with full glass of water  Miralax (polyethylene glycol) once or twice a day as needed.  If you have tried all these things and are unable to have a bowel movement in the first 3-4 days after surgery call either your surgeon or your primary doctor.    If you experience loose stools or diarrhea, hold the medications until you stool forms back up.  If your symptoms do  not get better within 1 week or if they get worse, check with your doctor.  If you experience "the worst abdominal pain ever" or develop nausea or vomiting, please contact the office immediately for further recommendations for treatment.   ITCHING:  If you experience itching with your medications, try taking only a single pain pill, or even half a pain pill at a time.  You can also use Benadryl over the counter for itching or also to help with sleep.   TED HOSE STOCKINGS:  Use stockings on both legs until for at least 2 weeks or as directed by physician office. They may be removed at night for sleeping.  MEDICATIONS:  See your medication summary on the After Visit Summary that nursing will review with you.  You may have some home medications which will be placed on hold until you complete the course of blood thinner medication.  It is important for you to complete the blood thinner medication as prescribed.  PRECAUTIONS:  If you experience chest pain or shortness of breath - call 911 immediately for transfer to the hospital emergency department.   If you develop a fever greater that 101 F, purulent drainage from wound, increased redness or  drainage from wound, foul odor from the wound/dressing, or calf pain - CONTACT YOUR SURGEON.                                                   FOLLOW-UP APPOINTMENTS:  If you do not already have a post-op appointment, please call the office for an appointment to be seen by your surgeon.  Guidelines for how soon to be seen are listed in your After Visit Summary, but are typically between 1-4 weeks after surgery.  OTHER INSTRUCTIONS:   Knee Replacement:  Do not place pillow under knee, focus on keeping the knee straight while resting. CPM instructions: 0-90 degrees, 2 hours in the morning, 2 hours in the afternoon, and 2 hours in the evening. Place foam block, curve side up under heel at all times except when in CPM or when walking.  DO NOT modify, tear, cut, or change the foam block in any way.  MAKE SURE YOU:   Understand these instructions.   Get help right away if you are not doing well or get worse.    Thank you for letting us be a part of your medical care team.  It is a privilege we respect greatly.  We hope these instructions will help you stay on track for a fast and full recovery!   Information on my medicine - XARELTO (Rivaroxaban)  This medication education was reviewed with me or my healthcare representative as part of my discharge preparation.  The pharmacist that spoke with me during my hospital stay was: Altha Harm  Why was Xarelto prescribed for you? Xarelto was prescribed for you to reduce the risk of blood clots forming after orthopedic surgery. The medical term for these abnormal blood clots is venous thromboembolism (VTE).  What do you need to know about xarelto ? Take your Xarelto ONCE DAILY at the same time every day. You may take it either with or without food.  If you have difficulty swallowing the tablet whole, you may crush it and mix in applesauce just prior to taking your dose.  Take Xarelto exactly as prescribed by your doctor and  DO NOT stop taking  Xarelto without talking to the doctor who prescribed the medication.  Stopping without other VTE prevention medication to take the place of Xarelto may increase your risk of developing a clot.  After discharge, you should have regular check-up appointments with your healthcare provider that is prescribing your Xarelto.    What do you do if you miss a dose? If you miss a dose, take it as soon as you remember on the same day then continue your regularly scheduled once daily regimen the next day. Do not take two doses of Xarelto on the same day.   Important Safety Information A possible side effect of Xarelto is bleeding. You should call your healthcare provider right away if you experience any of the following: ? Bleeding from an injury or your nose that does not stop. ? Unusual colored urine (red or dark brown) or unusual colored stools (red or black). ? Unusual bruising for unknown reasons. ? A serious fall or if you hit your head (even if there is no bleeding).  Some medicines may interact with Xarelto and might increase your risk of bleeding while on Xarelto. To help avoid this, consult your healthcare provider or pharmacist prior to using any new prescription or non-prescription medications, including herbals, vitamins, non-steroidal anti-inflammatory drugs (NSAIDs) and supplements.  This website has more information on Xarelto: [email or web address]

## 2016-05-31 NOTE — Progress Notes (Signed)
Patient ID: Charles Mccoy, male   DOB: 12/04/1950, 65 y.o.   MRN: [id number] Subjective: 1 Day Post-Op Procedure(s) (LRB): RIGHT TOTAL KNEE ARTHROPLASTY REPLACEMENT (Right) Patient reports pain as moderate.    Patient has complaints of R knee pain  We will start therapy today. Plan is to go to SNF- [facility] after hospital stay. Due to amount of steps at home and limited mobility post-op he required a short stay at [facility] following contralateral total knee. Seen by Dr. Tonita Cong in AM rounds.  Objective: Vital signs in last 24 hours: Temp:  [97.6 F (36.4 C)-99 F (37.2 C)] 98.7 F (37.1 C) (08/25 0531) Pulse Rate:  [51-97] 89 (08/25 0531) Resp:  [12-17] 15 (08/25 0531) BP: (111-164)/(74-95) 144/83 (08/25 0815) SpO2:  [95 %-100 %] 97 % (08/25 0531) Weight:  [109.3 kg (241 lb)] 109.3 kg (241 lb) (08/24 1133)  Intake/Output from previous day:  Intake/Output Summary (Last 24 hours) at 05/31/16 0901 Last data filed at 05/31/16 0730  Gross per 24 hour  Intake           2517.5 ml  Output             4075 ml  Net          -1557.5 ml    Intake/Output this shift: Total I/O In: 240 [P.O.:240] Out: -   Labs: Results for orders placed or performed during the hospital encounter of 05/30/16  CBC  Result Value Ref Range   WBC 11.3 (H) 4.0 - 10.5 K/uL   RBC 5.11 4.22 - 5.81 MIL/uL   Hemoglobin 14.3 13.0 - 17.0 g/dL   HCT 42.3 39.0 - 52.0 %   MCV 82.8 78.0 - 100.0 fL   MCH 28.0 26.0 - 34.0 pg   MCHC 33.8 30.0 - 36.0 g/dL   RDW 13.6 11.5 - 15.5 %   Platelets 240 150 - 400 K/uL  Basic metabolic panel  Result Value Ref Range   Sodium 129 (L) 135 - 145 mmol/L   Potassium 4.3 3.5 - 5.1 mmol/L   Chloride 92 (L) 101 - 111 mmol/L   CO2 29 22 - 32 mmol/L   Glucose, Bld 155 (H) 65 - 99 mg/dL   BUN 10 6 - 20 mg/dL   Creatinine, Ser 0.86 0.61 - 1.24 mg/dL   Calcium 8.4 (L) 8.9 - 10.3 mg/dL   GFR calc non Af Amer >60 >60 mL/min   GFR calc Af Amer >60 >60 mL/min   Anion gap 8 5 - 15     Exam - Neurologically intact ABD soft Neurovascular intact Sensation intact distally Intact pulses distally Dorsiflexion/Plantar flexion intact Incision: dressing C/D/I and no drainage No cellulitis present Compartment soft no calf pain or sign of DVT Dressing - clean, dry, no drainage Motor function intact - moving foot and toes well on exam.   Assessment/Plan: 1 Day Post-Op Procedure(s) (LRB): RIGHT TOTAL KNEE ARTHROPLASTY REPLACEMENT (Right)  Advance diet Up with therapy D/C IV fluids Past Medical History:  Diagnosis Date  . ALLERGIC RHINITIS 06/19/2007  . ASTHMA 08/17/2008  . Back pain 05/2016  . COPD (chronic obstructive pulmonary disease) ([place])   . GERD (gastroesophageal reflux disease)   . HYPERTENSION 06/19/2007  . Hypertension    meds controlling well  . OSTEOARTHRITIS 10/09/2009   wrists, knee.  . Pneumonia    hx of   . SINUSITIS 09/15/2007   recent issuses 2 weeks ago-clearing.    DVT Prophylaxis - Xarelto Protocol Weight-Bearing as tolerated  to Right leg No vaccines Up with PT today Plan D/C to [facility] this weekend   [place], Ellison Hughs M. 05/31/2016, 9:01 AM

## 2016-05-31 NOTE — Progress Notes (Signed)
Pt has ST Rehab bed at [facility]. [place] Falling has provided authorization. CSW will assist with d/c planning to SNF.  Werner Lean LCSW [phone]

## 2016-05-31 NOTE — Progress Notes (Signed)
Pt refused to take his Dulera BID. Pt states he takes PRN only at home. No distress noted at this time.

## 2016-05-31 NOTE — Progress Notes (Signed)
Physical Therapy Treatment Patient Details Name: Charles Mccoy MRN: [id number] DOB: July 31, 1951 Today's Date: 05/31/2016    History of Present Illness Pt s/p R TKR with hx of L TKR    PT Comments    Pt motivated and progressing well with mobility.  Follow Up Recommendations  SNF     Equipment Recommendations  None recommended by PT    Recommendations for Other Services OT consult     Precautions / Restrictions Precautions Precautions: Knee;Fall Required Braces or Orthoses: Knee Immobilizer - Right Knee Immobilizer - Right: Discontinue once straight leg raise with < 10 degree lag Restrictions Weight Bearing Restrictions: No Other Position/Activity Restrictions: WBAT    Mobility  Bed Mobility Overal bed mobility: Needs Assistance Bed Mobility: Supine to Sit     Supine to sit: Min assist     General bed mobility comments: cues for sequence and use of L LE to self assist  Transfers Overall transfer level: Needs assistance Equipment used: Rolling walker (2 wheeled) Transfers: Sit to/from Stand Sit to Stand: Min assist         General transfer comment: cues for LE management and use of UEs to self assist  Ambulation/Gait Ambulation/Gait assistance: Min assist Ambulation Distance (Feet): 57 Feet Assistive device: Rolling walker (2 wheeled) Gait Pattern/deviations: Step-to pattern;Decreased step length - right;Decreased step length - left;Shuffle;Trunk flexed Gait velocity: decr Gait velocity interpretation: Below normal speed for age/gender General Gait Details: cues for sequence, posture and position from [employer]            Wheelchair Mobility    Modified Rankin (Stroke Patients Only)       Balance                                    Cognition Arousal/Alertness: Awake/alert Behavior During Therapy: WFL for tasks assessed/performed Overall Cognitive Status: Within Functional Limits for tasks assessed                       Exercises Total Joint Exercises Ankle Circles/Pumps: AROM;Both;15 reps;Supine Quad Sets: AROM;Both;10 reps;Supine Heel Slides: AAROM;Right;Supine;15 reps Straight Leg Raises: AAROM;Right;10 reps;Supine Goniometric ROM: AAROM R knee -10 - 40    General Comments        Pertinent Vitals/Pain Pain Assessment: 0-10 Pain Score: 3  Pain Location: R knee Pain Descriptors / Indicators: Aching;Sore Pain Intervention(s): Limited activity within patient's tolerance;Monitored during session;Premedicated before session;Ice applied    Home Living                      Prior Function            PT Goals (current goals can now be found in the care plan section) Acute Rehab PT Goals Patient Stated Goal: Regain IND PT Goal Formulation: With patient Time For Goal Achievement: 06/03/16 Potential to Achieve Goals: Good Progress towards PT goals: Progressing toward goals    Frequency  7X/week    PT Plan Current plan remains appropriate    Co-evaluation             End of Session Equipment Utilized During Treatment: Gait belt;Right knee immobilizer Activity Tolerance: Patient tolerated treatment well Patient left: in chair;with call bell/phone within reach;with chair alarm set     Time: [id number] PT Time Calculation (min) (ACUTE ONLY): 32 min  Charges:  $Gait Training: 8-22 mins $Therapeutic Exercise: 8-22 mins  G Codes:      Charles Mccoy June 10, 2016, 11:27 AM

## 2016-05-31 NOTE — Discharge Summary (Deleted)
Physician Discharge Summary   Patient ID: Charles Mccoy MRN: [id number] DOB/AGE: January 20, 1951 65 y.o.  Admit date: 05/30/2016 Discharge date: anticipate 06/01/16  Primary Diagnosis: right knee primary osteoarthritis Admission Diagnoses:  Past Medical History:  Diagnosis Date  . ALLERGIC RHINITIS 06/19/2007  . ASTHMA 08/17/2008  . Back pain 05/2016  . COPD (chronic obstructive pulmonary disease) ([place])   . GERD (gastroesophageal reflux disease)   . HYPERTENSION 06/19/2007  . Hypertension    meds controlling well  . OSTEOARTHRITIS 10/09/2009   wrists, knee.  . Pneumonia    hx of   . SINUSITIS 09/15/2007   recent issuses 2 weeks ago-clearing.   Discharge Diagnoses:   Principal Problem:   Primary osteoarthritis of right knee Active Problems:   Right knee DJD  Estimated body mass index is 29.34 kg/m as calculated from the following:   Height as of this encounter: '6\' 4"'  (1.93 m).   Weight as of this encounter: 109.3 kg (241 lb).  Procedure:  Procedure(s) (LRB): RIGHT TOTAL KNEE ARTHROPLASTY REPLACEMENT (Right)   Consults: None  HPI: see H&P Laboratory Data: Admission on 05/30/2016  Component Date Value Ref Range Status  . WBC 05/31/2016 11.3* 4.0 - 10.5 K/uL Final  . RBC 05/31/2016 5.11  4.22 - 5.81 MIL/uL Final  . Hemoglobin 05/31/2016 14.3  13.0 - 17.0 g/dL Final  . HCT 05/31/2016 42.3  39.0 - 52.0 % Final  . MCV 05/31/2016 82.8  78.0 - 100.0 fL Final  . MCH 05/31/2016 28.0  26.0 - 34.0 pg Final  . MCHC 05/31/2016 33.8  30.0 - 36.0 g/dL Final  . RDW 05/31/2016 13.6  11.5 - 15.5 % Final  . Platelets 05/31/2016 240  150 - 400 K/uL Final  . Sodium 05/31/2016 129* 135 - 145 mmol/L Final  . Potassium 05/31/2016 4.3  3.5 - 5.1 mmol/L Final  . Chloride 05/31/2016 92* 101 - 111 mmol/L Final  . CO2 05/31/2016 29  22 - 32 mmol/L Final  . Glucose, Bld 05/31/2016 155* 65 - 99 mg/dL Final  . BUN 05/31/2016 10  6 - 20 mg/dL Final  . Creatinine, Ser 05/31/2016 0.86  0.61 - 1.24 mg/dL  Final  . Calcium 05/31/2016 8.4* 8.9 - 10.3 mg/dL Final  . GFR calc non Af Amer 05/31/2016 >60  >60 mL/min Final  . GFR calc Af Amer 05/31/2016 >60  >60 mL/min Final   Comment: (NOTE) The eGFR has been calculated using the CKD EPI equation. This calculation has not been validated in all clinical situations. eGFR's persistently <60 mL/min signify possible Chronic Kidney Disease.   Georgiann Hahn gap 05/31/2016 8  5 - 15 Final  Hospital Outpatient Visit on 05/21/2016  Component Date Value Ref Range Status  . MRSA, PCR 05/21/2016 NEGATIVE  NEGATIVE Final  . Staphylococcus aureus 05/21/2016 NEGATIVE  NEGATIVE Final   Comment:        The Xpert SA Assay (FDA approved for NASAL specimens in patients over 58 years of age), is one component of a comprehensive surveillance program.  Test performance has been validated by [facility] for patients greater than or equal to 20 year old. It is not intended to diagnose infection nor to guide or monitor treatment.   Marland Kitchen aPTT 05/21/2016 32  24 - 36 seconds Final  . Sodium 05/21/2016 137  135 - 145 mmol/L Final  . Potassium 05/21/2016 4.7  3.5 - 5.1 mmol/L Final  . Chloride 05/21/2016 105  101 - 111 mmol/L Final  . CO2 05/21/2016 27  22 - 32 mmol/L Final  . Glucose, Bld 05/21/2016 101* 65 - 99 mg/dL Final  . BUN 05/21/2016 17  6 - 20 mg/dL Final  . Creatinine, Ser 05/21/2016 0.99  0.61 - 1.24 mg/dL Final  . Calcium 05/21/2016 8.8* 8.9 - 10.3 mg/dL Final  . GFR calc non Af Amer 05/21/2016 >60  >60 mL/min Final  . GFR calc Af Amer 05/21/2016 >60  >60 mL/min Final   Comment: (NOTE) The eGFR has been calculated using the CKD EPI equation. This calculation has not been validated in all clinical situations. eGFR's persistently <60 mL/min signify possible Chronic Kidney Disease.   . Anion gap 05/21/2016 5  5 - 15 Final  . Prothrombin Time 05/21/2016 14.2  11.4 - 15.2 seconds Final  . INR 05/21/2016 1.09   Final  . ABO/RH(D) 05/30/2016 A POS   Final  .  Antibody Screen 05/30/2016 NEG   Final  . Sample Expiration 05/30/2016 06/02/2016   Final  . Extend sample reason 05/30/2016 NO TRANSFUSIONS OR PREGNANCY IN THE PAST 3 MONTHS   Final  . Color, Urine 05/21/2016 YELLOW  YELLOW Final  . APPearance 05/21/2016 CLEAR  CLEAR Final  . Specific Gravity, Urine 05/21/2016 1.012  1.005 - 1.030 Final  . pH 05/21/2016 5.0  5.0 - 8.0 Final  . Glucose, UA 05/21/2016 NEGATIVE  NEGATIVE mg/dL Final  . Hgb urine dipstick 05/21/2016 NEGATIVE  NEGATIVE Final  . Bilirubin Urine 05/21/2016 NEGATIVE  NEGATIVE Final  . Ketones, ur 05/21/2016 NEGATIVE  NEGATIVE mg/dL Final  . Protein, ur 05/21/2016 NEGATIVE  NEGATIVE mg/dL Final  . Nitrite 05/21/2016 NEGATIVE  NEGATIVE Final  . Leukocytes, UA 05/21/2016 NEGATIVE  NEGATIVE Final  . WBC 05/21/2016 6.9  4.0 - 10.5 K/uL Final  . RBC 05/21/2016 5.11  4.22 - 5.81 MIL/uL Final  . Hemoglobin 05/21/2016 14.4  13.0 - 17.0 g/dL Final  . HCT 05/21/2016 42.4  39.0 - 52.0 % Final  . MCV 05/21/2016 83.0  78.0 - 100.0 fL Final  . MCH 05/21/2016 28.2  26.0 - 34.0 pg Final  . MCHC 05/21/2016 34.0  30.0 - 36.0 g/dL Final  . RDW 05/21/2016 13.8  11.5 - 15.5 % Final  . Platelets 05/21/2016 230  150 - 400 K/uL Final     X-Rays:Dg Knee Right Port  Result Date: 05/30/2016 CLINICAL DATA:  Status post total knee replacement EXAM: PORTABLE RIGHT KNEE - 1-2 VIEW COMPARISON:  None. FINDINGS: Frontal and lateral views were obtained. The patient is status post total knee replacement with femoral and tibial prosthetic components appearing well-seated. No acute fracture or dislocation is evident. Air within the joint is an expected postoperative finding. There are foci of arterial vascular calcification. Skin staples are noted anteriorly. IMPRESSION: Total knee replacement prosthetic components appear well seated. No acute fracture or dislocation. There is arterial vascular calcification consistent with atherosclerosis. Extensive soft tissue air  is an expected postoperative finding. Electronically Signed   By: Lowella Grip III M.D.   On: 05/30/2016 10:28    EKG: Orders placed or performed during the hospital encounter of 05/21/16  . EKG 12 lead  . EKG 12 lead     Hospital Course: Burech Mcfarland is a 65 y.o. who was admitted to [facility]. They were brought to the operating room on 05/30/2016 and underwent Procedure(s): RIGHT TOTAL KNEE ARTHROPLASTY REPLACEMENT.  Patient tolerated the procedure well and was later transferred to the recovery room and then to the orthopaedic floor for postoperative care.  They were given PO and IV analgesics for pain control following their surgery.  They were given 24 hours of postoperative antibiotics of  Anti-infectives    Start     Dose/Rate Route Frequency Ordered Stop   05/30/16 1300  ceFAZolin (ANCEF) IVPB 2g/100 mL premix     2 g 200 mL/hr over 30 Minutes Intravenous Every 6 hours 05/30/16 1125 05/31/16 0228   05/30/16 0931  polymyxin B 500,000 Units, bacitracin 50,000 Units in sodium chloride irrigation 0.9 % 500 mL irrigation  Status:  Discontinued       As needed 05/30/16 0931 05/30/16 0955   05/30/16 0600  ceFAZolin (ANCEF) IVPB 2g/100 mL premix     2 g 200 mL/hr over 30 Minutes Intravenous On call to O.R. 05/30/16 1655 05/30/16 0729     and started on DVT prophylaxis in the form of Xarelto, TED hose and SCD's.   PT and OT were ordered for total joint protocol.  Discharge planning consulted to help with postop disposition and equipment needs.  Patient had a fair night on the evening of surgery.  They started to get up OOB with therapy on day one.  Continued to work with therapy into day two. By day two, the patient had progressed with therapy and meeting their goals.  Incision was healing well.  Patient was seen in rounds and was ready to go to [facility].   Diet: Regular diet Activity:WBAT Follow-up:in 10-14 days Disposition - SNF - [facility] Discharged Condition:  good      Medication List    STOP taking these medications   celecoxib 200 MG capsule Commonly known as:  CELEBREX   TYLENOL ARTHRITIS PAIN 650 MG CR tablet Generic drug:  acetaminophen     TAKE these medications   albuterol 108 (90 Base) MCG/ACT inhaler Commonly known as:  PROVENTIL HFA;VENTOLIN HFA Inhale 2 puffs into the lungs every 6 (six) hours as needed for wheezing or shortness of breath.   amLODipine 10 MG tablet Commonly known as:  NORVASC TAKE 1 TABLET (10 MG TOTAL) BY MOUTH EVERY MORNING.   docusate sodium 100 MG capsule Commonly known as:  COLACE Take 1 capsule (100 mg total) by mouth 2 (two) times daily as needed for mild constipation.   Fluticasone-Salmeterol 250-50 MCG/DOSE Aepb Commonly known as:  ADVAIR Inhale 1 puff into the lungs 2 (two) times daily.   ketoconazole 2 % shampoo Commonly known as:  NIZORAL USE DAILY AS DIRECTED   methocarbamol 500 MG tablet Commonly known as:  ROBAXIN Take 1 tablet (500 mg total) by mouth every 6 (six) hours as needed for muscle spasms.   montelukast 10 MG tablet Commonly known as:  SINGULAIR TAKE 1 TABLET BY MOUTH AT BEDTIME   omeprazole 20 MG capsule Commonly known as:  PRILOSEC TAKE 1 CAPSULE (20 MG TOTAL) BY MOUTH EVERY MORNING.   oxyCODONE-acetaminophen 10-325 MG tablet Commonly known as:  PERCOCET Take 1 tablet by mouth every 4 (four) hours as needed for pain (severe).   polyethylene glycol packet Commonly known as:  MIRALAX / GLYCOLAX Take 17 g by mouth daily.   PRESCRIPTION MEDICATION Place 1 spray into the nose daily as needed (allergies.). Qnasal nasal spray   rivaroxaban 10 MG Tabs tablet Commonly known as:  XARELTO Take 1 tablet (10 mg total) by mouth daily.   spironolactone 25 MG tablet Commonly known as:  ALDACTONE TAKE 1 TABLET EVERY DAY   vardenafil 20 MG tablet Commonly known as:  LEVITRA Take  20 mg by mouth daily as needed for erectile dysfunction.      Follow-up Information     BEANE,JEFFREY C, MD Follow up in 2 week(s).   Specialty:  Orthopedic Surgery Contact information: [address] [place] [address] [phone]        Johnn Hai, MD In 2 weeks.   Specialty:  Orthopedic Surgery Contact information: [address] [place] [address] [phone]           Signed: Lacie Draft, PA-C Orthopaedic Surgery 05/31/2016, 9:11 AM

## 2016-05-31 NOTE — Progress Notes (Signed)
Occupational Therapy Evaluation Patient Details Name: Charles Mccoy MRN: [id number] DOB: [birth year] Today's Date: 05/31/2016    History of Present Illness Pt s/p R TKR with hx of L TKR   Clinical Impression   Patient presents to OT with decreased ADL independence and safety due to the deficits listed below. He will benefit from skilled OT to maximize function and to facilitate a safe discharge. OT will follow.    Follow Up Recommendations  SNF    Equipment Recommendations  None recommended by OT    Recommendations for Other Services       Precautions / Restrictions Precautions Precautions: Knee;Fall Required Braces or Orthoses: Knee Immobilizer - Right Knee Immobilizer - Right: Discontinue once straight leg raise with < 10 degree lag Restrictions Weight Bearing Restrictions: No Other Position/Activity Restrictions: WBAT      Mobility Bed Mobility                  Transfers                      Balance                                            ADL Overall ADL's : Needs assistance/impaired Eating/Feeding: Independent;Set up;Sitting   Grooming: Wash/dry face;Wash/dry hands;Set up;Sitting   Upper Body Bathing: Set up;Sitting   Lower Body Bathing: Moderate assistance;Maximal assistance;Sit to/from stand   Upper Body Dressing : Minimal assistance;Sitting   Lower Body Dressing: Maximal assistance;Sit to/from stand       Toileting- Water quality scientist and Hygiene: Maximal assistance;Sit to/from stand Toileting - Clothing Manipulation Details (indicate cue type and reason): urinal use/hygiene       General ADL Comments: Patient up in chair; agreeable to evaluation. Patient demonstrated great difficulty reaching to lower legs/feet for LB ADLs. Patient also reports having some difficulty with urinal use. Did not wish to practice toilet transfer this date, but agreeable to it being part of his plan of care. OT will follow to  progress ADLs.     Vision     Perception     Praxis      Pertinent Vitals/Pain Pain Assessment: 0-10 Pain Score: 3  Pain Location: R knee Pain Descriptors / Indicators: Aching;Sore Pain Intervention(s): Limited activity within patient's tolerance;Monitored during session     Hand Dominance     Extremity/Trunk Assessment Upper Extremity Assessment Upper Extremity Assessment: Overall WFL for tasks assessed   Lower Extremity Assessment Lower Extremity Assessment: Defer to PT evaluation       Communication Communication Communication: No difficulties   Cognition Arousal/Alertness: Awake/alert Behavior During Therapy: WFL for tasks assessed/performed Overall Cognitive Status: Within Functional Limits for tasks assessed                     General Comments       Exercises       Shoulder Instructions      Home Living Family/patient expects to be discharged to:: Skilled nursing facility Living Arrangements: Spouse/significant other   Type of Home: House             Bathroom Shower/Tub: Occupational psychologist: Standard     Home Equipment: Environmental consultant - 2 wheels;Bedside commode;Cane - single point;Crutches   Additional Comments: Has 17 stairs to climb to access home      Prior  Functioning/Environment Level of Independence: Independent        Comments: works    OT Diagnosis: Acute pain   OT Problem List: Decreased strength;Decreased range of motion;Decreased activity tolerance;Decreased knowledge of use of DME or AE;Pain   OT Treatment/Interventions: Self-care/ADL training;DME and/or AE instruction;Therapeutic activities;Patient/family education    OT Goals(Current goals can be found in the care plan section) Acute Rehab OT Goals Patient Stated Goal: Regain IND OT Goal Formulation: With patient Time For Goal Achievement: 06/14/16 Potential to Achieve Goals: Good ADL Goals Pt Will Perform Lower Body Bathing: with supervision;sit  to/from stand Pt Will Perform Lower Body Dressing: with supervision;sit to/from stand Pt Will Transfer to Toilet: with supervision;ambulating;bedside commode Pt Will Perform Toileting - Clothing Manipulation and hygiene: with supervision;sit to/from stand Pt Will Perform Tub/Shower Transfer: Tub transfer;with min assist;rolling walker  OT Frequency: Min 2X/week   Barriers to D/C:            Co-evaluation              End of Session    Activity Tolerance: Patient tolerated treatment well Patient left: in chair;with call bell/phone within reach;with family/visitor present   Time: [id number] OT Time Calculation (min): 16 min Charges:  OT General Charges $OT Visit: 1 Procedure OT Evaluation $OT Eval Low Complexity: 1 Procedure G-Codes:    Kees Idrovo A 06/05/16, 2:07 PM

## 2016-06-01 LAB — BASIC METABOLIC PANEL
ANION GAP: 7 (ref 5–15)
BUN: 11 mg/dL (ref 6–20)
CHLORIDE: 94 mmol/L — AB (ref 101–111)
CO2: 27 mmol/L (ref 22–32)
Calcium: 8.8 mg/dL — ABNORMAL LOW (ref 8.9–10.3)
Creatinine, Ser: 0.85 mg/dL (ref 0.61–1.24)
GFR calc non Af Amer: >60 mL/min (ref >60)
Glucose, Bld: 153 mg/dL — ABNORMAL HIGH (ref 65–99)
POTASSIUM: 4.3 mmol/L (ref 3.5–5.1)
SODIUM: 128 mmol/L — AB (ref 135–145)

## 2016-06-01 LAB — CBC
HEMATOCRIT: 37.3 % — AB (ref 39.0–52.0)
HEMOGLOBIN: 13.1 g/dL (ref 13.0–17.0)
MCH: 28.3 pg (ref 26.0–34.0)
MCHC: 35.1 g/dL (ref 30.0–36.0)
MCV: 80.6 fL (ref 78.0–100.0)
Platelets: 213 10*3/uL (ref 150–400)
RBC: 4.63 MIL/uL (ref 4.22–5.81)
RDW: 13.6 % (ref 11.5–15.5)
WBC: 13.7 10*3/uL — AB (ref 4.0–10.5)

## 2016-06-01 MED ORDER — HYDROCODONE-ACETAMINOPHEN 10-325 MG PO TABS: 1.0000 | ORAL_TABLET | ORAL | 0 refills | Status: DC | PRN

## 2016-06-01 MED ORDER — HYDROMORPHONE HCL 2 MG PO TABS: 2.0000 mg | ORAL_TABLET | ORAL | 0 refills | Status: DC

## 2016-06-01 MED ORDER — HYDROCODONE-ACETAMINOPHEN 10-325 MG PO TABS
1.0000 | ORAL_TABLET | ORAL | Status: DC | PRN
Start: 2016-06-01 — End: 2016-06-01
  Administered 2016-06-01: 2 via ORAL
  Filled 2016-06-01: qty 2

## 2016-06-01 NOTE — Progress Notes (Addendum)
Patient discharged in care of wife via [facility] with destination of [place]. Verbal report given to facility supervisor, Melissa. Pt stable at time of discharge. NO distress. No shortness of breath. Denies pain. VSS. Discharge instructions also discussed with patient who states understanding. Prescriptions placed into discharge packet that include a Dilaudid and hydrocodone prescription. Informed Melissa that [facility] will need to obtain CBC, CMP from patient by Monday morning per Dr. Tonita Cong. States it will be ordered.

## 2016-06-01 NOTE — Care Management Note (Signed)
Case Management Note  Patient Details  Name: Charles Mccoy MRN: [id number] Date of Birth: [birth year]  Subjective/Objective:                  RIGHT TOTAL KNEE ARTHROPLASTY REPLACEMENT (Right) Action/Plan: Discharge planning Expected Discharge Date:                  Expected Discharge Plan:  [facility]  In-House Referral:     Discharge planning Services  CM Consult  Post Acute Care Choice:    Choice offered to:     DME Arranged:    DME Agency:     HH Arranged:    [facility] Agency:     Status of Service:  Completed, signed off  If discussed at [employer] [facility] [employer], dates discussed:    Additional Comments: CM notes pt to go to SNF; CSW aware and arranging.  No other CM needs were communicated. Dellie Catholic, RN 06/01/2016, 8:39 AM

## 2016-06-01 NOTE — Discharge Summary (Signed)
Physician Discharge Summary   Patient ID: Charles Mccoy MRN: [id number] DOB/AGE: Mar 05, 1951 65 y.o.  Admit date: 05/30/2016 Discharge date: 06/01/16  Primary Diagnosis: right knee primary osteoarthritis  Admission Diagnoses:  Past Medical History:  Diagnosis Date  . ALLERGIC RHINITIS 06/19/2007  . ASTHMA 08/17/2008  . Back pain 05/2016  . COPD (chronic obstructive pulmonary disease) ([place])   . GERD (gastroesophageal reflux disease)   . HYPERTENSION 06/19/2007  . Hypertension    meds controlling well  . OSTEOARTHRITIS 10/09/2009   wrists, knee.  . Pneumonia    hx of   . SINUSITIS 09/15/2007   recent issuses 2 weeks ago-clearing.   Discharge Diagnoses:   Principal Problem:   Primary osteoarthritis of right knee Active Problems:   Right knee DJD  Estimated body mass index is 29.34 kg/m as calculated from the following:   Height as of this encounter: _0  (1.93 m).   Weight as of this encounter: 109.3 kg (241 lb).  Procedure:  Procedure(s) (LRB): RIGHT TOTAL KNEE ARTHROPLASTY REPLACEMENT (Right)   Consults: None  HPI: see H&P Laboratory Data: Admission on 05/30/2016  Component Date Value Ref Range Status  . WBC 05/31/2016 11.3* 4.0 - 10.5 K/uL Final  . RBC 05/31/2016 5.11  4.22 - 5.81 MIL/uL Final  . Hemoglobin 05/31/2016 14.3  13.0 - 17.0 g/dL Final  . HCT 05/31/2016 42.3  39.0 - 52.0 % Final  . MCV 05/31/2016 82.8  78.0 - 100.0 fL Final  . MCH 05/31/2016 28.0  26.0 - 34.0 pg Final  . MCHC 05/31/2016 33.8  30.0 - 36.0 g/dL Final  . RDW 05/31/2016 13.6  11.5 - 15.5 % Final  . Platelets 05/31/2016 240  150 - 400 K/uL Final  . Sodium 05/31/2016 129* 135 - 145 mmol/L Final  . Potassium 05/31/2016 4.3  3.5 - 5.1 mmol/L Final  . Chloride 05/31/2016 92* 101 - 111 mmol/L Final  . CO2 05/31/2016 29  22 - 32 mmol/L Final  . Glucose, Bld 05/31/2016 155* 65 - 99 mg/dL Final  . BUN 05/31/2016 10  6 - 20 mg/dL Final  . Creatinine, Ser 05/31/2016 0.86  0.61 - 1.24 mg/dL Final  .  Calcium 05/31/2016 8.4* 8.9 - 10.3 mg/dL Final  . GFR calc non Af Amer 05/31/2016 >60  >60 mL/min Final  . GFR calc Af Amer 05/31/2016 >60  >60 mL/min Final   Comment: (NOTE) The eGFR has been calculated using the CKD EPI equation. This calculation has not been validated in all clinical situations. eGFR's persistently <60 mL/min signify possible Chronic Kidney Disease.   . Anion gap 05/31/2016 8  5 - 15 Final  . WBC 06/01/2016 13.7* 4.0 - 10.5 K/uL Final  . RBC 06/01/2016 4.63  4.22 - 5.81 MIL/uL Final  . Hemoglobin 06/01/2016 13.1  13.0 - 17.0 g/dL Final  . HCT 06/01/2016 37.3* 39.0 - 52.0 % Final  . MCV 06/01/2016 80.6  78.0 - 100.0 fL Final  . MCH 06/01/2016 28.3  26.0 - 34.0 pg Final  . MCHC 06/01/2016 35.1  30.0 - 36.0 g/dL Final  . RDW 06/01/2016 13.6  11.5 - 15.5 % Final  . Platelets 06/01/2016 213  150 - 400 K/uL Final  . Sodium 06/01/2016 128* 135 - 145 mmol/L Final  . Potassium 06/01/2016 4.3  3.5 - 5.1 mmol/L Final  . Chloride 06/01/2016 94* 101 - 111 mmol/L Final  . CO2 06/01/2016 27  22 - 32 mmol/L Final  . Glucose, Bld 06/01/2016 153* 65 -  99 mg/dL Final  . BUN 06/01/2016 11  6 - 20 mg/dL Final  . Creatinine, Ser 06/01/2016 0.85  0.61 - 1.24 mg/dL Final  . Calcium 06/01/2016 8.8* 8.9 - 10.3 mg/dL Final  . GFR calc non Af Amer 06/01/2016 >60  >60 mL/min Final  . GFR calc Af Amer 06/01/2016 >60  >60 mL/min Final   Comment: (NOTE) The eGFR has been calculated using the CKD EPI equation. This calculation has not been validated in all clinical situations. eGFR's persistently <60 mL/min signify possible Chronic Kidney Disease.   Georgiann Hahn gap 06/01/2016 7  5 - 15 Final  Hospital Outpatient Visit on 05/21/2016  Component Date Value Ref Range Status  . MRSA, PCR 05/21/2016 NEGATIVE  NEGATIVE Final  . Staphylococcus aureus 05/21/2016 NEGATIVE  NEGATIVE Final   Comment:        The Xpert SA Assay (FDA approved for NASAL specimens in patients over 21 years of age), is one  component of a comprehensive surveillance program.  Test performance has been validated by [facility] for patients greater than or equal to 105 year old. It is not intended to diagnose infection nor to guide or monitor treatment.   Marland Kitchen aPTT 05/21/2016 32  24 - 36 seconds Final  . Sodium 05/21/2016 137  135 - 145 mmol/L Final  . Potassium 05/21/2016 4.7  3.5 - 5.1 mmol/L Final  . Chloride 05/21/2016 105  101 - 111 mmol/L Final  . CO2 05/21/2016 27  22 - 32 mmol/L Final  . Glucose, Bld 05/21/2016 101* 65 - 99 mg/dL Final  . BUN 05/21/2016 17  6 - 20 mg/dL Final  . Creatinine, Ser 05/21/2016 0.99  0.61 - 1.24 mg/dL Final  . Calcium 05/21/2016 8.8* 8.9 - 10.3 mg/dL Final  . GFR calc non Af Amer 05/21/2016 >60  >60 mL/min Final  . GFR calc Af Amer 05/21/2016 >60  >60 mL/min Final   Comment: (NOTE) The eGFR has been calculated using the CKD EPI equation. This calculation has not been validated in all clinical situations. eGFR's persistently <60 mL/min signify possible Chronic Kidney Disease.   . Anion gap 05/21/2016 5  5 - 15 Final  . Prothrombin Time 05/21/2016 14.2  11.4 - 15.2 seconds Final  . INR 05/21/2016 1.09   Final  . ABO/RH(D) 05/30/2016 A POS   Final  . Antibody Screen 05/30/2016 NEG   Final  . Sample Expiration 05/30/2016 06/02/2016   Final  . Extend sample reason 05/30/2016 NO TRANSFUSIONS OR PREGNANCY IN THE PAST 3 MONTHS   Final  . Color, Urine 05/21/2016 YELLOW  YELLOW Final  . APPearance 05/21/2016 CLEAR  CLEAR Final  . Specific Gravity, Urine 05/21/2016 1.012  1.005 - 1.030 Final  . pH 05/21/2016 5.0  5.0 - 8.0 Final  . Glucose, UA 05/21/2016 NEGATIVE  NEGATIVE mg/dL Final  . Hgb urine dipstick 05/21/2016 NEGATIVE  NEGATIVE Final  . Bilirubin Urine 05/21/2016 NEGATIVE  NEGATIVE Final  . Ketones, ur 05/21/2016 NEGATIVE  NEGATIVE mg/dL Final  . Protein, ur 05/21/2016 NEGATIVE  NEGATIVE mg/dL Final  . Nitrite 05/21/2016 NEGATIVE  NEGATIVE Final  . Leukocytes, UA  05/21/2016 NEGATIVE  NEGATIVE Final  . WBC 05/21/2016 6.9  4.0 - 10.5 K/uL Final  . RBC 05/21/2016 5.11  4.22 - 5.81 MIL/uL Final  . Hemoglobin 05/21/2016 14.4  13.0 - 17.0 g/dL Final  . HCT 05/21/2016 42.4  39.0 - 52.0 % Final  . MCV 05/21/2016 83.0  78.0 - 100.0 fL Final  .  MCH 05/21/2016 28.2  26.0 - 34.0 pg Final  . MCHC 05/21/2016 34.0  30.0 - 36.0 g/dL Final  . RDW 57/84/6962 13.8  11.5 - 15.5 % Final  . Platelets 05/21/2016 230  150 - 400 K/uL Final     X-Rays:Dg Knee Right Port  Result Date: 05/30/2016 CLINICAL DATA:  Status post total knee replacement EXAM: PORTABLE RIGHT KNEE - 1-2 VIEW COMPARISON:  None. FINDINGS: Frontal and lateral views were obtained. The patient is status post total knee replacement with femoral and tibial prosthetic components appearing well-seated. No acute fracture or dislocation is evident. Air within the joint is an expected postoperative finding. There are foci of arterial vascular calcification. Skin staples are noted anteriorly. IMPRESSION: Total knee replacement prosthetic components appear well seated. No acute fracture or dislocation. There is arterial vascular calcification consistent with atherosclerosis. Extensive soft tissue air is an expected postoperative finding. Electronically Signed   By: Bretta Bang III M.D.   On: 05/30/2016 10:28    EKG: Orders placed or performed during the hospital encounter of 05/21/16  . EKG 12 lead  . EKG 12 lead     Hospital Course: Charles Mccoy is a 65 y.o. who was admitted to [facility]. They were brought to the operating room on 05/30/2016 and underwent Procedure(s): RIGHT TOTAL KNEE ARTHROPLASTY REPLACEMENT.  Patient tolerated the procedure well and was later transferred to the recovery room and then to the orthopaedic floor for postoperative care.  They were given PO and IV analgesics for pain control following their surgery.  They were given 24 hours of postoperative antibiotics of    Anti-infectives    Start     Dose/Rate Route Frequency Ordered Stop   05/30/16 1300  ceFAZolin (ANCEF) IVPB 2g/100 mL premix     2 g 200 mL/hr over 30 Minutes Intravenous Every 6 hours 05/30/16 1125 05/31/16 0228   05/30/16 0931  polymyxin B 500,000 Units, bacitracin 50,000 Units in sodium chloride irrigation 0.9 % 500 mL irrigation  Status:  Discontinued       As needed 05/30/16 0931 05/30/16 0955   05/30/16 0600  ceFAZolin (ANCEF) IVPB 2g/100 mL premix     2 g 200 mL/hr over 30 Minutes Intravenous On call to O.R. 05/30/16 9528 05/30/16 0729     and started on DVT prophylaxis in the form of Xarelto, TED hose and SCDs.   PT and OT were ordered for total joint protocol.  Discharge planning consulted to help with postop disposition and equipment needs.  Patient had a difficult night on the evening of surgery.  They started to get up OOB with therapy on day one. Continued to work with therapy into day two. By day two, the patient had progressed with therapy and meeting their goals.  Incision was healing well.  Patient was seen in rounds and was ready to go to [facility].   Diet: Regular diet Activity:WBAT Follow-up:in 10-14 days Disposition - Skilled nursing facility Discharged Condition: good   Discharge Instructions    Call MD / Call 911    Complete by:  As directed   If you experience chest pain or shortness of breath, CALL 911 and be transported to the hospital emergency room.  If you develope a fever above 101 F, pus (white drainage) or increased drainage or redness at the wound, or calf pain, call your surgeon's office.   Constipation Prevention    Complete by:  As directed   Drink plenty of fluids.  Prune  juice may be helpful.  You may use a stool softener, such as Colace (over the counter) 100 mg twice a day.  Use MiraLax (over the counter) for constipation as needed.   Diet - low sodium heart healthy    Complete by:  As directed   Increase activity slowly as tolerated    Complete  by:  As directed       Medication List    STOP taking these medications   celecoxib 200 MG capsule Commonly known as:  CELEBREX   TYLENOL ARTHRITIS PAIN 650 MG CR tablet Generic drug:  acetaminophen     TAKE these medications   albuterol 108 (90 Base) MCG/ACT inhaler Commonly known as:  PROVENTIL HFA;VENTOLIN HFA Inhale 2 puffs into the lungs every 6 (six) hours as needed for wheezing or shortness of breath.   amLODipine 10 MG tablet Commonly known as:  NORVASC TAKE 1 TABLET (10 MG TOTAL) BY MOUTH EVERY MORNING.   docusate sodium 100 MG capsule Commonly known as:  COLACE Take 1 capsule (100 mg total) by mouth 2 (two) times daily as needed for mild constipation.   Fluticasone-Salmeterol 250-50 MCG/DOSE Aepb Commonly known as:  ADVAIR Inhale 1 puff into the lungs 2 (two) times daily.   HYDROcodone-acetaminophen 10-325 MG tablet Commonly known as:  NORCO Take 1-2 tablets by mouth every 4 (four) hours as needed for moderate pain.   HYDROcodone-acetaminophen 10-325 MG tablet Commonly known as:  NORCO Take 1-2 tablets by mouth every 4 (four) hours as needed for severe pain.   HYDROmorphone 2 MG tablet Commonly known as:  DILAUDID Take 1-2 tablets (2-4 mg total) by mouth 1 day or 1 dose. For evening breakthrough pain only.   ketoconazole 2 % shampoo Commonly known as:  NIZORAL USE DAILY AS DIRECTED   methocarbamol 500 MG tablet Commonly known as:  ROBAXIN Take 1 tablet (500 mg total) by mouth every 6 (six) hours as needed for muscle spasms.   montelukast 10 MG tablet Commonly known as:  SINGULAIR TAKE 1 TABLET BY MOUTH AT BEDTIME   omeprazole 20 MG capsule Commonly known as:  PRILOSEC TAKE 1 CAPSULE (20 MG TOTAL) BY MOUTH EVERY MORNING.   polyethylene glycol packet Commonly known as:  MIRALAX / GLYCOLAX Take 17 g by mouth daily.   PRESCRIPTION MEDICATION Place 1 spray into the nose daily as needed (allergies.). Qnasal nasal spray   rivaroxaban 10 MG Tabs  tablet Commonly known as:  XARELTO Take 1 tablet (10 mg total) by mouth daily.   spironolactone 25 MG tablet Commonly known as:  ALDACTONE TAKE 1 TABLET EVERY DAY   vardenafil 20 MG tablet Commonly known as:  LEVITRA Take 20 mg by mouth daily as needed for erectile dysfunction.       Contact information for follow-up providers    BEANE,JEFFREY C, MD Follow up in 2 week(s).   Specialty:  Orthopedic Surgery Contact information: [address] [place] [address] [phone]        Johnn Hai, MD In 2 weeks.   Specialty:  Orthopedic Surgery Contact information: [address] [place] [address] [phone]            Contact information for after-discharge care    Destination    [facility] .   Specialty:  Skilled Nursing Engineer, manufacturing information: [place] [phone]  Signed: Lacie Draft, PA-C Orthopaedic Surgery 06/01/2016, 12:57 PM

## 2016-06-01 NOTE — Progress Notes (Signed)
Subjective: 2 Days Post-Op Procedure(s) (LRB): RIGHT TOTAL KNEE ARTHROPLASTY REPLACEMENT (Right) Patient reports pain as 4 on 0-10 scale.   Nausea from percocet. Objective: Vital signs in last 24 hours: Temp:  [98 F (36.7 C)-99.6 F (37.6 C)] 98.6 F (37 C) (08/26 0440) Pulse Rate:  [77-89] 77 (08/26 0440) Resp:  [14-20] 20 (08/26 0440) BP: (132-159)/(71-83) 132/71 (08/26 0440) SpO2:  [95 %-97 %] 96 % (08/26 0440)  Intake/Output from previous day: 08/25 0701 - 08/26 0700 In: 895 [P.O.:840; IV Piggyback:55] Out: 3350 [Urine:3350] Intake/Output this shift: No intake/output data recorded.   Recent Labs  05/31/16 0408 06/01/16 0418  HGB 14.3 13.1    Recent Labs  05/31/16 0408 06/01/16 0418  WBC 11.3* 13.7*  RBC 5.11 4.63  HCT 42.3 37.3*  PLT 240 213    Recent Labs  05/31/16 0408  NA 129*  K 4.3  CL 92*  CO2 29  BUN 10  CREATININE 0.86  GLUCOSE 155*  CALCIUM 8.4*   No results for input(s): LABPT, INR in the last 72 hours. Moderate swelling Neurologically intact Neurovascular intact Sensation intact distally Intact pulses distally Dorsiflexion/Plantar flexion intact Incision: dressing C/D/I Compartments soft. No pain with dorsiflexion foot or great toe up/down.  Assessment/Plan: 2 Days Post-Op Procedure(s) (LRB): RIGHT TOTAL KNEE ARTHROPLASTY REPLACEMENT (Right) Advance diet Up with therapy  D/c percocet change to norco. Elevate leg more frequently. Showed to patient. Alert oriented. Na 129. Off IV   Ison Wichmann C 06/01/2016, 8:11 AM

## 2016-06-01 NOTE — Clinical Social Work Placement (Signed)
   CLINICAL SOCIAL WORK PLACEMENT  NOTE  Date:  06/01/2016  Patient Details  Name: Charles Mccoy MRN: [id number] Date of Birth: Sep 01, 1951  Clinical Social Work is seeking post-discharge placement for this patient at the [place] level of care (*CSW will initial, date and re-position this form in  chart as items are completed):  No   Patient/family provided with [facility] Work Department's list of facilities offering this level of care within the geographic area requested by the patient (or if unable, by the patient's family).  Yes   Patient/family informed of their freedom to choose among providers that offer the needed level of care, that participate in Medicare, Medicaid or managed care program needed by the patient, have an available bed and are willing to accept the patient.  Yes   Patient/family informed of [facility]'s ownership interest in [facility] and [facility], as well as of the fact that they are under no obligation to receive care at these facilities.  PASRR submitted to EDS on       PASRR number received on       Existing PASRR number confirmed on 05/30/16     FL2 transmitted to all facilities in geographic area requested by pt/family on 05/30/16     FL2 transmitted to all facilities within larger geographic area on       Patient informed that his/her managed care company has contracts with or will negotiate with certain facilities, including the following:        Yes   Patient/family informed of bed offers received.  Patient chooses bed at [facility]     Physician recommends and patient chooses bed at      Patient to be transferred to [facility] on 06/01/16.  Patient to be transferred to facility by [facility] [place]     Patient family notified on 06/01/16 of transfer.  Name of family member notified:  Spouse     PHYSICIAN       Additional Comment: Pt / spouse are in agreement with plan to d/c to [facility] today. PT  approved transport by car. D/C Summary sent to SNF for review. Scripts included in d/c packet. # for report provided to nsg. D/C packet provided to pt.   _______________________________________________ [name], [place] 06/01/2016, 1:51 PM

## 2016-06-01 NOTE — Progress Notes (Signed)
Physical Therapy Treatment Patient Details Name: Charles Mccoy MRN: [id number] DOB: 1950/12/06 Today's Date: 06/01/2016    History of Present Illness Pt s/p R TKR with hx of L TKR    PT Comments    Pt progressing well with mobility and eager for dc to rehab setting.  Follow Up Recommendations  SNF     Equipment Recommendations  None recommended by PT    Recommendations for Other Services OT consult     Precautions / Restrictions Precautions Precautions: Knee;Fall Required Braces or Orthoses: Knee Immobilizer - Right Knee Immobilizer - Right: Discontinue once straight leg raise with < 10 degree lag Restrictions Weight Bearing Restrictions: No Other Position/Activity Restrictions: WBAT    Mobility  Bed Mobility Overal bed mobility: Needs Assistance Bed Mobility: Supine to Sit     Supine to sit: Min assist     General bed mobility comments: cues for sequence and use of L LE to self assist  Transfers Overall transfer level: Needs assistance Equipment used: Rolling walker (2 wheeled) Transfers: Sit to/from Stand Sit to Stand: Min guard         General transfer comment: cues for LE management and use of UEs to self assist  Ambulation/Gait Ambulation/Gait assistance: Min guard Ambulation Distance (Feet): 80 Feet (80' twice and 15' back from bathroom) Assistive device: Rolling walker (2 wheeled) Gait Pattern/deviations: Step-to pattern;Step-through pattern;Shuffle;Trunk flexed Gait velocity: decr Gait velocity interpretation: Below normal speed for age/gender General Gait Details: cues for sequence, posture and position from [employer]            Wheelchair Mobility    Modified Rankin (Stroke Patients Only)       Balance                                    Cognition Arousal/Alertness: Awake/alert Behavior During Therapy: WFL for tasks assessed/performed Overall Cognitive Status: Within Functional Limits for tasks assessed                      Exercises Total Joint Exercises Ankle Circles/Pumps: AROM;Both;15 reps;Supine Quad Sets: AROM;Both;Supine;15 reps Heel Slides: AAROM;Right;Supine;15 reps Straight Leg Raises: AAROM;Right;Supine;15 reps Goniometric ROM: AAROM R knee -10-  40 with muscle guarding    General Comments        Pertinent Vitals/Pain Pain Assessment: 0-10 Pain Score: 3  Pain Location: R knee Pain Descriptors / Indicators: Aching;Sore Pain Intervention(s): Limited activity within patient's tolerance;Premedicated before session;Monitored during session;Ice applied    Home Living                      Prior Function            PT Goals (current goals can now be found in the care plan section) Acute Rehab PT Goals Patient Stated Goal: Regain IND PT Goal Formulation: With patient Time For Goal Achievement: 06/03/16 Potential to Achieve Goals: Good Progress towards PT goals: Progressing toward goals    Frequency  7X/week    PT Plan Current plan remains appropriate    Co-evaluation             End of Session Equipment Utilized During Treatment: Gait belt;Right knee immobilizer Activity Tolerance: Patient tolerated treatment well Patient left: in chair;with call bell/phone within reach     Time: 1027-1108 PT Time Calculation (min) (ACUTE ONLY): 41 min  Charges:  $Gait Training: 8-22 mins $Therapeutic  Exercise: 8-22 mins $Therapeutic Activity: 8-22 mins                    G Codes:      Daren Yeagle 2016/06/20, 12:09 PM

## 2016-06-03 ENCOUNTER — Encounter: Payer: Self-pay | Admitting: Adult Health

## 2016-06-03 ENCOUNTER — Non-Acute Institutional Stay (SKILLED_NURSING_FACILITY): Payer: Managed Care, Other (non HMO) | Admitting: Adult Health

## 2016-06-03 DIAGNOSIS — E871 Hypo-osmolality and hyponatremia: Secondary | ICD-10-CM | POA: Diagnosis not present

## 2016-06-03 DIAGNOSIS — D72829 Elevated white blood cell count, unspecified: Secondary | ICD-10-CM

## 2016-06-03 DIAGNOSIS — I10 Essential (primary) hypertension: Secondary | ICD-10-CM

## 2016-06-03 DIAGNOSIS — M1711 Unilateral primary osteoarthritis, right knee: Secondary | ICD-10-CM | POA: Diagnosis not present

## 2016-06-03 DIAGNOSIS — J4520 Mild intermittent asthma, uncomplicated: Secondary | ICD-10-CM | POA: Diagnosis not present

## 2016-06-03 DIAGNOSIS — K219 Gastro-esophageal reflux disease without esophagitis: Secondary | ICD-10-CM

## 2016-06-03 DIAGNOSIS — R2681 Unsteadiness on feet: Secondary | ICD-10-CM

## 2016-06-03 DIAGNOSIS — K5901 Slow transit constipation: Secondary | ICD-10-CM

## 2016-06-03 LAB — BASIC METABOLIC PANEL
BUN: 15 mg/dL (ref 4–21)
CREATININE: 0.8 mg/dL (ref 0.6–1.3)
GLUCOSE: 114 mg/dL
Potassium: 4.3 mmol/L (ref 3.4–5.3)
Sodium: 134 mmol/L — AB (ref 137–147)

## 2016-06-03 LAB — CBC AND DIFFERENTIAL
HEMATOCRIT: 34 % — AB (ref 41–53)
Hemoglobin: 11.5 g/dL — AB (ref 13.5–17.5)
PLATELETS: 268 10*3/uL (ref 150–399)
WBC: 10.6 10*3/mL

## 2016-06-03 NOTE — Progress Notes (Signed)
Patient ID: Jeff Grube, male   DOB: 07-04-1951, 65 y.o.   MRN: [id number]    DATE:  06/03/2016   MRN:  [id number]  BIRTHDAY: Feb 24, 1951  Facility:  [facility]ursing Home Location:  [place] Room Number: 706-P  LEVEL OF CARE:  SNF [phone])  Contact Information    Name Relation Home Work Mobile   Angad, Roesel [phone]  [phone]   [place] Daughter [phone]         Code Status History    Date Active Date Inactive Code Status Order ID Comments User Context   05/30/2016 11:25 AM 06/01/2016  5:54 PM Full Code [id number]  Susa Day, MD Inpatient   07/21/2014 12:14 PM 07/25/2014  5:11 PM Full Code [id number]  Johnn Hai, MD Inpatient       Chief Complaint  Patient presents with  . Hospitalization Follow-up    HISTORY OF PRESENT ILLNESS:  This is a 65 year old male who has been admitted to [facility] on 06/01/16 from [facility] with Primary osteoarthritis of right knee S/P right total knee arthroplasty on 05/30/16. He has been admitted for a short-term rehabilitation.  He is seen in his room today and complains of constipation. He verbalized that Norco controls his pain better than Percocet.   PAST MEDICAL HISTORY:  Past Medical History:  Diagnosis Date  . ALLERGIC RHINITIS 06/19/2007  . ASTHMA 08/17/2008  . Back pain 05/2016  . COPD (chronic obstructive pulmonary disease) ([place])   . GERD (gastroesophageal reflux disease)   . HYPERTENSION 06/19/2007  . Hypertension    meds controlling well  . OSTEOARTHRITIS 10/09/2009   wrists, knee.  . Pneumonia    hx of   . SINUSITIS 09/15/2007   recent issuses 2 weeks ago-clearing.     CURRENT MEDICATIONS: Reviewed  Patient's Medications  New Prescriptions   No medications on file  Previous Medications   ACETAMINOPHEN (TYLENOL) 325 MG TABLET    Take 650 mg by mouth every 4 (four) hours as needed for mild pain.   ALBUTEROL (PROVENTIL HFA;VENTOLIN HFA) 108 (90 BASE)  MCG/ACT INHALER    Inhale 2 puffs into the lungs every 6 (six) hours as needed for wheezing or shortness of breath.    AMLODIPINE (NORVASC) 10 MG TABLET    TAKE 1 TABLET (10 MG TOTAL) BY MOUTH EVERY MORNING.   DOCUSATE SODIUM (COLACE) 100 MG CAPSULE    Take 1 capsule (100 mg total) by mouth 2 (two) times daily as needed for mild constipation.   FLUTICASONE-SALMETEROL (ADVAIR) 250-50 MCG/DOSE AEPB    Inhale 1 puff into the lungs 2 (two) times daily.    HYDROCODONE-ACETAMINOPHEN (NORCO) 10-325 MG TABLET    Take 1-2 tablets by mouth every 4 (four) hours as needed for moderate pain.   HYDROCODONE-ACETAMINOPHEN (NORCO) 10-325 MG TABLET    Take 1-2 tablets by mouth every 4 (four) hours as needed for severe pain.   KETOCONAZOLE (NIZORAL) 2 % SHAMPOO    USE DAILY AS DIRECTED   METHOCARBAMOL (ROBAXIN) 500 MG TABLET    Take 1 tablet (500 mg total) by mouth every 6 (six) hours as needed for muscle spasms.   MONTELUKAST (SINGULAIR) 10 MG TABLET    TAKE 1 TABLET BY MOUTH AT BEDTIME   OMEPRAZOLE (PRILOSEC) 20 MG CAPSULE    TAKE 1 CAPSULE (20 MG TOTAL) BY MOUTH EVERY MORNING.   POLYETHYLENE GLYCOL (MIRALAX / GLYCOLAX) PACKET    Take 17 g by mouth daily.  RIVAROXABAN (XARELTO) 10 MG TABS TABLET    Take 1 tablet (10 mg total) by mouth daily.   SPIRONOLACTONE (ALDACTONE) 25 MG TABLET    TAKE 1 TABLET EVERY DAY  Modified Medications   No medications on file  Discontinued Medications   HYDROMORPHONE (DILAUDID) 2 MG TABLET    Take 1-2 tablets (2-4 mg total) by mouth 1 day or 1 dose. For evening breakthrough pain only.   PRESCRIPTION MEDICATION    Place 1 spray into the nose daily as needed (allergies.). Qnasal nasal spray   VARDENAFIL (LEVITRA) 20 MG TABLET    Take 20 mg by mouth daily as needed for erectile dysfunction.     Allergies  Allergen Reactions  . Erythromycin Base     REACTION: stomach irritation     REVIEW OF SYSTEMS:  GENERAL: no change in appetite, no fatigue, no weight changes, no fever,  chills or weakness EYES: Denies change in vision, dry eyes, eye pain, itching or discharge EARS: Denies change in hearing, ringing in ears, or earache NOSE: Denies nasal congestion or epistaxis MOUTH and THROAT: Denies oral discomfort, gingival pain or bleeding, pain from teeth or hoarseness   RESPIRATORY: no cough, SOB, DOE, wheezing, hemoptysis CARDIAC: no chest pain, edema or palpitations GI: no abdominal pain, diarrhea, heart burn, nausea or vomiting, +CONSTIPATION GU: Denies dysuria, frequency, hematuria, incontinence, or discharge PSYCHIATRIC: Denies feeling of depression or anxiety. No report of hallucinations, insomnia, paranoia, or agitation   PHYSICAL EXAMINATION  GENERAL APPEARANCE: Well nourished. In no acute distress. Normal body habitus SKIN:  Right knee surgical incision is covered with Aquacel dressing, dry and no erythema HEAD: Normal in size and contour. No evidence of trauma EYES: Lids open and close normally. No blepharitis, entropion or ectropion. PERRL. Conjunctivae are clear and sclerae are white. Lenses are without opacity EARS: Pinnae are normal. Patient hears normal voice tunes of the examiner MOUTH and THROAT: Lips are without lesions. Oral mucosa is moist and without lesions. Tongue is normal in shape, size, and color and without lesions NECK: supple, trachea midline, no neck masses, no thyroid tenderness, no thyromegaly LYMPHATICS: no LAN in the neck, no supraclavicular LAN RESPIRATORY: breathing is even & unlabored, BS CTAB CARDIAC: RRR, no murmur,no extra heart sounds, no edema GI: abdomen soft, normal BS, no masses, no tenderness, no hepatomegaly, no splenomegaly EXTREMITIES:  Able to move 4 extremities PSYCHIATRIC: Alert and oriented X 3. Affect and behavior are appropriate  LABS/RADIOLOGY: Labs reviewed: Basic Metabolic Panel:  Recent Labs  05/21/16 1530 05/31/16 0408 06/01/16 0846  NA 137 129* 128*  K 4.7 4.3 4.3  CL 105 92* 94*  CO2 27 29  27   GLUCOSE 101* 155* 153*  BUN 17 10 11   CREATININE 0.99 0.86 0.85  CALCIUM 8.8* 8.4* 8.8*   Liver Function Tests:  Recent Labs  09/05/15 0911  AST 13  ALT 15  ALKPHOS 63  BILITOT 0.7  PROT 6.6  ALBUMIN 3.9   CBC:  Recent Labs  09/05/15 0911 05/21/16 1530 05/31/16 0408 06/01/16 0418  WBC 8.7 6.9 11.3* 13.7*  NEUTROABS 6.0  --   --   --   HGB 15.2 14.4 14.3 13.1  HCT 44.5 42.4 42.3 37.3*  MCV 84.3 83.0 82.8 80.6  PLT 257.0 230 240 213   Lipid Panel:  Recent Labs  09/05/15 0911  HDL 33.40*    Dg Knee Right Port  Result Date: 05/30/2016 CLINICAL DATA:  Status post total knee replacement EXAM: PORTABLE RIGHT KNEE -  1-2 VIEW COMPARISON:  None. FINDINGS: Frontal and lateral views were obtained. The patient is status post total knee replacement with femoral and tibial prosthetic components appearing well-seated. No acute fracture or dislocation is evident. Air within the joint is an expected postoperative finding. There are foci of arterial vascular calcification. Skin staples are noted anteriorly. IMPRESSION: Total knee replacement prosthetic components appear well seated. No acute fracture or dislocation. There is arterial vascular calcification consistent with atherosclerosis. Extensive soft tissue air is an expected postoperative finding. Electronically Signed   By: Lowella Grip III M.D.   On: 05/30/2016 10:28    ASSESSMENT/PLAN:  Unsteady gait - for rehabilitation, PT and OT, for therapeutic and strengthening exercises; fall precaution  Primary osteoarthritis of right knee S/P right total knee arthroplasty - for rehabilitation, PT and OT, for therapeutic strengthening exercises; recently discontinued Percocet and started on Norco 5/325 mg 1-2 tabs by mouth every 4 hours when necessary  for pain; Xarelto 10 mg 1 tab by mouth daily for DVT prophylaxis; Robaxin 500 mg 1 tab PO Q 6 hours PRN for muscle spasm;  RLE WBAT; follow-up with Dr. Susa Day, orthopedic  surgeon, in 2 weeks  Hypertension - continue Amlodipine 10 mg 1 tab daily and Spironolactone 25 mg 1 tab PO daily  Asthma - stable; continue Advair  250-50 mcg/dose AEPB 1 puff into lungs BID, Singulair 10 mg 1 tab PO Q HS PRN,   GERD - stable; continue Prilosec 20 mg 1 capsule PO Q D  Constipation - continue Miralax 17 gm PO Q D, discontinue Colace; start Senna-S 8.6-50 mg 2 tabs PO BID  Hyponatremia - NA 128; re-check BMP  Leukocytosis - no fever; re-check CBC Lab Results  Component Value Date   WBC 13.7 (H) 06/01/2016       Goals of care:  Short-term rehabilitation     Durenda Age, NP [place] [phone]

## 2016-06-04 ENCOUNTER — Non-Acute Institutional Stay (SKILLED_NURSING_FACILITY): Payer: Managed Care, Other (non HMO) | Admitting: Internal Medicine

## 2016-06-04 ENCOUNTER — Encounter: Payer: Self-pay | Admitting: Internal Medicine

## 2016-06-04 DIAGNOSIS — M1711 Unilateral primary osteoarthritis, right knee: Secondary | ICD-10-CM | POA: Diagnosis not present

## 2016-06-04 DIAGNOSIS — I10 Essential (primary) hypertension: Secondary | ICD-10-CM | POA: Diagnosis not present

## 2016-06-04 DIAGNOSIS — D62 Acute posthemorrhagic anemia: Secondary | ICD-10-CM | POA: Diagnosis not present

## 2016-06-04 DIAGNOSIS — R2681 Unsteadiness on feet: Secondary | ICD-10-CM

## 2016-06-04 DIAGNOSIS — K5900 Constipation, unspecified: Secondary | ICD-10-CM

## 2016-06-04 DIAGNOSIS — E871 Hypo-osmolality and hyponatremia: Secondary | ICD-10-CM

## 2016-06-04 DIAGNOSIS — K219 Gastro-esophageal reflux disease without esophagitis: Secondary | ICD-10-CM | POA: Diagnosis not present

## 2016-06-04 DIAGNOSIS — D72829 Elevated white blood cell count, unspecified: Secondary | ICD-10-CM | POA: Diagnosis not present

## 2016-06-04 DIAGNOSIS — J4520 Mild intermittent asthma, uncomplicated: Secondary | ICD-10-CM

## 2016-06-04 NOTE — Progress Notes (Signed)
LOCATION: [facility]  PCP: Nyoka Cowden, MD   Code Status: Full Code  Goals of care: Advanced Directive information Advanced Directives 05/30/2016  Does patient have an advance directive? Yes  Type of Advance Directive Living will  Does patient want to make changes to advanced directive? No - Patient declined  Copy of advanced directive(s) in chart? No - copy requested       Extended Emergency Contact Information Primary Emergency Contact: Aiken,Sarah W Address: [address]          [place] [address] [place] Phone: [phone] Mobile Phone: [phone] Relation: Spouse Secondary Emergency Contact: Vernelle [place] Phone: [phone] Relation: Daughter   Allergies  Allergen Reactions  . Erythromycin Base     REACTION: stomach irritation    Chief Complaint  Patient presents with  . New Admit To SNF    New Admission     HPI:  Patient is a 65 y.o. male seen today for short term rehabilitation post hospital admission from 05/30/16-06/02/16 with right knee OA. He underwent right total knee arthroplasty. He is seen in his room today with his wife present. He is upset regarding his pain meds. He feels that norco works better for his pain than percocet and would like this. He came to [place] from hospital on both meds which accidentally got discontinued and now he is in pain. He also has been constipated. He has noticed swelling to his surgical leg.   Review of Systems:  Constitutional: Negative for fever, chills, diaphoresis. Energy level is slowly coming back.  HENT: Negative for headache, congestion, nasal discharge, sore throat, difficulty swallowing.   Eyes: Negative for blurred vision, double vision and discharge.  Respiratory: Negative for cough, shortness of breath and wheezing.   Cardiovascular: Negative for chest pain, palpitation.  Gastrointestinal: Negative for heartburn, nausea, vomiting,  abdominal pain. Last bowel movement was on 05/29/16. Passing some flatus. Genitourinary: Negative for dysuria and flank pain.  Musculoskeletal: Negative for back pain, fall in the facility.  Skin: Negative for itching, rash.  Neurological: Negative for dizziness. Psychiatric/Behavioral: Negative for depression   Past Medical History:  Diagnosis Date  . ALLERGIC RHINITIS 06/19/2007  . ASTHMA 08/17/2008  . Back pain 05/2016  . COPD (chronic obstructive pulmonary disease) ([place])   . GERD (gastroesophageal reflux disease)   . HYPERTENSION 06/19/2007  . Hypertension    meds controlling well  . OSTEOARTHRITIS 10/09/2009   wrists, knee.  . Pneumonia    hx of   . SINUSITIS 09/15/2007   recent issuses 2 weeks ago-clearing.   Past Surgical History:  Procedure Laterality Date  . cyst removed from neck      age 80   . KNEE ARTHROSCOPY     2 on left knee , 1 on right  . TOTAL KNEE ARTHROPLASTY Left 07/21/2014   Procedure: LEFT TOTAL KNEE ARTHROPLASTY;  Surgeon: Johnn Hai, MD;  Location: [facility];  Service: Orthopedics;  Laterality: Left;  . TOTAL KNEE ARTHROPLASTY Right 05/30/2016   Procedure: RIGHT TOTAL KNEE ARTHROPLASTY REPLACEMENT;  Surgeon: Susa Day, MD;  Location: [facility];  Service: Orthopedics;  Laterality: Right;   Social History:   reports that he quit smoking about 14 years ago. His smoking use included Cigarettes. He has never used smokeless tobacco. He reports that he drinks about 3.6 oz of alcohol per week . He reports that he does not use drugs.  Family History  Problem Relation Age of  Onset  . Colon cancer Neg Hx   . Esophageal cancer Neg Hx   . Rectal cancer Neg Hx   . Stomach cancer Neg Hx     Medications:   Medication List       Accurate as of 06/04/16  2:09 PM. Always use your most recent med list.          albuterol 108 (90 Base) MCG/ACT inhaler Commonly known as:  PROVENTIL HFA;VENTOLIN HFA Inhale 2 puffs into the lungs every 6 (six) hours as needed  for wheezing or shortness of breath.   amLODipine 10 MG tablet Commonly known as:  NORVASC TAKE 1 TABLET (10 MG TOTAL) BY MOUTH EVERY MORNING.   docusate sodium 100 MG capsule Commonly known as:  COLACE Take 1 capsule (100 mg total) by mouth 2 (two) times daily as needed for mild constipation.   Fluticasone-Salmeterol 250-50 MCG/DOSE Aepb Commonly known as:  ADVAIR Inhale 1 puff into the lungs 2 (two) times daily.   HYDROcodone-acetaminophen 10-325 MG tablet Commonly known as:  NORCO Take 1-2 tablets by mouth every 4 (four) hours as needed for moderate pain.   ketoconazole 2 % shampoo Commonly known as:  NIZORAL USE DAILY AS DIRECTED   methocarbamol 500 MG tablet Commonly known as:  ROBAXIN Take 1 tablet (500 mg total) by mouth every 6 (six) hours as needed for muscle spasms.   montelukast 10 MG tablet Commonly known as:  SINGULAIR TAKE 1 TABLET BY MOUTH AT BEDTIME   omeprazole 20 MG capsule Commonly known as:  PRILOSEC TAKE 1 CAPSULE (20 MG TOTAL) BY MOUTH EVERY MORNING.   polyethylene glycol packet Commonly known as:  MIRALAX / GLYCOLAX Take 17 g by mouth daily.   rivaroxaban 10 MG Tabs tablet Commonly known as:  XARELTO Take 1 tablet (10 mg total) by mouth daily.   senna 8.6 MG tablet Commonly known as:  SENOKOT Take 2 tablets by mouth 2 (two) times daily.   spironolactone 25 MG tablet Commonly known as:  ALDACTONE TAKE 1 TABLET EVERY DAY       Immunizations: Immunization History  Administered Date(s) Administered  . Influenza Split 08/25/2012  . Influenza Whole 07/05/2008, 10/09/2009  . Influenza,inj,Quad PF,36+ Mos 09/06/2013, 09/12/2015  . Tdap 09/12/2015     Physical Exam:  Vitals:   06/04/16 1404  BP: 129/74  Pulse: 89  Resp: 15  Temp: 100 F (37.8 C)  TempSrc: Oral  SpO2: 94%  Weight: 241 lb (109.3 kg)  Height: 6\' 4"  (1.93 m)   Body mass index is 29.34 kg/m.  General- elderly male, well built, in no acute distress Head-  normocephalic, atraumatic Nose- no maxillary or frontal sinus tenderness, no nasal discharge Throat- moist mucus membrane  Eyes- PERRLA, EOMI, no pallor, no icterus, no discharge, normal conjunctiva, normal sclera Neck- no cervical lymphadenopathy Cardiovascular- normal s1,s2, no murmur, 1+ right leg edema and trace left leg edema Respiratory- bilateral clear to auscultation, no wheeze, no rhonchi, no crackles, no use of accessory muscles Abdomen- bowel sounds present, soft, non tender Musculoskeletal- able to move all 4 extremities, limited right knee range of motion, right knee swelling with erythema present, aquacel dressing to right knee incision Neurological- alert and oriented to person, place and time Skin- warm and dry, right leg and thigh bruise Psychiatry- normal mood and affect    Labs reviewed: Basic Metabolic Panel:  Recent Labs  05/21/16 1530 05/31/16 0408 06/01/16 0846 06/03/16  NA 137 129* 128* 134*  K 4.7 4.3 4.3  4.3  CL 105 92* 94*  --   CO2 27 29 27   --   GLUCOSE 101* 155* 153*  --   BUN 17 10 11 15   CREATININE 0.99 0.86 0.85 0.8  CALCIUM 8.8* 8.4* 8.8*  --    Liver Function Tests:  Recent Labs  09/05/15 0911  AST 13  ALT 15  ALKPHOS 63  BILITOT 0.7  PROT 6.6  ALBUMIN 3.9   No results for input(s): LIPASE, AMYLASE in the last 8760 hours. No results for input(s): AMMONIA in the last 8760 hours. CBC:  Recent Labs  09/05/15 0911 05/21/16 1530 05/31/16 0408 06/01/16 0418 06/03/16  WBC 8.7 6.9 11.3* 13.7* 10.6  NEUTROABS 6.0  --   --   --   --   HGB 15.2 14.4 14.3 13.1 11.5*  HCT 44.5 42.4 42.3 37.3* 34*  MCV 84.3 83.0 82.8 80.6  --   PLT 257.0 230 240 213 268    Radiological Exams: Dg Knee Right Port  Result Date: 05/30/2016 CLINICAL DATA:  Status post total knee replacement EXAM: PORTABLE RIGHT KNEE - 1-2 VIEW COMPARISON:  None. FINDINGS: Frontal and lateral views were obtained. The patient is status post total knee replacement with  femoral and tibial prosthetic components appearing well-seated. No acute fracture or dislocation is evident. Air within the joint is an expected postoperative finding. There are foci of arterial vascular calcification. Skin staples are noted anteriorly. IMPRESSION: Total knee replacement prosthetic components appear well seated. No acute fracture or dislocation. There is arterial vascular calcification consistent with atherosclerosis. Extensive soft tissue air is an expected postoperative finding. Electronically Signed   By: Lowella Grip III M.D.   On: 05/30/2016 10:28    Assessment/Plan  Gait instability With right knee surgery. Will have patient work with PT/OT as tolerated to regain strength and restore function.  Fall precautions are in place.  Right knee OA S/p right total knee arthroplasty. Has orthopedic follow up. Will have him work with physical therapy and occupational therapy team to help with gait training and muscle strengthening exercises.fall precautions. Skin care. Encourage to be out of bed. Start norco 5-325 mg 1-2 tab q4h prn pain. Continue robaxin 500 mg q6h prn muscle spasm. Continue xarelto 10 mg daily for DVT prophylaxis. RLE WBAT.   Blood loss anemia Post op, monitor cbc  Leukocytosis Resolved on repeat lab, monitor wbc curve  Constipation Currently on sennakot 2 tab bid and miralax bid with milk of magnesia and prune juice. D/c miralax. Start linzess 145 mcg daily and monitor. hydration encouraged  Hypertension continue spironolactone 25 mg daily and amlodipine 10 mg daily. Reviewed bmp. Check BP  Hyponatremia Improving, hydration to be maintained  gerd Continue prilosec and monitor  Asthma Stable with advair bid with singulair daily and prn albuterol   Goals of care: short term rehabilitation   Labs/tests ordered: cbc, bmp 1 week  Family/ staff Communication: reviewed care plan with patient, his wife and nursing supervisor    Blanchie Serve,  MD Internal Medicine [place],  [address] Cell Phone (Monday-Friday 8 am - 5 pm): [phone] On Call: [phone] and follow prompts after 5 pm and on weekends Office Phone: [phone] Office Fax: [phone]  \

## 2016-06-06 ENCOUNTER — Non-Acute Institutional Stay (SKILLED_NURSING_FACILITY): Payer: Managed Care, Other (non HMO) | Admitting: Adult Health

## 2016-06-06 ENCOUNTER — Encounter: Payer: Self-pay | Admitting: Adult Health

## 2016-06-06 DIAGNOSIS — L03115 Cellulitis of right lower limb: Secondary | ICD-10-CM

## 2016-06-06 DIAGNOSIS — K219 Gastro-esophageal reflux disease without esophagitis: Secondary | ICD-10-CM

## 2016-06-06 DIAGNOSIS — E871 Hypo-osmolality and hyponatremia: Secondary | ICD-10-CM | POA: Diagnosis not present

## 2016-06-06 DIAGNOSIS — D62 Acute posthemorrhagic anemia: Secondary | ICD-10-CM | POA: Diagnosis not present

## 2016-06-06 DIAGNOSIS — I10 Essential (primary) hypertension: Secondary | ICD-10-CM

## 2016-06-06 DIAGNOSIS — K5901 Slow transit constipation: Secondary | ICD-10-CM

## 2016-06-06 DIAGNOSIS — R2681 Unsteadiness on feet: Secondary | ICD-10-CM | POA: Diagnosis not present

## 2016-06-06 DIAGNOSIS — J4520 Mild intermittent asthma, uncomplicated: Secondary | ICD-10-CM | POA: Diagnosis not present

## 2016-06-06 DIAGNOSIS — M1711 Unilateral primary osteoarthritis, right knee: Secondary | ICD-10-CM | POA: Diagnosis not present

## 2016-06-06 NOTE — Progress Notes (Signed)
Patient ID: Charles Mccoy, male   DOB: 1950-11-26, 65 y.o.   MRN: [id number]     DATE:  06/06/16  MRN:  [id number]  BIRTHDAY: [birth year]  Facility:  Nursing Home Location:  [place] Room Number: 706-P  LEVEL OF CARE:  SNF [phone])  Contact Information    Name Relation Home Work Mobile   [place], [place] [phone]  [phone]   [place] Daughter [phone]         Code Status History    Date Active Date Inactive Code Status Order ID Comments User Context   05/30/2016 11:25 AM 06/01/2016  5:54 PM Full Code [id number]  Susa Day, MD Inpatient   07/21/2014 12:14 PM 07/25/2014  5:11 PM Full Code [id number]  Johnn Hai, MD Inpatient       Chief Complaint  Patient presents with  . Discharge Note    HISTORY OF PRESENT ILLNESS:  This is a 65 year old male who is for discharge home and will have outpatient rehabilitation. He was noted to have redness on his right leg, below his right knee. Skin is warm to touch. No fever has been reported.   He has been admitted to [facility] on 06/01/16 from [facility] with Primary osteoarthritis of right knee S/P right total knee arthroplasty on 05/30/16. He has been admitted for a short-term rehabilitation.  Patient was admitted to this facility for short-term rehabilitation after the patient's recent hospitalization.  Patient has completed SNF rehabilitation and therapy has cleared the patient for discharge.   PAST MEDICAL HISTORY:  Past Medical History:  Diagnosis Date  . ALLERGIC RHINITIS 06/19/2007  . ASTHMA 08/17/2008  . Back pain 05/2016  . COPD (chronic obstructive pulmonary disease) ([place])   . GERD (gastroesophageal reflux disease)   . HYPERTENSION 06/19/2007  . Hypertension    meds controlling well  . OSTEOARTHRITIS 10/09/2009   wrists, knee.  . Pneumonia    hx of   . SINUSITIS 09/15/2007   recent issuses 2 weeks ago-clearing.     CURRENT MEDICATIONS: Reviewed   Patient's Medications  New Prescriptions   No medications on file  Previous Medications   ALBUTEROL (PROVENTIL HFA;VENTOLIN HFA) 108 (90 BASE) MCG/ACT INHALER    Inhale 2 puffs into the lungs every 6 (six) hours as needed for wheezing or shortness of breath.    AMLODIPINE (NORVASC) 10 MG TABLET    TAKE 1 TABLET (10 MG TOTAL) BY MOUTH EVERY MORNING.   DOXYCYCLINE (VIBRA-TABS) 100 MG TABLET    Take 100 mg by mouth 2 (two) times daily.   FLUTICASONE-SALMETEROL (ADVAIR) 250-50 MCG/DOSE AEPB    Inhale 1 puff into the lungs 2 (two) times daily.    HYDROCODONE-ACETAMINOPHEN (NORCO) 10-325 MG TABLET    Take 1-2 tablets by mouth every 4 (four) hours as needed for moderate pain.   KETOCONAZOLE (NIZORAL) 2 % SHAMPOO    USE DAILY AS DIRECTED   LINACLOTIDE (LINZESS) 145 MCG CAPS CAPSULE    Take 145 mcg by mouth daily before breakfast.   METHOCARBAMOL (ROBAXIN) 500 MG TABLET    Take 1 tablet (500 mg total) by mouth every 6 (six) hours as needed for muscle spasms.   MONTELUKAST (SINGULAIR) 10 MG TABLET    TAKE 1 TABLET BY MOUTH AT BEDTIME   OMEPRAZOLE (PRILOSEC) 20 MG CAPSULE    TAKE 1 CAPSULE (20 MG TOTAL) BY MOUTH EVERY MORNING.   RIVAROXABAN (XARELTO) 10 MG TABS TABLET    Take 1 tablet (  10 mg total) by mouth daily.   SACCHAROMYCES BOULARDII (FLORASTOR) 250 MG CAPSULE    Take 250 mg by mouth 2 (two) times daily.   SENNA (SENOKOT) 8.6 MG TABLET    Take 2 tablets by mouth 2 (two) times daily.   SPIRONOLACTONE (ALDACTONE) 25 MG TABLET    TAKE 1 TABLET EVERY DAY  Modified Medications   No medications on file  Discontinued Medications   DOCUSATE SODIUM (COLACE) 100 MG CAPSULE    Take 1 capsule (100 mg total) by mouth 2 (two) times daily as needed for mild constipation.   POLYETHYLENE GLYCOL (MIRALAX / GLYCOLAX) PACKET    Take 17 g by mouth daily.     Allergies  Allergen Reactions  . Erythromycin Base     REACTION: stomach irritation     REVIEW OF SYSTEMS:  GENERAL: no change in appetite, no  fatigue, no weight changes, no fever, chills or weakness EYES: Denies change in vision, dry eyes, eye pain, itching or discharge EARS: Denies change in hearing, ringing in ears, or earache NOSE: Denies nasal congestion or epistaxis MOUTH and THROAT: Denies oral discomfort, gingival pain or bleeding, pain from teeth or hoarseness   RESPIRATORY: no cough, SOB, DOE, wheezing, hemoptysis CARDIAC: no chest pain, edema or palpitations GI: no abdominal pain, diarrhea, heart burn, nausea or vomiting GU: Denies dysuria, frequency, hematuria, incontinence, or discharge PSYCHIATRIC: Denies feeling of depression or anxiety. No report of hallucinations, insomnia, paranoia, or agitation   PHYSICAL EXAMINATION  GENERAL APPEARANCE: Well nourished. In no acute distress. Normal body habitus SKIN:  Right knee surgical incision is covered with Aquacel dressing, right shin noted to be red and warm to touch HEAD: Normal in size and contour. No evidence of trauma EYES: Lids open and close normally. No blepharitis, entropion or ectropion. PERRL. Conjunctivae are clear and sclerae are white. Lenses are without opacity EARS: Pinnae are normal. Patient hears normal voice tunes of the examiner MOUTH and THROAT: Lips are without lesions. Oral mucosa is moist and without lesions. Tongue is normal in shape, size, and color and without lesions NECK: supple, trachea midline, no neck masses, no thyroid tenderness, no thyromegaly LYMPHATICS: no LAN in the neck, no supraclavicular LAN RESPIRATORY: breathing is even & unlabored, BS CTAB CARDIAC: RRR, no murmur,no extra heart sounds, no edema GI: abdomen soft, normal BS, no masses, no tenderness, no hepatomegaly, no splenomegaly EXTREMITIES:  Able to move 4 extremities PSYCHIATRIC: Alert and oriented X 3. Affect and behavior are appropriate  LABS/RADIOLOGY: Labs reviewed: Basic Metabolic Panel:  Recent Labs  05/21/16 1530 05/31/16 0408 06/01/16 0846 06/03/16  NA 137  129* 128* 134*  K 4.7 4.3 4.3 4.3  CL 105 92* 94*  --   CO2 27 29 27   --   GLUCOSE 101* 155* 153*  --   BUN 17 10 11 15   CREATININE 0.99 0.86 0.85 0.8  CALCIUM 8.8* 8.4* 8.8*  --    Liver Function Tests:  Recent Labs  09/05/15 0911  AST 13  ALT 15  ALKPHOS 63  BILITOT 0.7  PROT 6.6  ALBUMIN 3.9   CBC:  Recent Labs  09/05/15 0911 05/21/16 1530 05/31/16 0408 06/01/16 0418 06/03/16  WBC 8.7 6.9 11.3* 13.7* 10.6  NEUTROABS 6.0  --   --   --   --   HGB 15.2 14.4 14.3 13.1 11.5*  HCT 44.5 42.4 42.3 37.3* 34*  MCV 84.3 83.0 82.8 80.6  --   PLT 257.0 230 240 213 268  Lipid Panel:  Recent Labs  09/05/15 0911  HDL 33.40*    Dg Knee Right Port  Result Date: 05/30/2016 CLINICAL DATA:  Status post total knee replacement EXAM: PORTABLE RIGHT KNEE - 1-2 VIEW COMPARISON:  None. FINDINGS: Frontal and lateral views were obtained. The patient is status post total knee replacement with femoral and tibial prosthetic components appearing well-seated. No acute fracture or dislocation is evident. Air within the joint is an expected postoperative finding. There are foci of arterial vascular calcification. Skin staples are noted anteriorly. IMPRESSION: Total knee replacement prosthetic components appear well seated. No acute fracture or dislocation. There is arterial vascular calcification consistent with atherosclerosis. Extensive soft tissue air is an expected postoperative finding. Electronically Signed   By: Lowella Grip III M.D.   On: 05/30/2016 10:28    ASSESSMENT/PLAN:  Unsteady gait - for outpatient care, for therapeutic and strengthening exercises; fall precaution  Primary osteoarthritis of right knee S/P right total knee arthroplasty - for outpatient care, for therapeutic strengthening exercises; continue Norco 5/325 mg 1-2 tabs by mouth every 4 hours when necessary  for pain; Xarelto 10 mg 1 tab by mouth daily for DVT prophylaxis; Robaxin 500 mg 1 tab PO Q 6 hours PRN for  muscle spasm;  RLE WBAT; follow-up with Dr. Susa Day, orthopedic surgeon  Cellulitis RLE - start Doxycycline 100 mg PO BID X 10 days and Florastor 250 mg 1 capsule PO BID X 13 days  Hypertension - continue Amlodipine 10 mg 1 tab daily and Spironolactone 25 mg 1 tab PO daily  Asthma - stable; continue Advair  250-50 mcg/dose AEPB 1 puff into lungs BID, Singulair 10 mg 1 tab PO Q HS PRN,   GERD - stable; continue Prilosec 20 mg 1 capsule PO Q D  Constipation - continue Senna-S 8.6-50 mg 2 tabs PO BID and Linzess 145 mcg 1 capsule PO daily  Hyponatremia - NA 128; re-check NA 134, better  Leukocytosis - resolved Lab Results  Component Value Date   WBC 10.6 06/03/2016   Anemia, acute blood loss - stable Lab Results  Component Value Date   HGB 11.5 (A) 06/03/2016        I have filled out patient's discharge paperwork and written prescriptions.  Patient will have outpatient care.  DME provided:  None  Total discharge time: Greater than 30 minutes Greater than 50% was spent in counseling and coordination of care with the patient.   Discharge time involved coordination of the discharge process with social worker, nursing staff and therapy department.     Durenda Age, NP [employer] [phone]

## 2016-06-13 ENCOUNTER — Other Ambulatory Visit (HOSPITAL_COMMUNITY): Payer: Self-pay | Admitting: Specialist

## 2016-06-13 ENCOUNTER — Ambulatory Visit (HOSPITAL_COMMUNITY)
Admission: RE | Admit: 2016-06-13 | Discharge: 2016-06-13 | Disposition: A | Discharge status: Home / Self Care | Payer: Managed Care, Other (non HMO) | Source: Ambulatory Visit | Attending: Cardiovascular Disease | Admitting: Cardiovascular Disease

## 2016-06-13 DIAGNOSIS — Z7901 Long term (current) use of anticoagulants: Secondary | ICD-10-CM | POA: Insufficient documentation

## 2016-06-13 DIAGNOSIS — M79604 Pain in right leg: Secondary | ICD-10-CM | POA: Insufficient documentation

## 2016-06-13 DIAGNOSIS — Z87891 Personal history of nicotine dependence: Secondary | ICD-10-CM | POA: Insufficient documentation

## 2016-06-13 DIAGNOSIS — M7989 Other specified soft tissue disorders: Secondary | ICD-10-CM

## 2016-06-13 DIAGNOSIS — I10 Essential (primary) hypertension: Secondary | ICD-10-CM | POA: Insufficient documentation

## 2016-06-13 DIAGNOSIS — E785 Hyperlipidemia, unspecified: Secondary | ICD-10-CM | POA: Diagnosis not present

## 2016-08-02 ENCOUNTER — Other Ambulatory Visit: Payer: Self-pay | Admitting: Internal Medicine

## 2016-08-09 ENCOUNTER — Telehealth: Payer: Self-pay | Admitting: Internal Medicine

## 2016-08-09 NOTE — Telephone Encounter (Signed)
Pt has met his deductible and would like to sch cpx before end of year. Can I create 30  Min slot?

## 2016-08-09 NOTE — Telephone Encounter (Signed)
Yes, if you can find one.

## 2016-08-12 NOTE — Telephone Encounter (Signed)
Pt has been sch

## 2016-09-06 ENCOUNTER — Other Ambulatory Visit (INDEPENDENT_AMBULATORY_CARE_PROVIDER_SITE_OTHER): Payer: Managed Care, Other (non HMO)

## 2016-09-06 DIAGNOSIS — Z0000 Encounter for general adult medical examination without abnormal findings: Secondary | ICD-10-CM

## 2016-09-06 LAB — CBC WITH DIFFERENTIAL/PLATELET
BASOS ABS: 0 10*3/uL (ref 0.0–0.1)
Basophils Relative: 0.4 % (ref 0.0–3.0)
EOS ABS: 0.4 10*3/uL (ref 0.0–0.7)
Eosinophils Relative: 5.9 % — ABNORMAL HIGH (ref 0.0–5.0)
HEMATOCRIT: 43 % (ref 39.0–52.0)
HEMOGLOBIN: 14.6 g/dL (ref 13.0–17.0)
LYMPHS PCT: 23.8 % (ref 12.0–46.0)
Lymphs Abs: 1.5 10*3/uL (ref 0.7–4.0)
MCHC: 34 g/dL (ref 30.0–36.0)
MCV: 81.3 fl (ref 78.0–100.0)
MONO ABS: 0.8 10*3/uL (ref 0.1–1.0)
Monocytes Relative: 12.6 % — ABNORMAL HIGH (ref 3.0–12.0)
Neutro Abs: 3.5 10*3/uL (ref 1.4–7.7)
Neutrophils Relative %: 57.3 % (ref 43.0–77.0)
PLATELETS: 256 10*3/uL (ref 150.0–400.0)
RBC: 5.29 Mil/uL (ref 4.22–5.81)
RDW: 15 % (ref 11.5–15.5)
WBC: 6.2 10*3/uL (ref 4.0–10.5)

## 2016-09-06 LAB — POC URINALSYSI DIPSTICK (AUTOMATED)
BILIRUBIN UA: NEGATIVE
Blood, UA: NEGATIVE
Glucose, UA: NEGATIVE
LEUKOCYTES UA: NEGATIVE
Nitrite, UA: NEGATIVE
PROTEIN UA: NEGATIVE
SPEC GRAV UA: 1.02
Urobilinogen, UA: 1
pH, UA: 6

## 2016-09-06 LAB — BASIC METABOLIC PANEL
BUN: 13 mg/dL (ref 6–23)
CALCIUM: 8.9 mg/dL (ref 8.4–10.5)
CO2: 28 mEq/L (ref 19–32)
CREATININE: 0.88 mg/dL (ref 0.40–1.50)
Chloride: 105 mEq/L (ref 96–112)
GFR: 92.83 mL/min (ref >60.00)
Glucose, Bld: 89 mg/dL (ref 70–99)
Potassium: 4.5 mEq/L (ref 3.5–5.1)
Sodium: 140 mEq/L (ref 135–145)

## 2016-09-06 LAB — HEPATIC FUNCTION PANEL
ALK PHOS: 54 U/L (ref 39–117)
ALT: 14 U/L (ref 0–53)
AST: 13 U/L (ref 0–37)
Albumin: 3.9 g/dL (ref 3.5–5.2)
BILIRUBIN DIRECT: 0.1 mg/dL (ref 0.0–0.3)
BILIRUBIN TOTAL: 0.4 mg/dL (ref 0.2–1.2)
TOTAL PROTEIN: 6.7 g/dL (ref 6.0–8.3)

## 2016-09-06 LAB — LIPID PANEL
CHOL/HDL RATIO: 4
Cholesterol: 169 mg/dL (ref 0–200)
HDL: 43.3 mg/dL (ref >39.00)
LDL CALC: 117 mg/dL — AB (ref 0–99)
NONHDL: 125.96
TRIGLYCERIDES: 44 mg/dL (ref 0.0–149.0)
VLDL: 8.8 mg/dL (ref 0.0–40.0)

## 2016-09-06 LAB — PSA: PSA: 0.74 ng/mL (ref 0.10–4.00)

## 2016-09-06 LAB — TSH: TSH: 1.28 u[IU]/mL (ref 0.35–4.50)

## 2016-09-13 ENCOUNTER — Encounter: Payer: Self-pay | Admitting: Internal Medicine

## 2016-09-13 ENCOUNTER — Ambulatory Visit (INDEPENDENT_AMBULATORY_CARE_PROVIDER_SITE_OTHER): Payer: Managed Care, Other (non HMO) | Admitting: Internal Medicine

## 2016-09-13 VITALS — BP 160/82 | HR 66 | Temp 98.2°F | Ht 76.5 in | Wt 242.8 lb

## 2016-09-13 DIAGNOSIS — Z0000 Encounter for general adult medical examination without abnormal findings: Secondary | ICD-10-CM | POA: Diagnosis not present

## 2016-09-13 MED ORDER — TAMSULOSIN HCL 0.4 MG PO CAPS: 0.4000 mg | ORAL_CAPSULE | Freq: Every day | ORAL | 3 refills | Status: DC

## 2016-09-13 NOTE — Progress Notes (Signed)
   Subjective:    Patient ID: Charles Mccoy, male    DOB: 1951/04/05, 65 y.o.   MRN: [id number]  HPI  65 year old patient who is seen today for a preventive health examination.  Medical problems include history of hypertension.  He is on combination therapy.  He also has a history of asthma, which has been stable.  He is on maintenance medications and has been on immunotherapy in the past He has had recent right total knee replacement surgery.  Allergies:  1) ! Ery-Tab (Erythromycin Base)   Past History:   Allergic rhinitis  Hypertension  Asthma ( Immunotherapy)  Osteoarthritis    Past Surgical History:    colonoscopy January 2007 2015   Family History:   father died at age 27 laryngeal cancer  mother died age 106  One older brother and sister are both in good health   Social History:   married two adult children are in good health  Works in the Engineer, maintenance (IT) as a Soil scientist for [employer]    Review of Systems  Constitutional: Negative for appetite change, chills, fatigue and fever.  HENT: Negative for congestion, dental problem, ear pain, hearing loss, sore throat, tinnitus, trouble swallowing and voice change.   Eyes: Negative for pain, discharge and visual disturbance.  Respiratory: Negative for cough, chest tightness, wheezing and stridor.   Cardiovascular: Negative for chest pain, palpitations and leg swelling.  Gastrointestinal: Negative for abdominal distention, abdominal pain, blood in stool, constipation, diarrhea, nausea and vomiting.  Genitourinary: Positive for frequency. Negative for difficulty urinating, discharge, flank pain, genital sores, hematuria and urgency.  Musculoskeletal: Negative for arthralgias, back pain, gait problem, joint swelling, myalgias and neck stiffness.  Skin: Negative for rash.  Neurological: Negative for dizziness, syncope, speech difficulty, weakness, numbness and headaches.  Hematological: Negative for adenopathy. Does not bruise/bleed easily.   Psychiatric/Behavioral: Negative for behavioral problems and dysphoric mood. The patient is not nervous/anxious.        Objective:   Physical Exam  Constitutional: He is oriented to person, place, and time. He appears well-developed.  Blood pressure 138/76  HENT:  Head: Normocephalic.  Right Ear: External ear normal.  Left Ear: External ear normal.  Eyes: Conjunctivae and EOM are normal.  Neck: Normal range of motion.  Cardiovascular: Normal rate and normal heart sounds.   Pulmonary/Chest: Breath sounds normal.  Abdominal: Bowel sounds are normal.  Genitourinary: Prostate normal. Rectal exam shows guaiac negative stool.  Genitourinary Comments: Plus 2 enlarged  Musculoskeletal: Normal range of motion. He exhibits no edema or tenderness.  Bilateral total knee replacement surgeries  Neurological: He is alert and oriented to person, place, and time.  Psychiatric: He has a normal mood and affect. His behavior is normal.          Assessment & Plan:   Preventive health exam Essential hypertension.  Will continue home blood pressure monitoring.  Low-salt diet Asthma, stable Osteoarthritis status post bilateral total knee replacement surgeries  Follow-up 6 month or as needed  Nyoka Cowden

## 2016-09-13 NOTE — Progress Notes (Signed)
Pre visit review using our clinic review tool, if applicable. No additional management support is needed unless otherwise documented below in the visit note. 

## 2016-09-13 NOTE — Patient Instructions (Signed)
Limit your sodium (Salt) intake  Please check your blood pressure on a regular basis.  If it is consistently greater than 140/90, please make an office appointment.    It is important that you exercise regularly, at least 20 minutes 3 to 4 times per week.  If you develop chest pain or shortness of breath seek  medical attention.     

## 2016-12-03 ENCOUNTER — Ambulatory Visit: Payer: Worker's Compensation | Attending: Specialist | Admitting: Physical Therapy

## 2016-12-03 DIAGNOSIS — R2232 Localized swelling, mass and lump, left upper limb: Secondary | ICD-10-CM | POA: Diagnosis present

## 2016-12-03 NOTE — Therapy (Signed)
[place], [place], [address] Phone: [phone]   Fax:  [phone]  Physical Therapy Evaluation  Patient Details  Name: Charles Mccoy MRN: [id number] Date of Birth: 1951-02-12 Referring Provider: Lacie Draft   Encounter Date: 12/03/2016      PT End of Session - 12/03/16 1752    Visit Number 1   Number of Visits 13   Date for PT Re-Evaluation 12/31/16   PT Start Time 1500   PT Stop Time 1600   PT Time Calculation (min) 60 min   Activity Tolerance Patient tolerated treatment well   Behavior During Therapy [facility] for tasks assessed/performed      Past Medical History:  Diagnosis Date  . ALLERGIC RHINITIS 06/19/2007  . ASTHMA 08/17/2008  . Back pain 05/2016  . COPD (chronic obstructive pulmonary disease) ([place])   . GERD (gastroesophageal reflux disease)   . HYPERTENSION 06/19/2007  . Hypertension    meds controlling well  . OSTEOARTHRITIS 10/09/2009   wrists, knee.  . Pneumonia    hx of   . SINUSITIS 09/15/2007   recent issuses 2 weeks ago-clearing.    Past Surgical History:  Procedure Laterality Date  . cyst removed from neck      age 66   . KNEE ARTHROSCOPY     2 on left knee , 1 on right  . TOTAL KNEE ARTHROPLASTY Left 07/21/2014   Procedure: LEFT TOTAL KNEE ARTHROPLASTY;  Surgeon: Johnn Hai, MD;  Location: [facility];  Service: Orthopedics;  Laterality: Left;  . TOTAL KNEE ARTHROPLASTY Right 05/30/2016   Procedure: RIGHT TOTAL KNEE ARTHROPLASTY REPLACEMENT;  Surgeon: Susa Day, MD;  Location: [facility];  Service: Orthopedics;  Laterality: Right;    There were no vitals filed for this visit.       Subjective Assessment - 12/03/16 1737    Subjective "I just saw Dr. Tonita Cong" and he wants to have something done about this swelling.    Patient is accompained by: Family member   Pertinent History 71  man fell off a ladder at work and sustained fracture of left humerus on Feb. 9.  He has  been treated with immobilization in a sling and had pitting edema in left forearm and around elbow . Pt states he had some problems getting rid of the swelling in his right leg after a knee replacement.    Patient Stated Goals to get rid of the swelling   Currently in Pain? No/denies            [facility] PT Assessment - 12/03/16 0001      Assessment   Medical Diagnosis Left humeral fracture    Referring Provider Lacie Draft    Onset Date/Surgical Date 11/15/16   Hand Dominance Left   Prior Therapy none      Precautions   Precautions Other (comment)   Precaution Comments no shoulder movement or weight bearing    Required Braces or Orthoses Sling     Restrictions   Weight Bearing Restrictions No     Balance Screen   Has the patient fallen in the past 6 months Yes   How many times? 1   Has the patient had a decrease in activity level because of a fear of falling?  No   Is the patient reluctant to leave their home because of a fear of falling?  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 fading yellow ecchymosis around left deltoid area with visible fullness at upper arm and also forearm and posterior elbow     Skin Integrity no open area      Sensation   Light Touch Appears Intact     ROM / Strength   AROM / PROM / Strength AROM     AROM   Overall AROM Comments did not test left shoulder due to restrictions. Pt able to moved left elbow into flexion  and extension is limited by swelling, as is forearm supination. otherwise within normal limits in left wrist and hand      Palpation   Palpation comment pitting edema in left dorsal forearm and around olecranon             LYMPHEDEMA/ONCOLOGY QUESTIONNAIRE - 12/03/16 1523      What other symptoms do you have   Are you Having Heaviness or Tightness Yes   Are you having Pain No   Are you having pitting edema Yes   Body Site --  forearm    Is it Hard or Difficult finding clothes that fit Yes  took ring off    Do you have infections No   [place]mer Sign No     Lymphedema Stage   Stage STAGE 2 SPONTANEOUSLY IRREVERSIBLE     Right Upper Extremity Lymphedema   15 cm Proximal to Olecranon Process 35 cm   10 cm Proximal to Olecranon Process 30 cm   Olecranon Process 28.5 cm   15 cm Proximal to Ulnar Styloid Process 24.9 cm   10 cm Proximal to Ulnar Styloid Process 21 cm   Just Proximal to Ulnar Styloid Process 18.7 cm   Across Hand at [employer] 23 cm   At [place] of 2nd Digit 7.5 cm     Left Upper Extremity Lymphedema   15 cm Proximal to Olecranon Process 35.5 cm  20 cm prox to olecranon   10 cm Proximal to Olecranon Process 32.3 cm   Olecranon Process 33 cm  edema around olecranon   15 cm Proximal to Ulnar Styloid Process 30.5 cm   10 cm Proximal to Ulnar Styloid Process 25.5 cm   Just Proximal to Ulnar Styloid Process 19.5 cm   Across Hand at [employer] 23 cm   At [place] of 2nd Digit 7.5 cm                [facility] Adult PT Treatment/Exercise - 12/03/16 0001      Manual Therapy   Manual Therapy Compression Bandaging   Compression Bandaging biotone to left arm, one elastomull to fingere 1-4, 2 artiflex with thin foam at posterior elbow, 1-8, 1-10 and 2 -12 cm bandages with one in herringbone at forearm. pt wrapped  to allow positiioning of arm in sling.                 PT Education - 12/03/16 1750    Education provided Yes   Education Details see pt  instructions sections.  bring  all bandages back if they come off.  try to move your forearm and hand in bandages and elevate arm if able    Person(s) Educated Patient;Spouse   Methods Explanation;Demonstration   Comprehension Verbalized understanding                Long Term Clinic Goals - 12/03/16 1759      CC Long Term Goal  #1   Title Pt and family will be independent in manual lymph drainage and compression bandaging to control swelling of left arm.   Time 4   Period Weeks   Status New     CC Long Term Goal  #2   Title Pt will have a decreased in circumferential measurement of left arm at 10 cm proximal to ulnar styloid to 23 cm    Baseline 25.5 on 12/04/2016   Time 4   Period Weeks   Status New     CC Long Term Goal  #3   Title Pt will have decrease in circumference around the olecranon process of left arm to 30 cm    Baseline 33 cm on 2//11/14/2016            Plan - 12/03/16 1753    Clinical Impression Statement Pt presents with pitting edema in left UE after a humeral fracture.  Anticipate he will respond well to compression bandaging.  Wife is present and interested in learning how to do manual lymph drainage and compression bandaging to care for pt at home. Due to lack of comorbidities, this is a low complexity eval, but pt did recieve treatment during this session    Rehab Potential Good   Clinical Impairments Affecting Rehab Potential healing humeral fracture    PT Frequency 3x / week   PT Duration 4 weeks   PT Treatment/Interventions ADLs/Self Care Home Management;Patient/family education;Taping;DME Instruction;Manual techniques;Therapeutic activities;Manual lymph drainage;Compression bandaging;Therapeutic exercise   PT Next Visit Plan remeasure after initial bandaging, perform and teach manual lymph drainage and compression bandaging, later, consider compression garment if needed    Consulted and Agree with Plan of Care Patient;Family member/caregiver    Family Member Consulted wife       Patient will benefit from skilled therapeutic intervention in order to improve the following deficits and impairments:  Decreased range of motion, Pain, Impaired UE functional use, Decreased knowledge of use of DME, Decreased knowledge of precautions, Increased edema  Visit Diagnosis: Localized swelling, mass and lump, left upper limb     Problem List Patient Active Problem List   Diagnosis Date Noted  . Primary osteoarthritis of right knee 05/30/2016  . Right knee DJD 05/30/2016  . GERD (gastroesophageal reflux disease) 07/28/2014  . Left knee DJD 07/21/2014  . Osteoarthritis 10/09/2009  . Asthma 08/17/2008  . SINUSITIS 09/15/2007  . Essential hypertension 06/19/2007  . Allergic rhinitis 06/19/2007   Donato Heinz. Owens Shark PT  [place] Levo 12/03/2016, 6:04 PM  [place], [place], [address] Phone: [phone]   Fax:  [phone]  Name: Charles Mccoy MRN: [id number] Date of Birth: Aug 10, 1951

## 2016-12-03 NOTE — Patient Instructions (Signed)

## 2016-12-04 ENCOUNTER — Ambulatory Visit: Payer: Worker's Compensation | Attending: Specialist | Admitting: Physical Therapy

## 2016-12-04 DIAGNOSIS — R2232 Localized swelling, mass and lump, left upper limb: Secondary | ICD-10-CM | POA: Diagnosis not present

## 2016-12-04 NOTE — Therapy (Signed)
[place], [place], [address] Phone: [phone]   Fax:  [phone]  Physical Therapy Treatment  Patient Details  Name: Charles Mccoy MRN: [id number] Date of Birth: January 10, 1951 Referring Provider: Lacie Draft   Encounter Date: 12/04/2016      PT End of Session - 12/04/16 1303    Visit Number 2   Number of Visits 13   Date for PT Re-Evaluation 12/31/16   PT Start Time 1107   PT Stop Time 1210   PT Time Calculation (min) 63 min   Activity Tolerance Patient tolerated treatment well   Behavior During Therapy [facility] for tasks assessed/performed      Past Medical History:  Diagnosis Date  . ALLERGIC RHINITIS 06/19/2007  . ASTHMA 08/17/2008  . Back pain 05/2016  . COPD (chronic obstructive pulmonary disease) ([place])   . GERD (gastroesophageal reflux disease)   . HYPERTENSION 06/19/2007  . Hypertension    meds controlling well  . OSTEOARTHRITIS 10/09/2009   wrists, knee.  . Pneumonia    hx of   . SINUSITIS 09/15/2007   recent issuses 2 weeks ago-clearing.    Past Surgical History:  Procedure Laterality Date  . cyst removed from neck      age 70   . KNEE ARTHROSCOPY     2 on left knee , 1 on right  . TOTAL KNEE ARTHROPLASTY Left 07/21/2014   Procedure: LEFT TOTAL KNEE ARTHROPLASTY;  Surgeon: Johnn Hai, MD;  Location: [facility];  Service: Orthopedics;  Laterality: Left;  . TOTAL KNEE ARTHROPLASTY Right 05/30/2016   Procedure: RIGHT TOTAL KNEE ARTHROPLASTY REPLACEMENT;  Surgeon: Susa Day, MD;  Location: [facility];  Service: Orthopedics;  Laterality: Right;    There were no vitals filed for this visit.      Subjective Assessment - 12/04/16 1227    Subjective Pt comes in with bandages intact.  He says the bandages got really hot and were uncomfortable  but he was able to tolerate them.   Patient is accompained by: Family member   Pertinent History 66  man fell off a ladder at work and  sustained fracture of left humerus on Feb. 9.  He has been treated with immobilization in a sling and had pitting edema in left forearm and around elbow . Pt states he had some problems getting rid of the swelling in his right leg after a knee replacement.    Limitations Other (comment)  NO ROM of SHOULDER    Patient Stated Goals to get rid of the swelling   Currently in Pain? No/denies               LYMPHEDEMA/ONCOLOGY QUESTIONNAIRE - 12/04/16 1110      Left Upper Extremity Lymphedema   15 cm Proximal to Olecranon Process 34.7 cm   10 cm Proximal to Olecranon Process 30.3 cm   Olecranon Process 31.3 cm   15 cm Proximal to Ulnar Styloid Process 29 cm   10 cm Proximal to Ulnar Styloid Process 25.5 cm   Just Proximal to Ulnar Styloid Process 19 cm   Across Hand at [employer] 22.5 cm   At [place] of 2nd Digit 7.1 cm                  [facility] Adult PT Treatment/Exercise - 12/04/16 0001      Manual Therapy   Manual Therapy Manual Lymphatic Drainage (MLD);Compression Bandaging   Manual therapy comments wife filmed MLD and  compression bandaging on her phone so that she could follow up at home.    Manual Lymphatic Drainage (MLD) pt in supine with left arm supported on pillow with forearm on abdomen  diaphragmatic breathing,  short neck, posterior shoulder collectors, right axillary nodes, anterior interaxillary anastamosis,  left inguinal nodes and left interaxillary anastamosis, left shoulder upper arm , forearm wrist, and hand with return along pathways,  Instructed wife verbally, with handout, and with hand over hand instruction.  Then to sidelying with UE gently assited with support on pillow for access to posterior upper arm and posterior interaxillay anastamosis    Compression Bandaging biotone to left arm, one elastomull to fingere 1-4, 2 artiflex with thin foam at posterior elbow and medial upper arm,  1-8, 1-10 and 2 -12 cm bandages with one in herringbone at forearm. pt  wrapped  to allow positiioning of arm in sling.                 PT Education - 12/04/16 1302    Education provided Yes   Education Details manual lymph drainage   Person(s) Educated Patient;Spouse   Methods Explanation;Demonstration;Handout;Verbal cues;Tactile cues   Comprehension Verbalized understanding;Returned demonstration;Need further instruction                Long Term Clinic Goals - 12/03/16 1759      CC Long Term Goal  #1   Title Pt and family will be independent in manual lymph drainage and compression bandaging to control swelling of left arm.   Time 4   Period Weeks   Status New     CC Long Term Goal  #2   Title Pt will have a decreased in circumferential measurement of left arm at 10 cm proximal to ulnar styloid to 23 cm    Baseline 25.5 on 12/04/2016   Time 4   Period Weeks   Status New     CC Long Term Goal  #3   Title Pt will have decrease in circumference around the olecranon process of left arm to 30 cm    Baseline 33 cm on 2//11/14/2016            Plan - 12/04/16 1303    Clinical Impression Statement Pt will good intital reduction with less pitting edema. Palpale fullness still at medial distal upper arm and posterior elbow.  Pt and wife pleased with progress and feel they will be able to follow up at home after more instruction. Extra foam placed in bandages today at medial upper arm.    Rehab Potential Good   Clinical Impairments Affecting Rehab Potential healing humeral fracture  NO ROM of SHOULDER yer    PT Frequency 3x / week   PT Duration 4 weeks   PT Treatment/Interventions ADLs/Self Care Home Management;Patient/family education;Taping;DME Instruction;Manual techniques;Therapeutic activities;Manual lymph drainage;Compression bandaging;Therapeutic exercise   PT Next Visit Plan  perform and teach manual lymph drainage and compression bandaging, later, consider compression garment if needed  remeasure as needed.    Consulted and Agree  with Plan of Care Patient;Family member/caregiver   Family Member Consulted wife       Patient will benefit from skilled therapeutic intervention in order to improve the following deficits and impairments:     Visit Diagnosis: Localized swelling, mass and lump, left upper limb     Problem List Patient Active Problem List   Diagnosis Date Noted  . Primary osteoarthritis of right knee 05/30/2016  . Right knee DJD 05/30/2016  .  GERD (gastroesophageal reflux disease) 07/28/2014  . Left knee DJD 07/21/2014  . Osteoarthritis 10/09/2009  . Asthma 08/17/2008  . SINUSITIS 09/15/2007  . Essential hypertension 06/19/2007  . Allergic rhinitis 06/19/2007   Charles Mccoy PT  Charles Mccoy 12/04/2016, 1:09 PM  Charles Mccoy [place], [place], [address] Phone: [phone]   Fax:  [phone]  Name: Charles Mccoy MRN: [id number] Date of Birth: 22-Sep-1951

## 2016-12-04 NOTE — Patient Instructions (Addendum)
Deep Effective Breath   Standing, sitting, or laying down, place both hands on the belly. Take a deep breath IN, expanding the belly; then breath OUT, contracting the belly. Repeat __5__ times. Do __2-3__ sessions per day and before your self massage.  http://gt2.exer.us/866   Copyright  VHI. All rights reserved.  Axilla to Axilla - Sweep   On uninvolved side make 5 circles in the armpit, then pump _5__ times from involved armpit across chest to uninvolved armpit, making a pathway. Do _1__ time per day.  Copyright  VHI. All rights reserved.  Axilla to Inguinal Nodes - Sweep   On involved side, make 5 circles at groin at panty line, then pump _5__ times from armpit along side of trunk to outer hip, making your other pathway. Do __1_ time per day.  Copyright  VHI. All rights reserved.  Arm Posterior: Elbow to Shoulder - Sweep   Pump _5__ times from back of elbow to top of shoulder. Then inner to outer upper arm _5_ times, then outer arm again _5_ times. Then back to the pathways _2-3_ times. Do _1__ time per day.  Copyright  VHI. All rights reserved.  ARM: [place] Wrist to Elbow - Sweep   Pump or stationary circles _5__ times from wrist to elbow making sure to do both sides of the forearm. Then retrace your steps to the outer arm, and the pathways _2-3_ times each. Do _1__ time per day.  Copyright  VHI. All rights reserved.  ARM: Dorsum of Hand to Shoulder - Sweep   Pump or stationary circles _5__ times on back of hand including knuckle spaces and individual fingers if needed working up towards the wrist, then retrace all your steps working back up the forearm, doing both sides; upper outer arm and back to your pathways _2-3_ times each. Then do 5 circles again at uninvolved armpit and involved groin where you started! Good job!! Do __1_ time per day.  Copyright  VHI. All rights reserved.      Copyright  VHI. All rights reserved.

## 2016-12-06 ENCOUNTER — Ambulatory Visit: Payer: Worker's Compensation | Attending: Specialist | Admitting: Physical Therapy

## 2016-12-06 DIAGNOSIS — R2232 Localized swelling, mass and lump, left upper limb: Secondary | ICD-10-CM | POA: Diagnosis not present

## 2016-12-06 NOTE — Therapy (Signed)
[place], [place], [address] Phone: [phone]   Fax:  [phone]  Physical Therapy Treatment  Patient Details  Name: Charles Mccoy MRN: [id number] Date of Birth: Feb 07, 1951 Referring Provider: Lacie Draft   Encounter Date: 12/06/2016      PT End of Session - 12/06/16 1228    Visit Number 3   Number of Visits 13   Date for PT Re-Evaluation 12/31/16   PT Start Time 0850   PT Stop Time 0938   PT Time Calculation (min) 48 min   Behavior During Therapy [facility] for tasks assessed/performed      Past Medical History:  Diagnosis Date  . ALLERGIC RHINITIS 06/19/2007  . ASTHMA 08/17/2008  . Back pain 05/2016  . COPD (chronic obstructive pulmonary disease) ([place])   . GERD (gastroesophageal reflux disease)   . HYPERTENSION 06/19/2007  . Hypertension    meds controlling well  . OSTEOARTHRITIS 10/09/2009   wrists, knee.  . Pneumonia    hx of   . SINUSITIS 09/15/2007   recent issuses 2 weeks ago-clearing.    Past Surgical History:  Procedure Laterality Date  . cyst removed from neck      age 5   . KNEE ARTHROSCOPY     2 on left knee , 1 on right  . TOTAL KNEE ARTHROPLASTY Left 07/21/2014   Procedure: LEFT TOTAL KNEE ARTHROPLASTY;  Surgeon: Johnn Hai, MD;  Location: [facility];  Service: Orthopedics;  Laterality: Left;  . TOTAL KNEE ARTHROPLASTY Right 05/30/2016   Procedure: RIGHT TOTAL KNEE ARTHROPLASTY REPLACEMENT;  Surgeon: Susa Day, MD;  Location[facility];  Service: Orthopedics;  Laterality: Right;    There were no vitals filed for this visit.      Subjective Assessment - 12/06/16 0852    Subjective "It was a little more comfortable this time than last."   Currently in Pain? No/denies                         [facility] Adult PT Treatment/Exercise - 12/06/16 0001      Manual Therapy   Manual Lymphatic Drainage (MLD) in supine with left upper arm supported on pillow and  hand on his abdomen:  diaphragmatic breathing, short neck, right axilla and anterior interaxillary anastomosis, left groin and axillo-inguinal anastomosis, and left UE from dorsal hand to shoulder; in right sidelying, reinforced outer upper arm pathway and worked posterior interaxillary anastomosis from left to right.  Reviewed principles and techniques verbally with patient's wife, who was present   Compression Bandaging Lotion applied to left UE, then bandaging with thick stockinette, Elastomull to fingers 1-4, 1/4 inch gray foam at medical and anterior elbow, Artiflex x 2, and 1-8, 1-10, and 2-12 cm. short stretch bandages from hand to axilla.  Tape on first three digits to keep Elastomull from catching on Velcro of sling.  Verbal reminders to patient's wife about bandaging technique.                PT Education - 12/06/16 1228    Education provided Yes   Education Details manual lymph drainage and bandaging   Person(s) Educated Patient;Spouse   Methods Explanation;Verbal cues   Comprehension Need further instruction;Verbalized understanding                Long Term Clinic Goals - 12/03/16 1759      CC Long Term Goal  #1   Title Pt and family will  be independent in manual lymph drainage and compression bandaging to control swelling of left arm.   Time 4   Period Weeks   Status New     CC Long Term Goal  #2   Title Pt will have a decreased in circumferential measurement of left arm at 10 cm proximal to ulnar styloid to 23 cm    Baseline 25.5 on 12/04/2016   Time 4   Period Weeks   Status New     CC Long Term Goal  #3   Title Pt will have decrease in circumference around the olecranon process of left arm to 30 cm    Baseline 33 cm on 2//11/14/2016            Plan - 12/06/16 1228    Clinical Impression Statement Both patient and his wife remarked at how much his arm has reduced.  He is doing better tolerating the bandages, but is concerned about lasting through  the weekend, and was told he would probably need to take them off Sunday.  He may be able to discharge soon if his wife is able to continue bandaging at home.   Rehab Potential Good   Clinical Impairments Affecting Rehab Potential healing humeral fracture  NO ROM of SHOULDER yet   PT Frequency 3x / week   PT Duration 4 weeks   PT Treatment/Interventions ADLs/Self Care Home Management;Patient/family education;Taping;DME Instruction;Manual techniques;Therapeutic activities;Manual lymph drainage;Compression bandaging;Therapeutic exercise   PT Next Visit Plan continue complete decongestive therapy; consider having pt.'s wife actually do the bandaging with supervision   Consulted and Agree with Plan of Care Patient      Patient will benefit from skilled therapeutic intervention in order to improve the following deficits and impairments:  Decreased range of motion, Pain, Impaired UE functional use, Decreased knowledge of use of DME, Decreased knowledge of precautions, Increased edema  Visit Diagnosis: Localized swelling, mass and lump, left upper limb     Problem List Patient Active Problem List   Diagnosis Date Noted  . Primary osteoarthritis of right knee 05/30/2016  . Right knee DJD 05/30/2016  . GERD (gastroesophageal reflux disease) 07/28/2014  . Left knee DJD 07/21/2014  . Osteoarthritis 10/09/2009  . Asthma 08/17/2008  . SINUSITIS 09/15/2007  . Essential hypertension 06/19/2007  . Allergic rhinitis 06/19/2007    Charles Mccoy 12/06/2016, 12:31 PM  [place], [place], [address] Phone: [phone]   Fax:  [phone]  Name: Charles Mccoy MRN: [id number] Date of Birth: 1951-02-04  Charles Mccoy, PT 12/06/16 12:31 PM

## 2016-12-09 ENCOUNTER — Ambulatory Visit: Payer: Worker's Compensation | Attending: Specialist | Admitting: Physical Therapy

## 2016-12-09 ENCOUNTER — Encounter: Payer: Self-pay | Admitting: Physical Therapy

## 2016-12-09 DIAGNOSIS — R2232 Localized swelling, mass and lump, left upper limb: Secondary | ICD-10-CM | POA: Diagnosis present

## 2016-12-09 DIAGNOSIS — M25512 Pain in left shoulder: Secondary | ICD-10-CM | POA: Diagnosis present

## 2016-12-09 DIAGNOSIS — M25622 Stiffness of left elbow, not elsewhere classified: Secondary | ICD-10-CM | POA: Insufficient documentation

## 2016-12-09 DIAGNOSIS — M25612 Stiffness of left shoulder, not elsewhere classified: Secondary | ICD-10-CM | POA: Diagnosis present

## 2016-12-09 DIAGNOSIS — M6281 Muscle weakness (generalized): Secondary | ICD-10-CM | POA: Insufficient documentation

## 2016-12-09 NOTE — Therapy (Signed)
[place], [place], [address] Phone: [phone]   Fax:  [phone]  Physical Therapy Treatment  Patient Details  Name: Charles Mccoy MRN: [id number] Date of Birth: [birth year] Referring Provider: Lacie Draft   Encounter Date: 12/09/2016      PT End of Session - 12/09/16 1710    Visit Number 4   Number of Visits 13   Date for PT Re-Evaluation 12/31/16   PT Start Time [id number]   PT Stop Time 1705   PT Time Calculation (min) 60 min   Activity Tolerance Patient tolerated treatment well   Behavior During Therapy [facility] for tasks assessed/performed      Past Medical History:  Diagnosis Date  . ALLERGIC RHINITIS 06/19/2007  . ASTHMA 08/17/2008  . Back pain 05/2016  . COPD (chronic obstructive pulmonary disease) ([place])   . GERD (gastroesophageal reflux disease)   . HYPERTENSION 06/19/2007  . Hypertension    meds controlling well  . OSTEOARTHRITIS 10/09/2009   wrists, knee.  . Pneumonia    hx of   . SINUSITIS 09/15/2007   recent issuses 2 weeks ago-clearing.    Past Surgical History:  Procedure Laterality Date  . cyst removed from neck      age 33   . KNEE ARTHROSCOPY     2 on left knee , 1 on right  . TOTAL KNEE ARTHROPLASTY Left 07/21/2014   Procedure: LEFT TOTAL KNEE ARTHROPLASTY;  Surgeon: Johnn Hai, MD;  Location: [facility];  Service: Orthopedics;  Laterality: Left;  . TOTAL KNEE ARTHROPLASTY Right 05/30/2016   Procedure: RIGHT TOTAL KNEE ARTHROPLASTY REPLACEMENT;  Surgeon: Susa Day, MD;  Location: [facility];  Service: Orthopedics;  Laterality: Right;    There were no vitals filed for this visit.      Subjective Assessment - 12/09/16 1608    Subjective I am going to receive occupation therapy tomorrow for the shoulder. That appointment is at [facility] neurology. I have left my bandages on all weekend.    Pertinent History 66  man fell off a ladder at work and sustained fracture of left humerus  on Feb. 9.  He has been treated with immobilization in a sling and had pitting edema in left forearm and around elbow . Pt states he had some problems getting rid of the swelling in his right leg after a knee replacement.    Patient Stated Goals to get rid of the swelling   Currently in Pain? No/denies               LYMPHEDEMA/ONCOLOGY QUESTIONNAIRE - 12/09/16 1617      Left Upper Extremity Lymphedema   15 cm Proximal to Olecranon Process 30 cm   10 cm Proximal to Olecranon Process 28 cm   Olecranon Process 28.1 cm   15 cm Proximal to Ulnar Styloid Process 27.5 cm   10 cm Proximal to Ulnar Styloid Process 24 cm   Just Proximal to Ulnar Styloid Process 17.6 cm   Across Hand at [employer] 21.8 cm   At [place] of 2nd Digit 7.4 cm                  [facility] Adult PT Treatment/Exercise - 12/09/16 0001      Manual Therapy   Manual Lymphatic Drainage (MLD) in long sitting for pt comfort with left upper arm supported on pillow and hand on his abdomen:  diaphragmatic breathing, short neck, right axilla and anterior interaxillary anastomosis, left  groin and axillo-inguinal anastomosis, and left UE from dorsal hand to shoulder- briefly performed today to focus on instructing pt's wife in compression bandaging   Compression Bandaging Lotion applied to left UE, then bandaging with thick stockinette, Elastomull to fingers 2-4, 1/4 inch gray foam at medical and anterior elbow, Artiflex x 2, and 1-8, 1-10, and 2-12 cm. short stretch bandages from hand to axilla. Instructed pt's wife on compression bandaging technique and had her demonstrate technique following verbal and tactile cueing. Issued pt's and wife a handout with pictures of compression bandaging                PT Education - 12/09/16 1709    Education provided Yes   Education Details UE compression bandaging   Person(s) Educated Patient;Spouse   Methods Explanation;Demonstration;Verbal cues;Tactile cues;Handout    Comprehension Verbalized understanding;Returned demonstration;Verbal cues required;Tactile cues required                Long Term Clinic Goals - 12/03/16 1759      CC Long Term Goal  #1   Title Pt and family will be independent in manual lymph drainage and compression bandaging to control swelling of left arm.   Time 4   Period Weeks   Status New     CC Long Term Goal  #2   Title Pt will have a decreased in circumferential measurement of left arm at 10 cm proximal to ulnar styloid to 23 cm    Baseline 25.5 on 12/04/2016   Time 4   Period Weeks   Status New     CC Long Term Goal  #3   Title Pt will have decrease in circumference around the olecranon process of left arm to 30 cm    Baseline 33 cm on 2//11/14/2016            Plan - 12/09/16 1710    Clinical Impression Statement Patient left bandages intact since last session. He demonstrates signficant reduction of LUE edema especially at upper arm today. Pt still has swelling present in forearm though it is reduced this visit. Performed manual lymphatic drainage sequence briefly today and focused on instructing pt's wife in compression bandaging technique. Had pt's wife return demonstrate correct technique for bandaging LUE and L fingers. Issued pt and wife a handout with pictures on how to perform compression bandaging. He has an occupational therapy appointment tomorrow morning and may need to remove his compression bandages. His wife will attempt to re don bandages after appointment if they need to be removed.    Rehab Potential Good   Clinical Impairments Affecting Rehab Potential healing humeral fracture  NO ROM of SHOULDER yet   PT Frequency 3x / week   PT Duration 4 weeks   PT Treatment/Interventions ADLs/Self Care Home Management;Patient/family education;Taping;DME Instruction;Manual techniques;Therapeutic activities;Manual lymph drainage;Compression bandaging;Therapeutic exercise   PT Next Visit Plan assess pt's wife  for independence with compression bandaging, continue complete decongestive therapy   Consulted and Agree with Plan of Care Patient   Family Member Consulted wife       Patient will benefit from skilled therapeutic intervention in order to improve the following deficits and impairments:  Decreased range of motion, Pain, Impaired UE functional use, Decreased knowledge of use of DME, Decreased knowledge of precautions, Increased edema  Visit Diagnosis: Localized swelling, mass and lump, left upper limb     Problem List Patient Active Problem List   Diagnosis Date Noted  . Primary osteoarthritis of right knee 05/30/2016  .  Right knee DJD 05/30/2016  . GERD (gastroesophageal reflux disease) 07/28/2014  . Left knee DJD 07/21/2014  . Osteoarthritis 10/09/2009  . Asthma 08/17/2008  . SINUSITIS 09/15/2007  . Essential hypertension 06/19/2007  . Allergic rhinitis 06/19/2007    Allyson Sabal [facility] 12/09/2016, 5:14 PM  [place], [place], [address] Phone: [phone]   Fax:  [phone]  Name: Charles Mccoy MRN: [id number] Date of Birth: 05-08-1951  Manus Gunning, PT 12/09/16 5:14 PM

## 2016-12-10 ENCOUNTER — Ambulatory Visit: Payer: Worker's Compensation | Attending: Specialist | Admitting: Occupational Therapy

## 2016-12-10 DIAGNOSIS — M25512 Pain in left shoulder: Secondary | ICD-10-CM | POA: Insufficient documentation

## 2016-12-10 DIAGNOSIS — R2232 Localized swelling, mass and lump, left upper limb: Secondary | ICD-10-CM | POA: Insufficient documentation

## 2016-12-10 DIAGNOSIS — M25612 Stiffness of left shoulder, not elsewhere classified: Secondary | ICD-10-CM | POA: Insufficient documentation

## 2016-12-10 DIAGNOSIS — M6281 Muscle weakness (generalized): Secondary | ICD-10-CM | POA: Insufficient documentation

## 2016-12-10 DIAGNOSIS — M25622 Stiffness of left elbow, not elsewhere classified: Secondary | ICD-10-CM

## 2016-12-10 NOTE — Patient Instructions (Addendum)
Do not move shoulder while exercising  AROM: Wrist Extension   .  With _left___ palm down, bend wrist up. Repeat __15__ times per set.  Do __4-6__ sessions per day.      AROM: Forearm Pronation / Supination   With _left___ arm in handshake position, slowly rotate palm down until stretch is felt. Relax. Then rotate palm up until stretch is felt. Support with right hand Repeat _15___ times per set. Do _4-6___ sessions per day.  Copyright  VHI. All rights reserved.     AROM: Elbow Flexion / Extension    Gently bend elbow support using 2 fingers from right hand. Then straighten arm as far as possible. Repeat _10-15___ times per set. Do _4-6___ sessions per day.     Practice opening and closing hand, 15 reps 4x day

## 2016-12-10 NOTE — Therapy (Signed)
[facility] [address] [place], [place], [address] Phone: [phone]   Fax:  [phone]  Occupational Therapy Evaluation  Patient Details  Name: Charles Mccoy MRN: [id number] Date of Birth: Dec 04, 1950 Referring Provider: Dr. Tonita Cong Pearline Cables. Phoebe Sharps)  Encounter Date: 12/10/2016      OT End of Session - 12/10/16 1630    Visit Number 1   Number of Visits 17   Date for OT Re-Evaluation 02/07/17   Authorization Type worker's comp   Authorization Time Period 12 vistis auth   Authorization - Visit Number 1   Authorization - Number of Visits 12   OT Start Time 0805   OT Stop Time 0850   OT Time Calculation (min) 45 min   Activity Tolerance Patient tolerated treatment well   Behavior During Therapy WFL for tasks assessed/performed      Past Medical History:  Diagnosis Date  . ALLERGIC RHINITIS 06/19/2007  . ASTHMA 08/17/2008  . Back pain 05/2016  . COPD (chronic obstructive pulmonary disease) ([place])   . GERD (gastroesophageal reflux disease)   . HYPERTENSION 06/19/2007  . Hypertension    meds controlling well  . OSTEOARTHRITIS 10/09/2009   wrists, knee.  . Pneumonia    hx of   . SINUSITIS 09/15/2007   recent issuses 2 weeks ago-clearing.    Past Surgical History:  Procedure Laterality Date  . cyst removed from neck      age 54   . KNEE ARTHROSCOPY     2 on left knee , 1 on right  . TOTAL KNEE ARTHROPLASTY Left 07/21/2014   Procedure: LEFT TOTAL KNEE ARTHROPLASTY;  Surgeon: Johnn Hai, MD;  Location: [facility];  Service: Orthopedics;  Laterality: Left;  . TOTAL KNEE ARTHROPLASTY Right 05/30/2016   Procedure: RIGHT TOTAL KNEE ARTHROPLASTY REPLACEMENT;  Surgeon: Susa Day, MD;  Location: [facility];  Service: Orthopedics;  Laterality: Right;    There were no vitals filed for this visit.      Subjective Assessment - 12/10/16 1659    Subjective  Pt fell off ladder at work and sustained left humeral fx   Pertinent History see epic, no shoulder movement, cleared for gentle ROM to elbow, forearm and wrist   Patient Stated Goals regain use of LUE   Currently in Pain? Yes   Pain Score 6    Pain Location Shoulder   Pain Orientation Left   Pain Descriptors / Indicators Aching   Pain Type Acute pain   Pain Onset 1 to 4 weeks ago   Pain Frequency Intermittent   Aggravating Factors  moving wrong   Pain Relieving Factors pain meds   Effect of Pain on Daily Activities limits daily activities           [facility] OT Assessment - 12/10/16 0811      Assessment   Diagnosis closed fx of left proximal humerus   Referring Provider Dr. Rolanda Jay M. Bissell   Onset Date 11/15/16   Prior Therapy PT     Precautions   Precautions Other (comment)   Precaution Comments no shoulder movement, GROM at elbow and wrist     Restrictions   Weight Bearing Restrictions Yes     Balance Screen   Has the patient fallen in the past 6 months Yes   How many times? 1   Has the patient had a decrease in activity level because of a fear of falling?  No   Is the patient reluctant to  leave their home because of a fear of falling?  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   sports television   Leisure had been regular walking      ADL   Eating/Feeding Needs assist with cutting food  eating with RUE   Grooming Modified independent   Upper Body Bathing Minimal assistance   Lower Body Bathing Modified independent   Upper Body Dressing Minimal assistance  donnning shirt   Lower Body Dressing Minimal assistance  needs assist tying shoes   Toilet  Tranfer Modified independent   Tub/Shower Transfer Modified independent     IADL   Shopping Needs to be accompanied on any shopping trip   [place] --  has not attempted   Meal Prep Able to complete simple cold meal and snack prep   Medication Management Is responsible for taking medication in correct dosages at correct time   Financial Management Requires assistance     Mobility   Mobility Status Independent     Written Expression   Dominant Hand Left     Cognition   Overall Cognitive Status Within Functional Limits for tasks assessed     Sensation   Light Touch Appears Intact     Coordination   Gross Motor Movements are Fluid and Coordinated No     ROM / Strength   AROM / PROM / Strength AROM     AROM   Overall AROM Comments did not test left shoulder due to restrictions. Therapsit measured pt ROM with lyphadema wrappings in place. Therefore measurements may be reduced.    AROM Assessment Site Elbow;Forearm;Wrist;Finger   Right/Left Elbow Left   Left Elbow Flexion 100   Left Elbow Extension -30   Right/Left Forearm Left   Left Forearm Pronation 90 Degrees   Left Forearm Supination 50 Degrees   Right/Left Wrist Left   Left Wrist Extension 35 Degrees   Left Wrist Flexion 45 Degrees   Left Wrist Radial Deviation 15 Degrees   Left Wrist Ulnar Deviation 35 Degrees   Right/Left Finger Left   Left Composite Finger Extension --  grossly 90%          Treatment. hotpack applied to neck x 8 mins, no adverse reactions while pt was instructed in AA/ROM for elbow, forearm and A/ROM wrist/ hand. Pt was instructed to discuss use of heat on neck with lymphdema PT before using at home. Pt sees PT for lymphdema on Wed, Friday               OT Education - 12/10/16 1655    Education provided Yes   Education Details Pt was educated in elbow, forearm and wrist AA/ROM, pt was instructed to avoid shoulder ROM due to prec.   Person(s) Educated Patient;Spouse    Methods Explanation;Demonstration;Handout   Comprehension Verbalized understanding;Returned demonstration;Verbal cues required          OT Short Term Goals - 12/10/16 1619      OT SHORT TERM GOAL #1   Title I with inital HEP. due 01/09/17   Time 4   Period Weeks   Status  New     OT SHORT TERM GOAL #2   Title Pt will increase left elbow A/ROM flexion to 120* and extension to -15 in prep for functional use.   Baseline flexion/ extension 100/ -30   Time 4   Period Weeks   Status New     OT SHORT TERM GOAL #3   Title Pt will increase LUE supination to 75* in prep for functional use during ADLS.   Baseline supination/ pronation: 50/90   Time 4   Period Weeks   Status New     OT SHORT TERM GOAL #4   Title Pt will verbalize understanding of LUE positioning to minimize edema, pain and risk for further injury.   Time 4   Period Weeks   Status New     OT SHORT TERM GOAL #5   Title Pt will perform all basic ADLS modified independently.   Time 4   Period Weeks   Status New           OT Long Term Goals - 12/10/16 1625      OT LONG TERM GOAL #1   Title I with updated HEP (including shoulder ROM once cleared by MD.)   Time 8   Period Weeks   Status New     OT LONG TERM GOAL #2   Title Pt will demonstrate ability to retrieve a lightweight object at 120 shoulder flexion with pain less than or equal to 3/10.   Time 8   Period Weeks   Status New     OT LONG TERM GOAL #3   Title Pt will demonstrate ability to perform simulated work activities.   Time 8   Period Weeks   Status New     OT LONG TERM GOAL #4   Title Pt will resume use of LUE as his domiant hand 90% of the time for ADLS/IADLS, and work activities.   Time 8   Period Weeks   Status New     OT LONG TERM GOAL #5   Title Pt will demonstrate elbow and forearm AROM WFLS for the performance of work activities.   Time 8   Period Weeks   Status New     Long Term Additional Goals   Additional Long Term Goals  Yes               Plan - 12/10/16 1614    Clinical Impression Statement Pt is a 66 y.o male who fell off a ladder at work and sustained a left proximal humeral fx. Pt presents with LUE edema, decreased A/ROM, decreased strength, pain and decreased functional use which impedes performance of pt's daily activities. Pt can benefit from skilled occupational therapy to maximize safety and independence with ADLS, IADLs and work activities.   Rehab Potential Good   OT Frequency 3x / week  plus eval   OT Duration 8 weeks   OT Treatment/Interventions Self-care/ADL training;Moist Heat;Fluidtherapy;DME and/or AE instruction;Splinting;Patient/family education;Therapeutic exercises;Contrast Bath;Therapeutic exercise;Ultrasound;Therapeutic activities;Passive range of motion;Neuromuscular education;Cryotherapy;Electrical Stimulation;Energy conservation;Parrafin;Manual Therapy   Plan gentle ROM to left hand, forearm and elbow   Consulted and Agree with Plan of Care Patient;Family member/caregiver   Family Member Consulted wife      Patient will benefit from skilled therapeutic intervention in order to improve the following deficits and impairments:  Decreased coordination, Decreased range of motion, Increased edema, Impaired UE functional use, Pain, Decreased strength  Visit Diagnosis: Stiffness of left elbow, not elsewhere classified - Plan: Ot plan of  care cert/re-cert  Acute pain of left shoulder - Plan: Ot plan of care cert/re-cert  Stiffness of left shoulder, not elsewhere classified - Plan: Ot plan of care cert/re-cert  Muscle weakness (generalized) - Plan: Ot plan of care cert/re-cert  Localized swelling, mass and lump, left upper limb - Plan: Ot plan of care cert/re-cert    Problem List Patient Active Problem List   Diagnosis Date Noted  . Primary osteoarthritis of right knee 05/30/2016  . Right knee DJD 05/30/2016  . GERD (gastroesophageal reflux disease) 07/28/2014  . Left knee  DJD 07/21/2014  . Osteoarthritis 10/09/2009  . Asthma 08/17/2008  . SINUSITIS 09/15/2007  . Essential hypertension 06/19/2007  . Allergic rhinitis 06/19/2007    RINE,KATHRYN 12/10/2016, 5:03 PM  [facility] [address] [place], [place], [address] Phone: [phone]   Fax:  [phone]  Name: Charles Mccoy MRN: [id number] Date of Birth: April 11, 1951

## 2016-12-11 ENCOUNTER — Ambulatory Visit: Payer: Worker's Compensation | Admitting: Physical Therapy

## 2016-12-11 ENCOUNTER — Encounter: Payer: Self-pay | Admitting: Physical Therapy

## 2016-12-11 DIAGNOSIS — M25512 Pain in left shoulder: Secondary | ICD-10-CM

## 2016-12-11 DIAGNOSIS — R2232 Localized swelling, mass and lump, left upper limb: Secondary | ICD-10-CM | POA: Diagnosis not present

## 2016-12-11 DIAGNOSIS — M25622 Stiffness of left elbow, not elsewhere classified: Secondary | ICD-10-CM

## 2016-12-11 NOTE — Therapy (Signed)
[place], [place], [address] Phone: [phone]   Fax:  [phone]  Physical Therapy Treatment  Patient Details  Name: Charles Mccoy MRN: [id number] Date of Birth: 04/19/1951 Referring Provider: Lacie Draft   Encounter Date: 12/11/2016      PT End of Session - 12/11/16 1127    Visit Number 5   Number of Visits 13   Date for PT Re-Evaluation 12/31/16   PT Start Time 1025   PT Stop Time 1115   PT Time Calculation (min) 50 min   Activity Tolerance Patient tolerated treatment well   Behavior During Therapy [facility] for tasks assessed/performed      Past Medical History:  Diagnosis Date  . ALLERGIC RHINITIS 06/19/2007  . ASTHMA 08/17/2008  . Back pain 05/2016  . COPD (chronic obstructive pulmonary disease) ([place])   . GERD (gastroesophageal reflux disease)   . HYPERTENSION 06/19/2007  . Hypertension    meds controlling well  . OSTEOARTHRITIS 10/09/2009   wrists, knee.  . Pneumonia    hx of   . SINUSITIS 09/15/2007   recent issuses 2 weeks ago-clearing.    Past Surgical History:  Procedure Laterality Date  . cyst removed from neck      age 65   . KNEE ARTHROSCOPY     2 on left knee , 1 on right  . TOTAL KNEE ARTHROPLASTY Left 07/21/2014   Procedure: LEFT TOTAL KNEE ARTHROPLASTY;  Surgeon: Johnn Hai, MD;  Location: [facility];  Service: Orthopedics;  Laterality: Left;  . TOTAL KNEE ARTHROPLASTY Right 05/30/2016   Procedure: RIGHT TOTAL KNEE ARTHROPLASTY REPLACEMENT;  Surgeon: Susa Day, MD;  [facility];  Service: Orthopedics;  Laterality: Right;    There were no vitals filed for this visit.      Subjective Assessment - 12/11/16 1024    Subjective I started occupational therapy. I kept my bandages on during the appointment. I have had the bandages on since Monday.    Patient is accompained by: Family member   Pertinent History 4  man fell off a ladder at work and sustained fracture  of left humerus on Feb. 9.  He has been treated with immobilization in a sling and had pitting edema in left forearm and around elbow . Pt states he had some problems getting rid of the swelling in his right leg after a knee replacement.    Patient Stated Goals to get rid of the swelling   Currently in Pain? No/denies   Pain Score 0-No pain                         [facility] Adult PT Treatment/Exercise - 12/11/16 0001      Manual Therapy   Manual Lymphatic Drainage (MLD) in long sitting for pt comfort with left upper arm supported on pillow and hand on his abdomen:  diaphragmatic breathing, short neck, right axilla and anterior interaxillary anastomosis, left groin and axillo-inguinal anastomosis, and left UE from dorsal hand to shoulder   Compression Bandaging Lotion applied to left UE, then bandaging with thick stockinette, Elastomull to fingers 2-4, 1/4 inch gray foam at medical and anterior elbow, Artiflex x 2, and 1-8, 1-10, and 2-12 cm. short stretch bandages from hand to axilla.                         [facility] Goals - 12/03/16 1759  CC Long Term Goal  #1   Title Pt and family will be independent in manual lymph drainage and compression bandaging to control swelling of left arm.   Time 4   Period Weeks   Status New     CC Long Term Goal  #2   Title Pt will have a decreased in circumferential measurement of left arm at 10 cm proximal to ulnar styloid to 23 cm    Baseline 25.5 on 12/04/2016   Time 4   Period Weeks   Status New     CC Long Term Goal  #3   Title Pt will have decrease in circumference around the olecranon process of left arm to 30 cm    Baseline 33 cm on 2//11/14/2016            Plan - 12/11/16 1203    Clinical Impression Statement Pt demonstrates further reduction of LUE swelling per visual estimate today. He is anxious to be done with compression bandaging. He has begun occupational therapy for his left shoulder. Pt's  wife was instructed at last session in compression banaging technique and pt states his wife has been performing manual lymphatic drainage on him at home. Next session to assess pt's wife indepedence with compression bandaging.    Rehab Potential Good   Clinical Impairments Affecting Rehab Potential healing humeral fracture  NO ROM of SHOULDER yet   PT Frequency 3x / week   PT Duration 4 weeks   PT Treatment/Interventions ADLs/Self Care Home Management;Patient/family education;Taping;DME Instruction;Manual techniques;Therapeutic activities;Manual lymph drainage;Compression bandaging;Therapeutic exercise   PT Next Visit Plan assess pt's wife for independence with compression bandaging, continue complete decongestive therapy   Consulted and Agree with Plan of Care Patient   Family Member Consulted wife       Patient will benefit from skilled therapeutic intervention in order to improve the following deficits and impairments:  Decreased range of motion, Pain, Impaired UE functional use, Decreased knowledge of use of DME, Decreased knowledge of precautions, Increased edema  Visit Diagnosis: Stiffness of left elbow, not elsewhere classified  Acute pain of left shoulder     Problem List Patient Active Problem List   Diagnosis Date Noted  . Primary osteoarthritis of right knee 05/30/2016  . Right knee DJD 05/30/2016  . GERD (gastroesophageal reflux disease) 07/28/2014  . Left knee DJD 07/21/2014  . Osteoarthritis 10/09/2009  . Asthma 08/17/2008  . SINUSITIS 09/15/2007  . Essential hypertension 06/19/2007  . Allergic rhinitis 06/19/2007    Allyson Sabal [facility] 12/11/2016, 12:08 PM  [place], [place], [address] Phone: [phone]   Fax:  [phone]  Name: Charles Mccoy MRN: [id number] Date of Birth: 12-Nov-1950  Manus Gunning, PT 12/11/16 12:08 PM

## 2016-12-13 ENCOUNTER — Ambulatory Visit: Payer: Worker's Compensation | Admitting: Physical Therapy

## 2016-12-13 ENCOUNTER — Ambulatory Visit: Payer: Worker's Compensation | Admitting: Occupational Therapy

## 2016-12-13 DIAGNOSIS — R2232 Localized swelling, mass and lump, left upper limb: Secondary | ICD-10-CM | POA: Diagnosis not present

## 2016-12-13 DIAGNOSIS — M25612 Stiffness of left shoulder, not elsewhere classified: Secondary | ICD-10-CM

## 2016-12-13 DIAGNOSIS — M6281 Muscle weakness (generalized): Secondary | ICD-10-CM

## 2016-12-13 DIAGNOSIS — M25622 Stiffness of left elbow, not elsewhere classified: Secondary | ICD-10-CM

## 2016-12-13 DIAGNOSIS — M25512 Pain in left shoulder: Secondary | ICD-10-CM

## 2016-12-13 NOTE — Therapy (Signed)
[place], [place], [address] Phone: [phone]   Fax:  [phone]  Physical Therapy Treatment  Patient Details  Name: Charles Mccoy MRN: [id number] Date of Birth: 27-Feb-1951 Referring Provider: Lacie Draft   Encounter Date: 12/13/2016      PT End of Session - 12/13/16 1230    Visit Number 6   Number of Visits 13   Date for PT Re-Evaluation 12/31/16   PT Start Time 2924   PT Stop Time 1058   PT Time Calculation (min) 43 min   Activity Tolerance Patient tolerated treatment well   Behavior During Therapy [facility] for tasks assessed/performed      Past Medical History:  Diagnosis Date  . ALLERGIC RHINITIS 06/19/2007  . ASTHMA 08/17/2008  . Back pain 05/2016  . COPD (chronic obstructive pulmonary disease) ([place])   . GERD (gastroesophageal reflux disease)   . HYPERTENSION 06/19/2007  . Hypertension    meds controlling well  . OSTEOARTHRITIS 10/09/2009   wrists, knee.  . Pneumonia    hx of   . SINUSITIS 09/15/2007   recent issuses 2 weeks ago-clearing.    Past Surgical History:  Procedure Laterality Date  . cyst removed from neck      age 70   . KNEE ARTHROSCOPY     2 on left knee , 1 on right  . TOTAL KNEE ARTHROPLASTY Left 07/21/2014   Procedure: LEFT TOTAL KNEE ARTHROPLASTY;  Surgeon: Johnn Hai, MD;  Location: [facility];  Service: Orthopedics;  Laterality: Left;  . TOTAL KNEE ARTHROPLASTY Right 05/30/2016   Procedure: RIGHT TOTAL KNEE ARTHROPLASTY REPLACEMENT;  Surgeon: Susa Day, MD;  Location: [facility];  Service: Orthopedics;  Laterality: Right;    There were no vitals filed for this visit.      Subjective Assessment - 12/13/16 1226    Subjective "It feels so good to have the bandages off!"    Patient is accompained by: Family member  wife    Pertinent History 30  man fell off a ladder at work and sustained fracture of left humerus on Feb. 9.  He has been treated with  immobilization in a sling and had pitting edema in left forearm and around elbow . Pt states he had some problems getting rid of the swelling in his right leg after a knee replacement.    Patient Stated Goals to get rid of the swelling   Currently in Pain? Yes   Pain Score 3    Pain Location Shoulder   Pain Orientation Left               LYMPHEDEMA/ONCOLOGY QUESTIONNAIRE - 12/13/16 1027      Left Upper Extremity Lymphedema   10 cm Proximal to Olecranon Process 29 cm   Olecranon Process 28.5 cm   15 cm Proximal to Ulnar Styloid Process 27.2 cm   10 cm Proximal to Ulnar Styloid Process 23 cm   Just Proximal to Ulnar Styloid Process 18 cm   Across Hand at [employer] 22.3 cm   At [place] of 2nd Digit 7 cm                  [facility] Adult PT Treatment/Exercise - 12/13/16 0001      Self-Care   Self-Care Other Self-Care Comments   Other Self-Care Comments  reviewed when to do manual lymph draiange and use of compression with wife and patient      Manual Therapy  Manual Lymphatic Drainage (MLD) in supine with pillows for pt comfort with left upper arm supported on pillow and hand on his abdomen:  diaphragmatic breathing, short neck, right axilla and anterior interaxillary anastomosis, left groin and axillo-inguinal anastomosis, and left UE from dorsal hand to shoulder                        [facility] Goals - 12/13/16 1057      CC Long Term Goal  #1   Title Pt and family will be independent in manual lymph drainage and compression bandaging to control swelling of left arm.   Status Achieved     CC Long Term Goal  #2   Title Pt will have a decreased in circumferential measurement of left arm at 10 cm proximal to ulnar styloid to 23 cm    Baseline (P)  25.5 on 12/04/2016, 23 on 12/13/2016          Patient will benefit from skilled therapeutic intervention in order to improve the following deficits and impairments:     Visit Diagnosis: No  diagnosis found.     Problem List Patient Active Problem List   Diagnosis Date Noted  . Primary osteoarthritis of right knee 05/30/2016  . Right knee DJD 05/30/2016  . GERD (gastroesophageal reflux disease) 07/28/2014  . Left knee DJD 07/21/2014  . Osteoarthritis 10/09/2009  . Asthma 08/17/2008  . SINUSITIS 09/15/2007  . Essential hypertension 06/19/2007  . Allergic rhinitis 06/19/2007   PHYSICAL THERAPY DISCHARGE SUMMARY  Visits from Start of Care: 6  Current functional level related to goals / functional outcomes: Pt feels his goal for getting his swelling down has been met   Remaining deficits: Healing humeral fracture    Education / Equipment: Manual lymph drainage, compression bandaging. Plan: Patient agrees to discharge.  Patient goals were met. Patient is being discharged due to meeting the stated rehab goals.  ?????    Donato Heinz. Owens Shark PT  [facility] 12/13/2016, 12:34 PM  [place], [place], [address] Phone: [phone]   Fax:  [phone]  Name: Charles Mccoy MRN: [id number] Date of Birth: Feb 17, 1951

## 2016-12-13 NOTE — Therapy (Addendum)
[facility] [address] [place], [place], [address] Phone: [phone]   Fax:  [phone]  Occupational Therapy Treatment  Patient Details  Name: Charles Mccoy MRN: [id number] Date of Birth: 10-11-1950 Referring Provider: Dr. Tonita Cong Pearline Cables. Phoebe Sharps)  Encounter Date: 12/13/2016      OT End of Session - 12/13/16 1216    Visit Number 2   Number of Visits 17   Date for OT Re-Evaluation 02/07/17   Authorization Type worker's comp   Authorization Time Period 12 vistis auth   Authorization - Visit Number 2   Authorization - Number of Visits 12   OT Start Time [phone]   OT Stop Time 0930   OT Time Calculation (min) 38 min      Past Medical History:  Diagnosis Date  . ALLERGIC RHINITIS 06/19/2007  . ASTHMA 08/17/2008  . Back pain 05/2016  . COPD (chronic obstructive pulmonary disease) ([place])   . GERD (gastroesophageal reflux disease)   . HYPERTENSION 06/19/2007  . Hypertension    meds controlling well  . OSTEOARTHRITIS 10/09/2009   wrists, knee.  . Pneumonia    hx of   . SINUSITIS 09/15/2007   recent issuses 2 weeks ago-clearing.    Past Surgical History:  Procedure Laterality Date  . cyst removed from neck      age 70   . KNEE ARTHROSCOPY     2 on left knee , 1 on right  . TOTAL KNEE ARTHROPLASTY Left 07/21/2014   Procedure: LEFT TOTAL KNEE ARTHROPLASTY;  Surgeon: Johnn Hai, MD;  Location: [facility];  Service: Orthopedics;  Laterality: Left;  . TOTAL KNEE ARTHROPLASTY Right 05/30/2016   Procedure: RIGHT TOTAL KNEE ARTHROPLASTY REPLACEMENT;  Surgeon: Susa Day, MD;  Location: [facility];  Service: Orthopedics;  Laterality: Right;    There were no vitals filed for this visit.      Subjective Assessment - 12/13/16 1235    Subjective  Lymphadema therapist said it would be ok to remove lymphadema wraps. She will make sure pt's wife is educated in the wrapping.    Pertinent History see epic, no shoulder  movement, cleared for gentle ROM to elbow, forearm and wrist (Evaluating therapist recommends gentle AAROM at elbow and forearm to minimize risk of pt moving left shoulder,, A/ROM wrist and hand   Patient Stated Goals regain use of LUE   Currently in Pain? Yes   Pain Score 3    Pain Location Shoulder   Pain Orientation Left   Pain Descriptors / Indicators Aching   Pain Type Acute pain   Pain Onset 1 to 4 weeks ago   Pain Frequency Intermittent   Aggravating Factors  movments, malpositioning   Pain Relieving Factors properpositioning   Effect of Pain on Daily Activities limits daily activities            [facility] OT Assessment - 12/13/16 0001      Precautions   Precaution Comments no shoulder movement permitted at this time, GROM at elbow and wrist  gentle ROM  from elbow- hand, sling when not exercising   Required Braces or Orthoses Other Brace/Splint  sling                Reviewed and updated HEP for elbow-hand, no shoulder movement allowed at this time. Pt sees MD next Thursday. Lymphadema wraps were removed prior to exercises at pt sees PT after OT today.          OT Education -  12/13/16 1212    Education provided Yes   Education Details hand exercises for tendon gliding, and thumb A/ROM, reinforced importance of not moving shoulder while exercising from elbow down, reviewed previously issued elbow, forearm and wrist exercises.   Person(s) Educated Patient   Methods Explanation;Demonstration;Verbal cues;Handout   Comprehension Verbalized understanding;Returned demonstration;Verbal cues required          OT Short Term Goals - 12/10/16 1619      OT SHORT TERM GOAL #1   Title I with inital HEP. due 01/09/17   Time 4   Period Weeks   Status New     OT SHORT TERM GOAL #2   Title Pt will increase left elbow A/ROM flexion to 120* and extension to -15 in prep for functional use.   Baseline flexion/ extension 100/ -30   Time 4   Period Weeks   Status New      OT SHORT TERM GOAL #3   Title Pt will increase LUE supination to 75* in prep for functional use during ADLS.   Baseline supination/ pronation: 50/90   Time 4   Period Weeks   Status New     OT SHORT TERM GOAL #4   Title Pt will verbalize understanding of LUE positioning to minimize edema, pain and risk for further injury.   Time 4   Period Weeks   Status New     OT SHORT TERM GOAL #5   Title Pt will perform all basic ADLS modified independently.   Time 4   Period Weeks   Status New           OT Long Term Goals - 12/10/16 1625      OT LONG TERM GOAL #1   Title I with updated HEP (including shoulder ROM once cleared by MD.)   Time 8   Period Weeks   Status New     OT LONG TERM GOAL #2   Title Pt will demonstrate ability to retrieve a lightweight object at 120 shoulder flexion with pain less than or equal to 3/10.   Time 8   Period Weeks   Status New     OT LONG TERM GOAL #3   Title Pt will demonstrate ability to perform simulated work activities.   Time 8   Period Weeks   Status New     OT LONG TERM GOAL #4   Title Pt will resume use of LUE as his domiant hand 90% of the time for ADLS/IADLS, and work activities.   Time 8   Period Weeks   Status New     OT LONG TERM GOAL #5   Title Pt will demonstrate elbow and forearm AROM WFLS for the performance of work activities.   Time 8   Period Weeks   Status New     Long Term Additional Goals   Additional Long Term Goals Yes               Plan - 12/13/16 1214    Clinical Impression Statement Pt is progressing towards goals for ROM elbow, forearm, wrist and hand. Pt demonstates overall decreased edema today. Pt's lymphadema wraps were removed prior to exercise as pt sees PT after OT today.   Rehab Potential Good   OT Frequency 3x / week   OT Duration 8 weeks   OT Treatment/Interventions Self-care/ADL training;Moist Heat;Fluidtherapy;DME and/or AE instruction;Splinting;Patient/family  education;Therapeutic exercises;Contrast Bath;Therapeutic exercise;Ultrasound;Therapeutic activities;Passive range of motion;Neuromuscular education;Cryotherapy;Electrical Stimulation;Energy conservation;Parrafin;Manual Therapy   Plan gentle AA/ROM  to elbow, forearm and  A/ROM wrist and hand   Consulted and Agree with Plan of Care Patient      Patient will benefit from skilled therapeutic intervention in order to improve the following deficits and impairments:  Decreased coordination, Decreased range of motion, Increased edema, Impaired UE functional use, Pain, Decreased strength  Visit Diagnosis: Stiffness of left elbow, not elsewhere classified  Acute pain of left shoulder  Stiffness of left shoulder, not elsewhere classified  Muscle weakness (generalized)    Problem List Patient Active Problem List   Diagnosis Date Noted  . Primary osteoarthritis of right knee 05/30/2016  . Right knee DJD 05/30/2016  . GERD (gastroesophageal reflux disease) 07/28/2014  . Left knee DJD 07/21/2014  . Osteoarthritis 10/09/2009  . Asthma 08/17/2008  . SINUSITIS 09/15/2007  . Essential hypertension 06/19/2007  . Allergic rhinitis 06/19/2007   Theone Murdoch, OTR/L [phone] Phone: [phone] 12:43 PM 12/13/16 Makynlie Rossini 12/13/2016, 12:43 PM Theone Murdoch, OTR/L [phone] Phone: [phone] 12:43 PM 12/13/16 [facility] [address] [place], [place], [address] Phone: [phone]   Fax:  [phone]  Name: Charles Mccoy MRN: [id number] Date of Birth: [birth year]

## 2016-12-13 NOTE — Patient Instructions (Signed)
      Flexor Tendon Gliding (Active Hook Fist)   With fingers and knuckles straight, bend middle and tip joints. Do not bend large knuckles. Repeat _10-15___ times. Do _4-6___ sessions per day.  MP Flexion (Active)   With back of hand on table, bend large knuckles as far as they will go, keeping small joints straight. Repeat _10-15___ times. Do __4-6__ sessions per day. Activity: Reach into a narrow container.*      Finger Flexion / Extension   With palm up, bend fingers of left hand toward palm, making a  fist. Straighten fingers, opening fist. Repeat sequence _10-15___ times per session. Do _4-6__ sessions per day. Hand Variation: Palm down   Copyright  VHI. All rights reserved.     AROM: Finger Flexion / Extension   Actively bend fingers of  hand. Start with knuckles furthest from palm, and slowly make a fist. Hold __5__ seconds. Relax. Then straighten fingers as far as possible. Repeat _10-15___ times per set.  Do _4-6___ sessions per day.  Copyright  VHI. All rights reserved.      Opposition (Active)   Touch tip of thumb to nail tip of each finger in turn, making an "O" shape. Repeat __10__ times. Do _4-6___ sessions per day.   MP Flexion (Active)   Bend thumb to touch base of little finger, keeping tip joint straight. Repeat __10-15__ times. Do _4-6___ sessions per day.       IP Flexion (Active Blocked)   Brace thumb below tip joint. Bend joint as far as possible. Repeat __10__ times. Do _4-6___ sessions per day.   Composite Extension (Active)   Bring thumb up and out in hitchhiker position.  Repeat __10-15__ times. Do _4-6___ sessions per day.

## 2016-12-16 ENCOUNTER — Encounter: Payer: Worker's Compensation | Admitting: Occupational Therapy

## 2016-12-16 ENCOUNTER — Ambulatory Visit: Payer: Worker's Compensation | Admitting: *Deleted

## 2016-12-18 ENCOUNTER — Ambulatory Visit: Payer: Worker's Compensation | Attending: Specialist | Admitting: *Deleted

## 2016-12-18 ENCOUNTER — Encounter: Payer: Self-pay | Admitting: *Deleted

## 2016-12-18 DIAGNOSIS — M25612 Stiffness of left shoulder, not elsewhere classified: Secondary | ICD-10-CM | POA: Diagnosis present

## 2016-12-18 DIAGNOSIS — M25622 Stiffness of left elbow, not elsewhere classified: Secondary | ICD-10-CM | POA: Insufficient documentation

## 2016-12-18 DIAGNOSIS — M25512 Pain in left shoulder: Secondary | ICD-10-CM

## 2016-12-18 DIAGNOSIS — M6281 Muscle weakness (generalized): Secondary | ICD-10-CM | POA: Insufficient documentation

## 2016-12-18 DIAGNOSIS — R2232 Localized swelling, mass and lump, left upper limb: Secondary | ICD-10-CM | POA: Insufficient documentation

## 2016-12-18 NOTE — Therapy (Signed)
[facility] [address] [place], [place], [address] Phone: [phone]   Fax:  [phone]  Occupational Therapy Treatment  Patient Details  Name: Charles Mccoy MRN: [id number] Date of Birth: 10/14/1950 Referring Provider: Dr. Tonita Cong Pearline Cables. Phoebe Sharps)  Encounter Date: 12/18/2016      OT End of Session - 12/18/16 1143    Visit Number 3   Number of Visits 17   Date for OT Re-Evaluation 02/07/17   Authorization Type worker's comp   Authorization - Visit Number 3   Authorization - Number of Visits 12   OT Start Time 4010   OT Stop Time 1137   OT Time Calculation (min) 39 min   Activity Tolerance Patient tolerated treatment well   Behavior During Therapy WFL for tasks assessed/performed      Past Medical History:  Diagnosis Date  . ALLERGIC RHINITIS 06/19/2007  . ASTHMA 08/17/2008  . Back pain 05/2016  . COPD (chronic obstructive pulmonary disease) ([place])   . GERD (gastroesophageal reflux disease)   . HYPERTENSION 06/19/2007  . Hypertension    meds controlling well  . OSTEOARTHRITIS 10/09/2009   wrists, knee.  . Pneumonia    hx of   . SINUSITIS 09/15/2007   recent issuses 2 weeks ago-clearing.    Past Surgical History:  Procedure Laterality Date  . cyst removed from neck      age 30   . KNEE ARTHROSCOPY     2 on left knee , 1 on right  . TOTAL KNEE ARTHROPLASTY Left 07/21/2014   Procedure: LEFT TOTAL KNEE ARTHROPLASTY;  Surgeon: Johnn Hai, MD;  Location: [facility];  Service: Orthopedics;  Laterality: Left;  . TOTAL KNEE ARTHROPLASTY Right 05/30/2016   Procedure: RIGHT TOTAL KNEE ARTHROPLASTY REPLACEMENT;  Surgeon: Susa Day, MD;  Location: [facility];  Service: Orthopedics;  Laterality: Right;    There were no vitals filed for this visit.      Subjective Assessment - 12/18/16 1103    Subjective  Pt reports that he has noticed decreased edema and is no longer wraping his L UE. He reports morning  stiffness in his Knuckles that decreases as he performs AROM ex's during the day.   Pertinent History see epic, no shoulder movement, cleared for gentle ROM to elbow, forearm and wrist (Perform gentle AAROM at elbow and forearm to minimize risk of pt moving left shoulder,, A/ROM wrist and hand   Patient Stated Goals regain use of LUE   Currently in Pain? Yes   Pain Score 2    Pain Location Shoulder   Pain Orientation Left   Pain Descriptors / Indicators Aching;Discomfort   Pain Type Acute pain   Pain Onset 1 to 4 weeks ago   Pain Frequency Intermittent   Aggravating Factors  Movements, malpositioning   Pain Relieving Factors Proper positioning   Effect of Pain on Daily Activities Limits daily activities         Reviewed, performed and updated HEP for left hand, wrist, forearm/elbow. Pt with noted tenderness or "tightness" left extensor muscle mass and just proximal to lateral epicondyle and he appears to have increased symptoms with active forearm pronation and active wrist extension. Pt was educated to d/c these ex's at this time. 10 min of manual therapy was performed - L extensor muscle mass and lateral epicondyle for cross friction/trigger point release in clinic today. Pt cont with No shoulder movement allowed at this time. Note written and sent to MD with request  to clarify when pt may begin shoulder ROM and progression of home program. Pt plans to take to MD appointment on 12/19/16.                     OT Education - 12/18/16 1139    Education provided Yes   Education Details Reviewed and performed HEP for tendon gliding, A/ROM Left elbow, wrist and hand. Pt with some tenderness along extensor muscle mass L forearm just proximal to lateral epicondyle noted. Pt was educated to decreased/d/c active wrist extension ex's and forearm pronation as these ex's appear to increase his symptoms. Pt also reports sleeping w/o LUE sling. He was educated and reinforced importance of  Not moving shoulder while exerciing and while sleeping - recommend using sling as prescribed by MD until he is instructed otherwise. Note sent with pt to MD for clarification of when he may begin shoulder ROM.   Person(s) Educated Patient   Methods Explanation;Demonstration;Tactile cues;Verbal cues  Handout for tendon gliding issued and reviewed   Comprehension Verbalized understanding;Returned demonstration;Verbal cues required          OT Short Term Goals - 12/18/16 1149      OT SHORT TERM GOAL #1   Title I with inital HEP. due 01/09/17   Time 4   Period Weeks   Status On-going     OT SHORT TERM GOAL #2   Title Pt will increase left elbow A/ROM flexion to 120* and extension to -15 in prep for functional use.   Time 4   Period Weeks   Status On-going     OT SHORT TERM GOAL #3   Title Pt will increase LUE supination to 75* in prep for functional use during ADLS.   Time 4   Period Weeks   Status On-going     OT SHORT TERM GOAL #4   Title Pt will verbalize understanding of LUE positioning to minimize edema, pain and risk for further injury.   Time 4   Period Weeks   Status On-going     OT SHORT TERM GOAL #5   Title Pt will perform all basic ADLS modified independently.   Time 4   Period Weeks   Status On-going           OT Long Term Goals - 12/10/16 1625      OT LONG TERM GOAL #1   Title I with updated HEP (including shoulder ROM once cleared by MD.)   Time 8   Period Weeks   Status New     OT LONG TERM GOAL #2   Title Pt will demonstrate ability to retrieve a lightweight object at 120 shoulder flexion with pain less than or equal to 3/10.   Time 8   Period Weeks   Status New     OT LONG TERM GOAL #3   Title Pt will demonstrate ability to perform simulated work activities.   Time 8   Period Weeks   Status New     OT LONG TERM GOAL #4   Title Pt will resume use of LUE as his domiant hand 90% of the time for ADLS/IADLS, and work activities.   Time 8    Period Weeks   Status New     OT LONG TERM GOAL #5   Title Pt will demonstrate elbow and forearm AROM WFLS for the performance of work activities.   Time 8   Period Weeks   Status New  Long Term Additional Goals   Additional Long Term Goals Yes           Long Term Clinic Goals - 12/13/16 1239      CC Long Term Goal  #1   Title Pt and family will be independent in manual lymph drainage and compression bandaging to control swelling of left arm.   Status Achieved     CC Long Term Goal  #2   Title Pt will have a decreased in circumferential measurement of left arm at 10 cm proximal to ulnar styloid to 23 cm    Baseline 25.5 on 12/04/2016, 23 on 3/9/ 2018   Status Achieved            Plan - 12/18/16 1144    Clinical Impression Statement Pt is progressing toward goals for ROM to Left elbow, forearm, wrist and hand. He demonstrates decreased edema and is no longer using lymphedema wraps. He has c/o "tightness" in left forearm extensor muscle mass & just proximal to lateral epicondyle. These symptoms appear to be exacerbated when doing active wrist extension and forearm pronation ex's therefore, he should benefit from d/c of these ex's for now. He verbalized understanding of this. A note was written to Dr Tonita Cong and pt will take to his f/u appointment on 12/19/16, for clarification of progression of therapy (specifically precautions of no movement of left shoulder).    Rehab Potential Good   OT Frequency 3x / week   OT Duration 8 weeks   OT Treatment/Interventions Self-care/ADL training;Moist Heat;Fluidtherapy;DME and/or AE instruction;Splinting;Patient/family education;Therapeutic exercises;Contrast Bath;Therapeutic exercise;Ultrasound;Therapeutic activities;Passive range of motion;Neuromuscular education;Cryotherapy;Electrical Stimulation;Energy conservation;Parrafin;Manual Therapy   Plan Gentle ROM elbow, forearm and hand. Check to see if Dr Tonita Cong gave pt new orders/when to begin  ROM L shoulder and reassess L forearm/extensor muscle mass "tightness" (Have d/c'd active wrist extension and forearm pronation as theses activities increased symptoms).   Consulted and Agree with Plan of Care Patient      Patient will benefit from skilled therapeutic intervention in order to improve the following deficits and impairments:  Decreased coordination, Decreased range of motion, Increased edema, Impaired UE functional use, Pain, Decreased strength  Visit Diagnosis: Stiffness of left elbow, not elsewhere classified  Acute pain of left shoulder  Stiffness of left shoulder, not elsewhere classified  Muscle weakness (generalized)  Localized swelling, mass and lump, left upper limb    Problem List Patient Active Problem List   Diagnosis Date Noted  . Primary osteoarthritis of right knee 05/30/2016  . Right knee DJD 05/30/2016  . GERD (gastroesophageal reflux disease) 07/28/2014  . Left knee DJD 07/21/2014  . Osteoarthritis 10/09/2009  . Asthma 08/17/2008  . SINUSITIS 09/15/2007  . Essential hypertension 06/19/2007  . Allergic rhinitis 06/19/2007    Percell Miller Ardath Sax, OTR/L 12/18/2016, 11:51 AM  [facility] [address] [place], [place], [address] Phone: [phone]   Fax:  [phone]  Name: Charles Mccoy MRN: [id number] Date of Birth: 06-28-1951

## 2016-12-20 ENCOUNTER — Ambulatory Visit: Payer: Worker's Compensation | Attending: Specialist | Admitting: Occupational Therapy

## 2016-12-20 DIAGNOSIS — R2232 Localized swelling, mass and lump, left upper limb: Secondary | ICD-10-CM | POA: Diagnosis present

## 2016-12-20 DIAGNOSIS — M25612 Stiffness of left shoulder, not elsewhere classified: Secondary | ICD-10-CM | POA: Insufficient documentation

## 2016-12-20 DIAGNOSIS — M6281 Muscle weakness (generalized): Secondary | ICD-10-CM | POA: Diagnosis present

## 2016-12-20 DIAGNOSIS — M25512 Pain in left shoulder: Secondary | ICD-10-CM | POA: Diagnosis present

## 2016-12-20 DIAGNOSIS — M25622 Stiffness of left elbow, not elsewhere classified: Secondary | ICD-10-CM | POA: Diagnosis present

## 2016-12-20 NOTE — Therapy (Addendum)
[facility] [address] [place], [place], [address] Phone: [phone]   Fax:  [phone]  Occupational Therapy Treatment  Patient Details  Name: Charles Mccoy MRN: [id number] Date of Birth: 05-24-1951 Referring Provider: Dr. Tonita Cong Pearline Cables. Phoebe Sharps)  Encounter Date: 12/20/2016      OT End of Session - 12/20/16 1241    Visit Number 4   Number of Visits 17   Date for OT Re-Evaluation 02/07/17   Authorization Type worker's comp   Authorization Time Period 12 vists auth   Authorization - Visit Number 4   Authorization - Number of Visits 12   OT Start Time [phone]   OT Stop Time 0845   OT Time Calculation (min) 39 min   Activity Tolerance Patient tolerated treatment well   Behavior During Therapy WFL for tasks assessed/performed      Past Medical History:  Diagnosis Date  . ALLERGIC RHINITIS 06/19/2007  . ASTHMA 08/17/2008  . Back pain 05/2016  . COPD (chronic obstructive pulmonary disease) ([place])   . GERD (gastroesophageal reflux disease)   . HYPERTENSION 06/19/2007  . Hypertension    meds controlling well  . OSTEOARTHRITIS 10/09/2009   wrists, knee.  . Pneumonia    hx of   . SINUSITIS 09/15/2007   recent issuses 2 weeks ago-clearing.    Past Surgical History:  Procedure Laterality Date  . cyst removed from neck      age 66   . KNEE ARTHROSCOPY     2 on left knee , 1 on right  . TOTAL KNEE ARTHROPLASTY Left 07/21/2014   Procedure: LEFT TOTAL KNEE ARTHROPLASTY;  Surgeon: Johnn Hai, MD;  Location: [facility];  Service: Orthopedics;  Laterality: Left;  . TOTAL KNEE ARTHROPLASTY Right 05/30/2016   Procedure: RIGHT TOTAL KNEE ARTHROPLASTY REPLACEMENT;  Surgeon: Susa Day, MD;  Location: [facility];  Service: Orthopedics;  Laterality: Right;    There were no vitals filed for this visit.      Subjective Assessment - 12/20/16 0807    Pertinent History see epic, no shoulder movement, cleared for gentle ROM  to elbow, forearm and wrist (Perform gentle AAROM at elbow and forearm to minimize risk of pt moving left shoulder,, A/ROM wrist and hand   Patient Stated Goals regain use of LUE   Currently in Pain? Yes   Pain Score 2    Pain Location Shoulder   Pain Orientation Left   Pain Descriptors / Indicators Aching;Discomfort   Pain Type Acute pain   Pain Onset 1 to 4 weeks ago   Pain Frequency Intermittent   Aggravating Factors  movements, malpositioning   Pain Relieving Factors proper positioning            A/ROM elbow flexion/extension, supination/ pronation, wrist flexion ext AA/ROM and tendon gliding exercising as well as finger abduction/ adduction while hotpack applied to left shoulder x 10 mins, no adverse reactions.. Therapist checked progress towards goals. Ice pack applied to left forearm at end of session x 8 mins due to tightness, no adverse reactions.Pt agrees with d/c A/ROM wrist flexion/ extension: 60/55 Grossly 90% composite finger flexion extension.                   OT Short Term Goals - 12/20/16 0819      OT SHORT TERM GOAL #1   Title I with inital HEP. due 01/09/17   Time 4   Period Weeks   Status Achieved  OT SHORT TERM GOAL #2   Title Pt will increase left elbow A/ROM flexion to 120* and extension to -15 in prep for functional use.   Time 4   Period Weeks   Status Partially Met  125*, -20     OT SHORT TERM GOAL #3   Title Pt will increase LUE supination to 75* in prep for functional use during ADLS.   Time 4   Status Not Met  65*     OT SHORT TERM GOAL #4   Title Pt will verbalize understanding of LUE positioning to minimize edema, pain and risk for further injury.   Time 4   Period Weeks   Status Achieved     OT SHORT TERM GOAL #5   Title Pt will perform all basic ADLS modified independently.   Time 4   Period Weeks   Status Not Met  min A           OT Long Term Goals - 12/20/16 5852      OT LONG TERM GOAL #1   Title I  with updated HEP (including shoulder ROM once cleared by MD.)   Time 8   Period Weeks   Status Not Met     OT LONG TERM GOAL #2   Title Pt will demonstrate ability to retrieve a lightweight object at 120 shoulder flexion with pain less than or equal to 3/10.   Time 8   Period Weeks   Status Not Met     OT LONG TERM GOAL #3   Title Pt will demonstrate ability to perform simulated work activities.   Time 8   Period Weeks   Status Not Met     OT LONG TERM GOAL #4   Title Pt will resume use of LUE as his domiant hand 90% of the time for ADLS/IADLS, and work activities.   Time 8   Period Weeks   Status Not Met     OT LONG TERM GOAL #5   Title Pt will demonstrate elbow and forearm AROM WFLS for the performance of work activities.   Time 8   Period Weeks   Status Not Met               Plan - 12/20/16 1237    Clinical Impression Statement Pt arrived today with note from MD requesting to wrap up OT as pt would start PT next week at his office for ROM to shoulder. (since Md did not specify precautions or stating shpoulder ROM with OT pt was instructed to await further instructions from PT regarding the shoulder next week and to continue addressing elbow-hand ROM)   Rehab Potential Good   OT Frequency 3x / week   OT Duration 8 weeks   OT Treatment/Interventions Self-care/ADL training;Moist Heat;Fluidtherapy;DME and/or AE instruction;Splinting;Patient/family education;Therapeutic exercises;Contrast Bath;Therapeutic exercise;Ultrasound;Therapeutic activities;Passive range of motion;Neuromuscular education;Cryotherapy;Electrical Stimulation;Energy conservation;Parrafin;Manual Therapy   Plan d/c OT   Consulted and Agree with Plan of Care Patient      Patient will benefit from skilled therapeutic intervention in order to improve the following deficits and impairments:  Decreased coordination, Decreased range of motion, Increased edema, Impaired UE functional use, Pain, Decreased  strength  Visit Diagnosis: Stiffness of left elbow, not elsewhere classified  Acute pain of left shoulder  Stiffness of left shoulder, not elsewhere classified  Muscle weakness (generalized)  Localized swelling, mass and lump, left upper limb   OCCUPATIONAL THERAPY DISCHARGE SUMMARY  Current functional level related to goals / functional  outcomes: See above. Pt did not meet all goals due to early d/c as pt is transferring care to MD office.   Remaining deficits: Decreased ROM, decreased strength, pain   Education / Equipment: Pt was educated regarding safe positioning of LUE and initial HEP. Pt vebalized understanding of all education Plan: Patient agrees to discharge.  Patient goals were not met. Patient is being discharged due to meeting the stated rehab goals.  ?????      Problem List Patient Active Problem List   Diagnosis Date Noted  . Primary osteoarthritis of right knee 05/30/2016  . Right knee DJD 05/30/2016  . GERD (gastroesophageal reflux disease) 07/28/2014  . Left knee DJD 07/21/2014  . Osteoarthritis 10/09/2009  . Asthma 08/17/2008  . SINUSITIS 09/15/2007  . Essential hypertension 06/19/2007  . Allergic rhinitis 06/19/2007    Andjela Wickes 12/20/2016, 12:47 PM  [facility] [address] [place], [place], [address] Phone: [phone]   Fax:  [phone]  Name: Charles Mccoy MRN: [id number] Date of Birth: 02-06-1951

## 2016-12-24 ENCOUNTER — Encounter: Payer: Self-pay | Admitting: Occupational Therapy

## 2016-12-25 ENCOUNTER — Encounter: Payer: Self-pay | Admitting: Occupational Therapy

## 2016-12-27 ENCOUNTER — Encounter: Payer: Self-pay | Admitting: Occupational Therapy

## 2016-12-30 ENCOUNTER — Encounter: Payer: Self-pay | Admitting: Occupational Therapy

## 2017-01-01 ENCOUNTER — Encounter: Payer: Self-pay | Admitting: Occupational Therapy

## 2017-01-02 ENCOUNTER — Encounter: Payer: Self-pay | Admitting: *Deleted

## 2017-01-07 ENCOUNTER — Encounter: Payer: Self-pay | Admitting: Occupational Therapy

## 2017-01-08 ENCOUNTER — Encounter: Payer: Self-pay | Admitting: Occupational Therapy

## 2017-01-10 ENCOUNTER — Encounter: Payer: Self-pay | Admitting: Occupational Therapy

## 2017-01-14 ENCOUNTER — Encounter: Payer: Self-pay | Admitting: Occupational Therapy

## 2017-01-15 ENCOUNTER — Encounter: Payer: Self-pay | Admitting: Occupational Therapy

## 2017-01-17 ENCOUNTER — Encounter: Payer: Self-pay | Admitting: Occupational Therapy

## 2017-01-20 ENCOUNTER — Encounter: Payer: Self-pay | Admitting: *Deleted

## 2017-01-22 ENCOUNTER — Encounter: Payer: Self-pay | Admitting: Occupational Therapy

## 2017-01-23 ENCOUNTER — Encounter: Payer: Self-pay | Admitting: *Deleted

## 2017-01-27 ENCOUNTER — Encounter: Payer: Self-pay | Admitting: *Deleted

## 2017-01-29 ENCOUNTER — Encounter: Payer: Self-pay | Admitting: Occupational Therapy

## 2017-01-31 ENCOUNTER — Encounter: Payer: Self-pay | Admitting: Occupational Therapy

## 2017-02-03 ENCOUNTER — Encounter: Payer: Self-pay | Admitting: *Deleted

## 2017-02-05 ENCOUNTER — Encounter: Payer: Self-pay | Admitting: Occupational Therapy

## 2017-02-07 ENCOUNTER — Encounter: Payer: Self-pay | Admitting: Occupational Therapy

## 2017-03-08 ENCOUNTER — Other Ambulatory Visit: Payer: Self-pay | Admitting: Internal Medicine

## 2017-06-26 ENCOUNTER — Encounter: Payer: Self-pay | Admitting: Internal Medicine

## 2017-10-02 ENCOUNTER — Encounter: Payer: Self-pay | Admitting: Internal Medicine

## 2017-10-15 ENCOUNTER — Ambulatory Visit (INDEPENDENT_AMBULATORY_CARE_PROVIDER_SITE_OTHER): Payer: 59 | Admitting: Internal Medicine

## 2017-10-15 ENCOUNTER — Encounter: Payer: Self-pay | Admitting: Internal Medicine

## 2017-10-15 VITALS — BP 140/82 | HR 64 | Temp 98.3°F | Ht 76.5 in | Wt 243.6 lb

## 2017-10-15 DIAGNOSIS — Z0000 Encounter for general adult medical examination without abnormal findings: Secondary | ICD-10-CM

## 2017-10-15 LAB — COMPREHENSIVE METABOLIC PANEL
ALK PHOS: 65 U/L (ref 39–117)
ALT: 16 U/L (ref 0–53)
AST: 11 U/L (ref 0–37)
Albumin: 4.4 g/dL (ref 3.5–5.2)
BILIRUBIN TOTAL: 0.8 mg/dL (ref 0.2–1.2)
BUN: 17 mg/dL (ref 6–23)
CALCIUM: 9 mg/dL (ref 8.4–10.5)
CO2: 28 meq/L (ref 19–32)
CREATININE: 0.9 mg/dL (ref 0.40–1.50)
Chloride: 101 mEq/L (ref 96–112)
GFR: 90.13 mL/min (ref >60.00)
Glucose, Bld: 96 mg/dL (ref 70–99)
Potassium: 4.3 mEq/L (ref 3.5–5.1)
Sodium: 139 mEq/L (ref 135–145)
TOTAL PROTEIN: 7.3 g/dL (ref 6.0–8.3)

## 2017-10-15 LAB — CBC WITH DIFFERENTIAL/PLATELET
BASOS ABS: 0 10*3/uL (ref 0.0–0.1)
BASOS PCT: 0.5 % (ref 0.0–3.0)
EOS ABS: 0.2 10*3/uL (ref 0.0–0.7)
Eosinophils Relative: 1.8 % (ref 0.0–5.0)
HCT: 49.8 % (ref 39.0–52.0)
HEMOGLOBIN: 16.4 g/dL (ref 13.0–17.0)
LYMPHS PCT: 17.6 % (ref 12.0–46.0)
Lymphs Abs: 1.8 10*3/uL (ref 0.7–4.0)
MCHC: 32.9 g/dL (ref 30.0–36.0)
MCV: 85.6 fl (ref 78.0–100.0)
MONO ABS: 0.9 10*3/uL (ref 0.1–1.0)
Monocytes Relative: 9.4 % (ref 3.0–12.0)
NEUTROS ABS: 7.1 10*3/uL (ref 1.4–7.7)
Neutrophils Relative %: 70.7 % (ref 43.0–77.0)
PLATELETS: 251 10*3/uL (ref 150.0–400.0)
RBC: 5.81 Mil/uL (ref 4.22–5.81)
RDW: 14.6 % (ref 11.5–15.5)
WBC: 10 10*3/uL (ref 4.0–10.5)

## 2017-10-15 LAB — LIPID PANEL
CHOLESTEROL: 180 mg/dL (ref 0–200)
HDL: 40.7 mg/dL (ref >39.00)
LDL Cholesterol: 121 mg/dL — ABNORMAL HIGH (ref 0–99)
NonHDL: 139.07
TRIGLYCERIDES: 89 mg/dL (ref 0.0–149.0)
Total CHOL/HDL Ratio: 4
VLDL: 17.8 mg/dL (ref 0.0–40.0)

## 2017-10-15 LAB — TSH: TSH: 2.49 u[IU]/mL (ref 0.35–4.50)

## 2017-10-15 LAB — PSA: PSA: 0.77 ng/mL (ref 0.10–4.00)

## 2017-10-15 MED ORDER — SPIRONOLACTONE 25 MG PO TABS: 25.0000 mg | ORAL_TABLET | Freq: Every day | ORAL | 1 refills | Status: DC

## 2017-10-15 MED ORDER — AMLODIPINE BESYLATE 10 MG PO TABS: 10.0000 mg | ORAL_TABLET | Freq: Every morning | ORAL | 1 refills | Status: DC

## 2017-10-15 MED ORDER — TAMSULOSIN HCL 0.4 MG PO CAPS: 0.4000 mg | ORAL_CAPSULE | Freq: Every day | ORAL | 3 refills | Status: DC

## 2017-10-15 NOTE — Progress Notes (Signed)
Subjective:    Patient ID: Charles Mccoy, male    DOB: [birth year], 67 y.o.   MRN: [id number]  HPI 67 year old patient who is seen today for a preventive health examination He has a history of essential hypertension which has been well controlled.  He has a history of asthma and allergic rhinitis and is followed by allergy medication.  He plans on allergy testing in the spring and will consider resuming immunotherapy. He has osteoarthritis and has had bilateral total knee replacement surgeries.  He recently has completed rehab due to a left shoulder fracture.  Generally doing well today  Complaining of paresthesias involving the right first second and third fingers  Allergies:  1) ! Ery-Tab (Erythromycin Base)   Past History:   Allergic rhinitis  Hypertension  Asthma ( Immunotherapy)  Osteoarthritis   Past Surgical History:   colonoscopy January 2007 2015   Family History:   father died at age 65 laryngeal cancer  mother died age 97  One older brother and sister are both in good health   Social History:   married two adult children are in good health  Works in the Engineer, maintenance (IT) as a Soil scientist for [employer]    Past Medical History:  Diagnosis Date  . ALLERGIC RHINITIS 06/19/2007  . ASTHMA 08/17/2008  . Back pain 05/2016  . COPD (chronic obstructive pulmonary disease) ([place])   . GERD (gastroesophageal reflux disease)   . HYPERTENSION 06/19/2007  . Hypertension    meds controlling well  . OSTEOARTHRITIS 10/09/2009   wrists, knee.  . Pneumonia    hx of   . SINUSITIS 09/15/2007   recent issuses 2 weeks ago-clearing.     Social History   Socioeconomic History  . Marital status: Married    Spouse name: Not on file  . Number of children: Not on file  . Years of education: Not on file  . Highest education level: Not on file  Social Needs  . Financial resource strain: Not on file  . Food insecurity - worry: Not on file  . Food insecurity - inability: Not on file  .  Transportation needs - medical: Not on file  . Transportation needs - non-medical: Not on file  Occupational History  . Not on file  Tobacco Use  . Smoking status: Former Smoker    Types: Cigarettes    Last attempt to quit: 10/07/2001    Years since quitting: 16.0  . Smokeless tobacco: Never Used  Substance and Sexual Activity  . Alcohol use: Yes    Alcohol/week: 3.6 oz    Types: 6 Cans of beer per week    Comment: beer daily  . Drug use: No  . Sexual activity: Yes  Other Topics Concern  . Not on file  Social History Narrative  . Not on file    Past Surgical History:  Procedure Laterality Date  . cyst removed from neck      age 24   . KNEE ARTHROSCOPY     2 on left knee , 1 on right  . TOTAL KNEE ARTHROPLASTY Left 07/21/2014   Procedure: LEFT TOTAL KNEE ARTHROPLASTY;  Surgeon: Johnn Hai, MD;  Location: [facility];  Service: Orthopedics;  Laterality: Left;  . TOTAL KNEE ARTHROPLASTY Right 05/30/2016   Procedure: RIGHT TOTAL KNEE ARTHROPLASTY REPLACEMENT;  Surgeon: Susa Day, MD;  Location: [facility];  Service: Orthopedics;  Laterality: Right;    Family History  Problem Relation Age of Onset  . Colon cancer Neg Hx   .  Esophageal cancer Neg Hx   . Rectal cancer Neg Hx   . Stomach cancer Neg Hx     Allergies  Allergen Reactions  . Erythromycin Base     REACTION: stomach irritation    Current Outpatient Medications on File Prior to Visit  Medication Sig Dispense Refill  . Fluticasone-Salmeterol (ADVAIR) 250-50 MCG/DOSE AEPB Inhale 1 puff into the lungs 2 (two) times daily.     Marland Kitchen linaclotide (LINZESS) 145 MCG CAPS capsule Take 145 mcg by mouth daily before breakfast.    . montelukast (SINGULAIR) 10 MG tablet TAKE 1 TABLET BY MOUTH AT BEDTIME 90 tablet 1  . omeprazole (PRILOSEC) 20 MG capsule TAKE 1 CAPSULE (20 MG TOTAL) BY MOUTH EVERY MORNING. 90 capsule 3   No current facility-administered medications on file prior to visit.     BP 140/82 (BP Location: Left Arm,  Patient Position: Sitting, Cuff Size: Normal)   Pulse 64   Temp 98.3 F (36.8 C)   Ht 6' 4.5" (1.943 m)   Wt 243 lb 9.6 oz (110.5 kg)   SpO2 94%   BMI 29.27 kg/m      Review of Systems  Constitutional: Negative for activity change, appetite change, chills, fatigue and fever.  HENT: Negative for congestion, dental problem, ear pain, hearing loss, mouth sores, rhinorrhea, sinus pressure, sneezing, tinnitus, trouble swallowing and voice change.   Eyes: Negative for photophobia, pain, redness and visual disturbance.  Respiratory: Negative for apnea, cough, choking, chest tightness, shortness of breath and wheezing.   Cardiovascular: Negative for chest pain, palpitations and leg swelling.  Gastrointestinal: Negative for abdominal distention, abdominal pain, anal bleeding, blood in stool, constipation, diarrhea, nausea, rectal pain and vomiting.  Genitourinary: Negative for decreased urine volume, difficulty urinating, discharge, dysuria, flank pain, frequency, genital sores, hematuria, penile swelling, scrotal swelling, testicular pain and urgency.  Musculoskeletal: Negative for arthralgias, back pain, gait problem, joint swelling, myalgias, neck pain and neck stiffness.  Skin: Negative for color change, rash and wound.  Neurological: Negative for dizziness, tremors, seizures, syncope, facial asymmetry, speech difficulty, weakness, light-headedness, numbness and headaches.  Hematological: Negative for adenopathy. Does not bruise/bleed easily.  Psychiatric/Behavioral: Negative for agitation, behavioral problems, confusion, decreased concentration, dysphoric mood, hallucinations, self-injury, sleep disturbance and suicidal ideas. The patient is not nervous/anxious.        Objective:   Physical Exam  Constitutional: He appears well-developed and well-nourished.  HENT:  Head: Normocephalic and atraumatic.  Right Ear: External ear normal.  Left Ear: External ear normal.  Nose: Nose normal.    Mouth/Throat: Oropharynx is clear and moist.  Eyes: Conjunctivae and EOM are normal. Pupils are equal, round, and reactive to light. No scleral icterus.  Neck: Normal range of motion. Neck supple. No JVD present. No thyromegaly present.  Cardiovascular: Regular rhythm, normal heart sounds and intact distal pulses. Exam reveals no gallop and no friction rub.  No murmur heard. Pulmonary/Chest: Effort normal and breath sounds normal. He exhibits no tenderness.  Abdominal: Soft. Bowel sounds are normal. He exhibits no distension and no mass. There is no tenderness.  Genitourinary: Prostate normal and penis normal.  Musculoskeletal: Normal range of motion. He exhibits no edema or tenderness.  Surgical scars both knee status post total knee replacement surgeries  Negative Tinel's  Lymphadenopathy:    He has no cervical adenopathy.  Neurological: He is alert. He has normal reflexes. No cranial nerve deficit. Coordination normal.  Skin: Skin is warm and dry. No rash noted.  Psychiatric: He has a  normal mood and affect. His behavior is normal.          Assessment & Plan:  Preventive health examination Probable mild right carpal tunnel syndrome.  Will observe at this point.  A wrist brace encouraged Essential hypertension stable Osteoarthritis  Laboratory studies reviewed No change in medical therapy Continue home blood pressure monitoring Follow-up 1 year or as needed Follow-up allergy medicine  Nyoka Cowden

## 2017-10-15 NOTE — Patient Instructions (Addendum)
Limit your sodium (Salt) intake  Please check your blood pressure on a regular basis.  If it is consistently greater than 140/85, please make an office appointment.    It is important that you exercise regularly, at least 20 minutes 3 to 4 times per week.  If you develop chest pain or shortness of breath seek  medical attention.   Carpal Tunnel Syndrome Carpal tunnel syndrome is a condition that causes pain in your hand and arm. The carpal tunnel is a narrow area located on the palm side of your wrist. Repeated wrist motion or certain diseases may cause swelling within the tunnel. This swelling pinches the main nerve in the wrist (median nerve). What are the causes? This condition may be caused by:  Repeated wrist motions.  Wrist injuries.  Arthritis.  A cyst or tumor in the carpal tunnel.  Fluid buildup during pregnancy.  Sometimes the cause of this condition is not known. What increases the risk? This condition is more likely to develop in:  People who have jobs that cause them to repeatedly move their wrists in the same motion, such as Art gallery manager.  Women.  People with certain conditions, such as: ? Diabetes. ? Obesity. ? An underactive thyroid (hypothyroidism). ? Kidney failure.  What are the signs or symptoms? Symptoms of this condition include:  A tingling feeling in your fingers, especially in your thumb, index, and middle fingers.  Tingling or numbness in your hand.  An aching feeling in your entire arm, especially when your wrist and elbow are bent for long periods of time.  Wrist pain that goes up your arm to your shoulder.  Pain that goes down into your palm or fingers.  A weak feeling in your hands. You may have trouble grabbing and holding items.  Your symptoms may feel worse during the night. How is this diagnosed? This condition is diagnosed with a medical history and physical exam. You may also have tests, including:  An electromyogram  (EMG). This test measures electrical signals sent by your nerves into the muscles.  X-rays.  How is this treated? Treatment for this condition includes:  Lifestyle changes. It is important to stop doing or modify the activity that caused your condition.  Physical or occupational therapy.  Medicines for pain and inflammation. This may include medicine that is injected into your wrist.  A wrist splint.  Surgery.  Follow these instructions at home: If you have a splint:  Wear it as told by your health care provider. Remove it only as told by your health care provider.  Loosen the splint if your fingers become numb and tingle, or if they turn cold and blue.  Keep the splint clean and dry. General instructions  Take over-the-counter and prescription medicines only as told by your health care provider.  Rest your wrist from any activity that may be causing your pain. If your condition is work related, talk to your employer about changes that can be made, such as getting a wrist pad to use while typing.  If directed, apply ice to the painful area: ? Put ice in a plastic bag. ? Place a towel between your skin and the bag. ? Leave the ice on for 20 minutes, 2-3 times per day.  Keep all follow-up visits as told by your health care provider. This is important.  Do any exercises as told by your health care provider, physical therapist, or occupational therapist. Contact a health care provider if:  You have  new symptoms.  Your pain is not controlled with medicines.  Your symptoms get worse. This information is not intended to replace advice given to you by your health care provider. Make sure you discuss any questions you have with your health care provider. Document Released: 09/20/2000 Document Revised: 02/01/2016 Document Reviewed: 02/08/2015 Elsevier Interactive Patient Education  [employer].

## 2017-10-16 LAB — HEPATITIS C ANTIBODY
Hepatitis C Ab: NONREACTIVE
SIGNAL TO CUT-OFF: 0.03 (ref <1.00)

## 2018-01-18 ENCOUNTER — Inpatient Hospital Stay (HOSPITAL_COMMUNITY)
Admission: EM | Admit: 2018-01-18 | Discharge: 2018-01-20 | DRG: 062 | Disposition: A | Discharge status: Home / Self Care | Payer: 59 | Attending: Neurology | Admitting: Neurology

## 2018-01-18 ENCOUNTER — Emergency Department (HOSPITAL_COMMUNITY): Payer: 59

## 2018-01-18 ENCOUNTER — Other Ambulatory Visit: Payer: Self-pay

## 2018-01-18 ENCOUNTER — Encounter (HOSPITAL_COMMUNITY): Payer: Self-pay | Admitting: Emergency Medicine

## 2018-01-18 DIAGNOSIS — R4781 Slurred speech: Secondary | ICD-10-CM | POA: Diagnosis not present

## 2018-01-18 DIAGNOSIS — Z79899 Other long term (current) drug therapy: Secondary | ICD-10-CM

## 2018-01-18 DIAGNOSIS — Z881 Allergy status to other antibiotic agents status: Secondary | ICD-10-CM

## 2018-01-18 DIAGNOSIS — J449 Chronic obstructive pulmonary disease, unspecified: Secondary | ICD-10-CM | POA: Diagnosis not present

## 2018-01-18 DIAGNOSIS — G8191 Hemiplegia, unspecified affecting right dominant side: Secondary | ICD-10-CM | POA: Diagnosis not present

## 2018-01-18 DIAGNOSIS — Z96652 Presence of left artificial knee joint: Secondary | ICD-10-CM | POA: Diagnosis present

## 2018-01-18 DIAGNOSIS — J309 Allergic rhinitis, unspecified: Secondary | ICD-10-CM | POA: Diagnosis not present

## 2018-01-18 DIAGNOSIS — Z87891 Personal history of nicotine dependence: Secondary | ICD-10-CM

## 2018-01-18 DIAGNOSIS — Z8673 Personal history of transient ischemic attack (TIA), and cerebral infarction without residual deficits: Secondary | ICD-10-CM | POA: Diagnosis not present

## 2018-01-18 DIAGNOSIS — E785 Hyperlipidemia, unspecified: Secondary | ICD-10-CM | POA: Diagnosis not present

## 2018-01-18 DIAGNOSIS — K219 Gastro-esophageal reflux disease without esophagitis: Secondary | ICD-10-CM | POA: Diagnosis not present

## 2018-01-18 DIAGNOSIS — I10 Essential (primary) hypertension: Secondary | ICD-10-CM | POA: Diagnosis present

## 2018-01-18 DIAGNOSIS — Z7289 Other problems related to lifestyle: Secondary | ICD-10-CM

## 2018-01-18 DIAGNOSIS — M1711 Unilateral primary osteoarthritis, right knee: Secondary | ICD-10-CM | POA: Diagnosis present

## 2018-01-18 DIAGNOSIS — J329 Chronic sinusitis, unspecified: Secondary | ICD-10-CM | POA: Diagnosis present

## 2018-01-18 DIAGNOSIS — R29810 Facial weakness: Secondary | ICD-10-CM | POA: Diagnosis not present

## 2018-01-18 DIAGNOSIS — I63412 Cerebral infarction due to embolism of left middle cerebral artery: Secondary | ICD-10-CM | POA: Diagnosis present

## 2018-01-18 DIAGNOSIS — R0989 Other specified symptoms and signs involving the circulatory and respiratory systems: Secondary | ICD-10-CM | POA: Diagnosis present

## 2018-01-18 DIAGNOSIS — I351 Nonrheumatic aortic (valve) insufficiency: Secondary | ICD-10-CM | POA: Diagnosis not present

## 2018-01-18 DIAGNOSIS — N400 Enlarged prostate without lower urinary tract symptoms: Secondary | ICD-10-CM | POA: Diagnosis present

## 2018-01-18 DIAGNOSIS — R29701 NIHSS score 1: Secondary | ICD-10-CM | POA: Diagnosis not present

## 2018-01-18 DIAGNOSIS — I639 Cerebral infarction, unspecified: Secondary | ICD-10-CM | POA: Diagnosis not present

## 2018-01-18 DIAGNOSIS — Z96653 Presence of artificial knee joint, bilateral: Secondary | ICD-10-CM | POA: Diagnosis not present

## 2018-01-18 DIAGNOSIS — I6300 Cerebral infarction due to thrombosis of unspecified precerebral artery: Secondary | ICD-10-CM | POA: Diagnosis not present

## 2018-01-18 LAB — RAPID URINE DRUG SCREEN, HOSP PERFORMED
AMPHETAMINES: NOT DETECTED
BENZODIAZEPINES: NOT DETECTED
Barbiturates: NOT DETECTED
COCAINE: NOT DETECTED
OPIATES: NOT DETECTED
TETRAHYDROCANNABINOL: NOT DETECTED

## 2018-01-18 LAB — I-STAT CHEM 8, ED
BUN: 20 mg/dL (ref 6–20)
CHLORIDE: 101 mmol/L (ref 101–111)
CREATININE: 1 mg/dL (ref 0.61–1.24)
Calcium, Ion: 1.04 mmol/L — ABNORMAL LOW (ref 1.15–1.40)
Glucose, Bld: 95 mg/dL (ref 65–99)
HEMATOCRIT: 51 % (ref 39.0–52.0)
Hemoglobin: 17.3 g/dL — ABNORMAL HIGH (ref 13.0–17.0)
POTASSIUM: 3.7 mmol/L (ref 3.5–5.1)
Sodium: 140 mmol/L (ref 135–145)
TCO2: 26 mmol/L (ref 22–32)

## 2018-01-18 LAB — URINALYSIS, ROUTINE W REFLEX MICROSCOPIC
BILIRUBIN URINE: NEGATIVE
GLUCOSE, UA: NEGATIVE mg/dL
HGB URINE DIPSTICK: NEGATIVE
KETONES UR: 5 mg/dL — AB
LEUKOCYTES UA: NEGATIVE
Nitrite: NEGATIVE
PH: 5 (ref 5.0–8.0)
PROTEIN: NEGATIVE mg/dL
Specific Gravity, Urine: 1.024 (ref 1.005–1.030)

## 2018-01-18 LAB — COMPREHENSIVE METABOLIC PANEL
ALBUMIN: 4.6 g/dL (ref 3.5–5.0)
ALT: 18 U/L (ref 17–63)
AST: 22 U/L (ref 15–41)
Alkaline Phosphatase: 63 U/L (ref 38–126)
Anion gap: 14 (ref 5–15)
BILIRUBIN TOTAL: 0.9 mg/dL (ref 0.3–1.2)
BUN: 20 mg/dL (ref 6–20)
CO2: 23 mmol/L (ref 22–32)
Calcium: 9.7 mg/dL (ref 8.9–10.3)
Chloride: 102 mmol/L (ref 101–111)
Creatinine, Ser: 1.09 mg/dL (ref 0.61–1.24)
GFR calc Af Amer: >60 mL/min (ref >60)
GFR calc non Af Amer: >60 mL/min (ref >60)
GLUCOSE: 98 mg/dL (ref 65–99)
POTASSIUM: 4 mmol/L (ref 3.5–5.1)
Sodium: 139 mmol/L (ref 135–145)
TOTAL PROTEIN: 8.8 g/dL — AB (ref 6.5–8.1)

## 2018-01-18 LAB — DIFFERENTIAL
BASOS ABS: 0 10*3/uL (ref 0.0–0.1)
Basophils Relative: 0 %
EOS ABS: 0.2 10*3/uL (ref 0.0–0.7)
Eosinophils Relative: 2 %
LYMPHS ABS: 3.3 10*3/uL (ref 0.7–4.0)
LYMPHS PCT: 26 %
Monocytes Absolute: 1.4 10*3/uL — ABNORMAL HIGH (ref 0.1–1.0)
Monocytes Relative: 11 %
NEUTROS PCT: 61 %
Neutro Abs: 7.6 10*3/uL (ref 1.7–7.7)

## 2018-01-18 LAB — PROTIME-INR
INR: 0.95
Prothrombin Time: 12.6 seconds (ref 11.4–15.2)

## 2018-01-18 LAB — I-STAT TROPONIN, ED: Troponin i, poc: 0.02 ng/mL (ref 0.00–0.08)

## 2018-01-18 LAB — CBC
HCT: 49 % (ref 39.0–52.0)
HEMOGLOBIN: 16.6 g/dL (ref 13.0–17.0)
MCH: 28.8 pg (ref 26.0–34.0)
MCHC: 33.9 g/dL (ref 30.0–36.0)
MCV: 85.1 fL (ref 78.0–100.0)
Platelets: 311 10*3/uL (ref 150–400)
RBC: 5.76 MIL/uL (ref 4.22–5.81)
RDW: 13.6 % (ref 11.5–15.5)
WBC: 12.6 10*3/uL — ABNORMAL HIGH (ref 4.0–10.5)

## 2018-01-18 LAB — CBG MONITORING, ED: GLUCOSE-CAPILLARY: 92 mg/dL (ref 65–99)

## 2018-01-18 LAB — APTT: APTT: 34 s (ref 24–36)

## 2018-01-18 LAB — ETHANOL: Alcohol, Ethyl (B): <10 mg/dL (ref <10)

## 2018-01-18 MED ORDER — SODIUM CHLORIDE 0.9 % IV SOLN
50.0000 mL | Freq: Once | INTRAVENOUS | Status: AC
  Administered 2018-01-18: 50 mL via INTRAVENOUS

## 2018-01-18 MED ORDER — ONDANSETRON HCL 4 MG/2ML IJ SOLN: 4.0000 mg | Freq: Three times a day (TID) | INTRAMUSCULAR | Status: DC | PRN

## 2018-01-18 MED ORDER — ALTEPLASE (STROKE) FULL DOSE INFUSION
90.0000 mg | Freq: Once | INTRAVENOUS | Status: AC
  Administered 2018-01-18: 90 mg via INTRAVENOUS
  Filled 2018-01-18: qty 100

## 2018-01-18 MED ORDER — LABETALOL HCL 5 MG/ML IV SOLN
INTRAVENOUS | Status: AC
  Filled 2018-01-18: qty 4

## 2018-01-18 NOTE — ED Provider Notes (Signed)
[facility] DEPT Provider Note   CSN: [id number] Arrival date & time: 01/18/18  2031     History   Chief Complaint Chief Complaint  Patient presents with  . Code Stroke    HPI Charles Mccoy is a 67 y.o. male.   Cerebrovascular Accident  This is a new problem. The current episode started less than 1 hour ago. The problem has not changed since onset.Pertinent negatives include no chest pain, no abdominal pain, no headaches and no shortness of breath. He has tried nothing for the symptoms.  Patient presents to the ED with cc of sudden onset Right sided weakness. Onset of sxs 15 minutes ago. He has a hx of Hypertension. He and his wife had margaritas. He was lying on his right side on the couch and when he got up he was not able to Move his right  Arm or walk on his right  Leg.  He denies other neuro sxs. He is not on any blood thinners. He has not hx of dm.  Past Medical History:  Diagnosis Date  . ALLERGIC RHINITIS 06/19/2007  . ASTHMA 08/17/2008  . Back pain 05/2016  . COPD (chronic obstructive pulmonary disease) ([place])   . GERD (gastroesophageal reflux disease)   . HYPERTENSION 06/19/2007  . Hypertension    meds controlling well  . OSTEOARTHRITIS 10/09/2009   wrists, knee.  . Pneumonia    hx of   . SINUSITIS 09/15/2007   recent issuses 2 weeks ago-clearing.    Patient Active Problem List   Diagnosis Date Noted  . Suspected stroke patient last known to be well 2 to 3 hours ago 01/18/2018  . Primary osteoarthritis of right knee 05/30/2016  . Right knee DJD 05/30/2016  . GERD (gastroesophageal reflux disease) 07/28/2014  . Left knee DJD 07/21/2014  . Osteoarthritis 10/09/2009  . Asthma 08/17/2008  . SINUSITIS 09/15/2007  . Essential hypertension 06/19/2007  . Allergic rhinitis 06/19/2007    Past Surgical History:  Procedure Laterality Date  . cyst removed from neck      age 73   . KNEE ARTHROSCOPY     2 on left knee , 1 on right  .  TOTAL KNEE ARTHROPLASTY Left 07/21/2014   Procedure: LEFT TOTAL KNEE ARTHROPLASTY;  Surgeon: Johnn Hai, MD;  Location: [facility];  Service: Orthopedics;  Laterality: Left;  . TOTAL KNEE ARTHROPLASTY Right 05/30/2016   Procedure: RIGHT TOTAL KNEE ARTHROPLASTY REPLACEMENT;  Surgeon: Susa Day, MD;  Location: [facility];  Service: Orthopedics;  Laterality: Right;        Home Medications    Prior to Admission medications   Medication Sig Start Date End Date Taking? Authorizing Provider  amLODipine (NORVASC) 10 MG tablet Take 1 tablet (10 mg total) by mouth every morning. 10/15/17   Marletta Lor, MD  Fluticasone-Salmeterol (ADVAIR) 250-50 MCG/DOSE AEPB Inhale 1 puff into the lungs 2 (two) times daily.     [provider]  linaclotide (LINZESS) 145 MCG CAPS capsule Take 145 mcg by mouth daily before breakfast.    [provider]  montelukast (SINGULAIR) 10 MG tablet TAKE 1 TABLET BY MOUTH AT BEDTIME 03/09/15   Marletta Lor, MD  omeprazole (PRILOSEC) 20 MG capsule TAKE 1 CAPSULE (20 MG TOTAL) BY MOUTH EVERY MORNING. 03/14/15   Marletta Lor, MD  spironolactone (ALDACTONE) 25 MG tablet Take 1 tablet (25 mg total) by mouth daily. 10/15/17   Marletta Lor, MD  tamsulosin (FLOMAX) 0.4 MG CAPS  capsule Take 1 capsule (0.4 mg total) by mouth daily. 10/15/17   Marletta Lor, MD    Family History Family History  Problem Relation Age of Onset  . Colon cancer Neg Hx   . Esophageal cancer Neg Hx   . Rectal cancer Neg Hx   . Stomach cancer Neg Hx     Social History Social History   Tobacco Use  . Smoking status: Former Smoker    Types: Cigarettes    Last attempt to quit: 10/07/2001    Years since quitting: 16.2  . Smokeless tobacco: Never Used  Substance Use Topics  . Alcohol use: Yes    Alcohol/week: 3.6 oz    Types: 6 Cans of beer per week    Comment: beer daily  . Drug use: No     Allergies   Erythromycin base   Review of  Systems Review of Systems  Respiratory: Negative for shortness of breath.   Cardiovascular: Negative for chest pain.  Gastrointestinal: Negative for abdominal pain.  Neurological: Negative for headaches.  Ten systems reviewed and are negative for acute change, except as noted in the HPI.     Physical Exam Updated Vital Signs BP (!) 145/102   Pulse 93   Temp 98.1 F (36.7 C)   Resp 19   Ht 6\' 4"  (1.93 m)   Wt 113.4 kg (250 lb)   SpO2 96%   BMI 30.43 kg/m   Physical Exam  Constitutional: He is oriented to person, place, and time. He appears well-developed and well-nourished. No distress.  tearful  HENT:  Head: Normocephalic and atraumatic.  Eyes: Conjunctivae are normal. No scleral icterus.  Neck: Normal range of motion. Neck supple.  Cardiovascular: Normal rate, regular rhythm and normal heart sounds.  Pulmonary/Chest: Effort normal and breath sounds normal. No respiratory distress.  Abdominal: Soft. There is no tenderness.  Musculoskeletal: He exhibits no edema.  Neurological: He is alert and oriented to person, place, and time. No cranial nerve deficit.  Speech is clear and goal oriented, follows commands Major Cranial nerves without deficit, no facial droop 4/5 strength in the  RUE/RLE; 5/5 strength in the LUE/LLE Sensation normal to light and sharp touch Moves extremities without ataxia, coordination intact Abnormal f-n on the R secondary to weakness Gait deferred   Skin: Skin is warm and dry. He is not diaphoretic.  Psychiatric: His behavior is normal.  Nursing note and vitals reviewed.    ED Treatments / Results  Labs (all labs ordered are listed, but only abnormal results are displayed) Labs Reviewed  CBC - Abnormal; Notable for the following components:      Result Value   WBC 12.6 (*)    All other components within normal limits  DIFFERENTIAL - Abnormal; Notable for the following components:   Monocytes Absolute 1.4 (*)    All other components within  normal limits  COMPREHENSIVE METABOLIC PANEL - Abnormal; Notable for the following components:   Total Protein 8.8 (*)    All other components within normal limits  URINALYSIS, ROUTINE W REFLEX MICROSCOPIC - Abnormal; Notable for the following components:   Ketones, ur 5 (*)    All other components within normal limits  I-STAT CHEM 8, ED - Abnormal; Notable for the following components:   Calcium, Ion 1.04 (*)    Hemoglobin 17.3 (*)    All other components within normal limits  ETHANOL  PROTIME-INR  APTT  RAPID URINE DRUG SCREEN, HOSP PERFORMED  I-STAT TROPONIN, ED  CBG  MONITORING, ED    EKG EKG Interpretation  Date/Time:  Sunday January 18 2018 20:35:56 EDT Ventricular Rate:  112 PR Interval:    QRS Duration: 105 QT Interval:  326 QTC Calculation: 445 R Axis:   -46 Text Interpretation:  Sinus tachycardia Inferior infarct, old Since last EKG, rate has increased Confirmed by Duffy Bruce [phone]) on 01/18/2018 9:41:31 PM   Radiology Ct Head Code Stroke Wo Contrast  Result Date: 01/18/2018 CLINICAL DATA:  Code stroke. Right-sided numbness since this evening. EXAM: CT HEAD WITHOUT CONTRAST TECHNIQUE: Contiguous axial images were obtained from the base of the skull through the vertex without intravenous contrast. COMPARISON:  None. FINDINGS: Brain: No evidence of acute infarction, hemorrhage, hydrocephalus, extra-axial collection or mass lesion/mass effect. Mild to moderate low-density in the cerebral white matter attributed to chronic small vessel ischemia given medical history, confluent in the biparietal region. Vascular: Atherosclerotic calcification.  No hyperdense vessel. Skull: No acute or aggressive finding. Incomplete C1 ring with hypertrophic anterior arch. Clivus appears hypoplastic. Sinuses/Orbits: No acute finding Other: These results were called by telephone at the time of interpretation on 01/18/2018 at 8:53 pm to Dr. Margarita Mail , who verbally acknowledged these  results. ASPECTS [facility] Stroke Program Early CT Score) - Ganglionic level infarction (caudate, lentiform nuclei, internal capsule, insula, M1-M3 cortex): 7 - Supraganglionic infarction (M4-M6 cortex): 3 Total score (0-10 with 10 being normal): 10 Mild motion degradation. IMPRESSION: 1. No acute finding.  ASPECTS is 10. 2. Moderate chronic small vessel ischemia in the cerebral white matter. Electronically Signed   By: Monte Fantasia M.D.   On: 01/18/2018 20:54    Procedures Procedures (including critical care time)  Medications Ordered in ED Medications  labetalol (NORMODYNE,TRANDATE) 5 MG/ML injection (  Not Given 01/18/18 2200)  ondansetron (ZOFRAN) injection 4 mg (has no administration in time range)  alteplase (ACTIVASE) 1 mg/mL infusion 90 mg (0 mg Intravenous Stopped 01/18/18 2246)    Followed by  0.9 %  sodium chloride infusion (0 mLs Intravenous Stopped 01/18/18 2248)    CRITICAL CARE Performed by: Margarita Mail Total critical care time: 65 minutes Critical care time was exclusive of separately billable procedures and treating other patients. Critical care was necessary to treat or prevent imminent or life-threatening deterioration. Critical care was time spent personally by me on the following activities: development of treatment plan with patient and/or surrogate as well as nursing, discussions with consultants, evaluation of patient's response to treatment, examination of patient, obtaining history from patient or surrogate, ordering and performing treatments and interventions, ordering and review of laboratory studies, ordering and review of radiographic studies, pulse oximetry and re-evaluation of patient's condition.   Initial Impression / Assessment and Plan / ED Course  I have reviewed the triage vital signs and the nursing notes.  Pertinent labs & imaging results that were available during my care of the patient were reviewed by me and considered in my medical decision  making (see chart for details).     Patient seen in the ED by teleneurology. The patient has been given TPA here in the emergency department.  The patient had improvement in his symptoms and has been accepted to the neuro ICU at [facility].  His blood pressure was treated here with IV labetalol.  Patient is stable throughout his ED visit here.  I reviewed his lab results and head CT.  His EKG shows sinus tachycardia.  Patient will be admitted to the stroke ICU unit.  Final Clinical Impressions(s) / ED  Diagnoses   Final diagnoses:  Suspected stroke patient last known to be well 2 to 3 hours ago    ED Discharge Orders    None       Margarita Mail, [place] 01/18/18 2323

## 2018-01-18 NOTE — Progress Notes (Signed)
Pharmacist Code Stroke Response  Paged at 20:38 Requested height weight via ED secretary at [facility] with EDP at 20:44, teleneurology assessment in progress Ordered stat height weight at 20:51  Notified to mix tPA at 21:27 Delivered tPA to RN at 21:33  Issues/delays encountered (if applicable):  Gretta Arab PharmD, BCPS Pager [phone] 01/18/2018 9:33 PM

## 2018-01-18 NOTE — ED Triage Notes (Signed)
Pt arriving POV with stroke like symptoms. Pt states he was laying on his right side and felt numbness in his right arm that has not gone away. Pt has minor right sided facial drooping noted. Pt A&O x4 and able to answer questions appropriately.

## 2018-01-18 NOTE — ED Provider Notes (Signed)
Medical screening examination/treatment/procedure(s) were conducted as a shared visit with non-physician practitioner(s) and myself.  I personally evaluated the patient during the encounter.  None 67 year old male presents with acute onset of right upper extremity weakness which began approximately 15 minutes prior to arrival.  Patient states he was lying on his couch on his right side and falling asleep.  He does admit to having a margarita this evening.  Stated he had trouble walking.  Denies any headache.  On exam here he is got 3 of 5 strength in his right upper extremity.  His speech is normal.  Possible right-sided facial droop.  Have ordered tele-neurology and head CT.   Lacretia Leigh, MD 01/18/18 2038

## 2018-01-18 NOTE — Consult Note (Signed)
[facility] TeleNeurology Consult Services    Impression: Acute Stroke    Likely subcortical  Alteplase is indicated. No absolute contraindications are present.     Patient does not meet LVO screening criteria ([no aphasia, neglect, gaze deviation, dense hemiparesis, or visual field deficits on exam), therefore advanced imaging is not indicated.      Metrics:?   Door time:?  2031 [facility] contacted:  2054 [facility] at bedside:  2100 (first attempt) 2112 actual connection Last known well (LKW): 20:15 NIHSS assessment time (start of exam):  2112 Verbal tpa order: 2123 Blood pressure prior to bolus: 162/95 Blood Glucose:   98 Needle time:  2145   Verbal Consent to tPA:   I have explained to the patient/family/guardian the nature of the patient's condition, the use of tPA fibrinolytic agent, and the benefits to be reasonably expected compared with alternative approaches. I have discussed the likelihood of major risks or complications of this procedure including (if applicable) but not limited to loss of limb function, brain damage, paralysis, hemorrhage, infection, complications from transfusion of blood components, drug reactions, blood clots and loss of life. I have also indicated that with any procedure there is always the possibility of an unexpected complication. I have explained the risks which include:?     1. Death, Stroke or permanent neurologic injury (paralysis, coma, etc)?   2. Worsening of stroke symptoms from swelling or bleeding in the brain?   3. Bleeding in other parts of the body?   4. Need for blood transfusions to replace blood or clotting factors?   5. Allergic reaction to medications?   6. Other unexpected complications?     All questions were answered and the patient/family/guardian express understanding of the treatment plan and consent to the procedure.   Our recommendations are outlined below.?   We will be seeing the patient back in follow up as  noted.?     Recommendations:     IV tPA - dose =  90 mg Routine post tPA monitoring including neuro checks and blood pressure control during/after treatment  Monitor blood pressure  Check blood pressure and NIHSS every 15 min for 2 h, then every 30 min for 6 h, and finally every hour for 16 h    Systolic greater than 867 OR diastolic greater than 619:  Option 1: Labetalol 10 mg IV for 1 - 2 min  May repeat or double labetalol every 10 min to maximum dose of 300 mg, or give initial labetalol dose, then start labetalol drip at 2 - 8 mg/min.  Option 2: Nicardipine 5 mg/h IV infusion as initial dose and titrate to desired effect by increasing 2.5 mg/h every 5 min to maximum of 15 mg/h;   If blood pressure is not controlled by labetolol or nicardipine, consider sodium nitroprusside.    Admission to ICU  CT brain 24 hours post tPA  NPO until swallowing screen performed and passed  No antiplatelet agents or anticoagulants (including heparin for DVT prophylaxis) in first 24 hours  No Foley catheter, nasogastric tube, arterial catheter or central venous catheter for 24 hr, unless absolutely necessary  Telemetry   Inpatient Neurology Consultation  Stroke evaluation as per inpatient neurology recommendations  Discussed with ED MD    ------------------------------------------------------------------------------    CC Stroke Alert   History of Present Illness   Patient is a?  67 yo M with h/o HTN He was LKW at 800pm when he lay down to watch tv. A short time later, he noted  he could not move his right arm.  His wife also noted right facial droop and slurred speech.  In the ED, his facial droop and slurred speech improved but right arm drift remains.   Patient denies recent stroke or head trauma; no active bleeding; no use of anticoagulants; no h/o ICH; no recent surgery  Diagnostics HCT is negative for ICH   Exam     NIHSS score:  1 1A: Level of Consciousness - Alert; keenly responsive  0 1B: Ask Month and Age - Both Questions Right 0 1C: 'Blink Eyes' & 'Squeeze Hands' - Performs Both Tasks 0 2: Test Horizontal Extraocular Movements - Normal 0 3: Test Visual Fields - No Visual Loss 0 4: Test Facial Palsy - Normal symmetry 0 5A: Test Left Arm Motor Drift - No Drift for 10 Seconds 0 5B: Test Right Arm Motor Drift - Drift, but doesn't hit bed +1 6A: Test Left Leg Motor Drift - No Drift for 5 Seconds 0 6B: Test Right Leg Motor Drift - No Drift for 5 Seconds 0 7: Test Limb Ataxia - No Ataxia 0 8: Test Sensation - Normal; No sensory loss 0 9: Test Language/Aphasia - Normal; No aphasia 0 10: Test Dysarthria - Normal 0 11: Test Extinction/Inattention - No abnormality 0   Medical Decision Making:   - Extensive number of diagnosis or management options are considered above.?   - Extensive amount of complex data reviewed.?   - High risk of complication and/or morbidity or mortality are associated with differential diagnostic considerations above.   - There may be Uncertain outcome and increased probability of prolonged functional impairment or high probability of severe prolonged functional impairment associated with some of these differential diagnosis.   Medical Data Reviewed:   1.Data reviewed include clinical labs, radiology,? Medical Tests;?   2.Tests results discussed w/performing or interpreting physician;?   3.Obtaining/reviewing old medical records;   4.Obtaining case history from another source;   5.Independent review of image, tracing or specimen.       Patient was informed the Neurology Consult would happen via telehealth (remote video) and consented to receiving care in this manner.

## 2018-01-19 ENCOUNTER — Inpatient Hospital Stay (HOSPITAL_COMMUNITY): Payer: 59

## 2018-01-19 ENCOUNTER — Encounter (HOSPITAL_COMMUNITY): Payer: Self-pay | Admitting: Radiology

## 2018-01-19 DIAGNOSIS — I351 Nonrheumatic aortic (valve) insufficiency: Secondary | ICD-10-CM

## 2018-01-19 DIAGNOSIS — I63412 Cerebral infarction due to embolism of left middle cerebral artery: Secondary | ICD-10-CM | POA: Diagnosis present

## 2018-01-19 DIAGNOSIS — I639 Cerebral infarction, unspecified: Secondary | ICD-10-CM

## 2018-01-19 DIAGNOSIS — I6300 Cerebral infarction due to thrombosis of unspecified precerebral artery: Secondary | ICD-10-CM

## 2018-01-19 LAB — MRSA PCR SCREENING: MRSA BY PCR: NEGATIVE

## 2018-01-19 LAB — GLUCOSE, CAPILLARY: Glucose-Capillary: 129 mg/dL — ABNORMAL HIGH (ref 65–99)

## 2018-01-19 LAB — LIPID PANEL
Cholesterol: 168 mg/dL (ref 0–200)
HDL: 39 mg/dL — AB (ref >40)
LDL CALC: 113 mg/dL — AB (ref 0–99)
TRIGLYCERIDES: 79 mg/dL (ref <150)
Total CHOL/HDL Ratio: 4.3 RATIO
VLDL: 16 mg/dL (ref 0–40)

## 2018-01-19 LAB — ECHOCARDIOGRAM COMPLETE
Height: 76 in
WEIGHTICAEL: 3940.06 [oz_av]

## 2018-01-19 LAB — HEMOGLOBIN A1C
Hgb A1c MFr Bld: 5.4 % (ref 4.8–5.6)
Mean Plasma Glucose: 108.28 mg/dL

## 2018-01-19 MED ORDER — ACETAMINOPHEN 325 MG PO TABS: 650.0000 mg | ORAL_TABLET | ORAL | Status: DC | PRN

## 2018-01-19 MED ORDER — ASPIRIN EC 81 MG PO TBEC
81.0000 mg | DELAYED_RELEASE_TABLET | Freq: Every day | ORAL | Status: DC
  Administered 2018-01-19 – 2018-01-20 (×2): 81 mg via ORAL
  Filled 2018-01-19 (×2): qty 1

## 2018-01-19 MED ORDER — PANTOPRAZOLE SODIUM 40 MG PO TBEC
40.0000 mg | DELAYED_RELEASE_TABLET | Freq: Every day | ORAL | Status: DC
  Administered 2018-01-19: 40 mg via ORAL
  Filled 2018-01-19: qty 1

## 2018-01-19 MED ORDER — ACETAMINOPHEN 160 MG/5ML PO SOLN: 650.0000 mg | ORAL | Status: DC | PRN

## 2018-01-19 MED ORDER — PANTOPRAZOLE SODIUM 40 MG IV SOLR: 40.0000 mg | Freq: Every day | INTRAVENOUS | Status: DC

## 2018-01-19 MED ORDER — STROKE: EARLY STAGES OF RECOVERY BOOK
Freq: Once | Status: AC
  Administered 2018-01-19: 03:00:00
  Filled 2018-01-19: qty 1

## 2018-01-19 MED ORDER — ATORVASTATIN CALCIUM 40 MG PO TABS
40.0000 mg | ORAL_TABLET | Freq: Every day | ORAL | Status: DC
  Administered 2018-01-19: 40 mg via ORAL
  Filled 2018-01-19 (×2): qty 1

## 2018-01-19 MED ORDER — ACETAMINOPHEN 650 MG RE SUPP: 650.0000 mg | RECTAL | Status: DC | PRN

## 2018-01-19 NOTE — H&P (Addendum)
Chief Complaint:  Sudden onset right side weakness  History obtained from: Patient and Chart     HPI:                                                                                                                                       Charles Mccoy is an 67 y.o. male with PMH of HTN, HLD presents to [facility] ER as a stroke alert.   He was watching TV while around 8.15 pm noticed his right arm felt numb and when he got up noticed it was not moving. He also had slurred speech. He went to the hospital [facility] ER) and was stroke alerted.  Tele stroke neurology was consulted and he received ivTPA  9.45pm 90mg  dose.  He is compliant with BP meds. Does not take aspirin. Has borderline high LDL, not on statin.   Date last known well: 4.14.19 Time last known well: 8.15 pm tPA Given: yes Baseline MRS 0    Past Medical History:  Diagnosis Date  . ALLERGIC RHINITIS 06/19/2007  . ASTHMA 08/17/2008  . Back pain 05/2016  . COPD (chronic obstructive pulmonary disease) ([place])   . GERD (gastroesophageal reflux disease)   . HYPERTENSION 06/19/2007  . Hypertension    meds controlling well  . OSTEOARTHRITIS 10/09/2009   wrists, knee.  . Pneumonia    hx of   . SINUSITIS 09/15/2007   recent issuses 2 weeks ago-clearing.    Past Surgical History:  Procedure Laterality Date  . cyst removed from neck      age 47   . KNEE ARTHROSCOPY     2 on left knee , 1 on right  . TOTAL KNEE ARTHROPLASTY Left 07/21/2014   Procedure: LEFT TOTAL KNEE ARTHROPLASTY;  Surgeon: Johnn Hai, MD;  Location: [facility] ORS;  Service: Orthopedics;  Laterality: Left;  . TOTAL KNEE ARTHROPLASTY Right 05/30/2016   Procedure: RIGHT TOTAL KNEE ARTHROPLASTY REPLACEMENT;  Surgeon: Susa Day, MD;  Location: [facility] ORS;  Service: Orthopedics;  Laterality: Right;    Family History  Problem Relation Age of Onset  . Colon cancer Neg Hx   . Esophageal cancer Neg Hx   . Rectal cancer Neg Hx   . Stomach cancer Neg Hx    Social History:   reports that he quit smoking about 16 years ago. His smoking use included cigarettes. He has never used smokeless tobacco. He reports that he drinks about 3.6 oz of alcohol per week. He reports that he does not use drugs.  Allergies:  Allergies  Allergen Reactions  . Erythromycin Base     REACTION: stomach irritation    Medications:  I reviewed home medications   ROS:                                                                                                                                     14 systems reviewed and negative except above   Examination:                                                                                                      General: Appears well-developed and well-nourished.  Psych: Affect appropriate to situation Eyes: No scleral injection HENT: No OP obstrucion Head: Normocephalic.  Cardiovascular: Normal rate and regular rhythm.  Respiratory: Effort normal and breath sounds normal to anterior ascultation GI: Soft.  No distension. There is no tenderness.  Skin: WDI   Neurological Examination Mental Status: Alert, oriented, thought content appropriate.  Speech fluent without evidence of aphasia. Able to follow 3 step commands without difficulty. Cranial Nerves: II: Visual fields grossly normal,  III,IV, VI: ptosis not present, extra-ocular motions intact bilaterally, pupils equal, round, reactive to light and accommodation V,VII: smile symmetric, facial light touch sensation normal bilaterally VIII: hearing normal bilaterally IX,X: uvula rises symmetrically XI: bilateral shoulder shrug XII: midline tongue extension Motor: Right : Upper extremity   4/5    Left:     Upper extremity   5/5  Lower extremity   5/5     Lower extremity   5/5 Tone and bulk:normal tone throughout; no atrophy noted Sensory: Pinprick and light touch  intact throughout, bilaterally Deep Tendon Reflexes: 2+ and symmetric throughout Plantars: Right: downgoing   Left: downgoing Cerebellar: mild right upper extremity dysmetria  Gait: normal gait and station     Lab Results: Basic Metabolic Panel: Recent Labs  Lab 01/18/18 01/12/34 01/18/18 2047  NA 139 140  K 4.0 3.7  CL 102 101  CO2 23  --   GLUCOSE 98 95  BUN 20 20  CREATININE 1.09 1.00  CALCIUM 9.7  --     CBC: Recent Labs  Lab 01/18/18 01-12-34 01/18/18 2047  WBC 12.6*  --   NEUTROABS 7.6  --   HGB 16.6 17.3*  HCT 49.0 51.0  MCV 85.1  --   PLT 311  --     Coagulation Studies: Recent Labs    01/18/18 January 12, 2034  LABPROT 12.6  INR 0.95    Imaging: Ct Head Code Stroke Wo Contrast  Result Date: 01/18/2018 CLINICAL DATA:  Code stroke. Right-sided numbness since this evening. EXAM: CT HEAD WITHOUT CONTRAST  TECHNIQUE: Contiguous axial images were obtained from the base of the skull through the vertex without intravenous contrast. COMPARISON:  None. FINDINGS: Brain: No evidence of acute infarction, hemorrhage, hydrocephalus, extra-axial collection or mass lesion/mass effect. Mild to moderate low-density in the cerebral white matter attributed to chronic small vessel ischemia given medical history, confluent in the biparietal region. Vascular: Atherosclerotic calcification.  No hyperdense vessel. Skull: No acute or aggressive finding. Incomplete C1 ring with hypertrophic anterior arch. Clivus appears hypoplastic. Sinuses/Orbits: No acute finding Other: These results were called by telephone at the time of interpretation on 01/18/2018 at 8:53 pm to Dr. Margarita Mail , who verbally acknowledged these results. ASPECTS [facility] Stroke Program Early CT Score) - Ganglionic level infarction (caudate, lentiform nuclei, internal capsule, insula, M1-M3 cortex): 7 - Supraganglionic infarction (M4-M6 cortex): 3 Total score (0-10 with 10 being normal): 10 Mild motion degradation. IMPRESSION: 1. No  acute finding.  ASPECTS is 10. 2. Moderate chronic small vessel ischemia in the cerebral white matter. Electronically Signed   By: Monte Fantasia M.D.   On: 01/18/2018 20:54     ASSESSMENT AND PLAN  44 y male with PMH of HTN,HLD who presents with sudden onset Right hemiparesis. He recevied IVtPA and transported to [facility] ICU for admission for further workup  Acute ischemic stroke s/p tPA  Plan:  # MRI of the brain without contrast #CTA head and neck #Transthoracic Echo  # Hold Antiplatelet until 24 hrs #Start or continue Atorvastatin 80 mg/other high intensity statin # BP goal: permissive HTN upto 150 systolic, PRNs above 21 # HBAIC and Lipid profile # Telemetry monitoring # Frequent neuro checks # NPO until passes stroke swallow screen  Please page stroke NP  Or  PA  Or MD from 8am -4 pm  as this patient from this time will be  followed by the stroke.   You can look them up on [email or web address]  Password [email or web address]    This patient is neurologically critically ill due to stroke s/p TPA.  He is at risk for significant risk of neurological worsening from cerebral edema,  death from brain herniation, heart failure, hemorrhagic conversion, infection, respiratory failure and seizure. This patient's care requires constant monitoring of vital signs, hemodynamics, respiratory and cardiac monitoring, review of multiple databases, neurological assessment, discussion with family, other specialists and medical decision making of high complexity.  I spent  40 minutes of neurocritical time in the care of this patient.      Emonee Winkowski [facility] Pager Number [phone]

## 2018-01-19 NOTE — Progress Notes (Signed)
OT Cancellation Note  Patient Details Name: Krikor Willet MRN: [id number] DOB: [birth year]   Cancelled Treatment:    Reason Eval/Treat Not Completed: Active bedrest order;Patient not medically ready. Pt with strict bedrest orders s/p tPA administration 21:45 last evening (01/18/18). Will check back as appropriate.   Norman Herrlich, MS OTR/L  Pager: [phone]  Norman Herrlich 01/19/2018, 7:55 AM

## 2018-01-19 NOTE — Progress Notes (Signed)
Nutrition Education Note  RD consulted for nutrition education regarding a Heart Healthy diet. Patient admitted on 4/14 s/p stroke. Plans for TEE tomorrow.   Lipid Panel     Component Value Date/Time   CHOL 168 01/19/2018 1015   TRIG 79 01/19/2018 1015   HDL 39 (L) 01/19/2018 1015   CHOLHDL 4.3 01/19/2018 1015   VLDL 16 01/19/2018 1015   LDLCALC 113 (H) 01/19/2018 1015    RD provided "Stroke Nutrition Therapy" handout from the Academy of [employer]etics. Reviewed patient's dietary recall. Provided examples on ways to decrease sodium and fat intake in diet. Discouraged intake of processed foods and use of salt shaker. Encouraged fresh fruits and vegetables as well as whole grain sources of carbohydrates to maximize fiber intake. Teach back method used.  Expect good compliance.  Body mass index is 29.97 kg/m. Pt meets criteria for overweight based on current BMI.  Current diet order is heart healthy, patient is consuming approximately 50-75% of meals at this time. Labs and medications reviewed. No further nutrition interventions warranted at this time. RD contact information provided. If additional nutrition issues arise, please re-consult RD.   Molli Barrows, RD, LDN, [place] Pager [phone] After Hours Pager [phone]

## 2018-01-19 NOTE — Progress Notes (Signed)
  Echocardiogram 2D Echocardiogram has been performed.  Lamon Rotundo T Donnisha Besecker 01/19/2018, 11:30 AM

## 2018-01-19 NOTE — Progress Notes (Signed)
Carotid duplex prelim: 1-39% ICA stenosis.  Charles Mccoy Eunice, RDMS, RVT   

## 2018-01-19 NOTE — Evaluation (Signed)
Physical Therapy Evaluation Patient Details Name: Charles Mccoy MRN: [id number] DOB: Jan 22, 1951 Today's Date: 01/19/2018   History of Present Illness  28 y male with PMH of HTN,HLD who presents with sudden onset Right hemiparesis. He recevied IVtPA and transported to [facility] ICU for admission for further workup  Clinical Impression   Pt admitted with above diagnosis. Pt currently with functional limitations due to the deficits listed below (see PT Problem List). Overall moving well, slight strength deficits R (non-dominant) side compared to L side;  Pt will benefit from skilled PT to increase their independence and safety with mobility to allow discharge to the venue listed below.    Charles Mccoy became quite engaged in a phone call at the end of my session, so I did not get to go over stroke education and PT goals with him.     Follow Up Recommendations Outpatient PT;Other (comment)(for higher level balance)    Equipment Recommendations       Recommendations for Other Services       Precautions / Restrictions Precautions Precaution Comments: Slight R sided weakness Restrictions Weight Bearing Restrictions: No      Mobility  Bed Mobility Overal bed mobility: Needs Assistance Bed Mobility: Supine to Sit     Supine to sit: Supervision Sit to supine: Supervision   General bed mobility comments: Supervision for safety  Transfers Overall transfer level: Needs assistance Equipment used: None Transfers: Sit to/from Stand Sit to Stand: Supervision         General transfer comment: Supervision for safety. Pt feels that "sense of equilibrium" is off.  Ambulation/Gait Ambulation/Gait assistance: Supervision Ambulation Distance (Feet): 150 Feet Assistive device: None Gait Pattern/deviations: Step-through pattern Gait velocity: approaching WNL   General Gait Details: Overall walking well, with no gross losses of balance; reports he feels like his "equilibrium is off"; able to  backwards walk and [place] with minguard assist  Stairs            Wheelchair Mobility    Modified Rankin (Stroke Patients Only) Modified Rankin (Stroke Patients Only) Pre-Morbid Rankin Score: No symptoms Modified Rankin: No significant disability     Balance Overall balance assessment: No apparent balance deficits (not formally assessed)                                           Pertinent Vitals/Pain Pain Assessment: No/denies pain    Home Living Family/patient expects to be discharged to:: Private residence Living Arrangements: Spouse/significant other Available Help at Discharge: Family;Available 24 hours/day Type of Home: House Home Access: Stairs to enter   [employer] of Steps: 1 (in back); 7+5 in front Home Layout: One level;Laundry or work area in [place]: Environmental consultant - 2 wheels;Cane - single point;Bedside commode      Prior Function Level of Independence: Independent         Comments: Working; driving; in sports Secondary school teacher Dominance   Dominant Hand: Left    Extremity/Trunk Assessment   Upper Extremity Assessment Upper Extremity Assessment: Defer to OT evaluation RUE Deficits / Details: Decreased strength as compared to L (4/5 grossly). Decreased sensation in medial nerve distribution in hand. Per pt this was present at baseline and he has been told that he has carpal tunnel.  RUE Sensation: decreased light touch(thumb, first, second digit) RUE Coordination: decreased fine motor;decreased gross motor(undershooting with finger to nose  testing)    Lower Extremity Assessment Lower Extremity Assessment: RLE deficits/detail RLE Deficits / Details: Noted slight strength deficits compared to LLE, not significantly effecting gait       Communication   Communication: No difficulties  Cognition Arousal/Alertness: Awake/alert Behavior During Therapy: WFL for tasks assessed/performed Overall  Cognitive Status: Within Functional Limits for tasks assessed                                        General Comments General comments (skin integrity, edema, etc.): Noted continued permissive hypertension    Exercises Other Exercises Other Exercises: Educated pt and wife concerning fine motor coordination activities to maximize safety and independence with ADL and IADL.    Assessment/Plan    PT Assessment Patient needs continued PT services  PT Problem List Decreased strength;Decreased activity tolerance;Decreased balance       PT Treatment Interventions DME instruction;Gait training;Stair training;Functional mobility training;Therapeutic activities;Therapeutic exercise;Balance training;Neuromuscular re-education;Patient/family education    PT Goals (Current goals can be found in the Care Plan section)  Acute Rehab PT Goals Patient Stated Goal: to get back to normal PT Goal Formulation: With patient Time For Goal Achievement: 01/26/18 Potential to Achieve Goals: Good    Frequency Min 4X/week   Barriers to discharge        Co-evaluation               AM-PAC PT "6 Clicks" Daily Activity  Outcome Measure Difficulty turning over in bed (including adjusting bedclothes, sheets and blankets)?: None Difficulty moving from lying on back to sitting on the side of the bed? : None Difficulty sitting down on and standing up from a chair with arms (e.g., wheelchair, bedside commode, etc,.)?: None Help needed moving to and from a bed to chair (including a wheelchair)?: None Help needed walking in hospital room?: A Little Help needed climbing 3-5 steps with a railing? : A Little 6 Click Score: 22    End of Session Equipment Utilized During Treatment: Gait belt Activity Tolerance: Patient tolerated treatment well Patient left: in chair;with call bell/phone within reach Nurse Communication: Mobility status PT Visit Diagnosis: Hemiplegia and  hemiparesis Hemiplegia - Right/Left: Right Hemiplegia - dominant/non-dominant: Non-dominant Hemiplegia - caused by: Cerebral infarction    Time: 1443-1540 PT Time Calculation (min) (ACUTE ONLY): 15 min   Charges:   PT Evaluation $PT Eval Low Complexity: 1 Low     PT G Codes:        Roney [name], PT  Acute Rehabilitation Services Pager [phone] Office [phone]   [place]ta [place] 01/19/2018, 3:45 PM

## 2018-01-19 NOTE — CV Procedure (Signed)
Attempted 2D Echo, Patient wanted to eat breakfast first,  Will try again at a later time.  Charles Mccoy

## 2018-01-19 NOTE — Evaluation (Signed)
Occupational Therapy Evaluation Patient Details Name: Charles Mccoy MRN: [id number] DOB: 1951/01/26 Today's Date: 01/19/2018    History of Present Illness 74 y male with PMH of HTN,HLD who presents with sudden onset Right hemiparesis. 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 He currently presents with decreased R UE strength, decreased R UE gross and fine motor coordination limiting ability to participate in self-care and IADL tasks. He requires min assist to complete fasteners during dressing tasks and supervision for standing grooming tasks. Pt would benefit from continued OT services while admitted to improve independence with ADL and functional mobility. Recommend neuro-outpatient OT services to maximize return to independence with work and IADL related tasks. Will continue to follow while admitted.     Follow Up Recommendations  Outpatient OT(neuro-outpatient OT)    Equipment Recommendations  None recommended by OT    Recommendations for Other Services       Precautions / Restrictions Precautions Precaution Comments: Slight R sided weakness Restrictions Weight Bearing Restrictions: No      Mobility Bed Mobility Overal bed mobility: Needs Assistance Bed Mobility: Supine to Sit;Sit to Supine     Supine to sit: Supervision Sit to supine: Supervision   General bed mobility comments: Supervision for safety  Transfers Overall transfer level: Needs assistance Equipment used: None Transfers: Sit to/from Stand Sit to Stand: Supervision         General transfer comment: Supervision for safety. Pt feels that "sense of equilibrium" is off.    Balance                                           ADL either performed or assessed with clinical judgement   ADL Overall ADL's : Needs  assistance/impaired Eating/Feeding: Set up;Sitting   Grooming: Supervision/safety;Standing;Wash/dry hands   Upper Body Bathing: Sitting;Supervision/ safety   Lower Body Bathing: Sit to/from stand;Supervison/ safety   Upper Body Dressing : Sitting;Minimal assistance Upper Body Dressing Details (indicate cue type and reason): assist for fasteners Lower Body Dressing: Sit to/from stand;Minimal assistance Lower Body Dressing Details (indicate cue type and reason): assist for fasteners Toilet Transfer: Supervision/safety;Ambulation   Toileting- Clothing Manipulation and Hygiene: Supervision/safety;Sit to/from stand       Functional mobility during ADLs: Supervision/safety General ADL Comments: Pt able to complete basic ADL tasks with supervision this session. He is very active and working in an active job. Pt limited by decreased fine and gross motor coordination as well as decreased R UE strength for IADL participation and completing of fasteners.      Vision Baseline Vision/History: Wears glasses Wears Glasses: At all times(glasses vs contacts) Patient Visual Report: No change from baseline Vision Assessment?: Yes Eye Alignment: Within Functional Limits Ocular Range of Motion: Within Functional Limits Alignment/Gaze Preference: Within Defined Limits Tracking/Visual Pursuits: Able to track stimulus in all quads without difficulty Saccades: Within functional limits Convergence: Within functional limits Visual Fields: No apparent deficits Additional Comments: No visual deficits noted on assessment and during functional tasks.      Perception Perception Spatial deficits: No deficits   Praxis Praxis Praxis tested?: Within functional limits    Pertinent Vitals/Pain Pain Assessment: No/denies pain     Hand Dominance Left   Extremity/Trunk Assessment Upper Extremity Assessment Upper  Extremity Assessment: RUE deficits/detail RUE Deficits / Details: Decreased strength as  compared to L (4/5 grossly). Decreased sensation in medial nerve distribution in hand. Per pt this was present at baseline and he has been told that he has carpal tunnel.  RUE Sensation: decreased light touch(thumb, first, second digit) RUE Coordination: decreased fine motor;decreased gross motor(undershooting with finger to nose testing)   Lower Extremity Assessment Lower Extremity Assessment: Defer to PT evaluation       Communication Communication Communication: No difficulties   Cognition Arousal/Alertness: Awake/alert Behavior During Therapy: WFL for tasks assessed/performed Overall Cognitive Status: Within Functional Limits for tasks assessed                                     General Comments  BP in supine, sitting, and standing as follows: 150/91, 145/95, 152/96    Exercises Exercises: Other exercises Other Exercises Other Exercises: Educated pt and wife concerning fine motor coordination activities to maximize safety and independence with ADL and IADL.    Shoulder Instructions      Home Living Family/patient expects to be discharged to:: Private residence Living Arrangements: Spouse/significant other Available Help at Discharge: Family;Available 24 hours/day Type of Home: House Home Access: Stairs to enter [employer] of Steps: 1 (in back); 7+5 in front   Home Layout: One level;Laundry or work area in [place] Shower/Tub: Occupational psychologist: [place]: Environmental consultant - 2 wheels;Cane - single point;Bedside commode          Prior Functioning/Environment Level of Independence: Independent        Comments: Working; driving        OT Problem List: Decreased strength;Decreased range of motion;Decreased activity tolerance      OT Treatment/Interventions: Self-care/ADL training;Therapeutic exercise;Energy conservation;DME and/or AE instruction;Therapeutic activities;Patient/family  education;Balance training    OT Goals(Current goals can be found in the care plan section) Acute Rehab OT Goals Patient Stated Goal: to get back to normal OT Goal Formulation: With patient Time For Goal Achievement: 02/02/18 Potential to Achieve Goals: Good ADL Goals Pt Will Perform Grooming: Independently;standing Pt Will Perform Upper Body Dressing: Independently;sitting(including fasteners) Pt/caregiver will Perform Home Exercise Program: Right Upper extremity;Increased strength;With written HEP provided;With Supervision(increased coordination) Additional ADL Goal #1: Pt will demonstrate improved fine motor coordination to open/close variety of containers independently.  OT Frequency: Min 2X/week   Barriers to D/C:            Co-evaluation              AM-PAC PT "6 Clicks" Daily Activity     Outcome Measure Help from another person eating meals?: None Help from another person taking care of personal grooming?: A Little Help from another person toileting, which includes using toliet, bedpan, or urinal?: A Little Help from another person bathing (including washing, rinsing, drying)?: A Little Help from another person to put on and taking off regular upper body clothing?: A Little Help from another person to put on and taking off regular lower body clothing?: A Little 6 Click Score: 19   End of Session Equipment Utilized During Treatment: Gait belt Nurse Communication: Mobility status(elevated BP)  Activity Tolerance: Patient tolerated treatment well Patient left: in bed;with call bell/phone within reach  OT Visit Diagnosis: Other abnormalities of gait and mobility (R26.89);Ataxia, unspecified (R27.0);Hemiplegia and hemiparesis Hemiplegia - Right/Left: Right Hemiplegia -  dominant/non-dominant: Non-Dominant                Time: 6742-5525 OT Time Calculation (min): 29 min Charges:  OT General Charges $OT Visit: 1 Visit OT Evaluation $OT Eval Moderate Complexity: 1  Mod OT Treatments $Self Care/Home Management : 8-22 mins G-Codes:     Norman Herrlich, MS OTR/L  Pager: [place] A Roslynn Holte 01/19/2018, 2:43 PM

## 2018-01-19 NOTE — Progress Notes (Signed)
MRI of the brain reveals right  MCA infarcts. Infarcts are of an embolic source. TEE and loop recorder insertion planned for  Tomorrow. Discussed test findings and plans with patient over the telephone. Also discussed with RN and Dr. Chaney Malling, MSN, APRN, ANVP-BC, AGPCNP-BC Advanced Practice Stroke Nurse [facility] for Schedule & Pager information 01/19/2018 3:04 PM

## 2018-01-19 NOTE — Progress Notes (Addendum)
0700 Bedside shift report, pt resting in bed, denies pain, SOB. Neuro check performed, weakness noted to right UE, pt c/o numbness to right hand thumb, 1st, 2nd digit. Pt also c/o numbness to sole of bilateral toes. Stated this is the same since admission. Fall precautions in place, [facility].   1100 Neuro here to see pt. Bedrest lifted. PT/OT called for consults. Awaiting MRI/MRA, echo, and [place]. Updated pt and wife with POC. WCTM.   1345 Pt down to MRI/MRA with RN and transport.   1415 Pt back to room, tolerated procedure well. Results called to RN, Dr. Leonie Man paged. No new orders received.   1500 New orders received for TEE/loop recorder in am. Pt spoke to NP via phone about POC of care. Pt up walking hallways with PT.   1530 Bedside shift report to Zigmund Daniel, RN. Pt sitting up in chair, NAD.

## 2018-01-19 NOTE — Plan of Care (Signed)
  Problem: Education: Goal: Knowledge of General Education information will improve Outcome: Progressing   Problem: Activity: Goal: Risk for activity intolerance will decrease Outcome: Progressing   Problem: Elimination: Goal: Will not experience complications related to bowel motility Outcome: Progressing   Problem: Safety: Goal: Ability to remain free from injury will improve Outcome: Progressing   Problem: Skin Integrity: Goal: Risk for impaired skin integrity will decrease Outcome: Progressing   Problem: Education: Goal: Knowledge of disease or condition will improve Outcome: Progressing Goal: Knowledge of secondary prevention will improve Outcome: Progressing Goal: Knowledge of patient specific risk factors addressed and post discharge goals established will improve Outcome: Progressing   Problem: Coping: Goal: Will verbalize positive feelings about self Outcome: Progressing Goal: Will identify appropriate support needs Outcome: Progressing

## 2018-01-19 NOTE — H&P (View-Only) (Signed)
STROKE TEAM PROGRESS NOTE   INTERVAL HISTORY His wife Charles Mccoy is at the bedside.  He reports his RA is much improved. It was "useless" yesterday, but back to normal today.  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  CBC:  CBC Latest Ref Rng & Units 01/18/2018 01/18/2018 10/15/2017  WBC 4.0 - 10.5 K/uL - 12.6(H) 10.0  Hemoglobin 13.0 - 17.0 g/dL 17.3(H) 16.6 16.4  Hematocrit 39.0 - 52.0 % 51.0 49.0 49.8  Platelets 150 - 400 K/uL - 311 251.0   Comprehensive Metabolic Panel:   CMP Latest Ref Rng & Units 01/18/2018 01/18/2018 10/15/2017  Glucose 65 - 99 mg/dL 95 98 96  BUN 6 - 20 mg/dL 20 20 17   Creatinine 0.61 - 1.24 mg/dL 1.00 1.09 0.90  Sodium 135 - 145 mmol/L 140 139 139  Potassium 3.5 - 5.1 mmol/L 3.7 4.0 4.3  Chloride 101 - 111 mmol/L 101 102 101  CO2 22 - 32 mmol/L - 23 28  Calcium 8.9 - 10.3 mg/dL - 9.7 9.0  Total Protein 6.5 - 8.1 g/dL - 8.8(H) 7.3  Total Bilirubin 0.3 - 1.2 mg/dL - 0.9 0.8  Alkaline Phos 38 - 126 U/L - 63 65  AST 15 - 41 U/L - 22 11  ALT 17 - 63 U/L - 18 16   Lipid Panel:     Component Value Date/Time   CHOL 168 01/19/2018 1015   TRIG 79 01/19/2018 1015   HDL 39 (L) 01/19/2018 1015   CHOLHDL 4.3 01/19/2018 1015   VLDL 16 01/19/2018 1015   LDLCALC 113 (H) 01/19/2018 1015   HgbA1c:  Lab Results  Component Value Date   HGBA1C 5.4 01/19/2018   Urine Drug Screen:     Component Value Date/Time   LABOPIA NONE DETECTED 01/18/2018 2035   COCAINSCRNUR NONE DETECTED 01/18/2018 2035   LABBENZ NONE DETECTED 01/18/2018 2035   AMPHETMU NONE DETECTED 01/18/2018 2035   THCU NONE DETECTED 01/18/2018 2035   LABBARB NONE DETECTED 01/18/2018 2035    Alcohol Level     Component Value Date/Time   Charles Mccoy <10 01/18/2018 2035   Mr Charles Mccoy Head Wo Contrast  Result Date: 01/19/2018 CLINICAL DATA:  Right upper extremity hemiparesis and numbness. Slurred speech. Status post tPA. EXAM: MRA HEAD WITHOUT CONTRAST TECHNIQUE: Angiographic images of the  Circle of Willis were obtained using MRA technique without intravenous contrast. COMPARISON:  CT head without contrast 01/18/18. MRI brain from the same day. FINDINGS: Time-of-flight MRA circle-of-Willis demonstrates mild signal loss the anterior genu of the cavernous internal carotid arteries bilaterally. This may be artifactual. Normal signal is otherwise present through the ICA termini bilaterally. A1 and M1 segments are normal. The MCA bifurcations are intact. ACA and MCA branch vessels are normal. The left vertebral artery is the dominant vessel. Left PICA origin is visualized and normal. Right AICA is dominant. The basilar artery is normal. A prominent right posterior cerebral artery is present. PCA branch vessels are within normal limits. IMPRESSION: Normal variant MRA circle of Willis without significant proximal stenosis, aneurysm, or branch vessel occlusion. Electronically Signed   By: San Morelle M.D.   On: 01/19/2018 13:57   Mr Brain Wo Contrast  Result Date: 01/19/2018 CLINICAL DATA:  Acute onset of left upper extremity weakness and numbness last evening. Status post tPA. EXAM: MRI HEAD WITHOUT CONTRAST TECHNIQUE: Multiplanar, multiecho pulse sequences of the brain and surrounding structures were obtained without intravenous contrast. COMPARISON:  CT head without contrast  01/18/2018 FINDINGS: Brain: Scattered foci of restricted diffusion are present in the posterior left frontal and left parietal lobe. This corresponds with right upper extremity symptoms. Minimal white matter infarcts subjacent to this area follow cortical spinal tracts. No acute hemorrhage or mass lesion is present. T2 cortical signal changes corresponding to the areas of restricted diffusion suggest the time frame of at least 12 hours. Other periventricular and subcortical T2 hyperintensities bilaterally are moderately advanced for age. Moderate atrophy is present. Ventricles are proportionate to the degree of atrophy. The  internal auditory canals are within normal limits bilaterally. Brainstem and cerebellum are normal. Vascular: Flow is present in the major intracranial arteries. Skull and upper cervical spine: The skull base is within normal limits. The craniocervical junction is normal. Degenerative changes are present at C1-2. Midline sagittal structures are otherwise unremarkable. Sinuses/Orbits: A fluid level is present in the right maxillary sinus. The right maxillary sinuses shrunken with circumferential mucosal thickening mild mucosal thickening is present in the anterior ethmoid air cells bilaterally. There is some mucosal thickening in the left frontal sinus. Small bilateral mastoid effusions are present. No obstructing nasopharyngeal lesion is present. Globes and orbits are within normal limits. IMPRESSION: 1. Scattered small acute/subacute nonhemorrhagic cortical infarcts in the posterior left frontal lobe and anterior left parietal lobe near the vertex. These correspond with the patient's abrupt onset of right upper extremity weakness and numbness. 2. Subjacent white matter restricted diffusion corresponds with the cortical spinal tracts. 3. Atrophy and white matter disease in addition to these areas is moderately advanced for age. This likely reflects the sequela of chronic microvascular ischemia. These results will be called to the ordering clinician or representative by the Radiologist Assistant, and communication documented in the PACS or zVision Dashboard. Electronically Signed   By: San Morelle M.D.   On: 01/19/2018 14:08   Ct Head Code Stroke Wo Contrast  Result Date: 01/18/2018 CLINICAL DATA:  Code stroke. Right-sided numbness since this evening. EXAM: CT HEAD WITHOUT CONTRAST TECHNIQUE: Contiguous axial images were obtained from the base of the skull through the vertex without intravenous contrast. COMPARISON:  None. FINDINGS: Brain: No evidence of acute infarction, hemorrhage, hydrocephalus,  extra-axial collection or mass lesion/mass effect. Mild to moderate low-density in the cerebral white matter attributed to chronic small vessel ischemia given medical history, confluent in the biparietal region. Vascular: Atherosclerotic calcification.  No hyperdense vessel. Skull: No acute or aggressive finding. Incomplete C1 ring with hypertrophic anterior arch. Clivus appears hypoplastic. Sinuses/Orbits: No acute finding Other: These results were called by telephone at the time of interpretation on 01/18/2018 at 8:53 pm to Dr. Margarita Mail , who verbally acknowledged these results. ASPECTS [facility] Stroke Program Early CT Score) - Ganglionic level infarction (caudate, lentiform nuclei, internal capsule, insula, M1-M3 cortex): 7 - Supraganglionic infarction (M4-M6 cortex): 3 Total score (0-10 with 10 being normal): 10 Mild motion degradation. IMPRESSION: 1. No acute finding.  ASPECTS is 10. 2. Moderate chronic small vessel ischemia in the cerebral white matter. Electronically Signed   By: Monte Fantasia M.D.   On: 01/18/2018 20:54   2D Echocardiogram  - Left ventricle: The cavity size was moderately dilated. Systolic function was normal. The estimated ejection fraction was in the range of 60% to 65%. Wall motion was normal; there were no regional wall motion abnormalities. Left ventricular diastolic function parameters were normal. - Aortic valve: There was mild regurgitation. - Mitral valve: Calcified annulus. - Left atrium: The atrium was mildly dilated. - Right ventricle: The cavity  size was moderately dilated. Wall thickness was normal. - Atrial septum: There was increased thickness of the septum, consistent with lipomatous hypertrophy.  Carotid Doppler   There is 1-39% bilateral ICA stenosis. Vertebral artery flow is antegrade.    PHYSICAL EXAM Vitals:   01/19/18 0400 01/19/18 0500 01/19/18 0600 01/19/18 0700  BP: 137/85 (!) 147/90 (!) 126/101 136/85  Pulse: 64 64 64 61  Resp: 17 12 14 13    Temp:      TempSrc:      SpO2: 93% 93% 94% 94%  Weight:      Height:       General: Appears well-developed and well-nourished.  Psych: Affect appropriate to situation Eyes: No scleral injection HENT: No OP obstrucion Head: Normocephalic.  Cardiovascular: Normal rate and regular rhythm.  Respiratory: Effort normal and breath sounds normal to anterior ascultation Skin: WDI  Neurological Examination Mental Status: Alert, oriented, thought content appropriate.  Speech fluent without evidence of aphasia. Able to follow 3 step commands without difficulty. Cranial Nerves: II: Visual fields grossly normal,  III,IV, VI: ptosis not present, extra-ocular motions intact bilaterally, pupils equal, round, reactive to light and accommodation V,VII: smile symmetric, facial light touch sensation normal bilaterally VIII: hearing normal bilaterally IX,X: uvula rises symmetrically XI: bilateral shoulder shrug XII: midline tongue extension Motor: Right :  Upper extremity   5/5                                      Left:     Upper extremity   5/5             Lower extremity   5/5                                                  Lower extremity   5/5 Decreased FMM on the right.orbits left over right upper extremity. Mild weakness of right [employer].it right hip flexor weakness. Tone and bulk:normal tone throughout; no atrophy noted Sensory: Pinprick and light touch intact throughout, bilaterally Plantars: Right: downgoing                                Left: downgoing Cerebellar: mild right upper extremity dysmetria  Gait: normal gait and station    ASSESSMENT/PLAN [name] is a 67 y.o. male with history of HTN, HLD, COPD, GERD and back pain presenting with R arm numbness and slurred speech. He received IV tPA 01/18/2018 at 0945p.  Stroke:  left brain embolic infarct secondary to unknown source. Workup underway  Resultant mild R HP   Code Stroke CT head No acute stroke. Small vessel  disease. ASPECTS 10.     MRI  Scattered small left frontal and parietal infarcts. Small vessel disease and atrophy moderately advanced for his age.   MRA  normal  Carotid Doppler  B ICA 1-39% stenosis, VAs antegrade   2D Echo  EF 60-65%. No source of embolus   TEE to look for embolic source. Arranged with [place] for tomorrow.  If positive for PFO (patent foramen ovale), check bilateral lower extremity venous dopplers to rule out DVT as possible source of stroke. (I have made patient NPO after midnight tonight).  If TEE negative, a [place] electrophysiologist will consult and consider placement of an implantable loop recorder to evaluate for atrial fibrillation as etiology of stroke. Will explain to patient/family.  LDL 113  HgbA1c 5.4  HIV pending   SCDs for VTE prophylaxis  Check puncture sites for bleeding or hematomas.  Bleeding precautions  Fall precautions  Diet Heart Room service appropriate? Yes; Fluid consistency: Thin No antithrombotic prior to admission, now on No antithrombotic as within 24h of tPA administration. Plan aspirin at 24h if imaging negative for hemorrhage. (after 945p tonight). Likely given mild stroke, will place on aspirin 81 mg and plavix 75 mg daily x 3 weeks, then aspirin alone. Orders adjusted.   Therapy recommendations:  Pending. ok to be OOB  Disposition:  pending   Hypertension  Stable  On norvasc 10 and aldactone 25 PTA  SBP goal < 180  Hyperlipidemia   Home meds:  No statin  LDL 113, goal < 70  Add statin  Continue statin at discharge  Other Stroke Risk Factors  Former Cigarette smoker  ETOH use  Other Active Problems  GERD on prilosec  BPH on flomax  COPD on singulair, advair  Hospital day # 1  Burnetta Sabin, MSN, APRN, ANVP-BC, AGPCNP-BC Advanced Practice Stroke Nurse [place] for Schedule & Pager information 01/19/2018 10:56  AM  I have personally examined this patient, reviewed notes, independently viewed imaging studies, participated in medical decision making and plan of care.ROS completed by me personally and pertinent positives fully documented  I have made any additions or clarifications directly to the above note. Agree with note above. He presented with left hemispheric infarct of embolic etiology and received IV tPA enhancement significant clinical improvement. Recommend close blood pressure monitoring and neurological monitoring as per post TPA protocol. Continue ongoing stroke workup and check TEE and loop recorder tomorrow. Come discussion with the patient and wife at the bedside and answered questions.This patient is critically ill and at significant risk of neurological worsening, death and care requires constant monitoring of vital signs, hemodynamics,respiratory and cardiac monitoring, extensive review of multiple databases, frequent neurological assessment, discussion with family, other specialists and medical decision making of high complexity.I have made any additions or clarifications directly to the above note.This critical care time does not reflect procedure time, or teaching time or supervisory time of PA/NP/Med Resident etc but could involve care discussion time.  I spent 30 minutes of neurocritical care time  in the care of  this patient.      Antony Contras, MD Medical Director [place] Pager: [phone] 01/19/2018 4:38 PM  To contact Stroke Continuity provider, please refer to [email or web address] After hours, contact General Neurology

## 2018-01-19 NOTE — Progress Notes (Signed)
PT Cancellation Note  Patient Details Name: Semisi Biela MRN: [id number] DOB: 1951-01-18   Cancelled Treatment:    Reason Eval/Treat Not Completed: Active bedrest order   Patient not medically ready. Pt with strict bedrest orders s/p tPA administration 21:45 last evening (01/18/18). Will check back as appropriate.   Roney [name], [place]  Acute Rehabilitation Services Pager [phone] Office [phone]      [place] 01/19/2018, 8:18 AM

## 2018-01-19 NOTE — Progress Notes (Addendum)
STROKE TEAM PROGRESS NOTE   INTERVAL HISTORY His wife [name] is at the bedside.  He reports his RA is much improved. It was "useless" yesterday, but back to normal today.  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  CBC:  CBC Latest Ref Rng & Units 01/18/2018 01/18/2018 10/15/2017  WBC 4.0 - 10.5 K/uL - 12.6(H) 10.0  Hemoglobin 13.0 - 17.0 g/dL 17.3(H) 16.6 16.4  Hematocrit 39.0 - 52.0 % 51.0 49.0 49.8  Platelets 150 - 400 K/uL - 311 251.0   Comprehensive Metabolic Panel:   CMP Latest Ref Rng & Units 01/18/2018 01/18/2018 10/15/2017  Glucose 65 - 99 mg/dL 95 98 96  BUN 6 - 20 mg/dL 20 20 17   Creatinine 0.61 - 1.24 mg/dL 1.00 1.09 0.90  Sodium 135 - 145 mmol/L 140 139 139  Potassium 3.5 - 5.1 mmol/L 3.7 4.0 4.3  Chloride 101 - 111 mmol/L 101 102 101  CO2 22 - 32 mmol/L - 23 28  Calcium 8.9 - 10.3 mg/dL - 9.7 9.0  Total Protein 6.5 - 8.1 g/dL - 8.8(H) 7.3  Total Bilirubin 0.3 - 1.2 mg/dL - 0.9 0.8  Alkaline Phos 38 - 126 U/L - 63 65  AST 15 - 41 U/L - 22 11  ALT 17 - 63 U/L - 18 16   Lipid Panel:     Component Value Date/Time   CHOL 168 01/19/2018 1015   TRIG 79 01/19/2018 1015   HDL 39 (L) 01/19/2018 1015   CHOLHDL 4.3 01/19/2018 1015   VLDL 16 01/19/2018 1015   LDLCALC 113 (H) 01/19/2018 1015   HgbA1c:  Lab Results  Component Value Date   HGBA1C 5.4 01/19/2018   Urine Drug Screen:     Component Value Date/Time   LABOPIA NONE DETECTED 01/18/2018 2035   COCAINSCRNUR NONE DETECTED 01/18/2018 2035   LABBENZ NONE DETECTED 01/18/2018 2035   AMPHETMU NONE DETECTED 01/18/2018 2035   THCU NONE DETECTED 01/18/2018 2035   LABBARB NONE DETECTED 01/18/2018 2035    Alcohol Level     Component Value Date/Time   [facility] <10 01/18/2018 2035   Mr [name] Head Wo Contrast  Result Date: 01/19/2018 CLINICAL DATA:  Right upper extremity hemiparesis and numbness. Slurred speech. Status post tPA. EXAM: MRA HEAD WITHOUT CONTRAST TECHNIQUE: Angiographic images of the  Circle of Willis were obtained using MRA technique without intravenous contrast. COMPARISON:  CT head without contrast 01/18/18. MRI brain from the same day. FINDINGS: Time-of-flight MRA circle-of-Willis demonstrates mild signal loss the anterior genu of the cavernous internal carotid arteries bilaterally. This may be artifactual. Normal signal is otherwise present through the ICA termini bilaterally. A1 and M1 segments are normal. The MCA bifurcations are intact. ACA and MCA branch vessels are normal. The left vertebral artery is the dominant vessel. Left PICA origin is visualized and normal. Right AICA is dominant. The basilar artery is normal. A prominent right posterior cerebral artery is present. PCA branch vessels are within normal limits. IMPRESSION: Normal variant MRA circle of Willis without significant proximal stenosis, aneurysm, or branch vessel occlusion. Electronically Signed   By: San Morelle M.D.   On: 01/19/2018 13:57   Mr Brain Wo Contrast  Result Date: 01/19/2018 CLINICAL DATA:  Acute onset of left upper extremity weakness and numbness last evening. Status post tPA. EXAM: MRI HEAD WITHOUT CONTRAST TECHNIQUE: Multiplanar, multiecho pulse sequences of the brain and surrounding structures were obtained without intravenous contrast. COMPARISON:  CT head without contrast  01/18/2018 FINDINGS: Brain: Scattered foci of restricted diffusion are present in the posterior left frontal and left parietal lobe. This corresponds with right upper extremity symptoms. Minimal white matter infarcts subjacent to this area follow cortical spinal tracts. No acute hemorrhage or mass lesion is present. T2 cortical signal changes corresponding to the areas of restricted diffusion suggest the time frame of at least 12 hours. Other periventricular and subcortical T2 hyperintensities bilaterally are moderately advanced for age. Moderate atrophy is present. Ventricles are proportionate to the degree of atrophy. The  internal auditory canals are within normal limits bilaterally. Brainstem and cerebellum are normal. Vascular: Flow is present in the major intracranial arteries. Skull and upper cervical spine: The skull base is within normal limits. The craniocervical junction is normal. Degenerative changes are present at C1-2. Midline sagittal structures are otherwise unremarkable. Sinuses/Orbits: A fluid level is present in the right maxillary sinus. The right maxillary sinuses shrunken with circumferential mucosal thickening mild mucosal thickening is present in the anterior ethmoid air cells bilaterally. There is some mucosal thickening in the left frontal sinus. Small bilateral mastoid effusions are present. No obstructing nasopharyngeal lesion is present. Globes and orbits are within normal limits. IMPRESSION: 1. Scattered small acute/subacute nonhemorrhagic cortical infarcts in the posterior left frontal lobe and anterior left parietal lobe near the vertex. These correspond with the patient's abrupt onset of right upper extremity weakness and numbness. 2. Subjacent white matter restricted diffusion corresponds with the cortical spinal tracts. 3. Atrophy and white matter disease in addition to these areas is moderately advanced for age. This likely reflects the sequela of chronic microvascular ischemia. These results will be called to the ordering clinician or representative by the Radiologist Assistant, and communication documented in the PACS or zVision Dashboard. Electronically Signed   By: San Morelle M.D.   On: 01/19/2018 14:08   Ct Head Code Stroke Wo Contrast  Result Date: 01/18/2018 CLINICAL DATA:  Code stroke. Right-sided numbness since this evening. EXAM: CT HEAD WITHOUT CONTRAST TECHNIQUE: Contiguous axial images were obtained from the base of the skull through the vertex without intravenous contrast. COMPARISON:  None. FINDINGS: Brain: No evidence of acute infarction, hemorrhage, hydrocephalus,  extra-axial collection or mass lesion/mass effect. Mild to moderate low-density in the cerebral white matter attributed to chronic small vessel ischemia given medical history, confluent in the biparietal region. Vascular: Atherosclerotic calcification.  No hyperdense vessel. Skull: No acute or aggressive finding. Incomplete C1 ring with hypertrophic anterior arch. Clivus appears hypoplastic. Sinuses/Orbits: No acute finding Other: These results were called by telephone at the time of interpretation on 01/18/2018 at 8:53 pm to Dr. Margarita Mail , who verbally acknowledged these results. ASPECTS [facility] Early CT Score) - Ganglionic level infarction (caudate, lentiform nuclei, internal capsule, insula, M1-M3 cortex): 7 - Supraganglionic infarction (M4-M6 cortex): 3 Total score (0-10 with 10 being normal): 10 Mild motion degradation. IMPRESSION: 1. No acute finding.  ASPECTS is 10. 2. Moderate chronic small vessel ischemia in the cerebral white matter. Electronically Signed   By: Monte Fantasia M.D.   On: 01/18/2018 20:54   2D Echocardiogram  - Left ventricle: The cavity size was moderately dilated. Systolic function was normal. The estimated ejection fraction was in the range of 60% to 65%. Wall motion was normal; there were no regional wall motion abnormalities. Left ventricular diastolic function parameters were normal. - Aortic valve: There was mild regurgitation. - Mitral valve: Calcified annulus. - Left atrium: The atrium was mildly dilated. - Right ventricle: The cavity  size was moderately dilated. Wall thickness was normal. - Atrial septum: There was increased thickness of the septum, consistent with lipomatous hypertrophy.  Carotid Doppler   There is 1-39% bilateral ICA stenosis. Vertebral artery flow is antegrade.    PHYSICAL EXAM Vitals:   01/19/18 0400 01/19/18 0500 01/19/18 0600 01/19/18 0700  BP: 137/85 (!) 147/90 (!) 126/101 136/85  Pulse: 64 64 64 61  Resp: 17 12 14 13    Temp:      TempSrc:      SpO2: 93% 93% 94% 94%  Weight:      Height:       General: Appears well-developed and well-nourished.  Psych: Affect appropriate to situation Eyes: No scleral injection HENT: No OP obstrucion Head: Normocephalic.  Cardiovascular: Normal rate and regular rhythm.  Respiratory: Effort normal and breath sounds normal to anterior ascultation Skin: WDI  Neurological Examination Mental Status: Alert, oriented, thought content appropriate.  Speech fluent without evidence of aphasia. Able to follow 3 step commands without difficulty. Cranial Nerves: II: Visual fields grossly normal,  III,IV, VI: ptosis not present, extra-ocular motions intact bilaterally, pupils equal, round, reactive to light and accommodation V,VII: smile symmetric, facial light touch sensation normal bilaterally VIII: hearing normal bilaterally IX,X: uvula rises symmetrically XI: bilateral shoulder shrug XII: midline tongue extension Motor: Right :  Upper extremity   5/5                                      Left:     Upper extremity   5/5             Lower extremity   5/5                                                  Lower extremity   5/5 Decreased FMM on the right.orbits left over right upper extremity. Mild weakness of right [employer].it right hip flexor weakness. Tone and bulk:normal tone throughout; no atrophy noted Sensory: Pinprick and light touch intact throughout, bilaterally Plantars: Right: downgoing                                Left: downgoing Cerebellar: mild right upper extremity dysmetria  Gait: normal gait and station    ASSESSMENT/PLAN Mr. Tadao Emig is a 67 y.o. male with history of HTN, HLD, COPD, GERD and back pain presenting with R arm numbness and slurred speech. He received IV tPA 01/18/2018 at 0945p.  Stroke:  left brain embolic infarct secondary to unknown source. Workup underway  Resultant mild R HP   Code Stroke CT head No acute stroke. Small vessel  disease. ASPECTS 10.     MRI  Scattered small left frontal and parietal infarcts. Small vessel disease and atrophy moderately advanced for his age.   MRA  normal  Carotid Doppler  B ICA 1-39% stenosis, VAs antegrade   2D Echo  EF 60-65%. No source of embolus   TEE to look for embolic source. Arranged with [place] for tomorrow.  If positive for PFO (patent foramen ovale), check bilateral lower extremity venous dopplers to rule out DVT as possible source of stroke. (I have made patient NPO after midnight tonight).  If TEE negative, a [facility] electrophysiologist will consult and consider placement of an implantable loop recorder to evaluate for atrial fibrillation as etiology of stroke. Will explain to patient/family.  LDL 113  HgbA1c 5.4  HIV pending   SCDs for VTE prophylaxis  Check puncture sites for bleeding or hematomas.  Bleeding precautions  Fall precautions  Diet Heart Room service appropriate? Yes; Fluid consistency: Thin No antithrombotic prior to admission, now on No antithrombotic as within 24h of tPA administration. Plan aspirin at 24h if imaging negative for hemorrhage. (after 945p tonight). Likely given mild stroke, will place on aspirin 81 mg and plavix 75 mg daily x 3 weeks, then aspirin alone. Orders adjusted.   Therapy recommendations:  Pending. ok to be OOB  Disposition:  pending   Hypertension  Stable  On norvasc 10 and aldactone 25 PTA  SBP goal < 180  Hyperlipidemia   Home meds:  No statin  LDL 113, goal < 70  Add statin  Continue statin at discharge  Other Stroke Risk Factors  Former Cigarette smoker  ETOH use  Other Active Problems  GERD on prilosec  BPH on flomax  COPD on singulair, advair  Hospital day # 1  Burnetta Sabin, MSN, APRN, ANVP-BC, AGPCNP-BC Advanced Practice Stroke Nurse [place] for Schedule & Pager information 01/19/2018 10:56  AM  I have personally examined this patient, reviewed notes, independently viewed imaging studies, participated in medical decision making and plan of care.ROS completed by me personally and pertinent positives fully documented  I have made any additions or clarifications directly to the above note. Agree with note above. He presented with left hemispheric infarct of embolic etiology and received IV tPA enhancement significant clinical improvement. Recommend close blood pressure monitoring and neurological monitoring as per post TPA protocol. Continue ongoing stroke workup and check TEE and loop recorder tomorrow. Come discussion with the patient and wife at the bedside and answered questions.This patient is critically ill and at significant risk of neurological worsening, death and care requires constant monitoring of vital signs, hemodynamics,respiratory and cardiac monitoring, extensive review of multiple databases, frequent neurological assessment, discussion with family, other specialists and medical decision making of high complexity.I have made any additions or clarifications directly to the above note.This critical care time does not reflect procedure time, or teaching time or supervisory time of PA/NP/Med Resident etc but could involve care discussion time.  I spent 30 minutes of neurocritical care time  in the care of  this patient.      Antony Contras, MD Medical Director [place] Pager: [phone] 01/19/2018 4:38 PM  To contact Stroke Continuity provider, please refer to [email or web address] After hours, contact General Neurology

## 2018-01-20 DIAGNOSIS — J449 Chronic obstructive pulmonary disease, unspecified: Secondary | ICD-10-CM

## 2018-01-20 DIAGNOSIS — E785 Hyperlipidemia, unspecified: Secondary | ICD-10-CM | POA: Diagnosis present

## 2018-01-20 DIAGNOSIS — N400 Enlarged prostate without lower urinary tract symptoms: Secondary | ICD-10-CM | POA: Diagnosis present

## 2018-01-20 DIAGNOSIS — I63412 Cerebral infarction due to embolism of left middle cerebral artery: Principal | ICD-10-CM

## 2018-01-20 LAB — HIV ANTIBODY (ROUTINE TESTING W REFLEX): HIV Screen 4th Generation wRfx: NONREACTIVE

## 2018-01-20 MED ORDER — CLOPIDOGREL BISULFATE 75 MG PO TABS: 75.0000 mg | ORAL_TABLET | Freq: Every day | ORAL | 0 refills | Status: DC

## 2018-01-20 MED ORDER — ATORVASTATIN CALCIUM 40 MG PO TABS
40.0000 mg | ORAL_TABLET | Freq: Every day | ORAL | 2 refills | Status: DC
Start: 2018-01-20 — End: 2018-02-23

## 2018-01-20 MED ORDER — CLOPIDOGREL BISULFATE 75 MG PO TABS
75.0000 mg | ORAL_TABLET | Freq: Every day | ORAL | Status: DC
  Filled 2018-01-20: qty 1

## 2018-01-20 MED ORDER — ASPIRIN 81 MG PO TBEC: 81.0000 mg | DELAYED_RELEASE_TABLET | Freq: Every day | ORAL | 99 refills | Status: DC

## 2018-01-20 NOTE — Evaluation (Signed)
Speech Language Pathology Evaluation Patient Details Name: Charles Mccoy MRN: [id number] DOB: 1951/04/19 Today's Date: 01/20/2018 Time: 4098-1191 SLP Time Calculation (min) (ACUTE ONLY): 25 min  Problem List:  Patient Active Problem List   Diagnosis Date Noted  . Stroke (cerebrum) ([place]) 01/19/2018  . Suspected stroke patient last known to be well 2 to 3 hours ago 01/18/2018  . Primary osteoarthritis of right knee 05/30/2016  . Right knee DJD 05/30/2016  . GERD (gastroesophageal reflux disease) 07/28/2014  . Left knee DJD 07/21/2014  . Osteoarthritis 10/09/2009  . Asthma 08/17/2008  . SINUSITIS 09/15/2007  . Essential hypertension 06/19/2007  . Allergic rhinitis 06/19/2007   Past Medical History:  Past Medical History:  Diagnosis Date  . ALLERGIC RHINITIS 06/19/2007  . ASTHMA 08/17/2008  . Back pain 05/2016  . COPD (chronic obstructive pulmonary disease) ([place])   . GERD (gastroesophageal reflux disease)   . HYPERTENSION 06/19/2007  . Hypertension    meds controlling well  . OSTEOARTHRITIS 10/09/2009   wrists, knee.  . Pneumonia    hx of   . SINUSITIS 09/15/2007   recent issuses 2 weeks ago-clearing.   Past Surgical History:  Past Surgical History:  Procedure Laterality Date  . cyst removed from neck      age 54   . KNEE ARTHROSCOPY     2 on left knee , 1 on right  . TOTAL KNEE ARTHROPLASTY Left 07/21/2014   Procedure: LEFT TOTAL KNEE ARTHROPLASTY;  Surgeon: Johnn Hai, MD;  Location: [facility];  Service: Orthopedics;  Laterality: Left;  . TOTAL KNEE ARTHROPLASTY Right 05/30/2016   Procedure: RIGHT TOTAL KNEE ARTHROPLASTY REPLACEMENT;  Surgeon: Susa Day, MD;  Location: [facility];  Service: Orthopedics;  Laterality: Right;   HPI:  [name] is a 67 y.o. male with history of HTN, HLD, COPD, GERD and back pain. Pt presented with R arm numbness and slurred speech. He received IV tPA 01/18/2018. MRI (4/16) revealed scattered small acute/subacute nonhemorrhagic cortical  infarcts in the posterior left frontal lobe and anterior left parietal lobe near the vertex. Not seen previously by SLP. Orders recieved for SLE.    Assessment / Plan / Recommendation Clinical Impression    Pt presents with mild cognitive deficits in regards to alternating attention, mathematical calculation, storage of new material, and verbal expression. Pt benefited from [place] to Mod verbal cues for self-correction during tasks and Mod repetition to complete memory tasks. Pt's Min alternating attention deficit most notable when pt needed Mod verbal cues to alternate between discussion with RN and verbal cognitive task. Verbal expression deficits were identified in structured tasks and in pt's minimally halting speech pattern during conversation. Given pt's high level of function PTA, SLP will follow up for further assessment of pt's higher level cognitive ability. Recommend OP SLP follow up upon D/C.     SLP Assessment  SLP Recommendation/Assessment: Patient needs continued Speech Lanaguage Pathology Services SLP Visit Diagnosis: Cognitive communication deficit (R41.841)    Follow Up Recommendations  Outpatient SLP    Frequency and Duration min 2x/week  2 weeks      SLP Evaluation Cognition  Overall Cognitive Status: Impaired/Different from baseline Arousal/Alertness: Awake/alert Orientation Level: Oriented X4 Attention: Alternating Alternating Attention: Impaired Alternating Attention Impairment: Verbal basic Memory: Impaired Memory Impairment: Storage deficit Awareness: Appears intact Problem Solving: Appears intact Executive Function: Self Monitoring;Self Correcting;Reasoning Reasoning: Appears intact Self Monitoring: Impaired Self Monitoring Impairment: Verbal basic Self Correcting: Impaired Self Correcting Impairment: Verbal basic Safety/Judgment: Appears intact  Comprehension  Auditory Comprehension Overall Auditory Comprehension: Appears within functional limits  for tasks assessed Visual Recognition/Discrimination Discrimination: Not tested Reading Comprehension Reading Status: Not tested    Expression Expression Primary Mode of Expression: Verbal Verbal Expression Overall Verbal Expression: Impaired Initiation: Impaired Automatic Speech: Name;Social Response Level of Generative/Spontaneous Verbalization: Conversation Repetition: Impaired Level of Impairment: Sentence level;Word level Naming: Not tested Pragmatics: No impairment Effective Techniques: Semantic cues;Open ended questions Written Expression Written Expression: Not tested   Oral / Motor  Motor Speech Overall Motor Speech: Appears within functional limits for tasks assessed   GO                   [place] Rosselyn Martha SLP Student Clinician  [place] Rahim Astorga 01/20/2018, 10:20 AM

## 2018-01-20 NOTE — Discharge Summary (Addendum)
Stroke Discharge Summary  Patient ID: Charles Mccoy   MRN: [id number]      DOB: 02/23/1951  Date of Admission: 01/18/2018 Date of Discharge: 01/20/2018  Attending Physician:  Charles Fila, MD, Stroke MD Consultant(s):     Teleneurology at [facility] Patient's PCP:  Charles Lor, MD  DISCHARGE DIAGNOSIS:  Principal Problem:   Cerebral infarction due to embolism of left middle cerebral artery [facility]) s/p IV  tPA unknown source Active Problems:   Essential hypertension   GERD (gastroesophageal reflux disease)   Hyperlipidemia   BPH (benign prostatic hyperplasia)   COPD (chronic obstructive pulmonary disease) ([place])   Past Medical History:  Diagnosis Date  . ALLERGIC RHINITIS 06/19/2007  . ASTHMA 08/17/2008  . Back pain 05/2016  . COPD (chronic obstructive pulmonary disease) ([place])   . GERD (gastroesophageal reflux disease)   . HYPERTENSION 06/19/2007  . Hypertension    meds controlling well  . OSTEOARTHRITIS 10/09/2009   wrists, knee.  . Pneumonia    hx of   . SINUSITIS 09/15/2007   recent issuses 2 weeks ago-clearing.   Past Surgical History:  Procedure Laterality Date  . cyst removed from neck      age 41   . KNEE ARTHROSCOPY     2 on left knee , 1 on right  . TOTAL KNEE ARTHROPLASTY Left 07/21/2014   Procedure: LEFT TOTAL KNEE ARTHROPLASTY;  Surgeon: Charles Hai, MD;  Location: [facility];  Service: Orthopedics;  Laterality: Left;  . TOTAL KNEE ARTHROPLASTY Right 05/30/2016   Procedure: RIGHT TOTAL KNEE ARTHROPLASTY REPLACEMENT;  Surgeon: Charles Day, MD;  Location: [facility];  Service: Orthopedics;  Laterality: Right;    Allergies as of 01/20/2018      Reactions   Erythromycin Base Other (See Comments)   REACTION: stomach irritation   Penicillins Other (See Comments)   unknown      Medication List    TAKE these medications   amLODipine 10 MG tablet Commonly known as:  NORVASC Take 1 tablet (10 mg total) by mouth every morning.   aspirin 81 MG EC  tablet Take 1 tablet (81 mg total) by mouth daily. Start taking on:  01/21/2018   atorvastatin 40 MG tablet Commonly known as:  LIPITOR Take 1 tablet (40 mg total) by mouth daily at 6 PM.   azelastine 0.1 % nasal spray Commonly known as:  ASTELIN Place 1 spray into both nostrils at bedtime.   clopidogrel 75 MG tablet Commonly known as:  PLAVIX Take 1 tablet (75 mg total) by mouth daily.   Fluticasone-Salmeterol 232-14 MCG/ACT Aepb Inhale 1 puff into the lungs 2 (two) times daily.   montelukast 10 MG tablet Commonly known as:  SINGULAIR TAKE 1 TABLET BY MOUTH AT BEDTIME   omeprazole 20 MG capsule Commonly known as:  PRILOSEC TAKE 1 CAPSULE (20 MG TOTAL) BY MOUTH EVERY MORNING.   spironolactone 25 MG tablet Commonly known as:  ALDACTONE Take 1 tablet (25 mg total) by mouth daily.   tamsulosin 0.4 MG Caps capsule Commonly known as:  FLOMAX Take 1 capsule (0.4 mg total) by mouth daily.       LABORATORY STUDIES CBC    Component Value Date/Time   WBC 12.6 (H) 01/18/2018 2035   RBC 5.76 01/18/2018 2035   HGB 17.3 (H) 01/18/2018 2047   HCT 51.0 01/18/2018 2047   PLT 311 01/18/2018 2035   MCV 85.1 01/18/2018 2035   MCH 28.8 01/18/2018 2035   MCHC  33.9 01/18/2018 2035   RDW 13.6 01/18/2018 2035   LYMPHSABS 3.3 01/18/2018 2035   MONOABS 1.4 (H) 01/18/2018 2035   EOSABS 0.2 01/18/2018 2035   BASOSABS 0.0 01/18/2018 2035   CMP    Component Value Date/Time   NA 140 01/18/2018 2047   NA 134 (A) 06/03/2016   K 3.7 01/18/2018 2047   CL 101 01/18/2018 2047   CO2 23 01/18/2018 2035   GLUCOSE 95 01/18/2018 2047   BUN 20 01/18/2018 2047   BUN 15 06/03/2016   CREATININE 1.00 01/18/2018 2047   CALCIUM 9.7 01/18/2018 2035   PROT 8.8 (H) 01/18/2018 2035   ALBUMIN 4.6 01/18/2018 2035   AST 22 01/18/2018 2035   ALT 18 01/18/2018 2035   ALKPHOS 63 01/18/2018 2035   BILITOT 0.9 01/18/2018 2035   GFRNONAA >60 01/18/2018 2035   GFRAA >60 01/18/2018 2035   COAGS Lab  Results  Component Value Date   INR 0.95 01/18/2018   INR 1.09 05/21/2016   INR 1.08 07/12/2014   Lipid Panel    Component Value Date/Time   CHOL 168 01/19/2018 1015   TRIG 79 01/19/2018 1015   HDL 39 (L) 01/19/2018 1015   CHOLHDL 4.3 01/19/2018 1015   VLDL 16 01/19/2018 1015   LDLCALC 113 (H) 01/19/2018 1015   HgbA1C  Lab Results  Component Value Date   HGBA1C 5.4 01/19/2018   Urinalysis    Component Value Date/Time   COLORURINE YELLOW 01/18/2018 2035   APPEARANCEUR CLEAR 01/18/2018 2035   LABSPEC 1.024 01/18/2018 2035   PHURINE 5.0 01/18/2018 2035   GLUCOSEU NEGATIVE 01/18/2018 2035   HGBUR NEGATIVE 01/18/2018 2035   HGBUR negative 09/05/2009 1053   BILIRUBINUR NEGATIVE 01/18/2018 2035   BILIRUBINUR neg 09/06/2016 1048   KETONESUR 5 (A) 01/18/2018 2035   PROTEINUR NEGATIVE 01/18/2018 2035   UROBILINOGEN 1.0 09/06/2016 1048   UROBILINOGEN 0.2 07/12/2014 1200   NITRITE NEGATIVE 01/18/2018 2035   LEUKOCYTESUR NEGATIVE 01/18/2018 2035   Urine Drug Screen     Component Value Date/Time   LABOPIA NONE DETECTED 01/18/2018 2035   COCAINSCRNUR NONE DETECTED 01/18/2018 2035   LABBENZ NONE DETECTED 01/18/2018 2035   AMPHETMU NONE DETECTED 01/18/2018 2035   THCU NONE DETECTED 01/18/2018 2035   LABBARB NONE DETECTED 01/18/2018 2035    Alcohol Level    Component Value Date/Time   [place] <10 01/18/2018 2035     SIGNIFICANT DIAGNOSTIC STUDIES Mr [name] Head Wo Contrast  Result Date: 01/19/2018 CLINICAL DATA:  Right upper extremity hemiparesis and numbness. Slurred speech. Status post tPA. EXAM: MRA HEAD WITHOUT CONTRAST TECHNIQUE: Angiographic images of the Circle of Willis were obtained using MRA technique without intravenous contrast. COMPARISON:  CT head without contrast 01/18/18. MRI brain from the same [facility]. FINDINGS: Time-of-flight MRA circle-of-Willis demonstrates mild signal loss the anterior genu of the cavernous internal carotid arteries bilaterally. This may be  artifactual. Normal signal is otherwise present through the ICA termini bilaterally. A1 and M1 segments are normal. The MCA bifurcations are intact. ACA and MCA branch vessels are normal. The left vertebral artery is the dominant vessel. Left PICA origin is visualized and normal. Right AICA is dominant. The basilar artery is normal. A prominent right posterior cerebral artery is present. PCA branch vessels are within normal limits. IMPRESSION: Normal variant MRA circle of Willis without significant proximal stenosis, aneurysm, or branch vessel occlusion. Electronically Signed   By: San Morelle M.D.   On: 01/19/2018 13:57   Mr Brain Wo Contrast  Result Date: 01/19/2018 CLINICAL DATA:  Acute onset of left upper extremity weakness and numbness last evening. Status post tPA. EXAM: MRI HEAD WITHOUT CONTRAST TECHNIQUE: Multiplanar, multiecho pulse sequences of the brain and surrounding structures were obtained without intravenous contrast. COMPARISON:  CT head without contrast 01/18/2018 FINDINGS: Brain: Scattered foci of restricted diffusion are present in the posterior left frontal and left parietal lobe. This corresponds with right upper extremity symptoms. Minimal white matter infarcts subjacent to this area follow cortical spinal tracts. No acute hemorrhage or mass lesion is present. T2 cortical signal changes corresponding to the areas of restricted diffusion suggest the time frame of at least 12 hours. Other periventricular and subcortical T2 hyperintensities bilaterally are moderately advanced for age. Moderate atrophy is present. Ventricles are proportionate to the degree of atrophy. The internal auditory canals are within normal limits bilaterally. Brainstem and cerebellum are normal. Vascular: Flow is present in the major intracranial arteries. Skull and upper cervical spine: The skull base is within normal limits. The craniocervical junction is normal. Degenerative changes are present at C1-2.  Midline sagittal structures are otherwise unremarkable. Sinuses/Orbits: A fluid level is present in the right maxillary sinus. The right maxillary sinuses shrunken with circumferential mucosal thickening mild mucosal thickening is present in the anterior ethmoid air cells bilaterally. There is some mucosal thickening in the left frontal sinus. Small bilateral mastoid effusions are present. No obstructing nasopharyngeal lesion is present. Globes and orbits are within normal limits. IMPRESSION: 1. Scattered small acute/subacute nonhemorrhagic cortical infarcts in the posterior left frontal lobe and anterior left parietal lobe near the vertex. These correspond with the patient's abrupt onset of right upper extremity weakness and numbness. 2. Subjacent white matter restricted diffusion corresponds with the cortical spinal tracts. 3. Atrophy and white matter disease in addition to these areas is moderately advanced for age. This likely reflects the sequela of chronic microvascular ischemia. These results will be called to the ordering clinician or representative by the Radiologist Assistant, and communication documented in the PACS or zVision Dashboard. Electronically Signed   By: San Morelle M.D.   On: 01/19/2018 14:08   Ct Head Code Stroke Wo Contrast  Result Date: 01/18/2018 CLINICAL DATA:  Code stroke. Right-sided numbness since this evening. EXAM: CT HEAD WITHOUT CONTRAST TECHNIQUE: Contiguous axial images were obtained from the base of the skull through the vertex without intravenous contrast. COMPARISON:  None. FINDINGS: Brain: No evidence of acute infarction, hemorrhage, hydrocephalus, extra-axial collection or mass lesion/mass effect. Mild to moderate low-density in the cerebral white matter attributed to chronic small vessel ischemia given medical history, confluent in the biparietal region. Vascular: Atherosclerotic calcification.  No hyperdense vessel. Skull: No acute or aggressive finding.  Incomplete C1 ring with hypertrophic anterior arch. Clivus appears hypoplastic. Sinuses/Orbits: No acute finding Other: These results were called by telephone at the time of interpretation on 01/18/2018 at 8:53 pm to Dr. Margarita Mail , who verbally acknowledged these results. ASPECTS [facility] Stroke Program Early CT Score) - Ganglionic level infarction (caudate, lentiform nuclei, internal capsule, insula, M1-M3 cortex): 7 - Supraganglionic infarction (M4-M6 cortex): 3 Total score (0-10 with 10 being normal): 10 Mild motion degradation. IMPRESSION: 1. No acute finding.  ASPECTS is 10. 2. Moderate chronic small vessel ischemia in the cerebral white matter. Electronically Signed   By: Monte Fantasia M.D.   On: 01/18/2018 20:54    2D Echocardiogram  - Left ventricle: The cavity size was moderately dilated. Systolicfunction was normal. The estimated ejection fraction was in therange of  60% to 65%. Wall motion was normal; there were noregional wall motion abnormalities. Left ventricular diastolicfunction parameters were normal. - Aortic valve: There was mild regurgitation. - Mitral valve: Calcified annulus. - Left atrium: The atrium was mildly dilated. - Right ventricle: The cavity size was moderately dilated. Wallthickness was normal. - Atrial septum: There was increased thickness of the septum,consistent with lipomatous hypertrophy.  Carotid Doppler   There is 1-39% bilateral ICA stenosis. Vertebral artery flow is antegrade.       HISTORY OF PRESENT ILLNESS Charles Mccoy is an 67 y.o. male with PMH of HTN, HLD presents to [facility] ER as a stroke alert. He was watching TV while around 8.15 pm (LKW 01/18/2018 at 2015) noticed his right arm felt numb and when he got up noticed it was not moving. He also had slurred speech. He went to the hospital [facility] ER) and was stroke alerted.  Tele stroke neurology was consulted and he received IV TPA .  He is compliant with BP meds. Does not take aspirin. Has  borderline high LDL, not on statin. Baseline MRS 0  He was transferred to [facility] for post tPA care and further evaluation and treatment.    HOSPITAL COURSE [name] is a 67 y.o. male with history of HTN, HLD, COPD, GERD and back pain presenting with R arm numbness and slurred speech. He received IV tPA 01/18/2018 at 0945p.  Stroke:  left brain embolic infarct secondary to unknown source.   Resultant mild R HP   Code Stroke CT head No acute stroke. Small vessel disease. ASPECTS 10.     MRI  Scattered small left frontal and parietal infarcts. Small vessel disease and atrophy moderately advanced for his age.   MRA  normal  Carotid Doppler  B ICA 1-39% stenosis, VAs antegrade   2D Echo  EF 60-65%. No source of embolus   TEE requested but unable to do for 2 days due to full schedule. Discussed with patient and cardiology. Will do TEE and Loop (if needed) as an outpatient. Cardiology to call pt to schedule  If TEE shows PFO, please do LE doppler to look for DVT as source of stroke.  LDL 113  HgbA1c 5.4  HIV pending   No antithrombotic prior to admission, now on aspirin 81 mg daily. given mild stroke, will place on aspirin 81 mg and plavix 75 mg daily x 3 weeks, then aspirin alone.   Therapy recommendations:  OP PT and OT  Disposition:  d/c home with wife  Hypertension  Stable  On norvasc 10 and aldactone 25 PTA  Resume home BP meds at discharge  Long term BP goal normotensive  Hyperlipidemia   Home meds:  No statin  LDL 113, goal < 70  Added lipitor 40  Continue statin at discharge  Other Stroke Risk Factors  Former Cigarette smoker  ETOH use  Other Active Problems  GERD on prilosec  BPH on flomax  COPD on singulair, advair   DISCHARGE EXAM Blood pressure (!) 166/98, pulse 65, temperature 98.1 F (36.7 C), temperature source Oral, resp. rate 14, height 6\' 4"  (1.93 m), weight 111.7 kg (246 lb 4.1 oz), SpO2 94 %. General:  Appears well-developed and well-nourished.  Psych: Affect appropriate to situation Eyes: No scleral injection HENT: No OP obstrucion Head: Normocephalic.  Cardiovascular: Normal rate and regular rhythm.  Respiratory: Effort normal and breath sounds normal to anterior ascultation Skin: WDI  Neurological Examination Mental Status: Alert, oriented, thought content  appropriate. Speech fluent without evidence of aphasia. Able to follow 3 step commands without difficulty. Limited 1 min item recall. Cranial Nerves: II: Visual fields grossly normal,  III,IV, VI: ptosis not present, extra-ocular motions intact bilaterally, pupils equal, round, reactive to light and accommodation V,VII: smile symmetric, facial light touch sensation normal bilaterally VIII: hearing normal bilaterally IX,X: uvula rises symmetrically XI: bilateral shoulder shrug XII: midline tongue extension Motor: Right :Upper extremity5/5Left: Upper extremity 5/5 Lower extremity 5/5Lower extremity 5/5 Decreased FMM on the right.orbits left over right upper extremity. Mild weakness of right grip. mild right hip flexor weakness. Tone and bulk:normal tone throughout; no atrophy noted Sensory: Pinprick and light touch intact throughout, bilaterally Plantars: Right: downgoingLeft: downgoing Cerebellar:mild right upper extremity dysmetria Gait: normal gait and station  Discharge Diet   Heart Healthy Thin liquids  DISCHARGE PLAN  Disposition:  D/c home with wife  Outpatient TEE and loop placement if indicated. Check LE dopplers if TEE positive for PFO  Outpatient PT and OT  Plan return to work in 1 month   aspirin 81 mg daily and clopidogrel 75 mg daily for secondary stroke prevention x 3 weeks then aspirin alone   Ongoing risk factor control by Primary Care Physician at time  of discharge  Follow-up Charles Lor, MD in 2 weeks.  Follow-up in [facility] in 4 weeks, office to schedule an appointment.   40 minutes were spent preparing discharge.  Burnetta Sabin, MSN, APRN, ANVP-BC, AGPCNP-BC Advanced Practice Stroke Nurse [place] for Schedule & Pager information 01/20/2018 11:26 AM  I have personally examined this patient, reviewed notes, independently viewed imaging studies, participated in medical decision making and plan of care.ROS completed by me personally and pertinent positives fully documented  I have made any additions or clarifications directly to the above note. Agree with note above.    Antony Contras, MD Medical Director [facility] Pager: [phone] 01/20/2018 5:43 PM

## 2018-01-20 NOTE — Care Management Note (Addendum)
Case Management Note  Patient Details  Name: Charles Mccoy MRN: [id number] Date of Birth: Aug 29, 1951  Subjective/Objective:     Pt admitted with Stroke             Action/Plan:  PTA independent from home with wife. PT has PCP and denied barriers to obtaining/paying for medications.  Outpt PT/ neuro OT and Speech recommended.  Pt is in agreement to go to outpt rehab at the [facility] location.   CM confirmed with Neuro Rehab that all services are provided at location and that Epic referral comments should reflect PT, OT and Speech therapy.  CM submitted referral via Epic   Expected Discharge Date:                  Expected Discharge Plan:     In-House Referral:     Discharge planning Services  CM Consult  Post Acute Care Choice:    Choice offered to:     DME Arranged:    DME Agency:     HH Arranged:    HH Agency:     Status of Service:     If discussed at [employer], dates discussed:    Additional Comments:  Maryclare Labrador, RN 01/20/2018, 10:50 AM

## 2018-01-20 NOTE — Progress Notes (Signed)
discharged home with wife. Discharge instructions given and understood.

## 2018-01-20 NOTE — Progress Notes (Signed)
Physical Therapy Treatment and D/C  Patient Details Name: Charles Mccoy MRN: [id number] DOB: 05-15-1951 Today's Date: 01/20/2018    History of Present Illness Pt is 67 y.o. male with PMH of HTN, who presents with sudden onset right hemiparesis. He recevied IVtPA and transported to [facility] ICU 01/18/18 for further workup. MRI 4/15 shows scattered small acute/subacute nonhemorrhagic cortical infarcts in posterior L frontal lobe and anterior L parietal lobe.    PT Comments    Pt admitted with above diagnosis. Pt currently without significant functional limitations and going home today. Pt will benefit from skilled PT at Outpt to address any high level balance community issues.  Safe to go home with wife and has met inpatient goals.    Follow Up Recommendations  Outpatient PT;Other (comment)(for higher level balance)     Equipment Recommendations  None recommended by PT    Recommendations for Other Services       Precautions / Restrictions Precautions Precautions: None Restrictions Weight Bearing Restrictions: No    Mobility  Bed Mobility Overal bed mobility: Needs Assistance Bed Mobility: Supine to Sit     Supine to sit: Independent Sit to supine: Independent      Transfers Overall transfer level: Needs assistance Equipment used: None Transfers: Sit to/from Stand Sit to Stand: Independent            Ambulation/Gait Ambulation/Gait assistance: Independent   Assistive device: None Gait Pattern/deviations: Step-through pattern   Gait velocity interpretation: >4.37 ft/sec, indicative of normal walking speed General Gait Details: Overall walking well, with no gross losses of balance   Stairs Stairs: Yes Stairs assistance: Modified independent (Device/Increase time) Stair Management: One rail Left;Alternating pattern;Forwards Number of Stairs: 12 General stair comments: No problems with stairs   Wheelchair Mobility    Modified Rankin (Stroke Patients  Only) Modified Rankin (Stroke Patients Only) Pre-Morbid Rankin Score: No symptoms Modified Rankin: No significant disability     Balance Overall balance assessment: Independent                               Standardized Balance Assessment Standardized Balance Assessment : Dynamic Gait Index   Dynamic Gait Index Level Surface: Normal Change in Gait Speed: Normal Gait with Horizontal Head Turns: Normal Gait with Vertical Head Turns: Normal Gait and Pivot Turn: Normal Step Over Obstacle: Normal Step Around Obstacles: Normal Steps: Mild Impairment Total Score: 23      Cognition Arousal/Alertness: Awake/alert Behavior During Therapy: WFL for tasks assessed/performed Overall Cognitive Status: Within Functional Limits for tasks assessed                                        Exercises Other Exercises Other Exercises: Educated pt and wife regarding STroke risk factors, lifestyle changes and information about CVA.  Gave two handouts.  Pt and wife appreciative.     General Comments General comments (skin integrity, edema, etc.): 23/24 on DGI suggesting low risk of fall.       Pertinent Vitals/Pain Pain Assessment: No/denies pain Faces Pain Scale: No hurt  VSS  Home Living     Available Help at Discharge: Family;Available 24 hours/day Type of Home: House              Prior Function            PT Goals (current goals can now be  found in the care plan section) Progress towards PT goals: Goals met/education completed, patient discharged from PT    Frequency    Min 4X/week      PT Plan Current plan remains appropriate    Co-evaluation              AM-PAC PT "6 Clicks" Daily Activity  Outcome Measure  Difficulty turning over in bed (including adjusting bedclothes, sheets and blankets)?: None Difficulty moving from lying on back to sitting on the side of the bed? : None Difficulty sitting down on and standing up from a  chair with arms (e.g., wheelchair, bedside commode, etc,.)?: None Help needed moving to and from a bed to chair (including a wheelchair)?: None Help needed walking in hospital room?: None Help needed climbing 3-5 steps with a railing? : None 6 Click Score: 24    End of Session Equipment Utilized During Treatment: Gait belt Activity Tolerance: Patient tolerated treatment well Patient left: in chair;with call bell/phone within reach;with family/visitor present Nurse Communication: Mobility status PT Visit Diagnosis: Hemiplegia and hemiparesis Hemiplegia - Right/Left: Right Hemiplegia - dominant/non-dominant: Non-dominant Hemiplegia - caused by: Cerebral infarction     Time: 1110-1133 PT Time Calculation (min) (ACUTE ONLY): 23 min  Charges:  $Gait Training: 8-22 mins $Self Care/Home Management: 8-22                    G Codes:       [name],PT Acute Rehabilitation [phone] (pager)    Denice Paradise 01/20/2018, 11:52 AM

## 2018-01-21 ENCOUNTER — Telehealth: Payer: Self-pay | Admitting: Family Medicine

## 2018-01-21 ENCOUNTER — Telehealth: Payer: Self-pay | Admitting: *Deleted

## 2018-01-21 NOTE — Telephone Encounter (Signed)
Transition Care Management Follow-up Telephone Call  Stroke Discharge Summary  Patient ID:      Charles Mccoy                         MRN: [id number]                                                         DOB: 1950-10-28  Date of Admission: 01/18/2018 Date of Discharge: 01/20/2018  Attending Physician:  Garvin Fila, MD, Stroke MD Consultant(s):     Teleneurology at [facility] Patient's PCP:  Marletta Lor, MD  DISCHARGE DIAGNOSIS:  Principal Problem:   Cerebral infarction due to embolism of left middle cerebral artery [facility]) s/p IV  tPA unknown source Active Problems:   Essential hypertension   GERD (gastroesophageal reflux disease)   Hyperlipidemia   BPH (benign prostatic hyperplasia)   COPD (chronic obstructive pulmonary disease) ([place])    How have you been since you were released from the hospital? :better"   Do you understand why you were in the hospital? yes   Do you understand the discharge instructions? yes   Where were you discharged to? Home   Items Reviewed:  Medications reviewed: yes  Allergies reviewed: yes  Dietary changes reviewed: yes  Referrals reviewed: yes   Functional Questionnaire:   Activities of Daily Living (ADLs):   He states they are independent in the following: ambulation, bathing and hygiene, feeding, continence, grooming, toileting and dressing States they require assistance with the following: none   Any transportation issues/concerns?: no   Any patient concerns? no   Confirmed importance and date/time of follow-up visits scheduled yes  We need to get pt on the schedule, have to check to see where we can schedule this? We will contact pt to schedule.   Confirmed with patient if condition begins to worsen call PCP or go to the ER.  Patient was given the office number and encouraged to call back with question or concerns.  : yes

## 2018-01-21 NOTE — Telephone Encounter (Signed)
I tried to reach pt and no answer, will try again later.

## 2018-01-21 NOTE — Care Management (Signed)
CM also manually faxed referral for PT, OT and Speech to [facility] 01/21/18 - CM confirmed receipt with center

## 2018-01-21 NOTE — Telephone Encounter (Signed)
Informed patient that Chanetta Marshall, NP would contact him next week to arrange TEE w/ possible LINQ. Pt would like to do ASAP Will forward to Amber for her FYI.

## 2018-01-22 ENCOUNTER — Ambulatory Visit: Payer: 59 | Admitting: Speech Pathology

## 2018-01-22 ENCOUNTER — Ambulatory Visit: Payer: 59 | Attending: Neurology | Admitting: Physical Therapy

## 2018-01-22 ENCOUNTER — Encounter: Payer: Self-pay | Admitting: Physical Therapy

## 2018-01-22 ENCOUNTER — Encounter: Payer: Self-pay | Admitting: Speech Pathology

## 2018-01-22 ENCOUNTER — Ambulatory Visit: Payer: 59 | Admitting: Occupational Therapy

## 2018-01-22 ENCOUNTER — Other Ambulatory Visit: Payer: Self-pay

## 2018-01-22 VITALS — BP 161/88 | HR 71

## 2018-01-22 DIAGNOSIS — R208 Other disturbances of skin sensation: Secondary | ICD-10-CM | POA: Insufficient documentation

## 2018-01-22 DIAGNOSIS — R41841 Cognitive communication deficit: Secondary | ICD-10-CM

## 2018-01-22 DIAGNOSIS — R278 Other lack of coordination: Secondary | ICD-10-CM

## 2018-01-22 DIAGNOSIS — R262 Difficulty in walking, not elsewhere classified: Secondary | ICD-10-CM | POA: Insufficient documentation

## 2018-01-22 DIAGNOSIS — I69353 Hemiplegia and hemiparesis following cerebral infarction affecting right non-dominant side: Secondary | ICD-10-CM | POA: Insufficient documentation

## 2018-01-22 DIAGNOSIS — M6281 Muscle weakness (generalized): Secondary | ICD-10-CM | POA: Insufficient documentation

## 2018-01-22 NOTE — Therapy (Signed)
[facility] [address] [place], [place], [address] Phone: [phone]   Fax:  [phone]  Occupational Therapy Evaluation  Patient Details  Name: Charles Mccoy MRN: [id number] Date of Birth: 1950/12/12 Referring Provider: Antony Contras   Encounter Date: 01/22/2018  OT End of Session - 01/22/18 1512    Visit Number  1    Number of Visits  9    Date for OT Re-Evaluation  02/21/18    Authorization Type  [employer]     OT Start Time  1400    OT Stop Time  1445    OT Time Calculation (min)  45 min    Activity Tolerance  Patient tolerated treatment well    Behavior During Therapy  [facility] for tasks assessed/performed       Past Medical History:  Diagnosis Date  . ALLERGIC RHINITIS 06/19/2007  . ASTHMA 08/17/2008  . Back pain 05/2016  . COPD (chronic obstructive pulmonary disease) ([place])   . GERD (gastroesophageal reflux disease)   . HYPERTENSION 06/19/2007  . Hypertension    meds controlling well  . OSTEOARTHRITIS 10/09/2009   wrists, knee.  . Pneumonia    hx of   . SINUSITIS 09/15/2007   recent issuses 2 weeks ago-clearing.    Past Surgical History:  Procedure Laterality Date  . cyst removed from neck      age 50   . KNEE ARTHROSCOPY     2 on left knee , 1 on right  . TOTAL KNEE ARTHROPLASTY Left 07/21/2014   Procedure: LEFT TOTAL KNEE ARTHROPLASTY;  Surgeon: Johnn Hai, MD;  Location: [facility];  Service: Orthopedics;  Laterality: Left;  . TOTAL KNEE ARTHROPLASTY Right 05/30/2016   Procedure: RIGHT TOTAL KNEE ARTHROPLASTY REPLACEMENT;  Surgeon: Susa Day, MD;  Location: [facility];  Service: Orthopedics;  Laterality: Right;    There were no vitals filed for this visit.  Subjective Assessment - 01/22/18 1407    Patient is accompained by:  Family member SON    Pertinent History  Lt CVA 01/18/18 w/ mild RT hemiparesis - received tPA (PMH: HTN, COPD, OA, Bilateral TKA, Rt CTS, old Lt shoulder fx)     Patient Stated  Goals  Get back to work     Currently in Pain?  No/denies        [facility] OT Assessment - 01/22/18 1410      Assessment   Medical Diagnosis  L CVA, R hemi    Referring Provider  Antony Contras    Onset Date/Surgical Date  01/18/18    Hand Dominance  Left    Prior Therapy  yes due to shoulder fracture, bilat TKR      Precautions   Precautions  Other (comment)    Precaution Comments  no driving for a week      Restrictions   Weight Bearing Restrictions  No      Balance Screen   Has the patient fallen in the past 6 months  No    Has the patient had a decrease in activity level because of a fear of falling?   No    Is the patient reluctant to leave their home because of a fear of falling?   No      Home  Environment   Astronomer    Additional Comments  Pt lives in 2 story home, but stays on main level. 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  Lift 50lb minimum; carries heavy luggage through airports (rolling duffel and backpack)    Leisure  collecting      ADL   Eating/Feeding  Independent    Grooming  Modified independent difficulty donning contacts    Upper Body Bathing  Independent    Lower Body Bathing  Independent    Upper Body Dressing  Increased time difficulty w/ smaller buttons    Lower Body Dressing  Independent    Toilet Transfer  Independent    Tub/Shower Transfer  Independent      IADL   Shopping  -- have not attempted    Light Housekeeping  Performs light daily tasks such as dishwashing, bed making taking trash out    Meal Prep  Able to complete simple warm meal prep;Able to complete simple cold meal and snack prep    Community Mobility   Relies on family or friends for transportation currently    Medication Management  Takes responsibility if medication is prepared in advance in seperate dosage    Financial Management  -- has not fully done since home from hospital       Mobility   Mobility Status  Independent      Written Expression   Dominant Hand  Left      Vision - History   Baseline Vision  Wears contact    Additional Comments  denies visual changes from stroke      Cognition   Cognition Comments  See speech evaluation for details      Sensation   Additional Comments  reports numbness first 3 fingers Rt hand (premorbid prior to stroke, MD had mentioned CTS)      Coordination   9 Hole Peg Test  Right;Left    Right 9 Hole Peg Test  30.65 sec    Left 9 Hole Peg Test  25.13 sec      Edema   Edema  none      ROM / Strength   AROM / PROM / Strength  AROM      AROM   Overall AROM Comments  BUE AROM WNL's (except Rt wrist d/t OA)       Strength   Overall Strength Comments  MMT RUE 4/5 with sh. ER and abd, otherwise WNL's. Pt reports slightly decreased endurance RUE since stroke      Hand Function   Right Hand Grip (lbs)  65 LBS (possibly partly d/t CTS as well as CVA)     Right Hand Lateral Pinch  22 lbs    Right Hand 3 Point Pinch  20 lbs    Left Hand Grip (lbs)  99 LBS                           OT Long Term Goals - 01/22/18 1519      OT LONG TERM GOAL #1   Title  Independent with HEP for Rt hand coordination, hand and shoulder strength    Time  4    Period  Weeks    Status  New    Target Date  02/21/18  OT LONG TERM GOAL #2   Title  Improve grip strength Rt hand to 75 lbs or greater    Baseline  65 lbs (Lt = 99 lbs)     Time  4    Period  Weeks    Status  New      OT LONG TERM GOAL #3   Title  Pt will demonstrate ability to perform simulated work activities including climbing up/down ladders, carrying ladders, and performing overhead coordination activities while on  ladder    Time  4    Period  Weeks    Status  New      OT LONG TERM GOAL #4   Title  Pt will be able to lift and carry up to 50 lbs for work related tasks    Time  4    Period  Weeks    Status  New      OT LONG TERM GOAL #5   Title  Pt will improve coordination as evidenced by performing 9 hole peg test in 27 sec. or less Rt hand    Baseline  30.65 sec. (Lt = 25.13 sec)     Time  4    Period  Weeks    Status  New            Plan - 01/22/18 1513    Clinical Impression Statement  Pt is a 67 y.o. male who presents to outpatient rehab s/p Lt CVA 01/18/18 with mild deficits Rt non dominant UE. Pt received tPA. Pt with decreased RUE strength, endurance and mild coordination deficits. Pt also with questionable Rt hand CTS which causing numbness and may be impeding coordination and hand strength as well    Occupational Profile and client history currently impacting functional performance  PMH: HTN, COPD, OA, Bil. TKA, Lt shoulder fx    Occupational performance deficits (Please refer to evaluation for details):  IADL's;Work;Leisure;Social Participation DRIVING    Rehab Potential  Excellent    OT Frequency  2x / week    OT Duration  4 weeks plus eval    OT Treatment/Interventions  Moist Heat;Self-care/ADL training;Therapeutic activities;Therapeutic exercise;Neuromuscular education;Functional Mobility Training;Passive range of motion;Energy conservation;Manual Therapy;Patient/family education;Cognitive remediation/compensation;Other (comment) work Administrator, Civil Service  coordination and putty HEP, theraband HEP    Clinical Decision Making  Several treatment options, min-mod task modification necessary       Patient will benefit from skilled therapeutic intervention in order to improve the following deficits and impairments:  Decreased coordination, Improper body mechanics, Decreased endurance, Impaired sensation, Decreased activity tolerance, Impaired UE functional use, Decreased  strength  Visit Diagnosis: Muscle weakness (generalized) - Plan: Ot plan of care cert/re-cert  Other lack of coordination - Plan: Ot plan of care cert/re-cert    Problem List Patient Active Problem List   Diagnosis Date Noted  . Hyperlipidemia 01/20/2018  . BPH (benign prostatic hyperplasia) 01/20/2018  . COPD (chronic obstructive pulmonary disease) ([place]) 01/20/2018  . Cerebral infarction due to embolism of left middle cerebral artery ([place]) s/p tPA 01/19/2018  . Suspected stroke patient last known to be well 2 to 3 hours ago 01/18/2018  . Primary osteoarthritis of right knee 05/30/2016  . Right knee DJD 05/30/2016  . GERD (gastroesophageal reflux disease) 07/28/2014  . Left knee DJD 07/21/2014  . Osteoarthritis 10/09/2009  . Asthma 08/17/2008  . SINUSITIS 09/15/2007  . Essential hypertension 06/19/2007  . Allergic rhinitis 06/19/2007    Carey Bullocks, OTR/L 01/22/2018, 3:29 PM  [facility] [address] [place], [place], [address] Phone: [phone]   Fax:  [phone]  Name: Charles Mccoy MRN: [id number] Date of Birth: [birth year]

## 2018-01-22 NOTE — Therapy (Signed)
[facility] [address] [place], [place], [address] Phone: [phone]   Fax:  [phone]  Speech Language Pathology Evaluation  Patient Details  Name: Charles Mccoy MRN: [id number] Date of Birth: Jan 19, 1951 Referring Provider: Dr. Antony Contras   Encounter Date: 01/22/2018  End of Session - 01/22/18 1513    Visit Number  1    Number of Visits  13    Date for SLP Re-Evaluation  03/06/18       Past Medical History:  Diagnosis Date  . ALLERGIC RHINITIS 06/19/2007  . ASTHMA 08/17/2008  . Back pain 05/2016  . COPD (chronic obstructive pulmonary disease) ([place])   . GERD (gastroesophageal reflux disease)   . HYPERTENSION 06/19/2007  . Hypertension    meds controlling well  . OSTEOARTHRITIS 10/09/2009   wrists, knee.  . Pneumonia    hx of   . SINUSITIS 09/15/2007   recent issuses 2 weeks ago-clearing.    Past Surgical History:  Procedure Laterality Date  . cyst removed from neck      age 71   . KNEE ARTHROSCOPY     2 on left knee , 1 on right  . TOTAL KNEE ARTHROPLASTY Left 07/21/2014   Procedure: LEFT TOTAL KNEE ARTHROPLASTY;  Surgeon: Johnn Hai, MD;  Location: [facility];  Service: Orthopedics;  Laterality: Left;  . TOTAL KNEE ARTHROPLASTY Right 05/30/2016   Procedure: RIGHT TOTAL KNEE ARTHROPLASTY REPLACEMENT;  Surgeon: Susa Day, MD;  Location: [facility];  Service: Orthopedics;  Laterality: Right;    There were no vitals filed for this visit.  Subjective Assessment - 01/22/18 1344    Subjective  "I got up off the couch and was totally numb on my right side"    Patient is accompained by:  Family member son Charles Mccoy    Currently in Pain?  No/denies         SLP Evaluation [facility] - 01/22/18 1025      SLP Visit Information   SLP Received On  01/22/18    Referring Provider  Dr. Antony Contras    Onset Date  01/18/18    Medical Diagnosis  CVA      Subjective   Patient/Family Stated Goal  "He's not 100%" son,  re: cognition      General Information   HPI  [name] is a 67 y.o. male with history of HTN, HLD, COPD, GERD and back pain. Pt presented with R arm numbness and slurred speech. He received IV tPA 01/18/2018. MRI (4/16) revealed scattered small acute/subacute nonhemorrhagic cortical infarcts in the posterior left frontal lobe and anterior left parietal lobe near the vertex. He was hospitalized 4/14/ to 01/20/18.      Mobility Status  walks independently      Balance Screen   Has the patient fallen in the past 6 months  No    Has the patient had a decrease in activity level because of a fear of falling?   No    Is the patient reluctant to leave their home because of a fear of falling?   No      Prior Functional Status   Cognitive/Linguistic Baseline  Within functional limits    Type of Home  House     Lives With  Spouse    Vocation  Full time employment      Cognition   Overall Cognitive Status  Impaired/Different from baseline    Area of Impairment  Attention;Memory;Awareness    Current  Attention Level  Alternating    Memory  Decreased short-term memory    Awareness  Emergent    Problem Solving  Impaired    Problem Solving Impairment  Verbal complex;Functional complex    Executive Function  Reasoning;Organizing;Decision Making;Self Monitoring    Reasoning  Impaired    Reasoning Impairment  Verbal complex;Functional complex    Organizing  Impaired    Organizing Impairment  Verbal complex;Functional complex    Behaviors  Impulsive      Oral Motor/Sensory Function   Overall Oral Motor/Sensory Function  Appears within functional limits for tasks assessed      Motor Speech   Overall Motor Speech  Appears within functional limits for tasks assessed      Standardized Assessments   Standardized Assessments   Montreal Cognitive Assessment (MOCA)    Montreal Cognitive Assessment (MOCA)   25/30      Individuals Consulted   Consulted and Agree with Results and Recommendations   Patient;Family member/caregiver    Family Member Consulted   son, Charles Mccoy                      SLP Education - 01/22/18 1351    Education provided  Yes    Education Details  areas of impairment, goal for ST, cognitive activities to do at home    Person(s) Educated  Patient;Child(ren)    Methods  Explanation;Handout    Comprehension  Verbalized understanding;Need further instruction         SLP Long Term Goals - 01/22/18 1513      SLP LONG TERM GOAL #1   Title  Pt will perform mildly complex working memory tasks with 85% accuracy and rare min A    Time  6    Period  Weeks    Status  New      SLP LONG TERM GOAL #2   Title  Pt will divide attention between 2 milldy complex cognitive linguistic tasks with 85% on each and rare min A over 2 sessions.    Time  6    Period  Weeks    Status  New      SLP LONG TERM GOAL #3   Title  Pt will self correct  errors on moderately complex cognitive linguistic tasks with rare min A over 2 sessions    Time  6    Period  Weeks    Status  New      SLP LONG TERM GOAL #4   Title  Pt will utilize external aids for short term recall with rare min A over 2 sessions.    Time  6    Period  Weeks    Status  New       Plan - 01/22/18 1512    Clinical Impression Statement  Mr. Chaikin is 67 y.o. male s/p CVA. He is referred to oupt ST due to cognitive impairments. 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 Today, he presents with mild high level cognitive linguistic deficits in memory, attention and problem solving. He scored a 25/30 on the [place]. He recalled 4/5 words immediately, and 1/5 with a delay. On figure copying, pt did not correct errors and stated "I think my box was pretty good" despite errors. Pt was not oriented to date, but was oriented to day of  the week. On informal check writing task, pt did not attend to complete directions as he was instructed  to do, indicating reduced  attention to details. Due to the high safety risk of his job, I recommend skilled ST to maximize cognition of eventual return to work.     Duration  -- 6 weeks or total of 13 visits    Treatment/Interventions  Environmental controls;Cueing hierarchy;SLP instruction and feedback;Compensatory techniques;Cognitive reorganization;Functional tasks;Internal/external aids;Patient/family education    Potential to Achieve Goals  Good       Patient will benefit from skilled therapeutic intervention in order to improve the following deficits and impairments:   Cognitive communication deficit    Problem List Patient Active Problem List   Diagnosis Date Noted  . Hyperlipidemia 01/20/2018  . BPH (benign prostatic hyperplasia) 01/20/2018  . COPD (chronic obstructive pulmonary disease) ([place]) 01/20/2018  . Cerebral infarction due to embolism of left middle cerebral artery ([place]) s/p tPA 01/19/2018  . Suspected stroke patient last known to be well 2 to 3 hours ago 01/18/2018  . Primary osteoarthritis of right knee 05/30/2016  . Right knee DJD 05/30/2016  . GERD (gastroesophageal reflux disease) 07/28/2014  . Left knee DJD 07/21/2014  . Osteoarthritis 10/09/2009  . Asthma 08/17/2008  . SINUSITIS 09/15/2007  . Essential hypertension 06/19/2007  . Allergic rhinitis 06/19/2007    Charles Mccoy, Charles Rusk MS, CCC-SLP 01/22/2018, 3:19 PM  [facility] [address] [place], [place], [address] Phone: [phone]   Fax:  [phone]  Name: Charles Mccoy MRN: [id number] Date of Birth: 05-Jul-1951

## 2018-01-22 NOTE — Therapy (Signed)
[facility] [address] [place], [place], [address] Phone: [phone]   Fax:  [phone]  Physical Therapy Evaluation  Patient Details  Name: Charles Mccoy MRN: [id number] Date of Birth: 03-Aug-1951 Referring Provider: Garvin Fila, MD   Encounter Date: 01/22/2018  PT End of Session - 01/22/18 2045    Visit Number  1    Number of Visits  5    Date for PT Re-Evaluation  02/21/18    Authorization Type  [employer] - $65 copay each clinic visit (even if he sees three disciplines); 60 VL combined PT, OT, SLP    Authorization - Visit Number  3    Authorization - Number of Visits  60    PT Start Time  0853    PT Stop Time  0933    PT Time Calculation (min)  40 min    Activity Tolerance  Patient tolerated treatment well    Behavior During Therapy  [facility] for tasks assessed/performed       Past Medical History:  Diagnosis Date  . ALLERGIC RHINITIS 06/19/2007  . ASTHMA 08/17/2008  . Back pain 05/2016  . COPD (chronic obstructive pulmonary disease) ([place])   . GERD (gastroesophageal reflux disease)   . HYPERTENSION 06/19/2007  . Hypertension    meds controlling well  . OSTEOARTHRITIS 10/09/2009   wrists, knee.  . Pneumonia    hx of   . SINUSITIS 09/15/2007   recent issuses 2 weeks ago-clearing.    Past Surgical History:  Procedure Laterality Date  . cyst removed from neck      age 67   . KNEE ARTHROSCOPY     2 on left knee , 1 on right  . TOTAL KNEE ARTHROPLASTY Left 07/21/2014   Procedure: LEFT TOTAL KNEE ARTHROPLASTY;  Surgeon: Johnn Hai, MD;  Location: [facility];  Service: Orthopedics;  Laterality: Left;  . TOTAL KNEE ARTHROPLASTY Right 05/30/2016   Procedure: RIGHT TOTAL KNEE ARTHROPLASTY REPLACEMENT;  Surgeon: Susa Day, MD;  Location: [facility];  Service: Orthopedics;  Laterality: Right;    Vitals:   01/22/18 0917  BP: (!) 161/88  Pulse: 71     Subjective Assessment - 01/22/18 0858    Subjective  Pt  reports watching TV one evening and noted RUE weakness and numbness, RLE weakness and slurred speech that did not resolve.  Pt presented to ED where he received TPA.  Pt admitted to the hospital for 2 days and then D/C home.  Pt reports continued RUE weakness and impaired coordination; mild numbness on R side of face.  Denies difficulty with gait, stairs, speech, swallowing, cognition, hearing or vision.      Patient is accompained by:  Family member    Pertinent History  COPD, back pain, HTN, bilat TKR, and OA    Patient Stated Goals  Get more strength in R hand    Currently in Pain?  No/denies         [facility] PT Assessment - 01/22/18 0905      Assessment   Medical Diagnosis  L CVA, R hemi    Referring Provider  Garvin Fila, MD    Onset Date/Surgical Date  01/18/18    Hand Dominance  Left    Prior Therapy  yes due to shoulder fracture, bilat TKR      Precautions   Precautions  Other (comment)    Precaution Comments  no driving for one week; return to work in one month; COPD,  back pain, HTN, bilat TKR, and OA      Restrictions   Weight Bearing Restrictions  No      Balance Screen   Has the patient fallen in the past 6 months  No    Has the patient had a decrease in activity level because of a fear of falling?   Yes    Is the patient reluctant to leave their home because of a fear of falling?   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  Lift 50lb minimum; carries heavy luggage through airports (rolling duffel and backpack)      Observation/Other Assessments   Focus on Therapeutic Outcomes (FOTO)   84% (16% limited; predicted 9% limitation at D/C)      Sensation   Light Touch  Appears Intact    Additional Comments  LE: sensation WFL      Coordination   Gross Motor Movements are Fluid and Coordinated  Yes    Heel Shin Test  WFL      ROM / Strength   AROM / PROM / Strength  Strength      Strength   Overall Strength  Deficits    Overall Strength Comments  LLE: 5/5, RLE: 5/5 except hip flexion and knee extension 4+/5      Ambulation/Gait   Ambulation/Gait  Yes    Ambulation/Gait Assistance  7: Independent    Ambulation Distance (Feet)  200 Feet    Assistive device  None    Gait Pattern  Narrow base of support R foot slap; pt feels this was premorbid    Ambulation Surface  Level;Indoor    Stairs  Yes    Stairs Assistance  6: Modified independent (Device/Increase time)    Stair Management Technique  No rails;Alternating pattern;Forwards    Number of Stairs  12    Height of Stairs  6    Gait Comments  Mild foot slap on R; pt feels this was premorbid due to significant ankle weakness; typically wears mid rise boots/hiking shoe for improved ankle stability when working.  One LOB on stairs when pivoting too quickly; able to self recover      Standardized Balance Assessment   Standardized Balance Assessment  Five Times Sit to Stand;10 meter walk test    Five times sit to stand comments   12.41 seconds without use of UE; pt reporting back pain today from hospital bed    10 Meter Walk  7.9 seconds or 4.59ft/sec      Functional Gait  Assessment   Gait assessed   Yes    Gait Level Surface  Walks 20 ft in less than 5.5 sec, no assistive devices, good speed, no evidence for imbalance, normal gait pattern, deviates no more than 6  in outside of the 12 in walkway width.    Change  in Gait Speed  Able to smoothly change walking speed without loss of balance or gait deviation. Deviate no more than 6 in outside of the 12 in walkway width.    Gait with Horizontal Head Turns  Performs head turns smoothly with slight change in gait velocity (eg, minor disruption to smooth gait path), deviates 6-10 in outside 12 in walkway width, or uses an assistive device.    Gait with Vertical Head Turns  Performs task with slight change in gait velocity (eg, minor disruption to smooth gait path), deviates 6 - 10 in outside 12 in walkway width or uses assistive device    Gait and Pivot Turn  Pivot turns safely within 3 sec and stops quickly with no loss of balance.    Step Over Obstacle  Is able to step over 2 stacked shoe boxes taped together (9 in total height) without changing gait speed. No evidence of imbalance.    Gait with Narrow Base of Support  Ambulates less than 4 steps heel to toe or cannot perform without assistance.    Gait with Eyes Closed  Walks 20 ft, uses assistive device, slower speed, mild gait deviations, deviates 6-10 in outside 12 in walkway width. Ambulates 20 ft in less than 9 sec but greater than 7 sec.    Ambulating Backwards  Walks 20 ft, no assistive devices, good speed, no evidence for imbalance, normal gait    Steps  Alternating feet, no rail.    Total Score  24    FGA comment:  24/30                Objective measurements completed on examination: See above findings.              PT Education - 01/22/18 2045    Education provided  Yes    Education Details  clinical findings, PT POC and goals for therapy    Person(s) Educated  Patient    Methods  Explanation    Comprehension  Verbalized understanding       PT Short Term Goals - 01/22/18 2054      PT SHORT TERM GOAL #1   Title  = LTG        PT Long Term Goals - 01/22/18 2054      PT LONG TERM GOAL #1   Title  Pt will demonstrate independence with balance HEP and walking program     Time  4    Period  Weeks    Status  New    Target Date  02/21/18      PT LONG TERM GOAL #2   Title  Pt will improve LE strength as indicated by decrease in five time sit to stand by 4 seconds    Baseline  12.41 seconds, no UE    Time  4    Period  Weeks    Status  New    Target Date  02/21/18      PT LONG TERM GOAL #3   Title  Pt will improve FGA by 4 points    Baseline  24/30    Time  4    Period  Weeks    Status  New    Target Date  02/21/18      PT LONG TERM GOAL #4   Title  Pt will improve functional endurance with improvement in 6 min walk test by 200'  Baseline  TBD    Time  4    Period  Weeks    Status  New    Target Date  02/21/18      PT LONG TERM GOAL #5   Title  Pt will ambulate x 1000 over uneven, outdoor terrain (grass, mulch, gravel) and negotiate 12 stairs, alternating sequence, no rail independently without LOB    Time  4    Period  Weeks    Status  New    Target Date  02/21/18      Additional Long Term Goals   Additional Long Term Goals  Yes      PT LONG TERM GOAL #6   Title  Pt will improve overall function as indicated by FOTO to >/= 90%    Baseline  84% function    Time  4    Period  Weeks    Status  New    Target Date  02/21/18      PT LONG TERM GOAL #7   Title  Pt will demonstrate ability to perform work simulated activities (walk with heavy backpack, pull heavy duffel, climb, pulling cables, lift heavy loads -up to 50lb, etc) independently    Time  4    Period  Weeks    Status  New    Target Date  02/21/18             Plan - 01/22/18 2047    Clinical Impression Statement  Pt is a 67 year old male referred to Neuro OPPT for evaluation s/p L MCA CVA with R hemiparesis.  Pt's PMH is significant for the following: COPD, back pain, HTN, bilat TKR, and OA. The following deficits were noted during pt's exam: impaired strength and sensation RUE and RLE, impaired balance, mild gait impairments, and impaired endurance. Pt would benefit  from skilled PT to address these impairments and functional limitations to maximize functional mobility independence, full return to work and reduce falls risk.    History and Personal Factors relevant to plan of care:  independent prior to CVA, very demanding job requirements (travel, climbing/set up of broadcasting equipment), COPD, back pain, HTN, bilat TKR, and OA    Clinical Presentation  Stable    Clinical Presentation due to:  independent prior to CVA, very demanding job requirements (travel, climbing/set up of Psychologist, forensic), COPD, back pain, HTN, bilat TKR, and OA    Clinical Decision Making  Low    Rehab Potential  Excellent    PT Frequency  1x / week    PT Duration  4 weeks    PT Treatment/Interventions  ADLs/Self Care Home Management;Electrical Stimulation;Gait training;Stair training;Functional mobility training;Therapeutic activities;Therapeutic exercise;Balance training;Neuromuscular re-education;Patient/family education    PT Next Visit Plan  6 min walk, initiate walking program.  initiate high level balance HEP - ankle strengthening, hip flexion, hip extension, knee extension, SLS, sit <> stand    PT Home Exercise Plan  work related activities: pulling cables, carrying heavy back pack, lifting atleast 50 lb, pulling heavy duffel bag for travel    Consulted and Agree with Plan of Care  Patient       Patient will benefit from skilled therapeutic intervention in order to improve the following deficits and impairments:  Decreased balance, Decreased endurance, Decreased strength, Difficulty walking, Impaired sensation, Impaired UE functional use  Visit Diagnosis: Hemiplegia and hemiparesis following cerebral infarction affecting right non-dominant side (HCC)  Muscle weakness (generalized)  Difficulty in walking, not elsewhere classified  Other  disturbances of skin sensation     Problem List Patient Active Problem List   Diagnosis Date Noted  . Hyperlipidemia  01/20/2018  . BPH (benign prostatic hyperplasia) 01/20/2018  . COPD (chronic obstructive pulmonary disease) ([place]) 01/20/2018  . Cerebral infarction due to embolism of left middle cerebral artery ([place]) s/p tPA 01/19/2018  . Suspected stroke patient last known to be well 2 to 3 hours ago 01/18/2018  . Primary osteoarthritis of right knee 05/30/2016  . Right knee DJD 05/30/2016  . GERD (gastroesophageal reflux disease) 07/28/2014  . Left knee DJD 07/21/2014  . Osteoarthritis 10/09/2009  . Asthma 08/17/2008  . SINUSITIS 09/15/2007  . Essential hypertension 06/19/2007  . Allergic rhinitis 06/19/2007   Rico Junker, PT, DPT 01/22/18    9:07 PM    [facility] [address] [place], [place], [address] Phone: [phone]   Fax:  [phone]  Name: Coady Train MRN: [id number] Date of Birth: [birth year]

## 2018-01-22 NOTE — Patient Instructions (Signed)
   Cognitive Activities you can do at home:   - [place] (easy level)  - [place]  On your computer, tablet or phone: [place] [employer]   Memory Strategies  W - Write it down  A - Associate it with something  R - Repeat it  M - Mental Image     Play the memory game  Try to remember 3-5 items on your store list without looking  Study a detailed picture in a magazine for 1 minute, then write down everything you can remember from the picture

## 2018-01-23 ENCOUNTER — Other Ambulatory Visit: Payer: Self-pay

## 2018-01-23 NOTE — Patient Outreach (Signed)
[facility]) Care Management  01/23/2018  Harshan Kearley Aug 10, 1951 [id number]   EMMI: stroke RED alert Referral date: 01/22/18 Referral reason: scheduled follow up appointment Day # 1  Telephone call to patient regarding EMMI stroke red alert. Patient states he is just waking up and request a call back at another time.    PLAN: RNCM will attempt 2nd telephone call to patient within 4 business days.  RNCM will send outreach letter for additional contact.   Quinn Plowman RN,BSN,CCM [facility] Telephonic  [phone]

## 2018-01-27 ENCOUNTER — Telehealth: Payer: Self-pay | Admitting: Nurse Practitioner

## 2018-01-27 ENCOUNTER — Other Ambulatory Visit: Payer: Self-pay | Admitting: Nurse Practitioner

## 2018-01-27 NOTE — Telephone Encounter (Signed)
Spoke with patient.  TEE and ILR scheduled for tomorrow 01/28/18 at 10AM.  Instructions, risks, benefits reviewed. Pt aware and agrees with plan. NPO after midnight tonight. Orders entered.  Chanetta Marshall, NP 01/27/2018 1:04 PM

## 2018-01-27 NOTE — Telephone Encounter (Signed)
New message    Patient calling to follow up with Charles Mccoy on scheduling TEE.

## 2018-01-28 ENCOUNTER — Ambulatory Visit (HOSPITAL_COMMUNITY)
Admission: RE | Admit: 2018-01-28 | Discharge: 2018-01-28 | Disposition: A | Discharge status: Home / Self Care | Payer: 59 | Source: Ambulatory Visit | Attending: Cardiovascular Disease | Admitting: Cardiovascular Disease

## 2018-01-28 ENCOUNTER — Encounter (HOSPITAL_COMMUNITY)
Admission: RE | Disposition: A | Discharge status: Home / Self Care | Payer: Self-pay | Source: Ambulatory Visit | Attending: Cardiovascular Disease

## 2018-01-28 ENCOUNTER — Encounter (HOSPITAL_COMMUNITY): Payer: Self-pay | Admitting: *Deleted

## 2018-01-28 ENCOUNTER — Ambulatory Visit: Payer: 59 | Admitting: Occupational Therapy

## 2018-01-28 ENCOUNTER — Other Ambulatory Visit: Payer: Self-pay

## 2018-01-28 ENCOUNTER — Ambulatory Visit (HOSPITAL_BASED_OUTPATIENT_CLINIC_OR_DEPARTMENT_OTHER)
Admission: RE | Admit: 2018-01-28 | Discharge: 2018-01-28 | Disposition: A | Discharge status: Home / Self Care | Payer: 59 | Source: Ambulatory Visit | Attending: Nurse Practitioner | Admitting: Nurse Practitioner

## 2018-01-28 ENCOUNTER — Telehealth: Payer: Self-pay | Admitting: Cardiology

## 2018-01-28 DIAGNOSIS — E785 Hyperlipidemia, unspecified: Secondary | ICD-10-CM | POA: Diagnosis not present

## 2018-01-28 DIAGNOSIS — J449 Chronic obstructive pulmonary disease, unspecified: Secondary | ICD-10-CM | POA: Insufficient documentation

## 2018-01-28 DIAGNOSIS — Z7982 Long term (current) use of aspirin: Secondary | ICD-10-CM | POA: Diagnosis not present

## 2018-01-28 DIAGNOSIS — I10 Essential (primary) hypertension: Secondary | ICD-10-CM | POA: Diagnosis not present

## 2018-01-28 DIAGNOSIS — I69353 Hemiplegia and hemiparesis following cerebral infarction affecting right non-dominant side: Secondary | ICD-10-CM | POA: Diagnosis not present

## 2018-01-28 DIAGNOSIS — Z880 Allergy status to penicillin: Secondary | ICD-10-CM | POA: Diagnosis not present

## 2018-01-28 DIAGNOSIS — R278 Other lack of coordination: Secondary | ICD-10-CM

## 2018-01-28 DIAGNOSIS — K219 Gastro-esophageal reflux disease without esophagitis: Secondary | ICD-10-CM | POA: Diagnosis not present

## 2018-01-28 DIAGNOSIS — I6389 Other cerebral infarction: Secondary | ICD-10-CM

## 2018-01-28 DIAGNOSIS — I639 Cerebral infarction, unspecified: Secondary | ICD-10-CM

## 2018-01-28 DIAGNOSIS — Z7902 Long term (current) use of antithrombotics/antiplatelets: Secondary | ICD-10-CM | POA: Diagnosis not present

## 2018-01-28 DIAGNOSIS — M1990 Unspecified osteoarthritis, unspecified site: Secondary | ICD-10-CM | POA: Insufficient documentation

## 2018-01-28 DIAGNOSIS — Z87891 Personal history of nicotine dependence: Secondary | ICD-10-CM | POA: Insufficient documentation

## 2018-01-28 DIAGNOSIS — I6523 Occlusion and stenosis of bilateral carotid arteries: Secondary | ICD-10-CM | POA: Insufficient documentation

## 2018-01-28 HISTORY — PX: TEE WITHOUT CARDIOVERSION: SHX5443

## 2018-01-28 HISTORY — PX: LOOP RECORDER INSERTION: EP1214

## 2018-01-28 SURGERY — LOOP RECORDER INSERTION

## 2018-01-28 SURGERY — ECHOCARDIOGRAM, TRANSESOPHAGEAL
Anesthesia: Moderate Sedation

## 2018-01-28 MED ORDER — SODIUM CHLORIDE 0.9 % IV SOLN: INTRAVENOUS | Status: DC

## 2018-01-28 MED ORDER — LIDOCAINE-EPINEPHRINE 1 %-1:100000 IJ SOLN
INTRAMUSCULAR | Status: DC | PRN
  Administered 2018-01-28: 20 mL

## 2018-01-28 MED ORDER — BUTAMBEN-TETRACAINE-BENZOCAINE 2-2-14 % EX AERO
INHALATION_SPRAY | CUTANEOUS | Status: DC | PRN
  Administered 2018-01-28: 2 via TOPICAL

## 2018-01-28 MED ORDER — FENTANYL CITRATE (PF) 100 MCG/2ML IJ SOLN
INTRAMUSCULAR | Status: DC | PRN
  Administered 2018-01-28 (×3): 25 ug via INTRAVENOUS

## 2018-01-28 MED ORDER — MIDAZOLAM HCL 5 MG/ML IJ SOLN
INTRAMUSCULAR | Status: AC
  Filled 2018-01-28: qty 2

## 2018-01-28 MED ORDER — FENTANYL CITRATE (PF) 100 MCG/2ML IJ SOLN
INTRAMUSCULAR | Status: AC
  Filled 2018-01-28: qty 2

## 2018-01-28 MED ORDER — LIDOCAINE-EPINEPHRINE 1 %-1:100000 IJ SOLN
INTRAMUSCULAR | Status: AC
  Filled 2018-01-28: qty 1

## 2018-01-28 MED ORDER — MIDAZOLAM HCL 10 MG/2ML IJ SOLN
INTRAMUSCULAR | Status: DC | PRN
  Administered 2018-01-28 (×3): 2 mg via INTRAVENOUS

## 2018-01-28 SURGICAL SUPPLY — 2 items
LOOP REVEAL LINQSYS (Prosthesis & Implant Heart) ×2 IMPLANT
PACK LOOP INSERTION (CUSTOM PROCEDURE TRAY) ×3 IMPLANT

## 2018-01-28 NOTE — Patient Outreach (Signed)
[facility]) Care Management  01/28/2018  Charles Mccoy Nov 08, 1950 [id number]   EMMI: stroke RED alert Referral date: 01/22/18 Referral reason: scheduled follow up appointment Day # 1 Attempt #2  Telephone call to patient regarding EMMI stroke red alert. Unable to reach patient. HIPAA compliant voice message left with call back phone numbers.  PLAN: RNCM will attempt 3rd telephone call to patient within 4 business days.     Quinn Plowman RN,BSN,CCM [facility] Telephonic  [phone]

## 2018-01-28 NOTE — Consult Note (Addendum)
ELECTROPHYSIOLOGY CONSULT NOTE  Patient ID: Charles Mccoy MRN: [id number], DOB/AGE: 65-05-1951   Admit date: 01/28/2018 Date of Consult: 01/28/2018  Primary Physician: Marletta Lor, MD Primary Cardiologist: new to [facility] Reason for Consultation: Cryptogenic stroke; recommendations regarding Implantable Loop Recorder  History of Present Illness EP has been asked to evaluate Charles Mccoy for placement of an implantable loop recorder to monitor for atrial fibrillation by Dr Leonie Man.  The patient was admitted on 01/28/2018 with right arm numbness and slurred speech.   Imaging demonstrated left brain embolic infarct.  He has undergone workup for stroke including echocardiogram and carotid dopplers.  The patient has been monitored on telemetry which has demonstrated sinus rhythm with no arrhythmias. Stroke work-up is to be completed with a TEE.   Echocardiogram this admission demonstrated EF 60-65%, no RWMA, LA 42.  Lab work is reviewed.  Prior to admission, the patient denies chest pain, shortness of breath, dizziness, palpitations, or syncope.     Past Medical History:  Diagnosis Date  . ALLERGIC RHINITIS 06/19/2007  . ASTHMA 08/17/2008  . Back pain 05/2016  . COPD (chronic obstructive pulmonary disease) ([place])   . GERD (gastroesophageal reflux disease)   . HYPERTENSION 06/19/2007  . Hypertension    meds controlling well  . OSTEOARTHRITIS 10/09/2009   wrists, knee.  . Pneumonia    hx of   . SINUSITIS 09/15/2007   recent issuses 2 weeks ago-clearing.     Surgical History:  Past Surgical History:  Procedure Laterality Date  . cyst removed from neck      age 55   . KNEE ARTHROSCOPY     2 on left knee , 1 on right  . TOTAL KNEE ARTHROPLASTY Left 07/21/2014   Procedure: LEFT TOTAL KNEE ARTHROPLASTY;  Surgeon: Johnn Hai, MD;  Location: [facility];  Service: Orthopedics;  Laterality: Left;  . TOTAL KNEE ARTHROPLASTY Right 05/30/2016   Procedure: RIGHT TOTAL KNEE ARTHROPLASTY  REPLACEMENT;  Surgeon: Susa Day, MD;  Location: [facility];  Service: Orthopedics;  Laterality: Right;     Medications Prior to Admission  Medication Sig Dispense Refill Last Dose  . amLODipine (NORVASC) 10 MG tablet Take 1 tablet (10 mg total) by mouth every morning. 90 tablet 1 01/27/2018 at Unknown time  . aspirin EC 81 MG EC tablet Take 1 tablet (81 mg total) by mouth daily. 30 tablet prn 01/27/2018 at Unknown time  . atorvastatin (LIPITOR) 40 MG tablet Take 1 tablet (40 mg total) by mouth daily at 6 PM. 30 tablet 2 01/27/2018 at Unknown time  . azelastine (ASTELIN) 0.1 % nasal spray Place 1 spray into both nostrils at bedtime.  5 01/27/2018 at Unknown time  . clopidogrel (PLAVIX) 75 MG tablet Take 1 tablet (75 mg total) by mouth daily. 90 tablet 0 01/27/2018 at Unknown time  . Fluticasone-Salmeterol 232-14 MCG/ACT AEPB Inhale 1 puff into the lungs 2 (two) times daily.  5 01/27/2018 at Unknown time  . montelukast (SINGULAIR) 10 MG tablet TAKE 1 TABLET BY MOUTH AT BEDTIME 90 tablet 1 01/27/2018 at Unknown time  . omeprazole (PRILOSEC) 20 MG capsule TAKE 1 CAPSULE (20 MG TOTAL) BY MOUTH EVERY MORNING. 90 capsule 3 01/27/2018 at Unknown time  . spironolactone (ALDACTONE) 25 MG tablet Take 1 tablet (25 mg total) by mouth daily. 90 tablet 1 01/27/2018 at Unknown time  . tamsulosin (FLOMAX) 0.4 MG CAPS capsule Take 1 capsule (0.4 mg total) by mouth daily. 30 capsule 3 01/27/2018 at Unknown time  Inpatient Medications:   Allergies:  Allergies  Allergen Reactions  . Erythromycin Base Other (See Comments)    REACTION: stomach irritation  . Penicillins Other (See Comments)    unknown    Social History   Socioeconomic History  . Marital status: Married    Spouse name: Not on file  . Number of children: Not on file  . Years of education: Not on file  . Highest education level: Not on file  Occupational History  . Not on file  Social Needs  . Financial resource strain: Not on file  . Food  insecurity:    Worry: Not on file    Inability: Not on file  . Transportation needs:    Medical: Not on file    Non-medical: Not on file  Tobacco Use  . Smoking status: Former Smoker    Types: Cigarettes    Last attempt to quit: 10/07/2001    Years since quitting: 16.3  . Smokeless tobacco: Never Used  Substance and Sexual Activity  . Alcohol use: Yes    Alcohol/week: 3.6 oz    Types: 6 Cans of beer per week    Comment: beer daily  . Drug use: No  . Sexual activity: Yes  Lifestyle  . Physical activity:    Days per week: Not on file    Minutes per session: Not on file  . Stress: Not on file  Relationships  . Social connections:    Talks on phone: Not on file    Gets together: Not on file    Attends religious service: Not on file    Active member of club or organization: Not on file    Attends meetings of clubs or organizations: Not on file    Relationship status: Not on file  . Intimate partner violence:    Fear of current or ex partner: Not on file    Emotionally abused: Not on file    Physically abused: Not on file    Forced sexual activity: Not on file  Other Topics Concern  . Not on file  Social History Narrative  . Not on file     Family History  Problem Relation Age of Onset  . Colon cancer Neg Hx   . Esophageal cancer Neg Hx   . Rectal cancer Neg Hx   . Stomach cancer Neg Hx       Review of Systems: All other systems reviewed and are otherwise negative except as noted above.  Physical Exam: There were no vitals filed for this visit.  GEN- The patient is well appearing, alert and oriented x 3 today.   Head- normocephalic, atraumatic Eyes-  Sclera clear, conjunctiva pink Ears- hearing intact Oropharynx- clear Neck- supple Lungs- Clear to ausculation bilaterally, normal work of breathing Heart- Regular rate and rhythm  GI- soft, NT, ND, + BS Extremities- no clubbing, cyanosis, or edema MS- no significant deformity or atrophy Skin- no rash or  lesion Psych- euthymic mood, full affect   Labs:   Lab Results  Component Value Date   WBC 12.6 (H) 01/18/2018   HGB 17.3 (H) 01/18/2018   HCT 51.0 01/18/2018   MCV 85.1 01/18/2018   PLT 311 01/18/2018   No results for input(s): NA, K, CL, CO2, BUN, CREATININE, CALCIUM, PROT, BILITOT, ALKPHOS, ALT, AST, GLUCOSE in the last 168 hours.  Invalid input(s): LABALBU   Radiology/Studies: Mr Virgel Paling IP Contrast  Result Date: 01/19/2018 CLINICAL DATA:  Right upper extremity hemiparesis and numbness. Slurred  speech. Status post tPA. EXAM: MRA HEAD WITHOUT CONTRAST TECHNIQUE: Angiographic images of the Circle of Willis were obtained using MRA technique without intravenous contrast. COMPARISON:  CT head without contrast 01/18/18. MRI brain from the same day. FINDINGS: Time-of-flight MRA circle-of-Willis demonstrates mild signal loss the anterior genu of the cavernous internal carotid arteries bilaterally. This may be artifactual. Normal signal is otherwise present through the ICA termini bilaterally. A1 and M1 segments are normal. The MCA bifurcations are intact. ACA and MCA branch vessels are normal. The left vertebral artery is the dominant vessel. Left PICA origin is visualized and normal. Right AICA is dominant. The basilar artery is normal. A prominent right posterior cerebral artery is present. PCA branch vessels are within normal limits. IMPRESSION: Normal variant MRA circle of Willis without significant proximal stenosis, aneurysm, or branch vessel occlusion. Electronically Signed   By: San Morelle M.D.   On: 01/19/2018 13:57   Mr Brain Wo Contrast  Result Date: 01/19/2018 CLINICAL DATA:  Acute onset of left upper extremity weakness and numbness last evening. Status post tPA. EXAM: MRI HEAD WITHOUT CONTRAST TECHNIQUE: Multiplanar, multiecho pulse sequences of the brain and surrounding structures were obtained without intravenous contrast. COMPARISON:  CT head without contrast 01/18/2018  FINDINGS: Brain: Scattered foci of restricted diffusion are present in the posterior left frontal and left parietal lobe. This corresponds with right upper extremity symptoms. Minimal white matter infarcts subjacent to this area follow cortical spinal tracts. No acute hemorrhage or mass lesion is present. T2 cortical signal changes corresponding to the areas of restricted diffusion suggest the time frame of at least 12 hours. Other periventricular and subcortical T2 hyperintensities bilaterally are moderately advanced for age. Moderate atrophy is present. Ventricles are proportionate to the degree of atrophy. The internal auditory canals are within normal limits bilaterally. Brainstem and cerebellum are normal. Vascular: Flow is present in the major intracranial arteries. Skull and upper cervical spine: The skull base is within normal limits. The craniocervical junction is normal. Degenerative changes are present at C1-2. Midline sagittal structures are otherwise unremarkable. Sinuses/Orbits: A fluid level is present in the right maxillary sinus. The right maxillary sinuses shrunken with circumferential mucosal thickening mild mucosal thickening is present in the anterior ethmoid air cells bilaterally. There is some mucosal thickening in the left frontal sinus. Small bilateral mastoid effusions are present. No obstructing nasopharyngeal lesion is present. Globes and orbits are within normal limits. IMPRESSION: 1. Scattered small acute/subacute nonhemorrhagic cortical infarcts in the posterior left frontal lobe and anterior left parietal lobe near the vertex. These correspond with the patient's abrupt onset of right upper extremity weakness and numbness. 2. Subjacent white matter restricted diffusion corresponds with the cortical spinal tracts. 3. Atrophy and white matter disease in addition to these areas is moderately advanced for age. This likely reflects the sequela of chronic microvascular ischemia. These  results will be called to the ordering clinician or representative by the Radiologist Assistant, and communication documented in the PACS or zVision Dashboard. Electronically Signed   By: San Morelle M.D.   On: 01/19/2018 14:08   Ct Head Code Stroke Wo Contrast  Result Date: 01/18/2018 CLINICAL DATA:  Code stroke. Right-sided numbness since this evening. EXAM: CT HEAD WITHOUT CONTRAST TECHNIQUE: Contiguous axial images were obtained from the base of the skull through the vertex without intravenous contrast. COMPARISON:  None. FINDINGS: Brain: No evidence of acute infarction, hemorrhage, hydrocephalus, extra-axial collection or mass lesion/mass effect. Mild to moderate low-density in the cerebral white matter attributed  to chronic small vessel ischemia given medical history, confluent in the biparietal region. Vascular: Atherosclerotic calcification.  No hyperdense vessel. Skull: No acute or aggressive finding. Incomplete C1 ring with hypertrophic anterior arch. Clivus appears hypoplastic. Sinuses/Orbits: No acute finding Other: These results were called by telephone at the time of interpretation on 01/18/2018 at 8:53 pm to Dr. Margarita Mail , who verbally acknowledged these results. ASPECTS [facility] Stroke Program Early CT Score) - Ganglionic level infarction (caudate, lentiform nuclei, internal capsule, insula, M1-M3 cortex): 7 - Supraganglionic infarction (M4-M6 cortex): 3 Total score (0-10 with 10 being normal): 10 Mild motion degradation. IMPRESSION: 1. No acute finding.  ASPECTS is 10. 2. Moderate chronic small vessel ischemia in the cerebral white matter. Electronically Signed   By: Monte Fantasia M.D.   On: 01/18/2018 20:54    12-lead ECG ST (personally reviewed) All prior EKG's in EPIC reviewed with no documented atrial fibrillation   Assessment and Plan:  1. Cryptogenic stroke The patient presented with cryptogenic stroke.  The patient has a TEE planned for this AM.  I spoke at  length with the patient about monitoring for afib with an implantable loop recorder.  Risks, benefits, and alteratives to implantable loop recorder were discussed with the patient today.   At this time, the patient is very clear in their decision to proceed with implantable loop recorder.   Tthe risks and benefits of transesophageal echocardiogram have been explained including risks of esophageal damage, perforation (1:10,000 risk), bleeding, pharyngeal hematoma as well as other potential complications associated with conscious sedation including aspiration, arrhythmia, respiratory failure and death. Alternatives to treatment were discussed, questions were answered. Patient is willing to proceed.    Wound care was reviewed with the patient (keep incision clean and dry for 3 days).  Wound check scheduled and entered in AVS.  Please call with questions.   Chanetta Marshall, NP 01/28/2018 9:18 AM  EP attending  Patient seen and examined.  Agree with the findings as noted above.  The patient has a cryptogenic stroke.  He will undergo transesophageal echo as documented above.  If there is no clear-cut explanation for his stroke, he will undergo insertion of an implantable loop recorder.  I discussed the indications, risks, goals, benefits, of the procedure and he wishes to proceed.  Cristopher Peru, MD

## 2018-01-28 NOTE — Telephone Encounter (Signed)
Opened in error

## 2018-01-28 NOTE — Patient Instructions (Signed)
  Coordination Activities  Perform the following activities for 20 minutes 1 times per day with right hand(s).   Rotate ball in fingertips (clockwise and counter-clockwise).  Flip cards 1 at a time as fast as you can.  Deal cards with your thumb (Hold deck in hand and push card off top with thumb).  Shuffle cards.  Pick up coins, buttons, marbles, dried beans/pasta of different sizes and place in container.  Pick up coins and place in container or coin bank.  Pick up coins and stack.  Pick up coins one at a time until you get 5-10 in your hand, then move coins from palm to fingertips to stack one at a time.  Twirl pen between fingers.  Screw together nuts and bolts, then unfasten.    1. Grip Strengthening (Resistive Putty)   Squeeze putty using thumb and all fingers. Repeat _20___ times. Do __2__ sessions per day.   2. Roll putty into tube on table and pinch between each finger and thumb x 10 reps each. (can do ring and small finger together)     Copyright  V[employer]. All rights reserved.

## 2018-01-28 NOTE — Progress Notes (Signed)
  Echocardiogram 2D Echocardiogram has been performed.  Estefanny Moler L Androw 01/28/2018, 10:58 AM

## 2018-01-28 NOTE — CV Procedure (Signed)
    Transesophageal Echocardiogram Note  Charles Mccoy [id number] Dec 10, 1950  Procedure: Transesophageal Echocardiogram Indications:  CVA   Procedure Details Consent: Obtained Time Out: Verified patient identification, verified procedure, site/side was marked, verified correct patient position, special equipment/implants available, Radiology Safety Procedures followed,  medications/allergies/relevent history reviewed, required imaging and test results available.  Performed  Medications:  During this procedure the patient is administered a total of Versed 6 mg and Fentanyl 75  mcg  to achieve and maintain moderate conscious sedation.  The patient's heart rate, blood pressure, and oxygen saturation are monitored continuously during the procedure. The period of conscious sedation is 30  minutes, of which I was present face-to-face 100% of this time.  Left Ventrical:  Normal LV   Mitral Valve: normal MV,  Trace - mild MR   Aortic Valve: normal  Tricuspid Valve: normal   Pulmonic Valve: normal  Left Atrium/ Left atrial appendage: no thrombi   Atrial septum:  No evidence of PFO or ASD by color or bubble study. There was a late appearing bubble in the LA that was likely from a pulmonary AVM   Aorta: very minimal plaque    Complications: No apparent complications Patient did tolerate procedure well.   Thayer Headings, [name], MD, [facility] 01/28/2018, 10:32 AM

## 2018-01-28 NOTE — Discharge Instructions (Signed)

## 2018-01-28 NOTE — Interval H&P Note (Signed)
History and Physical Interval Note:  01/28/2018 9:46 AM  Charles Mccoy  has presented today for surgery, with the diagnosis of stroke  The various methods of treatment have been discussed with the patient and family. After consideration of risks, benefits and other options for treatment, the patient has consented to  Procedure(s): TRANSESOPHAGEAL ECHOCARDIOGRAM (TEE) (N/A) as a surgical intervention .  The patient's history has been reviewed, patient examined, no change in status, stable for surgery.  I have reviewed the patient's chart and labs.  Questions were answered to the patient's satisfaction.     [name]

## 2018-01-29 ENCOUNTER — Encounter: Payer: Self-pay | Admitting: Rehabilitation

## 2018-01-29 ENCOUNTER — Ambulatory Visit: Payer: 59 | Admitting: Rehabilitation

## 2018-01-29 ENCOUNTER — Ambulatory Visit: Payer: 59

## 2018-01-29 DIAGNOSIS — I69353 Hemiplegia and hemiparesis following cerebral infarction affecting right non-dominant side: Secondary | ICD-10-CM

## 2018-01-29 DIAGNOSIS — R41841 Cognitive communication deficit: Secondary | ICD-10-CM

## 2018-01-29 DIAGNOSIS — M6281 Muscle weakness (generalized): Secondary | ICD-10-CM

## 2018-01-29 DIAGNOSIS — R262 Difficulty in walking, not elsewhere classified: Secondary | ICD-10-CM

## 2018-01-29 DIAGNOSIS — R208 Other disturbances of skin sensation: Secondary | ICD-10-CM

## 2018-01-29 NOTE — Patient Instructions (Signed)
Memory Strategies: Write it down Associate it with something else Repeat it Mental picture

## 2018-01-29 NOTE — Therapy (Signed)
[facility] [address] [place], [place], [address] Phone: [phone]   Fax:  [phone]  Occupational Therapy Treatment  Patient Details  Name: Charles Mccoy MRN: [id number] Date of Birth: [birth year] Referring Provider: Antony Contras   Encounter Date: 01/28/2018  OT End of Session - 01/28/18 1507    Visit Number  2    Number of Visits  9    Date for OT Re-Evaluation  02/21/18    Authorization Type  [employer]     OT Start Time  1450    OT Stop Time  1530    OT Time Calculation (min)  40 min    Activity Tolerance  Patient tolerated treatment well    Behavior During Therapy  [facility] for tasks assessed/performed       Past Medical History:  Diagnosis Date  . ALLERGIC RHINITIS 06/19/2007  . ASTHMA 08/17/2008  . Back pain 05/2016  . COPD (chronic obstructive pulmonary disease) ([place])   . GERD (gastroesophageal reflux disease)   . HYPERTENSION 06/19/2007  . Hypertension    meds controlling well  . OSTEOARTHRITIS 10/09/2009   wrists, knee.  . Pneumonia    hx of   . SINUSITIS 09/15/2007   recent issuses 2 weeks ago-clearing.    Past Surgical History:  Procedure Laterality Date  . cyst removed from neck      age 68   . KNEE ARTHROSCOPY     2 on left knee , 1 on right  . LOOP RECORDER INSERTION N/A 01/28/2018   Procedure: LOOP RECORDER INSERTION;  Surgeon: Evans Lance, MD;  Location: [facility];  Service: Cardiovascular;  Laterality: N/A;  . TEE WITHOUT CARDIOVERSION N/A 01/28/2018   Procedure: TRANSESOPHAGEAL ECHOCARDIOGRAM (TEE);  Surgeon: Acie Fredrickson Wonda Cheng, MD;  Location: [facility] ENDOSCOPY;  Service: Cardiovascular;  Laterality: N/A;  . TOTAL KNEE ARTHROPLASTY Left 07/21/2014   Procedure: LEFT TOTAL KNEE ARTHROPLASTY;  Surgeon: Johnn Hai, MD;  Location: [facility];  Service: Orthopedics;  Laterality: Left;  . TOTAL KNEE ARTHROPLASTY Right 05/30/2016   Procedure: RIGHT TOTAL KNEE ARTHROPLASTY REPLACEMENT;  Surgeon:  Susa Day, MD;  Location: [facility];  Service: Orthopedics;  Laterality: Right;    There were no vitals filed for this visit.  Subjective Assessment - 01/28/18 1453    Subjective   Pt s/p loop recorder placement today    Pertinent History  Lt CVA 01/18/18 w/ mild RT hemiparesis - received tPA (PMH: HTN, COPD, OA, Bilateral TKA, Rt CTS, old Lt shoulder fx)     Patient Stated Goals  Get back to work     Currently in Pain?  Yes    Pain Score  1     Pain Location  Chest    Pain Orientation  Left    Pain Descriptors / Indicators  Aching    Pain Type  Acute pain    Pain Onset  Today    Pain Frequency  Intermittent    Aggravating Factors   movement    Pain Relieving Factors  inactivity    Multiple Pain Sites  No                           OT Education - 01/29/18 0936    Education provided  Yes    Education Details  coordination and putty HEP( green)- see pt instructions    Person(s) Educated  Spouse;Patient    Methods  Demonstration;Explanation;Verbal cues;Handout  Comprehension  Verbal cues required;Returned demonstration;Verbalized understanding          OT Long Term Goals - 01/22/18 1519      OT LONG TERM GOAL #1   Title  Independent with HEP for Rt hand coordination, hand and shoulder strength    Time  4    Period  Weeks    Status  New    Target Date  02/21/18      OT LONG TERM GOAL #2   Title  Improve grip strength Rt hand to 75 lbs or greater    Baseline  65 lbs (Lt = 99 lbs)     Time  4    Period  Weeks    Status  New      OT LONG TERM GOAL #3   Title  Pt will demonstrate ability to perform simulated work activities including climbing up/down ladders, carrying ladders, and performing overhead coordination activities while on ladder    Time  4    Period  Weeks    Status  New      OT LONG TERM GOAL #4   Title  Pt will be able to lift and carry up to 50 lbs for work related tasks    Time  4    Period  Weeks    Status  New      OT LONG  TERM GOAL #5   Title  Pt will improve coordination as evidenced by performing 9 hole peg test in 27 sec. or less Rt hand    Baseline  30.65 sec. (Lt = 25.13 sec)     Time  4    Period  Weeks    Status  New            Plan - 01/28/18 1522    Clinical Impression Statement  Pt is progressing towards goals. He demonstrates understanding of inital HEP.    Occupational Profile and client history currently impacting functional performance  PMH: HTN, COPD, OA, Bil. TKA, Lt shoulder fx    Occupational performance deficits (Please refer to evaluation for details):  IADL's;Work;Leisure;Social Participation    Rehab Potential  Excellent    OT Frequency  2x / week    OT Duration  4 weeks    OT Treatment/Interventions  Moist Heat;Self-care/ADL training;Therapeutic activities;Therapeutic exercise;Neuromuscular education;Functional Mobility Training;Passive range of motion;Energy conservation;Manual Therapy;Patient/family education;Cognitive remediation/compensation;Other (comment)    Plan   theraband HEP       Patient will benefit from skilled therapeutic intervention in order to improve the following deficits and impairments:  Decreased coordination, Improper body mechanics, Decreased endurance, Impaired sensation, Decreased activity tolerance, Impaired UE functional use, Decreased strength  Visit Diagnosis: Other lack of coordination    Problem List Patient Active Problem List   Diagnosis Date Noted  . Cerebrovascular accident (CVA) ([place])   . Hyperlipidemia 01/20/2018  . BPH (benign prostatic hyperplasia) 01/20/2018  . COPD (chronic obstructive pulmonary disease) ([place]) 01/20/2018  . Cerebral infarction due to embolism of left middle cerebral artery ([place]) s/p tPA 01/19/2018  . Suspected stroke patient last known to be well 2 to 3 hours ago 01/18/2018  . Primary osteoarthritis of right knee 05/30/2016  . Right knee DJD 05/30/2016  . GERD (gastroesophageal reflux disease) 07/28/2014  .  Left knee DJD 07/21/2014  . Osteoarthritis 10/09/2009  . Asthma 08/17/2008  . SINUSITIS 09/15/2007  . Essential hypertension 06/19/2007  . Allergic rhinitis 06/19/2007    Orvan Papadakis 01/29/2018, 4:08 PM Theone Murdoch, OTR/L [phone] Phone: [phone] 4:09 PM 01/29/18 [facility] [address] [place], [place], [address] Phone: [phone]   Fax:  [phone]  Name: Charles Mccoy MRN: [id number] Date of Birth: 1951-01-04

## 2018-01-29 NOTE — Therapy (Signed)
[facility] [address] [place], [place], [address] Phone: [phone]   Fax:  [phone]  Speech Language Pathology Treatment  Patient Details  Name: Charles Mccoy MRN: [id number] Date of Birth: [birth year] Referring Provider: Dr. Antony Contras   Encounter Date: 01/29/2018  End of Session - 01/29/18 1222    Visit Number  2    Number of Visits  13    Date for SLP Re-Evaluation  03/06/18    SLP Start Time  1114    SLP Stop Time   1154    SLP Time Calculation (min)  40 min    Activity Tolerance  Patient tolerated treatment well       Past Medical History:  Diagnosis Date  . ALLERGIC RHINITIS 06/19/2007  . ASTHMA 08/17/2008  . Back pain 05/2016  . COPD (chronic obstructive pulmonary disease) ([place])   . GERD (gastroesophageal reflux disease)   . HYPERTENSION 06/19/2007  . Hypertension    meds controlling well  . OSTEOARTHRITIS 10/09/2009   wrists, knee.  . Pneumonia    hx of   . SINUSITIS 09/15/2007   recent issuses 2 weeks ago-clearing.    Past Surgical History:  Procedure Laterality Date  . cyst removed from neck      age 67   . KNEE ARTHROSCOPY     2 on left knee , 1 on right  . LOOP RECORDER INSERTION N/A 01/28/2018   Procedure: LOOP RECORDER INSERTION;  Surgeon: Evans Lance, MD;  Location: [facility];  Service: Cardiovascular;  Laterality: N/A;  . TEE WITHOUT CARDIOVERSION N/A 01/28/2018   Procedure: TRANSESOPHAGEAL ECHOCARDIOGRAM (TEE);  Surgeon: Acie Fredrickson Wonda Cheng, MD;  Location: [facility];  Service: Cardiovascular;  Laterality: N/A;  . TOTAL KNEE ARTHROPLASTY Left 07/21/2014   Procedure: LEFT TOTAL KNEE ARTHROPLASTY;  Surgeon: Johnn Hai, MD;  [facility]: [facility];  Service: Orthopedics;  Laterality: Left;  . TOTAL KNEE ARTHROPLASTY Right 05/30/2016   Procedure: RIGHT TOTAL KNEE ARTHROPLASTY REPLACEMENT;  Surgeon: Susa Day, MD;  Location: [facility];  Service: Orthopedics;  Laterality: Right;     There were no vitals filed for this visit.  Subjective Assessment - 01/29/18 1118    Currently in Pain?  Yes    Pain Score  1     Pain Location  Chest    Pain Orientation  Left    Pain Type  Acute pain    Pain Onset  Yesterday    Pain Frequency  Intermittent    Aggravating Factors   moving    Pain Relieving Factors  rest            ADULT SLP TREATMENT - 01/29/18 1119      General Information   Behavior/Cognition  Alert;Cooperative;Pleasant mood      Treatment Provided   Treatment provided  Cognitive-Linquistic      Cognitive-Linquistic Treatment   Treatment focused on  Cognition    Skilled Treatment  SLP educated pt about the 4 memory strategies - pt reported he had memory difficulties prior to CVA, and visual assistance as well as writing things down were most helpful to him. SLP shared that after CVA memory deficits may be exacerbated. When told that SLP would be asking pt these again in 10-15 minutes he said he would repeat them in order to recall. SLP cued pt to write down if he felt that would be failsafe way to recall and pt stated, "It couldn't hurt." Pt's memory tested via  Hopkins Verbal Learning Test as WNL for second two reps of 12 words, and WNL for recognition. Pt reports phone calls and appointments are things he needs to recall regularly.      Assessment / Recommendations / Plan   Plan  Continue with current plan of care      Progression Toward Goals   Progression toward goals  Progressing toward goals       SLP Education - 01/29/18 1221    Education provided  Yes    Education Details  memory strategies    Person(s) Educated  Patient    Methods  Handout;Explanation    Comprehension  Verbalized understanding         SLP Long Term Goals - 01/29/18 1227      SLP LONG TERM GOAL #1   Title  Pt will perform mildly complex working memory tasks with 85% accuracy and rare min A    Time  6    Period  Weeks    Status  On-going      SLP LONG TERM  GOAL #2   Title  Pt will divide attention between 2 milldy complex cognitive linguistic tasks with 85% on each and rare min A over 2 sessions.    Time  6    Period  Weeks    Status  On-going      SLP LONG TERM GOAL #3   Title  Pt will self correct  errors on moderately complex cognitive linguistic tasks with rare min A over 2 sessions    Time  6    Period  Weeks    Status  On-going      SLP LONG TERM GOAL #4   Title  Pt will utilize external aids for short term recall with rare min A over 2 sessions.    Time  6    Period  Weeks    Status  On-going       Plan - 01/29/18 1223    Clinical Impression Statement  Pt demonstrated WNL memory for 3/4 components of the [employer] today. Suspect pt has deficit in working memory as his recall of 12 words on initial rep was below normal but two subsequent reps were WNL. SLP educated pt today on memory strategies nad pt told SLP some areas that he needs to maintain good/WNL memory that copmensations may benefit. He states he needed to use strategies prior to CVA. He recalled all 4 strategies without cues after 15 minutes. Due to the high safety risk of his job, I recommend skilled ST to maximize cognition of eventual return to work.     Speech Therapy Frequency  2x / week    Duration  -- 6 weeks or total of 13 visits    Treatment/Interventions  Environmental controls;Cueing hierarchy;SLP instruction and feedback;Compensatory techniques;Cognitive reorganization;Functional tasks;Internal/external aids;Patient/family education    Potential to Achieve Goals  Good       Patient will benefit from skilled therapeutic intervention in order to improve the following deficits and impairments:   Cognitive communication deficit    Problem List Patient Active Problem List   Diagnosis Date Noted  . Cerebrovascular accident (CVA) ([place])   . Hyperlipidemia 01/20/2018  . BPH (benign prostatic hyperplasia) 01/20/2018  . COPD (chronic  obstructive pulmonary disease) ([place]) 01/20/2018  . Cerebral infarction due to embolism of left middle cerebral artery ([place]) s/p tPA 01/19/2018  . Suspected stroke patient last known to be well 2 to 3 hours ago 01/18/2018  .  Primary osteoarthritis of right knee 05/30/2016  . Right knee DJD 05/30/2016  . GERD (gastroesophageal reflux disease) 07/28/2014  . Left knee DJD 07/21/2014  . Osteoarthritis 10/09/2009  . Asthma 08/17/2008  . SINUSITIS 09/15/2007  . Essential hypertension 06/19/2007  . Allergic rhinitis 06/19/2007    [facility] ,MS, CCC-SLP  01/29/2018, 12:27 PM  [facility] [address] [place], [place], [address] Phone: [phone]   Fax:  [phone]   Name: Charles Mccoy MRN: [id number] Date of Birth: 1951/02/21

## 2018-01-29 NOTE — Therapy (Signed)
[facility] [address] [place], [place], [address] Phone: [phone]   Fax:  [phone]  Physical Therapy Treatment  Patient Details  Name: Charles Mccoy MRN: [id number] Date of Birth: [birth year] Referring Provider: Antony Contras   Encounter Date: 01/29/2018  PT End of Session - 01/29/18 1321    Visit Number  2    Number of Visits  5    Date for PT Re-Evaluation  02/21/18    Authorization Type  [employer] - $65 copay each clinic visit (even if he sees three disciplines); 60 VL combined PT, OT, SLP    Authorization - Visit Number  4    Authorization - Number of Visits  60    PT Start Time  1316    PT Stop Time  1402    PT Time Calculation (min)  46 min    Activity Tolerance  Patient tolerated treatment well    Behavior During Therapy  [facility] for tasks assessed/performed       Past Medical History:  Diagnosis Date  . ALLERGIC RHINITIS 06/19/2007  . ASTHMA 08/17/2008  . Back pain 05/2016  . COPD (chronic obstructive pulmonary disease) ([place])   . GERD (gastroesophageal reflux disease)   . HYPERTENSION 06/19/2007  . Hypertension    meds controlling well  . OSTEOARTHRITIS 10/09/2009   wrists, knee.  . Pneumonia    hx of   . SINUSITIS 09/15/2007   recent issuses 2 weeks ago-clearing.    Past Surgical History:  Procedure Laterality Date  . cyst removed from neck      age 67   . KNEE ARTHROSCOPY     2 on left knee , 1 on right  . LOOP RECORDER INSERTION N/A 01/28/2018   Procedure: LOOP RECORDER INSERTION;  Surgeon: Evans Lance, MD;  Location: [facility];  Service: Cardiovascular;  Laterality: N/A;  . TEE WITHOUT CARDIOVERSION N/A 01/28/2018   Procedure: TRANSESOPHAGEAL ECHOCARDIOGRAM (TEE);  Surgeon: Acie Fredrickson Wonda Cheng, MD;  Location: [facility] ENDOSCOPY;  Service: Cardiovascular;  Laterality: N/A;  . TOTAL KNEE ARTHROPLASTY Left 07/21/2014   Procedure: LEFT TOTAL KNEE ARTHROPLASTY;  Surgeon: Johnn Hai, MD;   [facility];  Service: Orthopedics;  Laterality: Left;  . TOTAL KNEE ARTHROPLASTY Right 05/30/2016   Procedure: RIGHT TOTAL KNEE ARTHROPLASTY REPLACEMENT;  Surgeon: Susa Day, MD;  [facility]: [facility];  Service: Orthopedics;  Laterality: Right;    There were no vitals filed for this visit.  Subjective Assessment - 01/29/18 1319    Subjective  Pt reports having loop recorder implanted yesterday.      Pertinent History  COPD, back pain, HTN, bilat TKR, and OA    Patient Stated Goals  Get more strength in R hand    Currently in Pain?  No/denies         [facility] PT Assessment - 01/29/18 1328      6 Minute Walk- Baseline   6 Minute Walk- Baseline  yes    BP (mmHg)  138/79    HR (bpm)  73 irregular heart beat    02 Sat (%RA)  94 %    Modified Borg Scale for Dyspnea  0- Nothing at all    Perceived Rate of Exertion (Borg)  6-      6 Minute walk- Post Test   6 Minute Walk Post Test  yes    BP (mmHg)  135/81    HR (bpm)  77    02 Sat (%RA)  95 %    Modified Borg Scale for Dyspnea  0- Nothing at all    Perceived Rate of Exertion (Borg)  9- very light      6 minute walk test results    Aerobic Endurance Distance Walked  1546    Endurance additional comments  no AD, no imbalance         Strength and balance HEP; see pt instruction for details.    NMR:  Standing on BOSU (inverted) maintaining balance x 2 sets of 30 secs, moving BOSU in clockwise and counterclockwise patterns x 10 reps each with cues for increased R lateral weight shift, mini squats on BOSU x 10 reps without UE support with cues for technique and equal WB.    5TSS: Performed x 2 reps with first at 9.56 secs and second at 7.50 secs without UE support.                    PT Education - 01/29/18 1412    Education provided  Yes    Education Details  Results of 6MWT, 5TSS, increasing walking, HEP    Person(s) Educated  Patient    Methods  Explanation    Comprehension  Verbalized understanding        PT Short Term Goals - 01/22/18 2054      PT SHORT TERM GOAL #1   Title  = LTG        PT Long Term Goals - 01/29/18 1341      PT LONG TERM GOAL #1   Title  Pt will demonstrate independence with balance HEP and walking program    Time  4    Period  Weeks    Status  New      PT LONG TERM GOAL #2   Title  Pt will improve LE strength as indicated by decrease in five time sit to stand by 4 seconds    Baseline  12.41 seconds, no UE, 9.56 (first trial) 7.50 (second trial) secs 01/29/18    Time  4    Period  Weeks    Status  Achieved      PT LONG TERM GOAL #3   Title  Pt will improve FGA by 4 points    Baseline  24/30    Time  4    Period  Weeks    Status  New      PT LONG TERM GOAL #4   Title  Pt will improve functional endurance with improvement in 6 min walk test by 200'    Baseline  1546' on 01/29/18    Time  4    Period  Weeks    Status  New      PT LONG TERM GOAL #5   Title  Pt will ambulate x 1000 over uneven, outdoor terrain (grass, mulch, gravel) and negotiate 12 stairs, alternating sequence, no rail independently without LOB    Time  4    Period  Weeks    Status  New      PT LONG TERM GOAL #6   Title  Pt will improve overall function as indicated by FOTO to >/= 90%    Baseline  84% function    Time  4    Period  Weeks    Status  New      PT LONG TERM GOAL #7   Title  Pt will demonstrate ability to perform work simulated activities (walk with heavy backpack, pull heavy duffel,  climb, pulling cables, lift heavy loads -up to 50lb, etc) independently    Time  4    Period  Weeks    Status  New            Plan - 01/29/18 1323    Clinical Impression Statement  Skilled session focused on assessment of endurance with 6MWT with distance of 1545' which is approx 300' from normal for his age group.  Discussed results and increasing consecutive walking at home, as he is walking in the neighborhood for exercise.  Pt met LTG for 5TSS today and is making excellent  progress towards remaining LTGs.  Provided pt with balance and ankle strengthening HEP today.  Pt tolerated well.     Rehab Potential  Excellent    PT Frequency  1x / week    PT Duration  4 weeks    PT Treatment/Interventions  ADLs/Self Care Home Management;Electrical Stimulation;Gait training;Stair training;Functional mobility training;Therapeutic activities;Therapeutic exercise;Balance training;Neuromuscular re-education;Patient/family education    PT Next Visit Plan  add to HEP as needed, have him simulate work related activities as able    PT [place]  work related activities: pulling cables, carrying heavy back pack, lifting atleast 50 lb, pulling heavy duffel bag for travel    Consulted and Agree with Plan of Care  Patient       Patient will benefit from skilled therapeutic intervention in order to improve the following deficits and impairments:  Decreased balance, Decreased endurance, Decreased strength, Difficulty walking, Impaired sensation, Impaired UE functional use  Visit Diagnosis: Muscle weakness (generalized)  Hemiplegia and hemiparesis following cerebral infarction affecting right non-dominant side (HCC)  Difficulty in walking, not elsewhere classified  Other disturbances of skin sensation     Problem List Patient Active Problem List   Diagnosis Date Noted  . Cerebrovascular accident (CVA) ([place])   . Hyperlipidemia 01/20/2018  . BPH (benign prostatic hyperplasia) 01/20/2018  . COPD (chronic obstructive pulmonary disease) ([place]) 01/20/2018  . Cerebral infarction due to embolism of left middle cerebral artery ([place]) s/p tPA 01/19/2018  . Suspected stroke patient last known to be well 2 to 3 hours ago 01/18/2018  . Primary osteoarthritis of right knee 05/30/2016  . Right knee DJD 05/30/2016  . GERD (gastroesophageal reflux disease) 07/28/2014  . Left knee DJD 07/21/2014  . Osteoarthritis 10/09/2009  . Asthma 08/17/2008  . SINUSITIS 09/15/2007  . Essential  hypertension 06/19/2007  . Allergic rhinitis 06/19/2007    Cameron Sprang, PT, MPT [facility] [address] [place], [place], [address] Phone: [phone]   Fax:  [phone] 01/29/18, 2:22 PM  Name: Emanuelle Bastos MRN: [id number] Date of Birth: 26-Jan-1951

## 2018-01-29 NOTE — Patient Instructions (Addendum)
ANKLE: Eversion, Bilateral (Band)    Place band around feet. Keeping heels on floor, raise toes of both feet up and away from body. Do not move hips. Hold _3-5__ seconds. Use ___green_____ band. __10_ reps per set, _2__ sets per day, _5__ days per week  Copyright  VHI. All rights reserved.   Walking on Toes    Walk on toes for __10__ feet while continuing on a straight path.  Repeat x 4 reps.  Do __2__ sessions per day.  Copyright  VHI. All rights reserved.   FUNCTIONAL MOBILITY: Heel Walking    Walk forward on heels. __10_ reps per set, _2__ sets per day, __5-7_ days per week.  Copyright  VHI. All rights reserved.    Feet Together (Compliant Surface) Arm Motion - Eyes Closed    Stand on compliant surface: __pillow or cushion______ with feet together. Close eyes and keep arms by your side.  Repeat __3__ times per session for 20 secs each. Do _2___ sessions per day.  Copyright  VHI. All rights reserved.   Feet Together (Compliant Surface) Head Motion - Eyes Closed    Stand on compliant surface: ___pillow or cushion_____ with feet together. Close eyes and move head slowly, up and down x 10 reps and side to side x 10 reps Repeat _1___ times per session. Do _2___ sessions per day.  Copyright  VHI. All rights reserved.

## 2018-01-30 ENCOUNTER — Ambulatory Visit: Payer: 59 | Admitting: Occupational Therapy

## 2018-01-30 ENCOUNTER — Ambulatory Visit: Payer: 59

## 2018-01-30 DIAGNOSIS — R278 Other lack of coordination: Secondary | ICD-10-CM

## 2018-01-30 DIAGNOSIS — I69353 Hemiplegia and hemiparesis following cerebral infarction affecting right non-dominant side: Secondary | ICD-10-CM | POA: Diagnosis not present

## 2018-01-30 DIAGNOSIS — R41841 Cognitive communication deficit: Secondary | ICD-10-CM

## 2018-01-30 DIAGNOSIS — M6281 Muscle weakness (generalized): Secondary | ICD-10-CM

## 2018-01-30 DIAGNOSIS — R208 Other disturbances of skin sensation: Secondary | ICD-10-CM

## 2018-01-30 NOTE — Therapy (Signed)
[facility] [address] [place], [place], [address] Phone: [phone]   Fax:  [phone]  Speech Language Pathology Treatment  Patient Details  Name: Charles Mccoy MRN: [id number] Date of Birth: 1951-04-04 Referring Provider: Dr. Antony Contras   Encounter Date: 01/30/2018  End of Session - 01/30/18 1238    Visit Number  3    Number of Visits  13    Date for SLP Re-Evaluation  03/06/18    SLP Start Time  1150    SLP Stop Time   1231    SLP Time Calculation (min)  41 min    Activity Tolerance  Patient tolerated treatment well       Past Medical History:  Diagnosis Date  . ALLERGIC RHINITIS 06/19/2007  . ASTHMA 08/17/2008  . Back pain 05/2016  . COPD (chronic obstructive pulmonary disease) ([place])   . GERD (gastroesophageal reflux disease)   . HYPERTENSION 06/19/2007  . Hypertension    meds controlling well  . OSTEOARTHRITIS 10/09/2009   wrists, knee.  . Pneumonia    hx of   . SINUSITIS 09/15/2007   recent issuses 2 weeks ago-clearing.    Past Surgical History:  Procedure Laterality Date  . cyst removed from neck      age 67   . KNEE ARTHROSCOPY     2 on left knee , 1 on right  . LOOP RECORDER INSERTION N/A 01/28/2018   Procedure: LOOP RECORDER INSERTION;  Surgeon: Evans Lance, MD;  Location: [facility];  Service: Cardiovascular;  Laterality: N/A;  . TEE WITHOUT CARDIOVERSION N/A 01/28/2018   Procedure: TRANSESOPHAGEAL ECHOCARDIOGRAM (TEE);  Surgeon: Acie Fredrickson Wonda Cheng, MD;  Location: [facility] ENDOSCOPY;  Service: Cardiovascular;  Laterality: N/A;  . TOTAL KNEE ARTHROPLASTY Left 07/21/2014   Procedure: LEFT TOTAL KNEE ARTHROPLASTY;  Surgeon: Johnn Hai, MD;  [facility];  Service: Orthopedics;  Laterality: Left;  . TOTAL KNEE ARTHROPLASTY Right 05/30/2016   Procedure: RIGHT TOTAL KNEE ARTHROPLASTY REPLACEMENT;  Surgeon: Susa Day, MD;  [facility];  Service: Orthopedics;  Laterality: Right;     There were no vitals filed for this visit.  Subjective Assessment - 01/30/18 1152    Subjective  "I think that Plavix they have me on is making me feel lethargic. Kyla Balzarine talk to them about that."    Currently in Pain?  No/denies            ADULT SLP TREATMENT - 01/30/18 1154      General Information   Behavior/Cognition  Alert;Cooperative;Pleasant mood      Treatment Provided   Treatment provided  Cognitive-Linquistic      Cognitive-Linquistic Treatment   Treatment focused on  Cognition    Skilled Treatment  Pt entered with homework completed 100% success. Pt recalled the memory strategies  Pt got acronym, and 2/4 correct. Working memory: SLP spelled 7-8 letter words and pt told SLP word 88% success - pt found better success with eyes closed. Picking "odd man out" in a list of 5 words, pt recalled target words 100% (7/7) - suspect pt has linguistic connection to facilitate recall. With detailed verbal instructions pt asked for repeat 40% of the time and followed directions 100% success. SLP provided homework for dividied attention - pt to complete while watching sports events this weekend.       Assessment / Recommendations / Plan   Plan  Continue with current plan of care      Progression Toward  Goals   Progression toward goals  Progressing toward goals       SLP Education - 01/30/18 1238    Education Details  definition of working memory, [employer]) Educated  Patient    Methods  Explanation;Demonstration    Comprehension  Verbalized understanding         SLP Long Term Goals - 01/30/18 1241      SLP LONG TERM GOAL #1   Title  Pt will perform mildly complex working memory tasks with 85% accuracy and rare min A    Time  6    Period  Weeks    Status  On-going      SLP LONG TERM GOAL #2   Title  Pt will divide attention between 2 milldy complex cognitive linguistic tasks with 85% on each and rare min A over 2 sessions.    Time  6    Period   Weeks    Status  On-going      SLP LONG TERM GOAL #3   Title  Pt will self correct  errors on moderately complex cognitive linguistic tasks with rare min A over 2 sessions    Time  6    Period  Weeks    Status  On-going      SLP LONG TERM GOAL #4   Title  Pt will utilize external aids for short term recall with rare min A over 2 sessions.    Time  6    Period  Weeks    Status  On-going       Plan - 01/30/18 1239    Clinical Impression Statement  Pt recalled 2/4 compensations SLP educated pt today on what working memory is. See skilled intervention for more details. Pt stated that next week, he would bring in examples of the detailed maps he has to use at work. Skilled ST remains necessary to maximize cognition of eventual return to work.     Speech Therapy Frequency  2x / week    Duration  -- 6 weeks or total of 13 visits    Treatment/Interventions  Environmental controls;Cueing hierarchy;SLP instruction and feedback;Compensatory techniques;Cognitive reorganization;Functional tasks;Internal/external aids;Patient/family education    Potential to Achieve Goals  Good       Patient will benefit from skilled therapeutic intervention in order to improve the following deficits and impairments:   Cognitive communication deficit    Problem List Patient Active Problem List   Diagnosis Date Noted  . Cerebrovascular accident (CVA) ([place])   . Hyperlipidemia 01/20/2018  . BPH (benign prostatic hyperplasia) 01/20/2018  . COPD (chronic obstructive pulmonary disease) ([place]) 01/20/2018  . Cerebral infarction due to embolism of left middle cerebral artery ([place]) s/p tPA 01/19/2018  . Suspected stroke patient last known to be well 2 to 3 hours ago 01/18/2018  . Primary osteoarthritis of right knee 05/30/2016  . Right knee DJD 05/30/2016  . GERD (gastroesophageal reflux disease) 07/28/2014  . Left knee DJD 07/21/2014  . Osteoarthritis 10/09/2009  . Asthma 08/17/2008  . SINUSITIS 09/15/2007  .  Essential hypertension 06/19/2007  . Allergic rhinitis 06/19/2007    [facility] ,MS, CCC-SLP  01/30/2018, 12:42 PM  [facility] [address] [place], [place], [address] Phone: [phone]   Fax:  [phone]   Name: Karlis Cregg MRN: [id number] Date of Birth: 1951-05-17

## 2018-01-30 NOTE — Patient Instructions (Signed)
  Please complete the assigned speech therapy homework prior to your next session and return it to the speech therapist at your next visit.  

## 2018-01-30 NOTE — Patient Instructions (Signed)
  Strengthening: Resisted Flexion   Hold tubing with ___right__ arm(s) at side. Pull forward and up. Move shoulder through pain-free range of motion. Repeat __20__ times per set.  Do _1-2_ sessions per day , every other day   Strengthening: Resisted Extension   .    Sit or stand, tubing in both hands, arms out in front. Keeping arms straight, pinch shoulder blades together and stretch arms out. Repeat _20___ times per set. Do _1-2___ sessions per day, every other day.   Elbow Flexion: Resisted   With tubing held in _right_____ hand(s) and other end secured under foot, curl arm up as far as possible. Repeat _20__ times per set. Do _1-2___ sessions per day, every other day.    Elbow Extension: Resisted   Sit in chair with resistive band secured at armrest (or hold with other hand) and _right______ elbow bent. Straighten elbow. Repeat 20__ times per set.  Do _1-2___ sessions per day, every other day.   Copyright  VHI. All rights reserved.

## 2018-01-30 NOTE — Therapy (Signed)
[facility] [address] [place], [place], [address] Phone: [phone]   Fax:  [phone]  Occupational Therapy Treatment  Patient Details  Name: Charles Mccoy MRN: [id number] Date of Birth: Feb 26, 1951 Referring Provider: Antony Contras   Encounter Date: 01/30/2018  OT End of Session - 01/30/18 1120    Visit Number  3    Number of Visits  9    Date for OT Re-Evaluation  02/21/18    Authorization Type  [employer]     OT Start Time  [phone]    OT Stop Time  0930    OT Time Calculation (min)  40 min       Past Medical History:  Diagnosis Date  . ALLERGIC RHINITIS 06/19/2007  . ASTHMA 08/17/2008  . Back pain 05/2016  . COPD (chronic obstructive pulmonary disease) ([place])   . GERD (gastroesophageal reflux disease)   . HYPERTENSION 06/19/2007  . Hypertension    meds controlling well  . OSTEOARTHRITIS 10/09/2009   wrists, knee.  . Pneumonia    hx of   . SINUSITIS 09/15/2007   recent issuses 2 weeks ago-clearing.    Past Surgical History:  Procedure Laterality Date  . cyst removed from neck      age 35   . KNEE ARTHROSCOPY     2 on left knee , 1 on right  . LOOP RECORDER INSERTION N/A 01/28/2018   Procedure: LOOP RECORDER INSERTION;  Surgeon: Evans Lance, MD;  Location: [facility];  Service: Cardiovascular;  Laterality: N/A;  . TEE WITHOUT CARDIOVERSION N/A 01/28/2018   Procedure: TRANSESOPHAGEAL ECHOCARDIOGRAM (TEE);  Surgeon: Acie Fredrickson Wonda Cheng, MD;  Location: [facility] ENDOSCOPY;  Service: Cardiovascular;  Laterality: N/A;  . TOTAL KNEE ARTHROPLASTY Left 07/21/2014   Procedure: LEFT TOTAL KNEE ARTHROPLASTY;  Surgeon: Johnn Hai, MD;  Location: [facility];  Service: Orthopedics;  Laterality: Left;  . TOTAL KNEE ARTHROPLASTY Right 05/30/2016   Procedure: RIGHT TOTAL KNEE ARTHROPLASTY REPLACEMENT;  Surgeon: Susa Day, MD;  Location: [facility];  Service: Orthopedics;  Laterality: Right;    There were no vitals filed for  this visit.  Subjective Assessment - 01/30/18 0848    Subjective   Pt reports mild pain at loop recorder placement    Pertinent History  Lt CVA 01/18/18 w/ mild RT hemiparesis - received tPA (PMH: HTN, COPD, OA, Bilateral TKA, Rt CTS, old Lt shoulder fx)     Patient Stated Goals  Get back to work     Currently in Pain?  Yes    Pain Score  1     Pain Location  Chest    Pain Orientation  Left    Pain Descriptors / Indicators  Aching    Pain Onset  Yesterday    Pain Frequency  Intermittent    Aggravating Factors   movement    Pain Relieving Factors  rest    Multiple Pain Sites  No         Treatment: Pt reports his incision is still a little sore from loop recorder placement. Standing on non compliant surface to copy a small peg design with RUE then removing pegs with RUE, min difficulty/ drops, no LOB Arm bike x 5 mins level 4 for conditioning                  OT Education - 01/30/18 1121    Education provided  Yes    Education Details  green theraband HEP, 20 reps  each, min v.c initally see pt instructions    Person(s) Educated  Patient    Methods  Explanation;Demonstration;Verbal cues;Handout    Comprehension  Verbalized understanding;Returned demonstration          OT Long Term Goals - 01/22/18 1519      OT LONG TERM GOAL #1   Title  Independent with HEP for Rt hand coordination, hand and shoulder strength    Time  4    Period  Weeks    Status  New    Target Date  02/21/18      OT LONG TERM GOAL #2   Title  Improve grip strength Rt hand to 75 lbs or greater    Baseline  65 lbs (Lt = 99 lbs)     Time  4    Period  Weeks    Status  New      OT LONG TERM GOAL #3   Title  Pt will demonstrate ability to perform simulated work activities including climbing up/down ladders, carrying ladders, and performing overhead coordination activities while on ladder    Time  4    Period  Weeks    Status  New      OT LONG TERM GOAL #4   Title  Pt will be able to  lift and carry up to 50 lbs for work related tasks    Time  4    Period  Weeks    Status  New      OT LONG TERM GOAL #5   Title  Pt will improve coordination as evidenced by performing 9 hole peg test in 27 sec. or less Rt hand    Baseline  30.65 sec. (Lt = 25.13 sec)     Time  4    Period  Weeks    Status  New            Plan - 01/30/18 1122    Clinical Impression Statement  Pt demonstrates excellent progress towards goals. He demonstrates understanding of theraband HEP.    Occupational Profile and client history currently impacting functional performance  PMH: HTN, COPD, OA, Bil. TKA, Lt shoulder fx    Occupational performance deficits (Please refer to evaluation for details):  IADL's;Work;Leisure;Social Participation    Rehab Potential  Excellent    Current Impairments/barriers affecting progress:  loop recorder placed on 01/28/18, pt is still sore    OT Frequency  2x / week    OT Duration  4 weeks    OT Treatment/Interventions  Moist Heat;Self-care/ADL training;Therapeutic activities;Therapeutic exercise;Neuromuscular education;Functional Mobility Training;Passive range of motion;Energy conservation;Manual Therapy;Patient/family education;Cognitive remediation/compensation;Other (comment)    Plan  continue to address RUE functional use , strength and coordination    Recommended Other Services  Pt hopes to d/c 1 week earlier than planned as he hopes to return to work    [employer] and Agree with Plan of Care  Patient       Patient will benefit from skilled therapeutic intervention in order to improve the following deficits and impairments:  Decreased coordination, Improper body mechanics, Decreased endurance, Impaired sensation, Decreased activity tolerance, Impaired UE functional use, Decreased strength  Visit Diagnosis: Muscle weakness (generalized)  Hemiplegia and hemiparesis following cerebral infarction affecting right non-dominant side (HCC)  Other disturbances of skin  sensation  Other lack of coordination    Problem List Patient Active Problem List   Diagnosis Date Noted  . Cerebrovascular accident (CVA) ([place])   . Hyperlipidemia 01/20/2018  . BPH (benign  prostatic hyperplasia) 01/20/2018  . COPD (chronic obstructive pulmonary disease) ([place]) 01/20/2018  . Cerebral infarction due to embolism of left middle cerebral artery ([place]) s/p tPA 01/19/2018  . Suspected stroke patient last known to be well 2 to 3 hours ago 01/18/2018  . Primary osteoarthritis of right knee 05/30/2016  . Right knee DJD 05/30/2016  . GERD (gastroesophageal reflux disease) 07/28/2014  . Left knee DJD 07/21/2014  . Osteoarthritis 10/09/2009  . Asthma 08/17/2008  . SINUSITIS 09/15/2007  . Essential hypertension 06/19/2007  . Allergic rhinitis 06/19/2007    [place] 01/30/2018, 11:25 AM  [facility] [address] [place], [place], [address] Phone: [phone]   Fax:  [phone]  Name: Wynter Grave MRN: [id number] Date of Birth: March 31, 1951

## 2018-02-02 ENCOUNTER — Other Ambulatory Visit: Payer: Self-pay

## 2018-02-02 NOTE — Patient Outreach (Signed)
[facility]) Care Management  02/02/2018  Charles Mccoy [birth year] [id number]  EMMI:stroke RED alert Referral date:01/22/18 Referral reason:scheduled follow up appointment Day #1   Telephone call to patient regarding EMMI stroke red alert. HIPAA verified with patient. Explained reason for call.  Patient states he has a scheduled appointment with his doctor on 02/04/18.  Patient states he has a scheduled follow up with the neurologist and he has started his outpatient therapy. Patient state he has had testing and has a loop recorder place. Patient states he follows up with the wound clinic for the loop recorder on tomorrow 02/03/18.  Patient denies any new symptoms. RNCM reviewed signs/ symptoms of stroke. Advised to call 911 for stroke like symptoms.  Patient states he continues to take his aspirin and plavix.  Patient denies any further needs or concerns.  RNCM informed patient he would continue to receive EMMI stroke follow up calls. Patient requested to discontinue calls.  RNCM advised patient to notify MD of any changes in condition prior to scheduled appointment. RNCM verified patient aware of 911 services for urgent/ emergent needs.   PLAN; RNCM will close patient due to patient being assessed and having no further needs.  RNCM will have case management assistant discontinue EMMI calls and requested by patient.   Quinn Plowman RN,BSN,CCM [facility] Telephonic  [phone]

## 2018-02-03 ENCOUNTER — Ambulatory Visit: Payer: Self-pay

## 2018-02-04 ENCOUNTER — Ambulatory Visit: Payer: 59 | Admitting: Physical Therapy

## 2018-02-04 ENCOUNTER — Encounter: Payer: Self-pay | Admitting: Internal Medicine

## 2018-02-04 ENCOUNTER — Encounter: Payer: Self-pay | Admitting: Physical Therapy

## 2018-02-04 ENCOUNTER — Ambulatory Visit: Payer: 59 | Admitting: Internal Medicine

## 2018-02-04 ENCOUNTER — Encounter: Payer: Self-pay | Admitting: Occupational Therapy

## 2018-02-04 ENCOUNTER — Ambulatory Visit: Payer: 59 | Attending: Neurology | Admitting: Occupational Therapy

## 2018-02-04 VITALS — BP 110/62 | HR 82 | Temp 98.4°F | Wt 245.0 lb

## 2018-02-04 DIAGNOSIS — I639 Cerebral infarction, unspecified: Secondary | ICD-10-CM | POA: Diagnosis not present

## 2018-02-04 DIAGNOSIS — R278 Other lack of coordination: Secondary | ICD-10-CM | POA: Insufficient documentation

## 2018-02-04 DIAGNOSIS — R208 Other disturbances of skin sensation: Secondary | ICD-10-CM | POA: Diagnosis present

## 2018-02-04 DIAGNOSIS — I63412 Cerebral infarction due to embolism of left middle cerebral artery: Secondary | ICD-10-CM

## 2018-02-04 DIAGNOSIS — M6281 Muscle weakness (generalized): Secondary | ICD-10-CM

## 2018-02-04 DIAGNOSIS — R262 Difficulty in walking, not elsewhere classified: Secondary | ICD-10-CM | POA: Insufficient documentation

## 2018-02-04 DIAGNOSIS — I69353 Hemiplegia and hemiparesis following cerebral infarction affecting right non-dominant side: Secondary | ICD-10-CM | POA: Diagnosis present

## 2018-02-04 DIAGNOSIS — R41841 Cognitive communication deficit: Secondary | ICD-10-CM | POA: Diagnosis present

## 2018-02-04 DIAGNOSIS — I10 Essential (primary) hypertension: Secondary | ICD-10-CM | POA: Diagnosis not present

## 2018-02-04 MED ORDER — PANTOPRAZOLE SODIUM 40 MG PO TBEC: 40.0000 mg | DELAYED_RELEASE_TABLET | Freq: Every day | ORAL | 3 refills | Status: DC

## 2018-02-04 NOTE — Patient Instructions (Addendum)
Limit your sodium (Salt) intake  Please check your blood pressure on a regular basis.  If it is consistently greater than 130/85, please make an office appointment.  Continue rehab  Follow-up 3 months

## 2018-02-04 NOTE — Progress Notes (Signed)
Subjective:    Patient ID: Charles Mccoy, male    DOB: [birth year], 67 y.o.   MRN: [id number]  HPI  Date of Admission: 01/18/2018 Date of Discharge: 01/20/2018  Attending Physician:  Garvin Fila, MD, Stroke MD Consultant(s):     Teleneurology at [facility] Patient's PCP:  Marletta Lor, MD  DISCHARGE DIAGNOSIS:  Principal Problem:   Cerebral infarction due to embolism of left middle cerebral artery [facility]) s/p IV  tPA unknown source Active Problems:   Essential hypertension   GERD (gastroesophageal reflux disease)   Hyperlipidemia   BPH (benign prostatic hyperplasia)   COPD (chronic obstructive pulmonary disease) ([place])  67 year old patient who is seen today following a recent hospital discharge for a left MCA embolic stroke.  He was treated with IV TPA and has had a nice clinical response.  He is involved with rehab and continues to make very nice progress.  Neurologically he feels that he is essentially back to baseline with perhaps some very mild residual right arm weakness.  He is left-handed.  He is anxious to return to work Remains on DAPT and will be seen in follow-up with neurology in a couple of weeks.  It is anticipated that aspirin therapy will be discontinued at that time.  He remains on Plavix as well as omeprazole.  Protonix substituted today.  Hospital records reviewed  All questions concerning recent hospital admission addressed and answered  Past Medical History:  Diagnosis Date  . ALLERGIC RHINITIS 06/19/2007  . ASTHMA 08/17/2008  . Back pain 05/2016  . COPD (chronic obstructive pulmonary disease) ([place])   . GERD (gastroesophageal reflux disease)   . HYPERTENSION 06/19/2007  . Hypertension    meds controlling well  . OSTEOARTHRITIS 10/09/2009   wrists, knee.  . Pneumonia    hx of   . SINUSITIS 09/15/2007   recent issuses 2 weeks ago-clearing.     Social History   Socioeconomic History  . Marital status: Married    Spouse name: Not on file  . Number of  children: Not on file  . Years of education: Not on file  . Highest education level: Not on file  Occupational History  . Not on file  Social Needs  . Financial resource strain: Not on file  . Food insecurity:    Worry: Not on file    Inability: Not on file  . Transportation needs:    Medical: Not on file    Non-medical: Not on file  Tobacco Use  . Smoking status: Former Smoker    Types: Cigarettes    Last attempt to quit: 10/07/2001    Years since quitting: 16.3  . Smokeless tobacco: Never Used  Substance and Sexual Activity  . Alcohol use: Yes    Alcohol/week: 3.6 oz    Types: 6 Cans of beer per week    Comment: beer daily  . Drug use: No  . Sexual activity: Yes  Lifestyle  . Physical activity:    Days per week: Not on file    Minutes per session: Not on file  . Stress: Not on file  Relationships  . Social connections:    Talks on phone: Not on file    Gets together: Not on file    Attends religious service: Not on file    Active member of club or organization: Not on file    Attends meetings of clubs or organizations: Not on file    Relationship status: Not on file  . Intimate partner  violence:    Fear of current or ex partner: Not on file    Emotionally abused: Not on file    Physically abused: Not on file    Forced sexual activity: Not on file  Other Topics Concern  . Not on file  Social History Narrative  . Not on file    Past Surgical History:  Procedure Laterality Date  . cyst removed from neck      age 67   . KNEE ARTHROSCOPY     2 on left knee , 1 on right  . LOOP RECORDER INSERTION N/A 01/28/2018   Procedure: LOOP RECORDER INSERTION;  Surgeon: Evans Lance, MD;  Location: [facility];  Service: Cardiovascular;  Laterality: N/A;  . TEE WITHOUT CARDIOVERSION N/A 01/28/2018   Procedure: TRANSESOPHAGEAL ECHOCARDIOGRAM (TEE);  Surgeon: Acie Fredrickson Wonda Cheng, MD;  Location: [facility] ENDOSCOPY;  Service: Cardiovascular;  Laterality: N/A;  . TOTAL KNEE  ARTHROPLASTY Left 07/21/2014   Procedure: LEFT TOTAL KNEE ARTHROPLASTY;  Surgeon: Johnn Hai, MD;  Location: [facility];  Service: Orthopedics;  Laterality: Left;  . TOTAL KNEE ARTHROPLASTY Right 05/30/2016   Procedure: RIGHT TOTAL KNEE ARTHROPLASTY REPLACEMENT;  Surgeon: Susa Day, MD;  Location: [facility];  Service: Orthopedics;  Laterality: Right;    Family History  Problem Relation Age of Onset  . Colon cancer Neg Hx   . Esophageal cancer Neg Hx   . Rectal cancer Neg Hx   . Stomach cancer Neg Hx     Allergies  Allergen Reactions  . Erythromycin Base Other (See Comments)    REACTION: stomach irritation  . Penicillins Other (See Comments)    unknown    Current Outpatient Medications on File Prior to Visit  Medication Sig Dispense Refill  . amLODipine (NORVASC) 10 MG tablet Take 1 tablet (10 mg total) by mouth every morning. 90 tablet 1  . aspirin EC 81 MG EC tablet Take 1 tablet (81 mg total) by mouth daily. 30 tablet prn  . atorvastatin (LIPITOR) 40 MG tablet Take 1 tablet (40 mg total) by mouth daily at 6 PM. 30 tablet 2  . azelastine (ASTELIN) 0.1 % nasal spray Place 1 spray into both nostrils at bedtime.  5  . clopidogrel (PLAVIX) 75 MG tablet Take 1 tablet (75 mg total) by mouth daily. 90 tablet 0  . Fluticasone-Salmeterol 232-14 MCG/ACT AEPB Inhale 1 puff into the lungs 2 (two) times daily.  5  . spironolactone (ALDACTONE) 25 MG tablet Take 1 tablet (25 mg total) by mouth daily. 90 tablet 1  . montelukast (SINGULAIR) 10 MG tablet TAKE 1 TABLET BY MOUTH AT BEDTIME (Patient not taking: Reported on 02/04/2018) 90 tablet 1  . tamsulosin (FLOMAX) 0.4 MG CAPS capsule Take 1 capsule (0.4 mg total) by mouth daily. (Patient not taking: Reported on 02/04/2018) 30 capsule 3   No current facility-administered medications on file prior to visit.     BP 110/62 (BP Location: Right Arm, Patient Position: Sitting, Cuff Size: Large)   Pulse 82   Temp 98.4 F (36.9 C) (Oral)   Wt 245 lb  (111.1 kg)   SpO2 95%   BMI 29.82 kg/m     Review of Systems  Constitutional: Negative for appetite change, chills, fatigue and fever.  HENT: Negative for congestion, dental problem, ear pain, hearing loss, sore throat, tinnitus, trouble swallowing and voice change.   Eyes: Negative for pain, discharge and visual disturbance.  Respiratory: Negative for cough, chest tightness, wheezing and stridor.  Cardiovascular: Negative for chest pain, palpitations and leg swelling.  Gastrointestinal: Negative for abdominal distention, abdominal pain, blood in stool, constipation, diarrhea, nausea and vomiting.  Genitourinary: Negative for difficulty urinating, discharge, flank pain, genital sores, hematuria and urgency.  Musculoskeletal: Negative for arthralgias, back pain, gait problem, joint swelling, myalgias and neck stiffness.  Skin: Negative for rash.  Neurological: Positive for speech difficulty and weakness. Negative for dizziness, syncope, numbness and headaches.  Hematological: Negative for adenopathy. Does not bruise/bleed easily.  Psychiatric/Behavioral: Negative for behavioral problems and dysphoric mood. The patient is not nervous/anxious.        Objective:   Physical Exam  Constitutional: He is oriented to person, place, and time. He appears well-developed.  HENT:  Head: Normocephalic.  Right Ear: External ear normal.  Left Ear: External ear normal.  Eyes: Conjunctivae and EOM are normal.  Neck: Normal range of motion.  Cardiovascular: Normal rate and normal heart sounds.  Pulmonary/Chest: Breath sounds normal.  Abdominal: Bowel sounds are normal.  Musculoskeletal: Normal range of motion. He exhibits no edema or tenderness.  Neurological: He is alert and oriented to person, place, and time.   Normal speech No drift Left hand grip slightly stronger than the right but he is left-handed  Psychiatric: He has a normal mood and affect. His behavior is normal.            Assessment & Plan:  Status post embolic left MCA stroke Continue short-term DAPT neurology follow-up  Hypertension.  Blood pressure well controlled presently but home blood pressure readings occasionally slightly high.  We will continue to monitor on present regimen.  Consider additional medications if blood pressure is consistently greater than 130/80  Dyslipidemia.  Continue statin therapy.  Check lipid profile in 3 months  Nyoka Cowden

## 2018-02-04 NOTE — Therapy (Signed)
[facility] [address] [place], [place], [address] Phone: [phone]   Fax:  [phone]  Physical Therapy Treatment  Patient Details  Name: Charles Mccoy MRN: [id number] Date of Birth: Dec 28, 1950 Referring Provider: Antony Contras   Encounter Date: 02/04/2018  PT End of Session - 02/04/18 1511    Visit Number  3    Number of Visits  5    Date for PT Re-Evaluation  02/21/18    Authorization Type  [employer] - $65 copay each clinic visit (even if he sees three disciplines); 60 VL combined PT, OT, SLP    Authorization - Visit Number  5    Authorization - Number of Visits  60    PT Start Time  1320    PT Stop Time  1400    PT Time Calculation (min)  40 min    Activity Tolerance  Patient tolerated treatment well    Behavior During Therapy  [name] for tasks assessed/performed       Past Medical History:  Diagnosis Date  . ALLERGIC RHINITIS 06/19/2007  . ASTHMA 08/17/2008  . Back pain 05/2016  . COPD (chronic obstructive pulmonary disease) ([place])   . GERD (gastroesophageal reflux disease)   . HYPERTENSION 06/19/2007  . Hypertension    meds controlling well  . OSTEOARTHRITIS 10/09/2009   wrists, knee.  . Pneumonia    hx of   . SINUSITIS 09/15/2007   recent issuses 2 weeks ago-clearing.    Past Surgical History:  Procedure Laterality Date  . cyst removed from neck      age 33   . KNEE ARTHROSCOPY     2 on left knee , 1 on right  . LOOP RECORDER INSERTION N/A 01/28/2018   Procedure: LOOP RECORDER INSERTION;  Surgeon: Evans Lance, MD;  Location: [facility] CV LAB;  Service: Cardiovascular;  Laterality: N/A;  . TEE WITHOUT CARDIOVERSION N/A 01/28/2018   Procedure: TRANSESOPHAGEAL ECHOCARDIOGRAM (TEE);  Surgeon: Acie Fredrickson Wonda Cheng, MD;  Location: [facility] ENDOSCOPY;  Service: Cardiovascular;  Laterality: N/A;  . TOTAL KNEE ARTHROPLASTY Left 07/21/2014   Procedure: LEFT TOTAL KNEE ARTHROPLASTY;  Surgeon: Johnn Hai, MD;   Location: [facility] ORS;  Service: Orthopedics;  Laterality: Left;  . TOTAL KNEE ARTHROPLASTY Right 05/30/2016   Procedure: RIGHT TOTAL KNEE ARTHROPLASTY REPLACEMENT;  Surgeon: Susa Day, MD;  Location: [facility] ORS;  Service: Orthopedics;  Laterality: Right;    There were no vitals filed for this visit.  Subjective Assessment - 02/04/18 1323    Subjective  Hoping to start work next week.  Goes to wound care tomorrow to have loop recorder incision checked.    Pertinent History  COPD, back pain, HTN, bilat TKR, and OA    Patient Stated Goals  Get more strength in R hand    Currently in Pain?  Yes low back strain L side                       [facility] Adult PT Treatment/Exercise - 02/04/18 1325      Therapeutic Activites    Therapeutic Activities  Work [employer]    Work Simulation  lifting items from floor x 5 reps (various weights from 3 to 8 lb) and carrying up/down A frame ladder to place on top of tall shelf.  Also performed standing on tall ladder and reaching with bilat UE in air and maintaining balance.  Carrying weighted crate (18lb) outside >500' over uneven pavement, up/down curb,  inclines, uphill and downhill on grass, level grass with supervision          Balance Exercises - 02/04/18 1352      Balance Exercises: Standing   Other Standing Exercises  in gravel and then in mulch down and back x 2 reps: high knee marching, R/L step over braiding and forwards/retro with eyes closed with supervision-min A        PT Education - 02/04/18 1511    Education provided  Yes    Education Details  work related activities, plan for final visit, added to HEP for ankle strengthening, kinesiotape to low back for mm strain    Person(s) Educated  Patient    Methods  Explanation;Demonstration;Handout    Comprehension  Verbalized understanding;Returned demonstration       PT Short Term Goals - 01/22/18 2054      PT SHORT TERM GOAL #1   Title  = LTG        PT Long Term Goals -  01/29/18 1341      PT LONG TERM GOAL #1   Title  Pt will demonstrate independence with balance HEP and walking program    Time  4    Period  Weeks    Status  New      PT LONG TERM GOAL #2   Title  Pt will improve LE strength as indicated by decrease in five time sit to stand by 4 seconds    Baseline  12.41 seconds, no UE, 9.56 (first trial) 7.50 (second trial) secs 01/29/18    Time  4    Period  Weeks    Status  Achieved      PT LONG TERM GOAL #3   Title  Pt will improve FGA by 4 points    Baseline  24/30    Time  4    Period  Weeks    Status  New      PT LONG TERM GOAL #4   Title  Pt will improve functional endurance with improvement in 6 min walk test by 200'    Baseline  1546' on 01/29/18    Time  4    Period  Weeks    Status  New      PT LONG TERM GOAL #5   Title  Pt will ambulate x 1000 over uneven, outdoor terrain (grass, mulch, gravel) and negotiate 12 stairs, alternating sequence, no rail independently without LOB    Time  4    Period  Weeks    Status  New      PT LONG TERM GOAL #6   Title  Pt will improve overall function as indicated by FOTO to >/= 90%    Baseline  84% function    Time  4    Period  Weeks    Status  New      PT LONG TERM GOAL #7   Title  Pt will demonstrate ability to perform work simulated activities (walk with heavy backpack, pull heavy duffel, climb, pulling cables, lift heavy loads -up to 50lb, etc) independently    Time  4    Period  Weeks    Status  New            Plan - 02/04/18 1512    Clinical Impression Statement  Treatment session focused on incorporating simulated work activities on a variety of surfaces; pt tolerated well and felt the gravel and ladder were accurate simulations of work duties.  No  LOB but did require light min A when performing balance challenges on gravel and mulch.  Opted not to simulate carrying heavy backpack due to recent loop recorder insertion and to reduce risk of disrupting incision.  Added two  other ankle strengthening/balance exercises to HEP.  Due to fast progress pt may be able to D/C at next visit.    Rehab Potential  Excellent    PT Frequency  1x / week    PT Duration  4 weeks    PT Treatment/Interventions  ADLs/Self Care Home Management;Electrical Stimulation;Gait training;Stair training;Functional mobility training;Therapeutic activities;Therapeutic exercise;Balance training;Neuromuscular re-education;Patient/family education    PT Next Visit Plan  check LTG, D/C?    PT Home Exercise Plan  work related activities: pulling cables, carrying heavy back pack, lifting atleast 50 lb, pulling heavy duffel bag for travel    Consulted and Agree with Plan of Care  Patient       Patient will benefit from skilled therapeutic intervention in order to improve the following deficits and impairments:  Decreased balance, Decreased endurance, Decreased strength, Difficulty walking, Impaired sensation, Impaired UE functional use  Visit Diagnosis: Muscle weakness (generalized)  Difficulty in walking, not elsewhere classified     Problem List Patient Active Problem List   Diagnosis Date Noted  . Cerebrovascular accident (CVA) ([place])   . Hyperlipidemia 01/20/2018  . BPH (benign prostatic hyperplasia) 01/20/2018  . COPD (chronic obstructive pulmonary disease) ([place]) 01/20/2018  . Cerebral infarction due to embolism of left middle cerebral artery ([place]) s/p tPA 01/19/2018  . Suspected stroke patient last known to be well 2 to 3 hours ago 01/18/2018  . Primary osteoarthritis of right knee 05/30/2016  . Right knee DJD 05/30/2016  . GERD (gastroesophageal reflux disease) 07/28/2014  . Left knee DJD 07/21/2014  . Osteoarthritis 10/09/2009  . Asthma 08/17/2008  . SINUSITIS 09/15/2007  . Essential hypertension 06/19/2007  . Allergic rhinitis 06/19/2007    Rico Junker, PT, DPT 02/04/18    3:20 PM    [facility] [address]  [place], [place], [address] Phone: [phone]   Fax:  [phone]  Name: Charles Mccoy MRN: [id number] Date of Birth: 1950/11/26

## 2018-02-04 NOTE — Therapy (Signed)
[facility] [address] [place], [place], [address] Phone: [phone]   Fax:  [phone]  Occupational Therapy Treatment  Patient Details  Name: Charles Mccoy MRN: [id number] Date of Birth: 13-May-1951 Referring Provider: Antony Contras   Encounter Date: 02/04/2018  OT End of Session - 02/04/18 1407    Visit Number  4    Number of Visits  9    Date for OT Re-Evaluation  02/21/18    Authorization Type  [employer]     OT Start Time  2774    OT Stop Time  1445    OT Time Calculation (min)  42 min       Past Medical History:  Diagnosis Date  . ALLERGIC RHINITIS 06/19/2007  . ASTHMA 08/17/2008  . Back pain 05/2016  . COPD (chronic obstructive pulmonary disease) ([place])   . GERD (gastroesophageal reflux disease)   . HYPERTENSION 06/19/2007  . Hypertension    meds controlling well  . OSTEOARTHRITIS 10/09/2009   wrists, knee.  . Pneumonia    hx of   . SINUSITIS 09/15/2007   recent issuses 2 weeks ago-clearing.    Past Surgical History:  Procedure Laterality Date  . cyst removed from neck      age 45   . KNEE ARTHROSCOPY     2 on left knee , 1 on right  . LOOP RECORDER INSERTION N/A 01/28/2018   Procedure: LOOP RECORDER INSERTION;  Surgeon: Evans Lance, MD;  Location: [facility] LAB;  Service: Cardiovascular;  Laterality: N/A;  . TEE WITHOUT CARDIOVERSION N/A 01/28/2018   Procedure: TRANSESOPHAGEAL ECHOCARDIOGRAM (TEE);  Surgeon: Acie Fredrickson Wonda Cheng, MD;  Location: [facility] ENDOSCOPY;  Service: Cardiovascular;  Laterality: N/A;  . TOTAL KNEE ARTHROPLASTY Left 07/21/2014   Procedure: LEFT TOTAL KNEE ARTHROPLASTY;  Surgeon: Johnn Hai, MD;  [facility];  Service: Orthopedics;  Laterality: Left;  . TOTAL KNEE ARTHROPLASTY Right 05/30/2016   Procedure: RIGHT TOTAL KNEE ARTHROPLASTY REPLACEMENT;  Surgeon: Susa Day, MD;  Location: [facility];  Service: Orthopedics;  Laterality: Right;    There were no vitals filed for  this visit.  Subjective Assessment - 02/04/18 1421    Subjective   mild back pain    Pertinent History  Lt CVA 01/18/18 w/ mild RT hemiparesis - received tPA (PMH: HTN, COPD, OA, Bilateral TKA, Rt CTS, old Lt shoulder fx)     Patient Stated Goals  Get back to work     Currently in Pain?  Yes    Pain Score  1     Pain Location  Back    Pain Orientation  Left    Pain Descriptors / Indicators  Aching    Pain Type  Acute pain    Pain Onset  Yesterday    Pain Frequency  Intermittent    Aggravating Factors   movement    Pain Relieving Factors  rest             Treatment:standing to place / remove blots from bolt board with bilateral UE's for increased fine motor coordination, Standing to place / remove graded clothespins from antennae with RUE for sustained pinch, alternating which finger he is pinching with. Arm bike x 6 mins level 5 for conditioning Dribbling medium ball with RUE 150 ft for timing, coordination/ endurance. Rolling ball up vertical surface with RUE x 10 reps min v.c  Standing Placing pegs into [place] pegboard on vertical surface with RUE using tweezers for sustained pinch,  min difficulty, v.c                   OT Long Term Goals - 01/22/18 1519      OT LONG TERM GOAL #1   Title  Independent with HEP for Rt hand coordination, hand and shoulder strength    Time  4    Period  Weeks    Status  New    Target Date  02/21/18      OT LONG TERM GOAL #2   Title  Improve grip strength Rt hand to 75 lbs or greater    Baseline  65 lbs (Lt = 99 lbs)     Time  4    Period  Weeks    Status  New      OT LONG TERM GOAL #3   Title  Pt will demonstrate ability to perform simulated work activities including climbing up/down ladders, carrying ladders, and performing overhead coordination activities while on ladder    Time  4    Period  Weeks    Status  New      OT LONG TERM GOAL #4   Title  Pt will be able to lift and carry up to 50 lbs for work related  tasks    Time  4    Period  Weeks    Status  New      OT LONG TERM GOAL #5   Title  Pt will improve coordination as evidenced by performing 9 hole peg test in 27 sec. or less Rt hand    Baseline  30.65 sec. (Lt = 25.13 sec)     Time  4    Period  Weeks    Status  New            Plan - 02/04/18 1408    Clinical Impression Statement  Pt is progressing towards goals. He demonstrates improved RUE strength and endurance.    Occupational Profile and client history currently impacting functional performance  PMH: HTN, COPD, OA, Bil. TKA, Lt shoulder fx    Occupational performance deficits (Please refer to evaluation for details):  IADL's;Work;Leisure;Social Participation    Rehab Potential  Excellent    Current Impairments/barriers affecting progress:  loop recorder placed on 01/28/18,    OT Frequency  2x / week    OT Duration  4 weeks    OT Treatment/Interventions  Moist Heat;Self-care/ADL training;Therapeutic activities;Therapeutic exercise;Neuromuscular education;Functional Mobility Training;Passive range of motion;Energy conservation;Manual Therapy;Patient/family education;Cognitive remediation/compensation;Other (comment)    Plan  pt hopes to d/c before May 13,    continue to address RUE functional use , strength and coordination    Recommended Other Services  Pt hopes to d/c 1 week earlier than planned as he hopes to return to work    [employer] and Agree with Plan of Care  Patient       Patient will benefit from skilled therapeutic intervention in order to improve the following deficits and impairments:  Decreased coordination, Improper body mechanics, Decreased endurance, Impaired sensation, Decreased activity tolerance, Impaired UE functional use, Decreased strength  Visit Diagnosis: Muscle weakness (generalized)  Hemiplegia and hemiparesis following cerebral infarction affecting right non-dominant side (HCC)  Other disturbances of skin sensation  Other lack of  coordination    Problem List Patient Active Problem List   Diagnosis Date Noted  . Cerebrovascular accident (CVA) ([place])   . Hyperlipidemia 01/20/2018  . BPH (benign prostatic hyperplasia) 01/20/2018  . COPD (chronic obstructive pulmonary disease) ([place])  01/20/2018  . Cerebral infarction due to embolism of left middle cerebral artery ([place]) s/p tPA 01/19/2018  . Suspected stroke patient last known to be well 2 to 3 hours ago 01/18/2018  . Primary osteoarthritis of right knee 05/30/2016  . Right knee DJD 05/30/2016  . GERD (gastroesophageal reflux disease) 07/28/2014  . Left knee DJD 07/21/2014  . Osteoarthritis 10/09/2009  . Asthma 08/17/2008  . SINUSITIS 09/15/2007  . Essential hypertension 06/19/2007  . Allergic rhinitis 06/19/2007    Florie Carico 02/04/2018, 2:23 PM Theone Murdoch, OTR/L [phone] Phone: [phone] 2:29 PM 02/04/18 [facility] [address] [place], [place], [address] Phone: [phone]   Fax:  [phone]  Name: Charles Mccoy MRN: [id number] Date of Birth: [birth year]

## 2018-02-04 NOTE — Patient Instructions (Signed)
SINGLE LIMB STANCE    Stance: single leg on floor. Raise leg. Hold _10__ seconds. Repeat with other leg. __3_ reps per set   Tandem Walking    Walk with each foot directly in front of other, heel of one foot touching toes of other foot with each step. Both feet straight ahead.

## 2018-02-05 ENCOUNTER — Ambulatory Visit: Payer: 59 | Admitting: Speech Pathology

## 2018-02-05 ENCOUNTER — Encounter: Payer: Self-pay | Admitting: Speech Pathology

## 2018-02-05 ENCOUNTER — Ambulatory Visit (INDEPENDENT_AMBULATORY_CARE_PROVIDER_SITE_OTHER): Payer: Self-pay | Admitting: *Deleted

## 2018-02-05 ENCOUNTER — Ambulatory Visit: Payer: 59 | Admitting: Occupational Therapy

## 2018-02-05 DIAGNOSIS — R278 Other lack of coordination: Secondary | ICD-10-CM

## 2018-02-05 DIAGNOSIS — R208 Other disturbances of skin sensation: Secondary | ICD-10-CM

## 2018-02-05 DIAGNOSIS — I69353 Hemiplegia and hemiparesis following cerebral infarction affecting right non-dominant side: Secondary | ICD-10-CM

## 2018-02-05 DIAGNOSIS — M6281 Muscle weakness (generalized): Secondary | ICD-10-CM

## 2018-02-05 DIAGNOSIS — I639 Cerebral infarction, unspecified: Secondary | ICD-10-CM

## 2018-02-05 DIAGNOSIS — R41841 Cognitive communication deficit: Secondary | ICD-10-CM

## 2018-02-05 LAB — CUP PACEART INCLINIC DEVICE CHECK
Date Time Interrogation Session: 20190502171254
MDC IDC PG IMPLANT DT: 20190424

## 2018-02-05 NOTE — Therapy (Signed)
[facility] [address] [place], [place], [address] Phone: [phone]   Fax:  [phone]  Speech Language Pathology Treatment  Patient Details  Name: Charles Mccoy MRN: [id number] Date of Birth: 02-23-1951 Referring Provider: Dr. Antony Contras   Encounter Date: 02/05/2018  End of Session - 02/05/18 0931    Visit Number  4    Number of Visits  13    Date for SLP Re-Evaluation  03/06/18    SLP Start Time  0846    SLP Stop Time   0930    SLP Time Calculation (min)  44 min    Activity Tolerance  Patient tolerated treatment well       Past Medical History:  Diagnosis Date  . ALLERGIC RHINITIS 06/19/2007  . ASTHMA 08/17/2008  . Back pain 05/2016  . COPD (chronic obstructive pulmonary disease) ([place])   . GERD (gastroesophageal reflux disease)   . HYPERTENSION 06/19/2007  . Hypertension    meds controlling well  . OSTEOARTHRITIS 10/09/2009   wrists, knee.  . Pneumonia    hx of   . SINUSITIS 09/15/2007   recent issuses 2 weeks ago-clearing.    Past Surgical History:  Procedure Laterality Date  . cyst removed from neck      age 17   . KNEE ARTHROSCOPY     2 on left knee , 1 on right  . LOOP RECORDER INSERTION N/A 01/28/2018   Procedure: LOOP RECORDER INSERTION;  Surgeon: Evans Lance, MD;  Location: [facility] CV LAB;  Service: Cardiovascular;  Laterality: N/A;  . TEE WITHOUT CARDIOVERSION N/A 01/28/2018   Procedure: TRANSESOPHAGEAL ECHOCARDIOGRAM (TEE);  Surgeon: Acie Fredrickson Wonda Cheng, MD;  Location: [facility] ENDOSCOPY;  Service: Cardiovascular;  Laterality: N/A;  . TOTAL KNEE ARTHROPLASTY Left 07/21/2014   Procedure: LEFT TOTAL KNEE ARTHROPLASTY;  Surgeon: Johnn Hai, MD;  [facility];  Service: Orthopedics;  Laterality: Left;  . TOTAL KNEE ARTHROPLASTY Right 05/30/2016   Procedure: RIGHT TOTAL KNEE ARTHROPLASTY REPLACEMENT;  Surgeon: Susa Day, MD;  [facility];  Service: Orthopedics;  Laterality: Right;     There were no vitals filed for this visit.  Subjective Assessment - 02/05/18 0849    Subjective  "I'm just now realizing everything that happened to me and my recovery - it happened so fast"    Currently in Pain?  No/denies            ADULT SLP TREATMENT - 02/05/18 0850      General Information   Behavior/Cognition  Alert;Cooperative;Pleasant mood      Treatment Provided   Treatment provided  Cognitive-Linquistic      Cognitive-Linquistic Treatment   Treatment focused on  Cognition    Skilled Treatment  Divided attention homework complete and accurate. Divided attention and working memory targeted with dividing attention  between mildly complex writing (category matrix) and mildly complex auditory money counting task  - recalling amount given by ST. Pt 100% on category/written task,  he did not request repetition of complex money amounts. Divided attention between written task and auditory/verbal task with 100% accuracy. Moderately complex math reasoning with minimal extended time and rare min A. He ID'd and corrected errors with mod I.       Assessment / Recommendations / Plan   Plan  Continue with current plan of care      Progression Toward Goals   Progression toward goals  Progressing toward goals  SLP Long Term Goals - 02/05/18 0931      SLP LONG TERM GOAL #1   Title  Pt will perform mildly complex working memory tasks with 85% accuracy and rare min A    Time  5    Period  Weeks    Status  On-going      SLP LONG TERM GOAL #2   Title  Pt will divide attention between 2 milldy complex cognitive linguistic tasks with 85% on each and rare min A over 2 sessions.    Baseline  02/05/18;     Time  5    Period  Weeks    Status  On-going      SLP LONG TERM GOAL #3   Title  Pt will self correct  errors on moderately complex cognitive linguistic tasks with rare min A over 2 sessions    Baseline  02/05/18;     Time  5    Period  Weeks    Status  On-going       SLP LONG TERM GOAL #4   Title  Pt will utilize external aids for short term recall with rare min A over 2 sessions.    Baseline  02/05/18;     Time  5    Period  Weeks    Status  On-going       Plan - 02/05/18 5400    Clinical Impression Statement  Pt forgot maps and work examples, divided attention on mildly complex tasks with supervision cues. Pt recalled memory strategies    Speech Therapy Frequency  2x / week       Patient will benefit from skilled therapeutic intervention in order to improve the following deficits and impairments:   Cognitive communication deficit    Problem List Patient Active Problem List   Diagnosis Date Noted  . Cerebrovascular accident (CVA) ([place])   . Hyperlipidemia 01/20/2018  . BPH (benign prostatic hyperplasia) 01/20/2018  . COPD (chronic obstructive pulmonary disease) ([place]) 01/20/2018  . Cerebral infarction due to embolism of left middle cerebral artery ([place]) s/p tPA 01/19/2018  . Suspected stroke patient last known to be well 2 to 3 hours ago 01/18/2018  . Primary osteoarthritis of right knee 05/30/2016  . Right knee DJD 05/30/2016  . GERD (gastroesophageal reflux disease) 07/28/2014  . Left knee DJD 07/21/2014  . Osteoarthritis 10/09/2009  . Asthma 08/17/2008  . SINUSITIS 09/15/2007  . Essential hypertension 06/19/2007  . Allergic rhinitis 06/19/2007    Mehreen Azizi, Annye Rusk MS, CCC-SLP 02/05/2018, 9:32 AM  [facility] [address] [place], [place], [address] Phone: [phone]   Fax:  [phone]   Name: Charles Mccoy MRN: [id number] Date of Birth: [birth year]

## 2018-02-05 NOTE — Therapy (Signed)
[facility] [address] [place], [place], [address] Phone: [phone]   Fax:  [phone]  Occupational Therapy Treatment  Patient Details  Name: Charles Mccoy MRN: [id number] Date of Birth: 04-01-1951 Referring Provider: Antony Contras   Encounter Date: 02/05/2018  OT End of Session - 02/05/18 0839    Visit Number  5    Number of Visits  9    Date for OT Re-Evaluation  02/21/18    Authorization Type  [employer]     OT Start Time  0804    OT Stop Time  0845    OT Time Calculation (min)  41 min       Past Medical History:  Diagnosis Date  . ALLERGIC RHINITIS 06/19/2007  . ASTHMA 08/17/2008  . Back pain 05/2016  . COPD (chronic obstructive pulmonary disease) ([place])   . GERD (gastroesophageal reflux disease)   . HYPERTENSION 06/19/2007  . Hypertension    meds controlling well  . OSTEOARTHRITIS 10/09/2009   wrists, knee.  . Pneumonia    hx of   . SINUSITIS 09/15/2007   recent issuses 2 weeks ago-clearing.    Past Surgical History:  Procedure Laterality Date  . cyst removed from neck      age 60   . KNEE ARTHROSCOPY     2 on left knee , 1 on right  . LOOP RECORDER INSERTION N/A 01/28/2018   Procedure: LOOP RECORDER INSERTION;  Surgeon: Evans Lance, MD;  Location: [facility];  Service: Cardiovascular;  Laterality: N/A;  . TEE WITHOUT CARDIOVERSION N/A 01/28/2018   Procedure: TRANSESOPHAGEAL ECHOCARDIOGRAM (TEE);  Surgeon: Acie Fredrickson Wonda Cheng, MD;  Location: [facility];  Service: Cardiovascular;  Laterality: N/A;  . TOTAL KNEE ARTHROPLASTY Left 07/21/2014   Procedure: LEFT TOTAL KNEE ARTHROPLASTY;  Surgeon: Johnn Hai, MD;  Location: [facility];  Service: Orthopedics;  Laterality: Left;  . TOTAL KNEE ARTHROPLASTY Right 05/30/2016   Procedure: RIGHT TOTAL KNEE ARTHROPLASTY REPLACEMENT;  Surgeon: Susa Day, MD;  Location: [facility];  Service: Orthopedics;  Laterality: Right;    There were no vitals filed for  this visit.  Subjective Assessment - 02/05/18 0841    Pertinent History  Lt CVA 01/18/18 w/ mild RT hemiparesis - received tPA (PMH: HTN, COPD, OA, Bilateral TKA, Rt CTS, old Lt shoulder fx)     Limitations  loop recorder placed on 01/28/18    Patient Stated Goals  Get back to work     Currently in Pain?  No/denies             Treatment: fine motor coordination task placing grooved pegs with RUE with in hand manipulation, min difficulty/ v.c Ambulating carrying 25 lbs 150 ft with bilateral UE's then lifting 25 lbs from floor to mat x 5 reps min v.c for proper positioning/ good body mechanics Dribbling a ball with RUE for increased coordination and timing, rolling ball up vertical surface with RUE x 10 reps, min v.c Arm bike x 8 mins level 4 for conditioning. Standing on non compliant surface to retrieve items from floor to place on vertical antennae with RUE, no LOB, pt demonstrates good use of RUE. Reviewed green theraband HEP, 15 reps each, added shoulder horizontal abduction, min v.c initially then pt returned demonstration.                  OT Long Term Goals - 01/22/18 1519      OT LONG TERM GOAL #1  Title  Independent with HEP for Rt hand coordination, hand and shoulder strength    Time  4    Period  Weeks    Status  New    Target Date  02/21/18      OT LONG TERM GOAL #2   Title  Improve grip strength Rt hand to 75 lbs or greater    Baseline  65 lbs (Lt = 99 lbs)     Time  4    Period  Weeks    Status  New      OT LONG TERM GOAL #3   Title  Pt will demonstrate ability to perform simulated work activities including climbing up/down ladders, carrying ladders, and performing overhead coordination activities while on ladder    Time  4    Period  Weeks    Status  New      OT LONG TERM GOAL #4   Title  Pt will be able to lift and carry up to 50 lbs for work related tasks    Time  4    Period  Weeks    Status  New      OT LONG TERM GOAL #5   Title  Pt  will improve coordination as evidenced by performing 9 hole peg test in 27 sec. or less Rt hand    Baseline  30.65 sec. (Lt = 25.13 sec)     Time  4    Period  Weeks    Status  New            Plan - 02/05/18 1950    Clinical Impression Statement  Pt is progressing towards goals .    Occupational Profile and client history currently impacting functional performance  PMH: HTN, COPD, OA, Bil. TKA, Lt shoulder fx    Occupational performance deficits (Please refer to evaluation for details):  IADL's;Work;Leisure;Social Participation    Rehab Potential  Excellent    Current Impairments/barriers affecting progress:  loop recorder placed on 01/28/18,    OT Frequency  2x / week    OT Duration  4 weeks    OT Treatment/Interventions  Moist Heat;Self-care/ADL training;Therapeutic activities;Therapeutic exercise;Neuromuscular education;Functional Mobility Training;Passive range of motion;Energy conservation;Manual Therapy;Patient/family education;Cognitive remediation/compensation;Other (comment)    Plan  pt hopes to d/c before May 13,    continue to address RUE functional use , strength and coordination in prep for work activities    Consulted and Agree with Plan of Care  Patient       Patient will benefit from skilled therapeutic intervention in order to improve the following deficits and impairments:  Decreased coordination, Improper body mechanics, Decreased endurance, Impaired sensation, Decreased activity tolerance, Impaired UE functional use, Decreased strength  Visit Diagnosis: Muscle weakness (generalized)  Hemiplegia and hemiparesis following cerebral infarction affecting right non-dominant side (HCC)  Other disturbances of skin sensation  Other lack of coordination    Problem List Patient Active Problem List   Diagnosis Date Noted  . Cerebrovascular accident (CVA) ([place])   . Hyperlipidemia 01/20/2018  . BPH (benign prostatic hyperplasia) 01/20/2018  . COPD (chronic  obstructive pulmonary disease) ([place]) 01/20/2018  . Cerebral infarction due to embolism of left middle cerebral artery ([place]) s/p tPA 01/19/2018  . Suspected stroke patient last known to be well 2 to 3 hours ago 01/18/2018  . Primary osteoarthritis of right knee 05/30/2016  . Right knee DJD 05/30/2016  . GERD (gastroesophageal reflux disease) 07/28/2014  . Left knee DJD 07/21/2014  . Osteoarthritis 10/09/2009  .  Asthma 08/17/2008  . SINUSITIS 09/15/2007  . Essential hypertension 06/19/2007  . Allergic rhinitis 06/19/2007    RINE,KATHRYN 02/05/2018, 10:43 AM Theone Murdoch, OTR/L [phone] Phone: [phone] 11:13 AM 02/05/18  [facility] [address] [place], [place], [address] Phone: [phone]   Fax:  [phone]  Name: Charles Mccoy MRN: [id number] Date of Birth: Aug 21, 1951

## 2018-02-05 NOTE — Progress Notes (Signed)
Wound check appointment. Steri-strips removed. Wound without redness or edema. Incision edges approximated, wound well healed. Normal device function. Battery status: good. R-waves 0.70mV. No symptom, tachy, pause, brady, or AF episodes. Patient educated about wound care and Carelink monitor. ROV with GT PRN.

## 2018-02-06 ENCOUNTER — Ambulatory Visit: Payer: 59 | Admitting: Speech Pathology

## 2018-02-06 ENCOUNTER — Encounter: Payer: Self-pay | Admitting: Speech Pathology

## 2018-02-06 DIAGNOSIS — M6281 Muscle weakness (generalized): Secondary | ICD-10-CM | POA: Diagnosis not present

## 2018-02-06 DIAGNOSIS — R41841 Cognitive communication deficit: Secondary | ICD-10-CM

## 2018-02-06 NOTE — Therapy (Signed)
[facility] [address] [place], [place], [address] Phone: [phone]   Fax:  [phone]  Speech Language Pathology Treatment  Patient Details  Name: Charles Mccoy MRN: [id number] Date of Birth: [birth year] Referring Provider: Dr. Antony Contras   Encounter Date: 02/06/2018  End of Session - 02/06/18 1256    Visit Number  5    Number of Visits  13    Date for SLP Re-Evaluation  03/06/18    SLP Start Time  0845    SLP Stop Time   0928    SLP Time Calculation (min)  43 min    Activity Tolerance  Patient tolerated treatment well       Past Medical History:  Diagnosis Date  . ALLERGIC RHINITIS 06/19/2007  . ASTHMA 08/17/2008  . Back pain 05/2016  . COPD (chronic obstructive pulmonary disease) ([place])   . GERD (gastroesophageal reflux disease)   . HYPERTENSION 06/19/2007  . Hypertension    meds controlling well  . OSTEOARTHRITIS 10/09/2009   wrists, knee.  . Pneumonia    hx of   . SINUSITIS 09/15/2007   recent issuses 2 weeks ago-clearing.    Past Surgical History:  Procedure Laterality Date  . cyst removed from neck      age 24   . KNEE ARTHROSCOPY     2 on left knee , 1 on right  . LOOP RECORDER INSERTION N/A 01/28/2018   Procedure: LOOP RECORDER INSERTION;  Surgeon: Evans Lance, MD;  Location: [facility];  Service: Cardiovascular;  Laterality: N/A;  . TEE WITHOUT CARDIOVERSION N/A 01/28/2018   Procedure: TRANSESOPHAGEAL ECHOCARDIOGRAM (TEE);  Surgeon: Acie Fredrickson Wonda Cheng, MD;  Location: [facility] ENDOSCOPY;  Service: Cardiovascular;  Laterality: N/A;  . TOTAL KNEE ARTHROPLASTY Left 07/21/2014   Procedure: LEFT TOTAL KNEE ARTHROPLASTY;  Surgeon: Johnn Hai, MD;  Location: [facility];  Service: Orthopedics;  Laterality: Left;  . TOTAL KNEE ARTHROPLASTY Right 05/30/2016   Procedure: RIGHT TOTAL KNEE ARTHROPLASTY REPLACEMENT;  Surgeon: Susa Day, MD;  Location: [facility];  Service: Orthopedics;  Laterality: Right;     There were no vitals filed for this visit.  Subjective Assessment - 02/06/18 0852    Subjective  "It was fun- the commercials are on the back"    Currently in Pain?  No/denies            ADULT SLP TREATMENT - 02/06/18 0852      General Information   Behavior/Cognition  Alert;Cooperative;Pleasant mood      Treatment Provided   Treatment provided  Cognitive-Linquistic      Cognitive-Linquistic Treatment   Treatment focused on  Cognition    Skilled Treatment  Divided attention in complex card sort with conversations with rare min A. After pt trained to complex card sort, pt divided attention between sort and naming 5-6 letter words spelled aloud by ST with rare min A - 90% on card sort, pt with rare requests for repetition of words spelled - 90% accuracy. Instructed pt on environmental compensations, limiting distractions, using check lists/check offs and asking for clarification of complex instructions upon return to work.       Assessment / Recommendations / Plan   Plan  Continue with current plan of care      Progression Toward Goals   Progression toward goals  Progressing toward goals           SLP Long Term Goals - 02/06/18 1255      SLP  LONG TERM GOAL #1   Title  Pt will perform mildly complex working memory tasks with 85% accuracy and rare min A    Time  5    Period  Weeks    Status  Achieved      SLP LONG TERM GOAL #2   Title  Pt will divide attention between 2 milldy complex cognitive linguistic tasks with 85% on each and rare min A over 2 sessions.    Baseline  02/05/18; 02/07/18    Time  5    Period  Weeks    Status  Achieved      SLP LONG TERM GOAL #3   Title  Pt will self correct  errors on moderately complex cognitive linguistic tasks with rare min A over 2 sessions    Baseline  02/05/18; 02/07/18    Time  5    Period  Weeks    Status  Achieved      SLP LONG TERM GOAL #4   Title  Pt will utilize external aids for short term recall with rare min A  over 2 sessions.    Baseline  02/05/18;     Time  5    Period  Weeks    Status  On-going       Plan - 02/06/18 1254    Clinical Impression Statement  Pt completing moderately complex divided attention tasks with mod I to rare min A. Pt reporting success with memory strategies. Continue skilled ST to maximize safety and success for return to work. Likely 2-3 more visits    Speech Therapy Frequency  2x / week    Treatment/Interventions  Environmental controls;Cueing hierarchy;SLP instruction and feedback;Compensatory techniques;Cognitive reorganization;Functional tasks;Internal/external aids;Patient/family education    Potential to Achieve Goals  Good       Patient will benefit from skilled therapeutic intervention in order to improve the following deficits and impairments:   Cognitive communication deficit    Problem List Patient Active Problem List   Diagnosis Date Noted  . Cerebrovascular accident (CVA) ([place])   . Hyperlipidemia 01/20/2018  . BPH (benign prostatic hyperplasia) 01/20/2018  . COPD (chronic obstructive pulmonary disease) ([place]) 01/20/2018  . Cerebral infarction due to embolism of left middle cerebral artery ([place]) s/p tPA 01/19/2018  . Suspected stroke patient last known to be well 2 to 3 hours ago 01/18/2018  . Primary osteoarthritis of right knee 05/30/2016  . Right knee DJD 05/30/2016  . GERD (gastroesophageal reflux disease) 07/28/2014  . Left knee DJD 07/21/2014  . Osteoarthritis 10/09/2009  . Asthma 08/17/2008  . SINUSITIS 09/15/2007  . Essential hypertension 06/19/2007  . Allergic rhinitis 06/19/2007    Lovvorn, Annye Rusk MS, CCC-SLP 02/06/2018, 12:57 PM  [facility] [address] [place], [place], [address] Phone: [phone]   Fax:  [phone]   Name: Charles Mccoy MRN: [id number] Date of Birth: Feb 14, 1951

## 2018-02-10 ENCOUNTER — Encounter: Payer: Self-pay | Admitting: Internal Medicine

## 2018-02-12 ENCOUNTER — Ambulatory Visit: Payer: 59 | Admitting: Physical Therapy

## 2018-02-12 ENCOUNTER — Encounter: Payer: Self-pay | Admitting: Physical Therapy

## 2018-02-12 ENCOUNTER — Ambulatory Visit: Payer: 59 | Admitting: Occupational Therapy

## 2018-02-12 ENCOUNTER — Encounter: Payer: Self-pay | Admitting: Speech Pathology

## 2018-02-12 ENCOUNTER — Ambulatory Visit: Payer: 59 | Admitting: Speech Pathology

## 2018-02-12 DIAGNOSIS — M6281 Muscle weakness (generalized): Secondary | ICD-10-CM | POA: Diagnosis not present

## 2018-02-12 DIAGNOSIS — I69353 Hemiplegia and hemiparesis following cerebral infarction affecting right non-dominant side: Secondary | ICD-10-CM

## 2018-02-12 DIAGNOSIS — R208 Other disturbances of skin sensation: Secondary | ICD-10-CM

## 2018-02-12 DIAGNOSIS — R262 Difficulty in walking, not elsewhere classified: Secondary | ICD-10-CM

## 2018-02-12 DIAGNOSIS — R278 Other lack of coordination: Secondary | ICD-10-CM

## 2018-02-12 DIAGNOSIS — R41841 Cognitive communication deficit: Secondary | ICD-10-CM

## 2018-02-12 NOTE — Patient Instructions (Signed)
  Pay attention to your body - if you have more fatigue you attention/focus may be affected at work  Use strategies such as note taking, checking off, making lists and repeating instructions back to others to make sure you attended to all of the information  Give yourself plenty of time to complete your job

## 2018-02-12 NOTE — Therapy (Signed)
[facility] [address] [place], [place], [address] Phone: [phone]   Fax:  [phone]  Speech Language Pathology Treatment  Patient Details  Name: Charles Mccoy MRN: [id number] Date of Birth: Aug 15, 1951 Referring Provider: Dr. Antony Contras   Encounter Date: 02/12/2018  End of Session - 02/12/18 1135    Visit Number  6    Number of Visits  13    Date for SLP Re-Evaluation  03/06/18    SLP Start Time  0847    SLP Stop Time   0930    SLP Time Calculation (min)  43 min    Activity Tolerance  Patient tolerated treatment well       Past Medical History:  Diagnosis Date  . ALLERGIC RHINITIS 06/19/2007  . ASTHMA 08/17/2008  . Back pain 05/2016  . COPD (chronic obstructive pulmonary disease) ([place])   . GERD (gastroesophageal reflux disease)   . HYPERTENSION 06/19/2007  . Hypertension    meds controlling well  . OSTEOARTHRITIS 10/09/2009   wrists, knee.  . Pneumonia    hx of   . SINUSITIS 09/15/2007   recent issuses 2 weeks ago-clearing.    Past Surgical History:  Procedure Laterality Date  . cyst removed from neck      age 67   . KNEE ARTHROSCOPY     2 on left knee , 1 on right  . LOOP RECORDER INSERTION N/A 01/28/2018   Procedure: LOOP RECORDER INSERTION;  Surgeon: Evans Lance, MD;  Location: [facility];  Service: Cardiovascular;  Laterality: N/A;  . TEE WITHOUT CARDIOVERSION N/A 01/28/2018   Procedure: TRANSESOPHAGEAL ECHOCARDIOGRAM (TEE);  Surgeon: Acie Fredrickson Wonda Cheng, MD;  Location: [facility] ENDOSCOPY;  Service: Cardiovascular;  Laterality: N/A;  . TOTAL KNEE ARTHROPLASTY Left 07/21/2014   Procedure: LEFT TOTAL KNEE ARTHROPLASTY;  Surgeon: Johnn Hai, MD;  Location: [facility];  Service: Orthopedics;  Laterality: Left;  . TOTAL KNEE ARTHROPLASTY Right 05/30/2016   Procedure: RIGHT TOTAL KNEE ARTHROPLASTY REPLACEMENT;  Surgeon: Susa Day, MD;  Location: [facility];  Service: Orthopedics;  Laterality: Right;     There were no vitals filed for this visit.  Subjective Assessment - 02/12/18 0853    Subjective  "this one didn't have a lot of information - I had to think a lot"    Currently in Pain?  No/denies            ADULT SLP TREATMENT - 02/12/18 0854      General Information   Behavior/Cognition  Alert;Cooperative;Pleasant mood      Treatment Provided   Treatment provided  Cognitive-Linquistic      Cognitive-Linquistic Treatment   Treatment focused on  Cognition    Skilled Treatment  Divided attention facilitated participating in conversation and complex deduction task with rare min A and 95% accuracy.  Pt verbalized strategies to assist short term recall at work and home. He reports using note taking, list making and checking off and plans to carry this over at work.       Assessment / Recommendations / Plan   Plan  All goals met      Progression Toward Goals   Progression toward goals  Goals met, education completed, patient discharged from SLP       SLP Education - 02/12/18 1134    Education provided  Yes    Education Details  compensations for attention upon return to work    [employer]) Educated  Patient    Methods  Explanation;Demonstration;Handout    Comprehension  Verbalized understanding;Returned demonstration      SPEECH THERAPY DISCHARGE SUMMARY  Visits from Start of Care: 6  Current functional level related to goals / functional outcomes: See goals below   Remaining deficits: Possible reduced attention/memory with fatigue   Education / Equipment: Compensations for attention/memory Plan: Patient agrees to discharge.  Patient goals were met. Patient is being discharged due to meeting the stated rehab goals.  ?????         SLP Long Term Goals - 02/12/18 1135      SLP LONG TERM GOAL #1   Title  Pt will perform mildly complex working memory tasks with 85% accuracy and rare min A    Time  5    Period  Weeks    Status  Achieved      SLP LONG  TERM GOAL #2   Title  Pt will divide attention between 2 milldy complex cognitive linguistic tasks with 85% on each and rare min A over 2 sessions.    Baseline  02/05/18; 02/07/18    Time  5    Period  Weeks    Status  Achieved      SLP LONG TERM GOAL #3   Title  Pt will self correct  errors on moderately complex cognitive linguistic tasks with rare min A over 2 sessions    Baseline  02/05/18; 02/07/18    Time  5    Period  Weeks    Status  Achieved      SLP LONG TERM GOAL #4   Title  Pt will utilize external aids for short term recall with rare min A over 2 sessions.    Baseline  02/05/18;     Time  5    Period  Weeks    Status  Achieved       Plan - 02/12/18 1134    Clinical Impression Statement  Pt has met all goals. Education complete. Pt plans on returning to work in a week. D/C ST at this time - pt in agreement    Speech Therapy Frequency  2x / week    Treatment/Interventions  Environmental controls;Cueing hierarchy;SLP instruction and feedback;Compensatory techniques;Cognitive reorganization;Functional tasks;Internal/external aids;Patient/family education    Potential to Achieve Goals  Good    Consulted and Agree with Plan of Care  Patient       Patient will benefit from skilled therapeutic intervention in order to improve the following deficits and impairments:   Cognitive communication deficit    Problem List Patient Active Problem List   Diagnosis Date Noted  . Cerebrovascular accident (CVA) ([place])   . Hyperlipidemia 01/20/2018  . BPH (benign prostatic hyperplasia) 01/20/2018  . COPD (chronic obstructive pulmonary disease) ([place]) 01/20/2018  . Cerebral infarction due to embolism of left middle cerebral artery ([place]) s/p tPA 01/19/2018  . Suspected stroke patient last known to be well 2 to 3 hours ago 01/18/2018  . Primary osteoarthritis of right knee 05/30/2016  . Right knee DJD 05/30/2016  . GERD (gastroesophageal reflux disease) 07/28/2014  . Left knee DJD 07/21/2014   . Osteoarthritis 10/09/2009  . Asthma 08/17/2008  . SINUSITIS 09/15/2007  . Essential hypertension 06/19/2007  . Allergic rhinitis 06/19/2007    Lovvorn, Annye Rusk MS, CCC-SLP 02/12/2018, 11:36 AM  [facility] [address] [place], [place], [address] Phone: [phone]   Fax:  [phone]   Name: Lynnwood [place] MRN: [id number] Date of Birth: January 27, 1951

## 2018-02-12 NOTE — Therapy (Signed)
[facility] [address] [place], [place], [address] Phone: [phone]   Fax:  [phone]  Occupational Therapy Treatment  Patient Details  Name: Charles Mccoy MRN: [id number] Date of Birth: 1951/05/19 Referring Provider: Antony Contras   Encounter Date: 02/12/2018  OT End of Session - 02/12/18 1152    Visit Number  6    Number of Visits  9    Date for OT Re-Evaluation  02/21/18    Authorization Type  [employer]     OT Start Time  1100    OT Stop Time  1145    OT Time Calculation (min)  45 min    Activity Tolerance  Patient tolerated treatment well    Behavior During Therapy  [facility] for tasks assessed/performed       Past Medical History:  Diagnosis Date  . ALLERGIC RHINITIS 06/19/2007  . ASTHMA 08/17/2008  . Back pain 05/2016  . COPD (chronic obstructive pulmonary disease) ([place])   . GERD (gastroesophageal reflux disease)   . HYPERTENSION 06/19/2007  . Hypertension    meds controlling well  . OSTEOARTHRITIS 10/09/2009   wrists, knee.  . Pneumonia    hx of   . SINUSITIS 09/15/2007   recent issuses 2 weeks ago-clearing.    Past Surgical History:  Procedure Laterality Date  . cyst removed from neck      age 11   . KNEE ARTHROSCOPY     2 on left knee , 1 on right  . LOOP RECORDER INSERTION N/A 01/28/2018   Procedure: LOOP RECORDER INSERTION;  Surgeon: Evans Lance, MD;  Location: [place] [facility];  Service: Cardiovascular;  Laterality: N/A;  . TEE WITHOUT CARDIOVERSION N/A 01/28/2018   Procedure: TRANSESOPHAGEAL ECHOCARDIOGRAM (TEE);  Surgeon: Acie Fredrickson Wonda Cheng, MD;  Location: [facility] ENDOSCOPY;  Service: Cardiovascular;  Laterality: N/A;  . TOTAL KNEE ARTHROPLASTY Left 07/21/2014   Procedure: LEFT TOTAL KNEE ARTHROPLASTY;  Surgeon: Johnn Hai, MD;  Location: [facility];  Service: Orthopedics;  Laterality: Left;  . TOTAL KNEE ARTHROPLASTY Right 05/30/2016   Procedure: RIGHT TOTAL KNEE ARTHROPLASTY REPLACEMENT;  Surgeon:  Susa Day, MD;  Location: [facility];  Service: Orthopedics;  Laterality: Right;    There were no vitals filed for this visit.  Subjective Assessment - 02/12/18 1117    Subjective   I'm doing all my ex's    Pertinent History  Lt CVA 01/18/18 w/ mild RT hemiparesis - received tPA (PMH: HTN, COPD, OA, Bilateral TKA, Rt CTS, old Lt shoulder fx)     Limitations  loop recorder placed on 01/28/18    Patient Stated Goals  Get back to work     Currently in Pain?  No/denies                   OT Treatments/Exercises (OP) - 02/12/18 0001      ADLs   Functional Mobility  Pt practiced lifting/carrying 30# box, 40# box, then 50# box. Pt practiced carrying ladder, climbing up/down ladder, and performing high level BUE coordination tasks while on ladder with 2 lb. wrist weights on each UE    ADL Comments  Assessed/addressed all LTG's - see goal section.                   OT Long Term Goals - 02/12/18 1118      OT LONG TERM GOAL #1   Title  Independent with HEP for Rt hand coordination, hand and shoulder strength  Time  4    Period  Weeks    Status  Achieved      OT LONG TERM GOAL #2   Title  Improve grip strength Rt hand to 75 lbs or greater    Baseline  65 lbs (Lt = 99 lbs)     Time  4    Period  Weeks    Status  Achieved 75 lbs      OT LONG TERM GOAL #3   Title  Pt will demonstrate ability to perform simulated work activities including climbing up/down ladders, carrying ladders, and performing overhead coordination activities while on ladder    Time  4    Period  Weeks    Status  Achieved      OT LONG TERM GOAL #4   Title  Pt will be able to lift and carry up to 50 lbs for work related tasks    Time  4    Period  Weeks    Status  Achieved      OT LONG TERM GOAL #5   Title  Pt will improve coordination as evidenced by performing 9 hole peg test in 27 sec. or less Rt hand    Baseline  30.65 sec. (Lt = 25.13 sec)     Time  4    Period  Weeks    Status   Achieved 26.10 sec            Plan - 02/12/18 1153    Clinical Impression Statement  Pt met all LTG's. Pt has no further O.T. concerns    Occupational Profile and client history currently impacting functional performance  PMH: HTN, COPD, OA, Bil. TKA, Lt shoulder fx    Rehab Potential  Excellent    Current Impairments/barriers affecting progress:  loop recorder placed on 01/28/18,    OT Frequency  2x / week    OT Duration  4 weeks    OT Treatment/Interventions  Moist Heat;Self-care/ADL training;Therapeutic activities;Therapeutic exercise;Neuromuscular education;Functional Mobility Training;Passive range of motion;Energy conservation;Manual Therapy;Patient/family education;Cognitive remediation/compensation;Other (comment)    Plan  D/C OT    Consulted and Agree with Plan of Care  Patient       Patient will benefit from skilled therapeutic intervention in order to improve the following deficits and impairments:  Decreased coordination, Improper body mechanics, Decreased endurance, Impaired sensation, Decreased activity tolerance, Impaired UE functional use, Decreased strength  Visit Diagnosis: Hemiplegia and hemiparesis following cerebral infarction affecting right non-dominant side (HCC)  Muscle weakness (generalized)  Other lack of coordination    Problem List Patient Active Problem List   Diagnosis Date Noted  . Cerebrovascular accident (CVA) ([place])   . Hyperlipidemia 01/20/2018  . BPH (benign prostatic hyperplasia) 01/20/2018  . COPD (chronic obstructive pulmonary disease) ([place]) 01/20/2018  . Cerebral infarction due to embolism of left middle cerebral artery ([place]) s/p tPA 01/19/2018  . Suspected stroke patient last known to be well 2 to 3 hours ago 01/18/2018  . Primary osteoarthritis of right knee 05/30/2016  . Right knee DJD 05/30/2016  . GERD (gastroesophageal reflux disease) 07/28/2014  . Left knee DJD 07/21/2014  . Osteoarthritis 10/09/2009  . Asthma 08/17/2008  .  SINUSITIS 09/15/2007  . Essential hypertension 06/19/2007  . Allergic rhinitis 06/19/2007    OCCUPATIONAL THERAPY DISCHARGE SUMMARY  Visits from Start of Care: 6  Current functional level related to goals / functional outcomes: SEE ABOVE   Remaining deficits: None   Education / Equipment: HEP's  Plan: Patient agrees  to discharge.  Patient goals were met. Patient is being discharged due to meeting the stated rehab goals.  ?????       Carey Bullocks, OTR/L 02/12/2018, 11:54 AM  [facility] [address] [place], [place], [address] Phone: [phone]   Fax:  [phone]  Name: Charles Mccoy MRN: [id number] Date of Birth: 1951-02-16

## 2018-02-13 ENCOUNTER — Ambulatory Visit: Payer: 59 | Admitting: Occupational Therapy

## 2018-02-13 ENCOUNTER — Ambulatory Visit: Payer: 59 | Admitting: Speech Pathology

## 2018-02-13 NOTE — Therapy (Addendum)
[facility] [address] [place], [place], [address] Phone: [phone]   Fax:  [phone]  Physical Therapy Treatment and D/C Summary  Patient Details  Name: Charles Mccoy MRN: [id number] Date of Birth: [birth year] Referring Provider: Antony Contras   Encounter Date: 02/12/2018  PT End of Session - 02/12/18 1023    Visit Number  4    Number of Visits  5    Date for PT Re-Evaluation  02/21/18    Authorization Type  AETNA - $65 copay each clinic visit (even if he sees three disciplines); 60 VL combined PT, OT, SLP    Authorization - Visit Number  6    Authorization - Number of Visits  60    PT Start Time  1020    PT Stop Time  1100    PT Time Calculation (min)  40 min    Equipment Utilized During Treatment  Gait belt    Activity Tolerance  Patient tolerated treatment well    Behavior During Therapy  [facility] for tasks assessed/performed       Past Medical History:  Diagnosis Date  . ALLERGIC RHINITIS 06/19/2007  . ASTHMA 08/17/2008  . Back pain 05/2016  . COPD (chronic obstructive pulmonary disease) ([place])   . GERD (gastroesophageal reflux disease)   . HYPERTENSION 06/19/2007  . Hypertension    meds controlling well  . OSTEOARTHRITIS 10/09/2009   wrists, knee.  . Pneumonia    hx of   . SINUSITIS 09/15/2007   recent issuses 2 weeks ago-clearing.    Past Surgical History:  Procedure Laterality Date  . cyst removed from neck      age 28   . KNEE ARTHROSCOPY     2 on left knee , 1 on right  . LOOP RECORDER INSERTION N/A 01/28/2018   Procedure: LOOP RECORDER INSERTION;  Surgeon: Evans Lance, MD;  Location: [facility];  Service: Cardiovascular;  Laterality: N/A;  . TEE WITHOUT CARDIOVERSION N/A 01/28/2018   Procedure: TRANSESOPHAGEAL ECHOCARDIOGRAM (TEE);  Surgeon: Acie Fredrickson Wonda Cheng, MD;  Location: [facility] ENDOSCOPY;  Service: Cardiovascular;  Laterality: N/A;  . TOTAL KNEE ARTHROPLASTY Left 07/21/2014   Procedure:  LEFT TOTAL KNEE ARTHROPLASTY;  Surgeon: Johnn Hai, MD;  Location: [facility];  Service: Orthopedics;  Laterality: Left;  . TOTAL KNEE ARTHROPLASTY Right 05/30/2016   Procedure: RIGHT TOTAL KNEE ARTHROPLASTY REPLACEMENT;  Surgeon: Susa Day, MD;  Location: [facility];  Service: Orthopedics;  Laterality: Right;    There were no vitals filed for this visit.  Subjective Assessment - 02/12/18 1021    Subjective  No new complaints except he did sprain his wrist while power washing yesterday.     Pertinent History  COPD, back pain, HTN, bilat TKR, and OA    Patient Stated Goals  Get more strength in R hand    Currently in Pain?  Yes    Pain Score  1     Pain Location  Wrist    Pain Orientation  Left    Pain Descriptors / Indicators  -- sprain    Pain Type  Acute pain    Pain Onset  Yesterday    Pain Frequency  Intermittent    Aggravating Factors   movement    Pain Relieving Factors  rest         [facility] PT Assessment - 02/12/18 1027      Ambulation/Gait   Ambulation/Gait  Yes    Ambulation/Gait Assistance  7:  Independent    Ambulation Distance (Feet)  1000 Feet x1 in/outdoors; plus 6 minute and other activities    Assistive device  None    Gait Pattern  Within Functional Limits    Ambulation Surface  Level;Unlevel;Indoor;Outdoor;Paved;Gravel;Grass    Stairs  Yes    Stairs Assistance  7: Independent    Stair Management Technique  No rails;Alternating pattern;Forwards    Number of Stairs  12    Height of Stairs  6      6 Minute Walk- Baseline   6 Minute Walk- Baseline  yes    BP (mmHg)  146/89    HR (bpm)  68    02 Sat (%RA)  95 %    Modified Borg Scale for Dyspnea  0- Nothing at all    Perceived Rate of Exertion (Borg)  6-      6 Minute walk- Post Test   6 Minute Walk Post Test  yes    BP (mmHg)  152/88    HR (bpm)  75    02 Sat (%RA)  96 %    Modified Borg Scale for Dyspnea  0- Nothing at all    Perceived Rate of Exertion (Borg)  6-      6 minute walk test results     Aerobic Endurance Distance Walked  1651    Endurance additional comments  no AD, no imbalance      Standardized Balance Assessment   Standardized Balance Assessment  Five Times Sit to Stand;10 meter walk test    Five times sit to stand comments   7.19 seconds using standard height chair and no UE support    10 Meter Walk  6.78 sec's= 4.83 ft/sec      Functional Gait  Assessment   Gait assessed   Yes    Gait Level Surface  Walks 20 ft in less than 5.5 sec, no assistive devices, good speed, no evidence for imbalance, normal gait pattern, deviates no more than 6 in outside of the 12 in walkway width.    Change in Gait Speed  Able to smoothly change walking speed without loss of balance or gait deviation. Deviate no more than 6 in outside of the 12 in walkway width.    Gait with Horizontal Head Turns  Performs head turns smoothly with no change in gait. Deviates no more than 6 in outside 12 in walkway width    Gait with Vertical Head Turns  Performs head turns with no change in gait. Deviates no more than 6 in outside 12 in walkway width.    Gait and Pivot Turn  Pivot turns safely within 3 sec and stops quickly with no loss of balance.    Step Over Obstacle  Is able to step over 2 stacked shoe boxes taped together (9 in total height) without changing gait speed. No evidence of imbalance.    Gait with Narrow Base of Support  Is able to ambulate for 10 steps heel to toe with no staggering.    Gait with Eyes Closed  Walks 20 ft, no assistive devices, good speed, no evidence of imbalance, normal gait pattern, deviates no more than 6 in outside 12 in walkway width. Ambulates 20 ft in less than 7 sec.    Ambulating Backwards  Walks 20 ft, no assistive devices, good speed, no evidence for imbalance, normal gait    Steps  Alternating feet, no rail.    Total Score  30    FGA comment:  30/30            PT Short Term Goals - 01/22/18 2054      PT SHORT TERM GOAL #1   Title  = LTG        PT  Long Term Goals - 02/12/18 1023      PT LONG TERM GOAL #1   Title  Pt will demonstrate independence with balance HEP and walking program    Baseline  02/12/18: met today    Time  --    Period  --    Status  Achieved      PT LONG TERM GOAL #2   Title  Pt will improve LE strength as indicated by decrease in five time sit to stand by 4 seconds    Baseline  02/12/18: 7.19 sec's no UE assist    Time  --    Period  --    Status  Achieved      PT LONG TERM GOAL #3   Title  Pt will improve FGA by 4 points    Baseline  02/12/18: 30/30 scored today    Time  --    Period  --    Status  Achieved      PT LONG TERM GOAL #4   Title  Pt will improve functional endurance with improvement in 6 min walk test by 200'    Baseline  02/12/18: 1651 feet today with no AD    Time  --    Period  --    Status  Achieved      PT LONG TERM GOAL #5   Title  Pt will ambulate x 1000 over uneven, outdoor terrain (grass, mulch, gravel) and negotiate 12 stairs, alternating sequence, no rail independently without LOB    Baseline  02/12/18: met today    Time  --    Period  --    Status  Achieved      PT LONG TERM GOAL #6   Title  Pt will improve overall function as indicated by FOTO to >/= 90%    Baseline  02/12/18: not captured to date. survey has been emailed    Time  --    Period  --    Status  Deferred      PT LONG TERM GOAL #7   Title  Pt will demonstrate ability to perform work simulated activities (walk with heavy backpack, pull heavy duffel, climb, pulling cables, lift heavy loads -up to 50lb, etc) independently    Baseline  02/12/18: met with previous visits    Time  --    Period  --    Status  Achieved            Plan - 02/12/18 1023    Clinical Impression Statement  Today's skilled session focused on checking LTGs with all met except for FOTO goal- survey has been emailed to pt to complete. Pt is agreeable to discharge today.     Rehab Potential  Excellent    PT Frequency  1x / week    PT  Duration  4 weeks    PT Treatment/Interventions  ADLs/Self Care Home Management;Electrical Stimulation;Gait training;Stair training;Functional mobility training;Therapeutic activities;Therapeutic exercise;Balance training;Neuromuscular re-education;Patient/family education    PT Next Visit Plan  discharge today    PT Home Exercise Plan  work related activities: pulling cables, carrying heavy back pack, lifting atleast 50 lb, pulling heavy duffel bag for travel    Consulted and Agree with Plan of Care  Patient       Patient will benefit from skilled therapeutic intervention in order to improve the following deficits and impairments:  Decreased balance, Decreased endurance, Decreased strength, Difficulty walking, Impaired sensation, Impaired UE functional use  Visit Diagnosis: Hemiplegia and hemiparesis following cerebral infarction affecting right non-dominant side (HCC)  Muscle weakness (generalized)  Difficulty in walking, not elsewhere classified  Other disturbances of skin sensation     Problem List Patient Active Problem List   Diagnosis Date Noted  . Cerebrovascular accident (CVA) ([place])   . Hyperlipidemia 01/20/2018  . BPH (benign prostatic hyperplasia) 01/20/2018  . COPD (chronic obstructive pulmonary disease) ([place]) 01/20/2018  . Cerebral infarction due to embolism of left middle cerebral artery ([place]) s/p tPA 01/19/2018  . Suspected stroke patient last known to be well 2 to 3 hours ago 01/18/2018  . Primary osteoarthritis of right knee 05/30/2016  . Right knee DJD 05/30/2016  . GERD (gastroesophageal reflux disease) 07/28/2014  . Left knee DJD 07/21/2014  . Osteoarthritis 10/09/2009  . Asthma 08/17/2008  . SINUSITIS 09/15/2007  . Essential hypertension 06/19/2007  . Allergic rhinitis 06/19/2007    Willow Ora, PTA, [facility] [address] [place], [place] [address] [phone] 02/13/18, 2:42 PM   D/C Summary: PHYSICAL THERAPY  DISCHARGE SUMMARY  Visits from Start of Care: 4  Current functional level related to goals / functional outcomes: Excellent progress; met all LTG   Remaining deficits: N/A    Education / Equipment: HEP  Plan: Patient agrees to discharge.  Patient goals were met. Patient is being discharged due to meeting the stated rehab goals.  ?????     Rico Junker, PT, DPT 08/06/18    9:43 AM    Name: Charles Mccoy MRN: [id number] Date of Birth: June 21, 1951

## 2018-02-16 ENCOUNTER — Telehealth: Payer: Self-pay | Admitting: Cardiology

## 2018-02-16 NOTE — Telephone Encounter (Signed)
Pt wife called wanting to know if pt needed his monitor while he is out of town working. Pt will be back on Sunday 02-22-2018. Informed pt wife that our recommendations is anything 7 days or less he can leave the monitor at home anything more than 7 days we recommend that he take with him. Pt wife verbalized understanding.

## 2018-02-17 ENCOUNTER — Encounter: Payer: Self-pay | Admitting: Occupational Therapy

## 2018-02-19 ENCOUNTER — Ambulatory Visit: Payer: Self-pay | Admitting: Physical Therapy

## 2018-02-19 ENCOUNTER — Encounter: Payer: Self-pay | Admitting: Occupational Therapy

## 2018-02-23 ENCOUNTER — Telehealth: Payer: Self-pay | Admitting: *Deleted

## 2018-02-23 ENCOUNTER — Encounter: Payer: Self-pay | Admitting: Adult Health

## 2018-02-23 ENCOUNTER — Ambulatory Visit: Payer: 59 | Admitting: Adult Health

## 2018-02-23 VITALS — BP 126/71 | HR 73 | Ht 76.5 in | Wt 245.2 lb

## 2018-02-23 DIAGNOSIS — I10 Essential (primary) hypertension: Secondary | ICD-10-CM | POA: Diagnosis not present

## 2018-02-23 DIAGNOSIS — E785 Hyperlipidemia, unspecified: Secondary | ICD-10-CM

## 2018-02-23 DIAGNOSIS — I63412 Cerebral infarction due to embolism of left middle cerebral artery: Secondary | ICD-10-CM | POA: Diagnosis not present

## 2018-02-23 MED ORDER — ATORVASTATIN CALCIUM 40 MG PO TABS: 40.0000 mg | ORAL_TABLET | Freq: Every day | ORAL | 3 refills | Status: DC

## 2018-02-23 MED ORDER — ASPIRIN 81 MG PO TBEC: 81.0000 mg | DELAYED_RELEASE_TABLET | Freq: Every day | ORAL | 3 refills | Status: DC

## 2018-02-23 NOTE — Patient Instructions (Addendum)
Continue aspirin 81 mg daily  and lipitor  for secondary stroke prevention  Stop plavix as you have had both aspirin and plavix for 3 weeks now - continue aspirin only  Continue to follow up with PCP regarding cholesterol and blood pressure management   Will continue to monitor loop recorder and we will call you if we find atrial fibrillation  Maintain strict control of hypertension with blood pressure goal below 130/90, diabetes with hemoglobin A1c goal below 6.5% and cholesterol with LDL cholesterol (bad cholesterol) goal below 70 mg/dL. I also advised the patient to eat a healthy diet with plenty of whole grains, cereals, fruits and vegetables, exercise regularly and maintain ideal body weight.  Followup in the future with me in 3 months or call earlier if needed         Thank you for coming to see [place] at [facility]. I hope we have been able to provide you high quality care today.  You may receive a patient satisfaction survey over the next few weeks. We would appreciate your feedback and comments so that we may continue to improve ourselves and the health of our patients.

## 2018-02-23 NOTE — Telephone Encounter (Signed)
LMOM requesting call back to the [facility].  Patient has had 4 nocturnal pauses noted on LINQ, most recent on 02/18/18 at 0102, duration 4sec.  Will determine typical hours that patient is asleep for future reference.  Pause and brady alerts enabled in [place].

## 2018-02-23 NOTE — Progress Notes (Signed)
I have read the note, and I agree with the clinical assessment and plan.  Richard A. Sater, MD, PhD, FAAN Certified in Neurology, Clinical Neurophysiology, Sleep Medicine, Pain Medicine and Neuroimaging  [facility] [address], [address] [place], [place] [address] [phone]  

## 2018-02-23 NOTE — Progress Notes (Signed)
[facility] [address] [place]. [place] [address] [phone]       OFFICE FOLLOW UP NOTE  [name] Date of Birth:  08-Jan-1951 Medical Record Number:  [id number]   Reason for Referral:  hospital stroke follow up  CHIEF COMPLAINT:  Chief Complaint  Patient presents with  . Follow-up    hospital follow up. Patient reports that he has been doing very well.     HPI: [name] is being seen today for initial visit in the office for left MCA infarct on 01/18/18. History obtained from patient, wife and chart review. Reviewed all radiology images and labs personally.  Jaquese Smithis an 67 y.o.malewith PMH of HTN, HLD presents to [facility] ER as a stroke alert after watching TV around 8.15 pm on 01/18/18 and noticed his right arm felt numb and when he got up he noticed it was not moving along with slurred speech.Tele stroke neurology was consulted and he received IV TPA with mild right hemiparesis.  [facility] was transferred to [facility] for post TPA care and further evaluation and treatment.  CT head showed no acute stroke or hemorrhage.  MRI brain reviewed and showed scattered small left frontal and parietal infarcts along with small vessel disease and atrophy which was moderately advanced for his age.  MRA was normal.  Carotid Doppler showed bilateral ICA stenosis of 1 to 39% along with the VAs antegrade.  Etiology embolic infarct secondary to unknown source.  TEE was requested unable to do for 2 days due to full schedule and was discussed with cardiology at discharge to schedule this is outpatient.  Recommended a TEE shows a PFO to do a lower extremity Doppler to look for possible DVT as source of stroke.  LDL 113 and A1c 5.4.  Patient was started on Lipitor 40 mg.  Not on antithrombotic PTA and recommended DAPT with aspirin and Plavix for 3 weeks and then aspirin alone.  Therapy recommended outpatient PT/OT and was discharged home in stable condition with wife.  Patient did  undergo TEE on 01/28/2018 which did show EF of 60 to 65% without PFO or cardiac source of embolism.  Loop recorder was placed at that time.  Since discharge, patient has been doing well and is accompanied at his follow-up visit by his wife.  Denies residual side effects from stroke and has graduated from PT/OT/ST.  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  He has continued taking aspirin and Plavix without side effects of bleeding or bruising.  Continues to take Lipitor without side effects of myalgias.  Blood pressure today satisfactory at 126/71.  He states he has been keeping active and trying to eat a healthier diet.  Loop recorder has not shown atrial fibrillation thus far.  Denies new or worsening stroke/TIA symptoms at this time.   ROS:   14 system review of systems performed and negative with exception of environmental allergies  PMH:  Past Medical History:  Diagnosis Date  . ALLERGIC RHINITIS 06/19/2007  . ASTHMA 08/17/2008  . Back pain 05/2016  . COPD (chronic obstructive pulmonary disease) ([place])   . GERD (gastroesophageal reflux disease)   . HYPERTENSION 06/19/2007  . Hypertension    meds controlling well  . OSTEOARTHRITIS 10/09/2009   wrists, knee.  . Pneumonia    hx of   . SINUSITIS 09/15/2007   recent issuses 2 weeks ago-clearing.    PSH:  Past Surgical History:  Procedure Laterality Date  . cyst removed from neck      age 67   . KNEE ARTHROSCOPY     2 on left knee , 1 on right  . LOOP RECORDER INSERTION N/A 01/28/2018   Procedure: LOOP RECORDER INSERTION;  Surgeon: Evans Lance, MD;  Location: [place] [facility];  Service: Cardiovascular;  Laterality: N/A;  . TEE WITHOUT CARDIOVERSION N/A 01/28/2018   Procedure: TRANSESOPHAGEAL ECHOCARDIOGRAM (TEE);  Surgeon: Acie Fredrickson Wonda Cheng, MD;  Location: [facility] ENDOSCOPY;  Service: Cardiovascular;  Laterality: N/A;  . TOTAL KNEE ARTHROPLASTY Left 07/21/2014   Procedure: LEFT  TOTAL KNEE ARTHROPLASTY;  Surgeon: Johnn Hai, MD;  Location: [facility];  Service: Orthopedics;  Laterality: Left;  . TOTAL KNEE ARTHROPLASTY Right 05/30/2016   Procedure: RIGHT TOTAL KNEE ARTHROPLASTY REPLACEMENT;  Surgeon: Susa Day, MD;  Location: [facility];  Service: Orthopedics;  Laterality: Right;    Social History:  Social History   Socioeconomic History  . Marital status: Married    Spouse name: Not on file  . Number of children: Not on file  . Years of education: Not on file  . Highest education level: Not on file  Occupational History  . Not on file  Social Needs  . Financial resource strain: Not on file  . Food insecurity:    Worry: Not on file    Inability: Not on file  . Transportation needs:    Medical: Not on file    Non-medical: Not on file  Tobacco Use  . Smoking status: Former Smoker    Types: Cigarettes    Last attempt to quit: 10/07/2001    Years since quitting: 16.3  . Smokeless tobacco: Never Used  Substance and Sexual Activity  . Alcohol use: Yes    Alcohol/week: 3.6 oz    Types: 6 Cans of beer per week    Comment: beer daily  . Drug use: No  . Sexual activity: Yes  Lifestyle  . Physical activity:    Days per week: Not on file    Minutes per session: Not on file  . Stress: Not on file  Relationships  . Social connections:    Talks on phone: Not on file    Gets together: Not on file    Attends religious service: Not on file    Active member of club or organization: Not on file    Attends meetings of clubs or organizations: Not on file    Relationship status: Not on file  . Intimate partner violence:    Fear of current or ex partner: Not on file    Emotionally abused: Not on file    Physically abused: Not on file    Forced sexual activity: Not on file  Other Topics Concern  . Not on file  Social History Narrative  . Not on file    Family History:  Family History  Problem Relation Age of Onset  . Colon cancer Neg Hx   . Esophageal  cancer Neg Hx   . Rectal cancer Neg Hx   . Stomach cancer Neg Hx     Medications:   Current Outpatient Medications on File Prior to Visit  Medication Sig Dispense Refill  . amLODipine (NORVASC) 10 MG tablet Take 1 tablet (10 mg total) by mouth every morning. 90 tablet 1  . aspirin EC 81 MG EC tablet Take 1 tablet (81 mg total) by mouth daily. 30 tablet prn  . atorvastatin (LIPITOR) 40 MG tablet Take 1  tablet (40 mg total) by mouth daily at 6 PM. 30 tablet 2  . azelastine (ASTELIN) 0.1 % nasal spray Place 1 spray into both nostrils at bedtime.  5  . clopidogrel (PLAVIX) 75 MG tablet Take 1 tablet (75 mg total) by mouth daily. 90 tablet 0  . Fluticasone-Salmeterol 232-14 MCG/ACT AEPB Inhale 1 puff into the lungs 2 (two) times daily.  5  . pantoprazole (PROTONIX) 40 MG tablet Take 1 tablet (40 mg total) by mouth daily. 90 tablet 3  . spironolactone (ALDACTONE) 25 MG tablet Take 1 tablet (25 mg total) by mouth daily. 90 tablet 1   No current facility-administered medications on file prior to visit.     Allergies:   Allergies  Allergen Reactions  . Erythromycin Base Other (See Comments)    REACTION: stomach irritation  . Penicillins Other (See Comments)    unknown     Physical Exam  Vitals:   02/23/18 1016  BP: 126/71  Pulse: 73  Weight: 245 lb 3.2 oz (111.2 kg)  Height: 6' 4.5" (1.943 m)   Body mass index is 29.46 kg/m. No exam data present  General: well developed, pleasant middle-aged Caucasian male, well nourished, seated, in no evident distress Head: head normocephalic and atraumatic.   Neck: supple with no carotid or supraclavicular bruits Cardiovascular: regular rate and rhythm, no murmurs Musculoskeletal: no deformity Skin:  no rash/petichiae Vascular:  Normal pulses all extremities  Neurologic Exam Mental Status: Awake and fully alert. Oriented to place and time. Recent and remote memory intact. Attention span, concentration and fund of knowledge appropriate.  Mood and affect appropriate.  Cranial Nerves: Fundoscopic exam reveals sharp disc margins. Pupils equal, briskly reactive to light. Extraocular movements full without nystagmus. Visual fields full to confrontation. Hearing intact. Facial sensation intact. Face, tongue, palate moves normally and symmetrically.  Motor: Normal bulk and tone. Normal strength in all tested extremity muscles. Sensory.: intact to touch , pinprick , position and vibratory sensation.  Coordination: Rapid alternating movements normal in all extremities. Finger-to-nose and heel-to-shin performed accurately bilaterally. Gait and Station: Arises from chair without difficulty. Stance is normal. Gait demonstrates normal stride length and balance . Able to heel, toe and tandem walk without difficulty.  Reflexes: 1+ and symmetric. Toes downgoing.    NIHSS  0 Modified Rankin  0   Diagnostic Data (Labs, Imaging, Testing)  CT HEAD WO CONTRAST 01/18/2018 IMPRESSION: 1. No acute finding.  ASPECTS is 10. 2. Moderate chronic small vessel ischemia in the cerebral white Matter.  MR BRAIN WO CONTRAST 01/19/2018 IMPRESSION: 1. Scattered small acute/subacute nonhemorrhagic cortical infarcts in the posterior left frontal lobe and anterior left parietal lobe near the vertex. These correspond with the patient's abrupt onset of right upper extremity weakness and numbness. 2. Subjacent white matter restricted diffusion corresponds with the cortical spinal tracts. 3. Atrophy and white matter disease in addition to these areas is moderately advanced for age. This likely reflects the sequela of chronic microvascular ischemia.  MR MRA HEAD WO CONTRAST 01/19/2018 IMPRESSION: Normal variant MRA circle of Willis without significant proximal stenosis, aneurysm, or branch vessel occlusion.  ECHOCARDIOGRAM 01/19/2018 Study Conclusions - Left ventricle: The cavity size was moderately dilated. Systolic   function was normal. The  estimated ejection fraction was in the   range of 60% to 65%. Wall motion was normal; there were no   regional wall motion abnormalities. Left ventricular diastolic   function parameters were normal. - Aortic valve: There was mild regurgitation. - Mitral valve:  Calcified annulus. - Left atrium: The atrium was mildly dilated. - Right ventricle: The cavity size was moderately dilated. Wall   thickness was normal. - Atrial septum: There was increased thickness of the septum,   consistent with lipomatous hypertrophy.  VAS US CAROTID DUPLEX BILATERAL 01/19/18 Final Interpretation: Right Carotid: Velocities in the right ICA are consistent with a 1-39% stenosis. Left Carotid: Velocities in the left ICA are consistent with a 1-39% stenosis. Vertebrals: Bilateral vertebral arteries demonstrate antegrade flow.  ECHO TEE 01/28/2018 Impressions: - Normal LV function   The ascending aorta was mildly dilated.   no PFO or ASD seen.   There was a late appearing bubble in the LA that was likely from   a pulmonary AVM     ASSESSMENT: Charles Mccoy is a 66 y.o. year old male here with left MCA infarcts on 01/18/18 secondary to possibly embolic with unknown source. Vascular risk factors include HLD and HTN.    PLAN: -Continue aspirin 81 mg daily  and Lipitor for secondary stroke prevention -Stop Plavix as patient has been on DAPT therapy for > 3 weeks -continue aspirin only -F/u with PCP regarding your HTN and HLD management -continue to monitor BP at home -Continue to monitor loop recorder -As patient travels frequently for work, it was advised that patient ensure he has a nearby hospital for possible recurrent stroke -Maintain strict control of hypertension with blood pressure goal below 130/90, diabetes with hemoglobin A1c goal below 6.5% and cholesterol with LDL cholesterol (bad cholesterol) goal below 70 mg/dL. I also advised the patient to eat a healthy diet with plenty of whole grains,  cereals, fruits and vegetables, exercise regularly and maintain ideal body weight.  Follow up in 3 months or call earlier if needed   Greater than 50% of time during this 25 minute visit was spent on counseling,explanation of diagnosis of left MCA, reviewing risk factor management of HLD and HTN, planning of further management, discussion with patient and family and coordination of care   Venancio Poisson, [facility]  [facility] [address] [place], [place] [address]  Phone [phone] Fax [phone]

## 2018-02-23 NOTE — Telephone Encounter (Signed)
Spoke with patient.  He was asymptomatic with nocturnal pause episodes.  He usually sleeps until 0530 when he is traveling for work and 0900-1000 when he is home.  Per patient, nocturnal bradycardia was noted while he was in the hospital.  Patient is aware that we will continue monitoring remotely and call him if any longer or daytime episodes are noted.  Patient agrees to call our office with any presyncope/syncope.  He is appreciative of call and denies additional questions or concerns at this time.

## 2018-02-27 ENCOUNTER — Ambulatory Visit (INDEPENDENT_AMBULATORY_CARE_PROVIDER_SITE_OTHER): Payer: 59 | Admitting: *Deleted

## 2018-02-27 DIAGNOSIS — I639 Cerebral infarction, unspecified: Secondary | ICD-10-CM

## 2018-02-27 NOTE — Progress Notes (Signed)
Carelink Summary Report / Loop Recorder 

## 2018-03-24 LAB — CUP PACEART REMOTE DEVICE CHECK
Implantable Pulse Generator Implant Date: 20190424
MDC IDC SESS DTM: 20190524144019

## 2018-03-26 ENCOUNTER — Other Ambulatory Visit: Payer: Self-pay | Admitting: Internal Medicine

## 2018-03-30 ENCOUNTER — Ambulatory Visit (INDEPENDENT_AMBULATORY_CARE_PROVIDER_SITE_OTHER): Payer: Medicare Other | Admitting: *Deleted

## 2018-03-30 DIAGNOSIS — I639 Cerebral infarction, unspecified: Secondary | ICD-10-CM | POA: Diagnosis not present

## 2018-04-01 NOTE — Progress Notes (Signed)
Carelink Summary Report / Loop Recorder 

## 2018-04-16 ENCOUNTER — Other Ambulatory Visit: Payer: Self-pay | Admitting: Internal Medicine

## 2018-04-20 ENCOUNTER — Other Ambulatory Visit: Payer: Self-pay

## 2018-04-21 ENCOUNTER — Ambulatory Visit (INDEPENDENT_AMBULATORY_CARE_PROVIDER_SITE_OTHER): Payer: Medicare Other | Admitting: Internal Medicine

## 2018-04-21 ENCOUNTER — Encounter: Payer: Self-pay | Admitting: Internal Medicine

## 2018-04-21 VITALS — BP 120/66 | HR 64 | Temp 98.2°F | Wt 248.6 lb

## 2018-04-21 DIAGNOSIS — I63412 Cerebral infarction due to embolism of left middle cerebral artery: Secondary | ICD-10-CM

## 2018-04-21 DIAGNOSIS — I639 Cerebral infarction, unspecified: Secondary | ICD-10-CM

## 2018-04-21 DIAGNOSIS — I10 Essential (primary) hypertension: Secondary | ICD-10-CM | POA: Diagnosis not present

## 2018-04-21 MED ORDER — PANTOPRAZOLE SODIUM 40 MG PO TBEC: 40.0000 mg | DELAYED_RELEASE_TABLET | Freq: Every day | ORAL | 3 refills | Status: DC

## 2018-04-21 MED ORDER — SPIRONOLACTONE 25 MG PO TABS: 25.0000 mg | ORAL_TABLET | Freq: Every day | ORAL | 4 refills | Status: DC

## 2018-04-21 MED ORDER — AMLODIPINE BESYLATE 10 MG PO TABS: ORAL_TABLET | ORAL | 4 refills | Status: DC

## 2018-04-21 NOTE — Patient Instructions (Signed)
Limit your sodium (Salt) intake    It is important that you exercise regularly, at least 20 minutes 3 to 4 times per week.  If you develop chest pain or shortness of breath seek  medical attention.  Please check your blood pressure on a regular basis.  If it is consistently greater than 140/90, please make an office appointment.  Return in 6 months for follow-up    

## 2018-04-21 NOTE — Progress Notes (Signed)
Subjective:    Patient ID: Charles Mccoy, male    DOB: [birth year], 67 y.o.   MRN: [id number]  HPI  67 year old patient who is followed by neurology following a left MCA infarct, status post TPA earlier this year.  Evaluation today has been negative.  The patient still has an ILR in place. He has essential hypertension and now is on statin therapy following his CVA. Doing quite well today.  Previously treated short-term with DAPT but now is on aspirin therapy only  Past Medical History:  Diagnosis Date  . ALLERGIC RHINITIS 06/19/2007  . ASTHMA 08/17/2008  . Back pain 05/2016  . COPD (chronic obstructive pulmonary disease) ([place])   . GERD (gastroesophageal reflux disease)   . HYPERTENSION 06/19/2007  . Hypertension    meds controlling well  . OSTEOARTHRITIS 10/09/2009   wrists, knee.  . Pneumonia    hx of   . SINUSITIS 09/15/2007   recent issuses 2 weeks ago-clearing.     Social History   Socioeconomic History  . Marital status: Married    Spouse name: Not on file  . Number of children: Not on file  . Years of education: Not on file  . Highest education level: Not on file  Occupational History  . Not on file  Social Needs  . Financial resource strain: Not on file  . Food insecurity:    Worry: Not on file    Inability: Not on file  . Transportation needs:    Medical: Not on file    Non-medical: Not on file  Tobacco Use  . Smoking status: Former Smoker    Types: Cigarettes    Last attempt to quit: 10/07/2001    Years since quitting: 16.5  . Smokeless tobacco: Never Used  Substance and Sexual Activity  . Alcohol use: Yes    Alcohol/week: 3.6 oz    Types: 6 Cans of beer per week    Comment: beer daily  . Drug use: No  . Sexual activity: Yes  Lifestyle  . Physical activity:    Days per week: Not on file    Minutes per session: Not on file  . Stress: Not on file  Relationships  . Social connections:    Talks on phone: Not on file    Gets together: Not on file   Attends religious service: Not on file    Active member of club or organization: Not on file    Attends meetings of clubs or organizations: Not on file    Relationship status: Not on file  . Intimate partner violence:    Fear of current or ex partner: Not on file    Emotionally abused: Not on file    Physically abused: Not on file    Forced sexual activity: Not on file  Other Topics Concern  . Not on file  Social History Narrative  . Not on file    Past Surgical History:  Procedure Laterality Date  . cyst removed from neck      age 67   . KNEE ARTHROSCOPY     2 on left knee , 1 on right  . LOOP RECORDER INSERTION N/A 01/28/2018   Procedure: LOOP RECORDER INSERTION;  Surgeon: Evans Lance, MD;  Location: [place] [facility];  Service: Cardiovascular;  Laterality: N/A;  . TEE WITHOUT CARDIOVERSION N/A 01/28/2018   Procedure: TRANSESOPHAGEAL ECHOCARDIOGRAM (TEE);  Surgeon: Acie Fredrickson Wonda Cheng, MD;  Location: [facility] ENDOSCOPY;  Service: Cardiovascular;  Laterality: N/A;  . TOTAL  KNEE ARTHROPLASTY Left 07/21/2014   Procedure: LEFT TOTAL KNEE ARTHROPLASTY;  Surgeon: Johnn Hai, MD;  Location: [facility];  Service: Orthopedics;  Laterality: Left;  . TOTAL KNEE ARTHROPLASTY Right 05/30/2016   Procedure: RIGHT TOTAL KNEE ARTHROPLASTY REPLACEMENT;  Surgeon: Susa Day, MD;  Location: [facility];  Service: Orthopedics;  Laterality: Right;    Family History  Problem Relation Age of Onset  . Colon cancer Neg Hx   . Esophageal cancer Neg Hx   . Rectal cancer Neg Hx   . Stomach cancer Neg Hx     Allergies  Allergen Reactions  . Erythromycin Base Other (See Comments)    REACTION: stomach irritation  . Penicillins Other (See Comments)    unknown    Current Outpatient Medications on File Prior to Visit  Medication Sig Dispense Refill  . aspirin 81 MG EC tablet Take 1 tablet (81 mg total) by mouth daily. 90 tablet 3  . atorvastatin (LIPITOR) 40 MG tablet Take 1 tablet (40 mg total) by mouth  daily at 6 PM. 90 tablet 3  . azelastine (ASTELIN) 0.1 % nasal spray Place 1 spray into both nostrils at bedtime.  5  . Fluticasone-Salmeterol 232-14 MCG/ACT AEPB Inhale 1 puff into the lungs 2 (two) times daily.  5   No current facility-administered medications on file prior to visit.     BP 120/66 (BP Location: Right Arm, Patient Position: Sitting, Cuff Size: Large)   Pulse 64   Temp 98.2 F (36.8 C) (Oral)   Wt 248 lb 9.6 oz (112.8 kg)   SpO2 96%   BMI 29.87 kg/m     Review of Systems  Constitutional: Negative for appetite change, chills, fatigue and fever.  HENT: Negative for congestion, dental problem, ear pain, hearing loss, sore throat, tinnitus, trouble swallowing and voice change.   Eyes: Negative for pain, discharge and visual disturbance.  Respiratory: Negative for cough, chest tightness, wheezing and stridor.   Cardiovascular: Negative for chest pain, palpitations and leg swelling.  Gastrointestinal: Negative for abdominal distention, abdominal pain, blood in stool, constipation, diarrhea, nausea and vomiting.  Genitourinary: Negative for difficulty urinating, discharge, flank pain, genital sores, hematuria and urgency.  Musculoskeletal: Negative for arthralgias, back pain, gait problem, joint swelling, myalgias and neck stiffness.  Skin: Negative for rash.  Neurological: Negative for dizziness, syncope, speech difficulty, weakness, numbness and headaches.  Hematological: Negative for adenopathy. Does not bruise/bleed easily.  Psychiatric/Behavioral: Negative for behavioral problems and dysphoric mood. The patient is not nervous/anxious.        Objective:   Physical Exam  Constitutional: He is oriented to person, place, and time. He appears well-developed. No distress.  Blood pressure 120/70  HENT:  Head: Normocephalic.  Right Ear: External ear normal.  Left Ear: External ear normal.  Eyes: Conjunctivae and EOM are normal.  Neck: Normal range of motion.    Cardiovascular: Normal rate and normal heart sounds.  Pulmonary/Chest: Breath sounds normal.  Abdominal: Bowel sounds are normal.  Musculoskeletal: Normal range of motion. He exhibits no edema or tenderness.  Neurological: He is alert and oriented to person, place, and time.  Normal neurological examination  Psychiatric: He has a normal mood and affect. His behavior is normal.          Assessment & Plan:   Status post left MCA infarct.  Full neurological recovery.  Continue aspirin and statin therapy Essential hypertension well-controlled  Medications are refilled Follow-up with new provider in 6 months Neurology follow-up as scheduled  Marletta Lor

## 2018-04-24 ENCOUNTER — Other Ambulatory Visit: Payer: Self-pay

## 2018-05-01 ENCOUNTER — Other Ambulatory Visit: Payer: Self-pay

## 2018-05-01 NOTE — Patient Outreach (Signed)
Telephone outreach to patient to obtain mRS was successfully completed. mRS = 0 

## 2018-05-04 ENCOUNTER — Ambulatory Visit (INDEPENDENT_AMBULATORY_CARE_PROVIDER_SITE_OTHER): Payer: Medicare Other | Admitting: *Deleted

## 2018-05-04 DIAGNOSIS — I639 Cerebral infarction, unspecified: Secondary | ICD-10-CM

## 2018-05-05 LAB — CUP PACEART REMOTE DEVICE CHECK
Date Time Interrogation Session: 20190626154035
MDC IDC PG IMPLANT DT: 20190424

## 2018-05-05 NOTE — Progress Notes (Signed)
Carelink Summary Report / Loop Recorder 

## 2018-05-06 ENCOUNTER — Ambulatory Visit: Payer: Self-pay | Admitting: Internal Medicine

## 2018-05-06 ENCOUNTER — Telehealth: Payer: Self-pay | Admitting: *Deleted

## 2018-05-06 NOTE — Telephone Encounter (Signed)
LM with patient's wife Charles Mccoy [facility]) to have patient send a manual transmission to review 6 pause episodes and call us regarding any symptoms. Patient's wife states he has not complained of any symptoms and has not passed out, she states he has been working out in the heat all week. He will be home this evening to send the transmission and will call back if he had any symptoms of syncope or presyncope. Advised we will review manual transmission once we receive it and call back if any episodes appear different then past episodes. Patient's wife verbalized understanding and appreciation.

## 2018-05-07 ENCOUNTER — Telehealth: Payer: Self-pay

## 2018-05-07 NOTE — Telephone Encounter (Signed)
LMOVM reminding pt to send remote transmission.   

## 2018-05-12 NOTE — Telephone Encounter (Signed)
Received and reviewed manual transmission 05/06/18. All episodes appear nocturnal 00:54- 8:12, longest 5 seconds. Spoke with patient regarding symptoms, patient states no syncope or presyncope. He states he has been working outside all week. Advised patient will place in Dr. Tanna Furry folder for review. Patient verbalized understanding and appreciation.

## 2018-05-15 ENCOUNTER — Telehealth: Payer: Self-pay | Admitting: *Deleted

## 2018-05-15 NOTE — Telephone Encounter (Signed)
Spoke with patient to request manual Carelink transmission for review of pause episodes.  Advised that available ECG shows nocturnal episode (previously noted).  Patient agrees to send transmission today.  He denies additional questions or concerns at this time.

## 2018-05-22 NOTE — Telephone Encounter (Signed)
Spoke with patient's wife (DPR) to request manual Carelink transmission for review. Monitor has not connected since 05/15/18. Patient is working out of town right now, but has his monitor. She plans to text him to ask him to send a manual transmission. Advised to have patient call our office if he receives an error code or has any issues transmitting. She verbalizes understanding and denies additional questions or concerns at this time.

## 2018-05-25 NOTE — Telephone Encounter (Signed)
Manual transmission received. 4 pause episodes appear sinus pauses, all nocturnal. Will place in Dr. Tanna Furry folder for review.

## 2018-06-05 ENCOUNTER — Telehealth: Payer: Self-pay | Admitting: *Deleted

## 2018-06-05 NOTE — Telephone Encounter (Signed)
Spoke w/ pt wife and she agreed to an appt on 06-15-18 at 10:30 AM.

## 2018-06-05 NOTE — Telephone Encounter (Signed)
LMOM (DPR) requesting call back to schedule non-urgent [facility] appointment for [facility] reprogramming. Due to known asymptomatic nocturnal pauses, plan to extend pause detection to >4.5sec per Dr. Lovena Le.

## 2018-06-05 NOTE — Progress Notes (Signed)
[facility] [address] [place]. [place] [address] [phone]       OFFICE FOLLOW UP NOTE  [name] Date of Birth:  1951/08/12 Medical Record Number:  [id number]   Reason for Referral:  hospital stroke follow up  CHIEF COMPLAINT:  Chief Complaint  Patient presents with  . Follow-up    Cerebreal Infaraction follow up room in back hallway pt alone    HPI: Charles Mccoy is being seen today for in the office for left MCA infarct on 01/18/18. History obtained from patient, wife and chart review. Reviewed all radiology images and labs personally.  Charles Mccoy an 67 y.o.malewith PMH of HTN, HLD presents to [facility] as a stroke alert after watching TV around 8.15 pm on 01/18/18 and noticed his right arm felt numb and when he got up he noticed it was not moving along with slurred speech.Tele stroke neurology was consulted and he received IV TPA with mild right hemiparesis.  Patient was transferred to [facility] for post TPA care and further evaluation and treatment.  CT head showed no acute stroke or hemorrhage.  MRI brain reviewed and showed scattered small left frontal and parietal infarcts along with small vessel disease and atrophy which was moderately advanced for his age.  MRA was normal.  Carotid Doppler showed bilateral ICA stenosis of 1 to 39% along with the VAs antegrade.  Etiology embolic infarct secondary to unknown source.  TEE was requested unable to do for 2 days due to full schedule and was discussed with cardiology at discharge to schedule this is outpatient.  Recommended a TEE shows a PFO to do a lower extremity Doppler to look for possible DVT as source of stroke.  LDL 113 and A1c 5.4.  Patient was started on Lipitor 40 mg.  Not on antithrombotic PTA and recommended DAPT with aspirin and Plavix for 3 weeks and then aspirin alone.  Therapy recommended outpatient PT/OT and was discharged home in stable condition with wife.  Patient did undergo TEE on  01/28/2018 which did show EF of 60 to 65% without PFO or cardiac source of embolism.  Loop recorder was placed at that time.  02/23/18 visit: Since discharge, patient has been doing well and is accompanied at his follow-up visit by his wife.  Denies residual side effects from stroke and has graduated from PT/OT/ST.  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  He has continued taking aspirin and Plavix without side effects of bleeding or bruising.  Continues to take Lipitor without side effects of myalgias.  Blood pressure today satisfactory at 126/71.  He states he has been keeping active and trying to eat a healthier diet.  Loop recorder has not shown atrial fibrillation thus far.  Denies new or worsening stroke/TIA symptoms at this time.  Interval History 06/09/18: Patient seen today for scheduled office visit.  He continues to do well from a stroke standpoint without residual deficits.  Continues to take aspirin 81 mg with mild bruising but no bleeding.  Continues to take Lipitor 40 mg without myalgias.  He did have recent visit with PCP but lipid panel was not obtained.  Blood pressure today satisfactory at 123/77.  Loop recorder has not shown atrial fibrillation thus far.  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  He is considering doing less travel work and staying more around the [place] area.  Denies  new or worsening stroke/TIA symptoms.   ROS:   14 system review of systems performed and negative with exception of no complaints  PMH:  Past Medical History:  Diagnosis Date  . ALLERGIC RHINITIS 06/19/2007  . ASTHMA 08/17/2008  . Back pain 05/2016  . COPD (chronic obstructive pulmonary disease) ([place])   . GERD (gastroesophageal reflux disease)   . HYPERTENSION 06/19/2007  . Hypertension    meds controlling well  . OSTEOARTHRITIS 10/09/2009   wrists, knee.  . Pneumonia    hx of   . SINUSITIS  09/15/2007   recent issuses 2 weeks ago-clearing.  . Stroke [facility])     PSH:  Past Surgical History:  Procedure Laterality Date  . cyst removed from neck      age 26   . KNEE ARTHROSCOPY     2 on left knee , 1 on right  . LOOP RECORDER INSERTION N/A 01/28/2018   Procedure: LOOP RECORDER INSERTION;  Surgeon: Evans Lance, MD;  Location: [facility];  Service: Cardiovascular;  Laterality: N/A;  . TEE WITHOUT CARDIOVERSION N/A 01/28/2018   Procedure: TRANSESOPHAGEAL ECHOCARDIOGRAM (TEE);  Surgeon: Acie Fredrickson Wonda Cheng, MD;  Location: [facility] ENDOSCOPY;  Service: Cardiovascular;  Laterality: N/A;  . TOTAL KNEE ARTHROPLASTY Left 07/21/2014   Procedure: LEFT TOTAL KNEE ARTHROPLASTY;  Surgeon: Johnn Hai, MD;  Location: [facility] ORS;  Service: Orthopedics;  Laterality: Left;  . TOTAL KNEE ARTHROPLASTY Right 05/30/2016   Procedure: RIGHT TOTAL KNEE ARTHROPLASTY REPLACEMENT;  Surgeon: Susa Day, MD;  Location: [facility] ORS;  Service: Orthopedics;  Laterality: Right;    Social History:  Social History   Socioeconomic History  . Marital status: Married    Spouse name: Not on file  . Number of children: Not on file  . Years of education: Not on file  . Highest education level: Not on file  Occupational History  . Not on file  Social Needs  . Financial resource strain: Not on file  . Food insecurity:    Worry: Not on file    Inability: Not on file  . Transportation needs:    Medical: Not on file    Non-medical: Not on file  Tobacco Use  . Smoking status: Former Smoker    Types: Cigarettes    Last attempt to quit: 10/07/2001    Years since quitting: 16.6  . Smokeless tobacco: Never Used  Substance and Sexual Activity  . Alcohol use: Yes    Alcohol/week: 6.0 standard drinks    Types: 6 Cans of beer per week    Comment: beer daily  . Drug use: No  . Sexual activity: Yes  Lifestyle  . Physical activity:    Days per week: Not on file    Minutes per session: Not on file  . Stress: Not  on file  Relationships  . Social connections:    Talks on phone: Not on file    Gets together: Not on file    Attends religious service: Not on file    Active member of club or organization: Not on file    Attends meetings of clubs or organizations: Not on file    Relationship status: Not on file  . Intimate partner violence:    Fear of current or ex partner: Not on file    Emotionally abused: Not on file    Physically abused: Not on file    Forced sexual activity: Not on file  Other Topics Concern  . Not on file  Social History Narrative  . Not on file    Family History:  Family History  Problem Relation Age of Onset  . Colon cancer Neg Hx   . Esophageal cancer Neg Hx   . Rectal cancer Neg Hx   . Stomach cancer Neg Hx     Medications:   Current Outpatient Medications on File Prior to Visit  Medication Sig Dispense Refill  . amLODipine (NORVASC) 10 MG tablet TAKE 1 TABLET BY MOUTH EVERY DAY IN THE MORNING 90 tablet 4  . aspirin 81 MG EC tablet Take 1 tablet (81 mg total) by mouth daily. 90 tablet 3  . atorvastatin (LIPITOR) 40 MG tablet Take 1 tablet (40 mg total) by mouth daily at 6 PM. 90 tablet 3  . azelastine (ASTELIN) 0.1 % nasal spray Place 1 spray into both nostrils at bedtime.  5  . Fluticasone-Salmeterol (ADVAIR DISKUS) 100-50 MCG/DOSE AEPB Advair Diskus 100 mcg-50 mcg/dose powder for inhalation    . Fluticasone-Salmeterol 232-14 MCG/ACT AEPB Inhale 1 puff into the lungs 2 (two) times daily.  5  . pantoprazole (PROTONIX) 40 MG tablet Take 1 tablet (40 mg total) by mouth daily. 90 tablet 3  . spironolactone (ALDACTONE) 25 MG tablet Take 1 tablet (25 mg total) by mouth daily. 90 tablet 4   No current facility-administered medications on file prior to visit.     Allergies:   Allergies  Allergen Reactions  . Erythromycin Base Other (See Comments)    REACTION: stomach irritation  . Penicillins Other (See Comments)    unknown     Physical Exam  Vitals:    06/09/18 1049  BP: 123/77  Pulse: 62  Weight: 245 lb 12.8 oz (111.5 kg)  Height: 6' 4.5" (1.943 m)   Body mass index is 29.53 kg/m. No exam data present  General: well developed, pleasant middle-aged Caucasian male, well nourished, seated, in no evident distress Head: head normocephalic and atraumatic.   Neck: supple with no carotid or supraclavicular bruits Cardiovascular: regular rate and rhythm, no murmurs Musculoskeletal: no deformity Skin:  no rash/petichiae Vascular:  Normal pulses all extremities  Neurologic Exam Mental Status: Awake and fully alert. Oriented to place and time. Recent and remote memory intact. Attention span, concentration and fund of knowledge appropriate. Mood and affect appropriate.  Cranial Nerves: Fundoscopic exam reveals sharp disc margins. Pupils equal, briskly reactive to light. Extraocular movements full without nystagmus. Visual fields full to confrontation. Hearing intact. Facial sensation intact. Face, tongue, palate moves normally and symmetrically.  Motor: Normal bulk and tone. Normal strength in all tested extremity muscles. Sensory.: intact to touch , pinprick , position and vibratory sensation.  Coordination: Rapid alternating movements normal in all extremities. Finger-to-nose and heel-to-shin performed accurately bilaterally. Gait and Station: Arises from chair without difficulty. Stance is normal. Gait demonstrates normal stride length and balance . Able to heel, toe and tandem walk without difficulty.  Reflexes: 1+ and symmetric. Toes downgoing.      Diagnostic Data (Labs, Imaging, Testing)  CT HEAD WO CONTRAST 01/18/2018 IMPRESSION: 1. No acute finding.  ASPECTS is 10. 2. Moderate chronic small vessel ischemia in the cerebral white Matter.  MR BRAIN WO CONTRAST 01/19/2018 IMPRESSION: 1. Scattered small acute/subacute nonhemorrhagic cortical infarcts in the posterior left frontal lobe and anterior left parietal lobe near the  vertex. These correspond with the patient's abrupt onset of right upper extremity weakness and numbness. 2. Subjacent white matter restricted diffusion corresponds with the cortical spinal tracts. 3. Atrophy and white matter  disease in addition to these areas is moderately advanced for age. This likely reflects the sequela of chronic microvascular ischemia.  MR MRA HEAD WO CONTRAST 01/19/2018 IMPRESSION: Normal variant MRA circle of Willis without significant proximal stenosis, aneurysm, or branch vessel occlusion.  ECHOCARDIOGRAM 01/19/2018 Study Conclusions - Left ventricle: The cavity size was moderately dilated. Systolic   function was normal. The estimated ejection fraction was in the   range of 60% to 65%. Wall motion was normal; there were no   regional wall motion abnormalities. Left ventricular diastolic   function parameters were normal. - Aortic valve: There was mild regurgitation. - Mitral valve: Calcified annulus. - Left atrium: The atrium was mildly dilated. - Right ventricle: The cavity size was moderately dilated. Wall   thickness was normal. - Atrial septum: There was increased thickness of the septum,   consistent with lipomatous hypertrophy.  VAS US CAROTID DUPLEX BILATERAL 01/19/18 Final Interpretation: Right Carotid: Velocities in the right ICA are consistent with a 1-39% stenosis. Left Carotid: Velocities in the left ICA are consistent with a 1-39% stenosis. Vertebrals: Bilateral vertebral arteries demonstrate antegrade flow.  ECHO TEE 01/28/2018 Impressions: - Normal LV function   The ascending aorta was mildly dilated.   no PFO or ASD seen.   There was a late appearing bubble in the LA that was likely from   a pulmonary AVM     ASSESSMENT: Decklin Weddington is a 67 y.o. year old male here with left MCA infarcts on 01/18/18 secondary to possibly embolic with unknown source. Vascular risk factors include HLD and HTN.  Patient returns today for follow-up  visit and overall is doing well without residual stroke deficits.   PLAN: -Continue aspirin 81 mg daily  and Lipitor for secondary stroke prevention -F/u with PCP regarding your HTN and HLD management -Lipid panel at today's appointment to ensure Lipitor dosage adequate -continue to monitor BP at home -Continue to monitor loop recorder -Advised to continue to stay active and maintain a healthy diet -Maintain strict control of hypertension with blood pressure goal below 130/90, diabetes with hemoglobin A1c goal below 6.5% and cholesterol with LDL cholesterol (bad cholesterol) goal below 70 mg/dL. I also advised the patient to eat a healthy diet with plenty of whole grains, cereals, fruits and vegetables, exercise regularly and maintain ideal body weight.  Follow up in 6 months or call earlier if needed   Greater than 50% of time during this 25 minute visit was spent on counseling,explanation of diagnosis of left MCA, reviewing risk factor management of HLD and HTN, planning of further management, discussion with patient and family and coordination of care   Venancio Poisson, [facility]  [facility] [address] [place],  [address]  Phone [phone] Fax [phone]

## 2018-06-09 ENCOUNTER — Encounter: Payer: Self-pay | Admitting: Adult Health

## 2018-06-09 ENCOUNTER — Ambulatory Visit (INDEPENDENT_AMBULATORY_CARE_PROVIDER_SITE_OTHER): Payer: Medicare Other | Admitting: Adult Health

## 2018-06-09 ENCOUNTER — Ambulatory Visit (INDEPENDENT_AMBULATORY_CARE_PROVIDER_SITE_OTHER): Payer: Medicare Other | Admitting: *Deleted

## 2018-06-09 ENCOUNTER — Ambulatory Visit: Payer: 59 | Admitting: Adult Health

## 2018-06-09 VITALS — BP 123/77 | HR 62 | Ht 76.5 in | Wt 245.8 lb

## 2018-06-09 DIAGNOSIS — I10 Essential (primary) hypertension: Secondary | ICD-10-CM

## 2018-06-09 DIAGNOSIS — I63412 Cerebral infarction due to embolism of left middle cerebral artery: Secondary | ICD-10-CM

## 2018-06-09 DIAGNOSIS — E785 Hyperlipidemia, unspecified: Secondary | ICD-10-CM | POA: Diagnosis not present

## 2018-06-09 DIAGNOSIS — I639 Cerebral infarction, unspecified: Secondary | ICD-10-CM

## 2018-06-09 NOTE — Progress Notes (Signed)
Carelink Summary Report / Loop Recorder 

## 2018-06-09 NOTE — Patient Instructions (Addendum)
Continue aspirin 81 mg daily  and lipitor 40mg   for secondary stroke prevention  Continue to follow up with PCP regarding cholesterol and blood pressure management   We will check cholesterol panel today   We will continue to monitor your loop recorder for atrial fibrillation   Continue to monitor blood pressure at home  Maintain strict control of hypertension with blood pressure goal below 130/90, diabetes with hemoglobin A1c goal below 6.5% and cholesterol with LDL cholesterol (bad cholesterol) goal below 70 mg/dL. I also advised the patient to eat a healthy diet with plenty of whole grains, cereals, fruits and vegetables, exercise regularly and maintain ideal body weight.  Followup in the future with me in 6 months or call earlier if needed       Thank you for coming to see [place] at [facility]. I hope we have been able to provide you high quality care today.  You may receive a patient satisfaction survey over the next few weeks. We would appreciate your feedback and comments so that we may continue to improve ourselves and the health of our patients.

## 2018-06-10 LAB — LIPID PANEL
CHOLESTEROL TOTAL: 115 mg/dL (ref 100–199)
Chol/HDL Ratio: 3 ratio (ref 0.0–5.0)
HDL: 38 mg/dL — AB (ref >39)
LDL Calculated: 63 mg/dL (ref 0–99)
TRIGLYCERIDES: 69 mg/dL (ref 0–149)
VLDL Cholesterol Cal: 14 mg/dL (ref 5–40)

## 2018-06-12 ENCOUNTER — Other Ambulatory Visit: Payer: Self-pay | Admitting: Internal Medicine

## 2018-06-12 NOTE — Progress Notes (Signed)
I agree with the above plan 

## 2018-06-15 ENCOUNTER — Ambulatory Visit (INDEPENDENT_AMBULATORY_CARE_PROVIDER_SITE_OTHER): Payer: Medicare Other | Admitting: *Deleted

## 2018-06-15 DIAGNOSIS — Z4509 Encounter for adjustment and management of other cardiac device: Secondary | ICD-10-CM

## 2018-06-15 DIAGNOSIS — I639 Cerebral infarction, unspecified: Secondary | ICD-10-CM

## 2018-06-15 LAB — CUP PACEART INCLINIC DEVICE CHECK
Implantable Pulse Generator Implant Date: 20190424
MDC IDC SESS DTM: 20190909105119

## 2018-06-15 LAB — CUP PACEART REMOTE DEVICE CHECK
Implantable Pulse Generator Implant Date: 20190424
MDC IDC SESS DTM: 20190729153637

## 2018-06-15 NOTE — Progress Notes (Signed)
LINQ check in clinic for reprogramming. 24 pauses- all asymptomatic/nocturnal. Pause detection increased to 4.5sec per GT. MSR and ROV with GT PRN.

## 2018-06-16 ENCOUNTER — Ambulatory Visit: Payer: Self-pay | Admitting: Adult Health

## 2018-07-01 LAB — CUP PACEART REMOTE DEVICE CHECK
Date Time Interrogation Session: 20190831160848
MDC IDC PG IMPLANT DT: 20190424

## 2018-07-09 ENCOUNTER — Ambulatory Visit (INDEPENDENT_AMBULATORY_CARE_PROVIDER_SITE_OTHER): Payer: Medicare Other | Admitting: *Deleted

## 2018-07-09 DIAGNOSIS — I639 Cerebral infarction, unspecified: Secondary | ICD-10-CM | POA: Diagnosis not present

## 2018-07-10 NOTE — Progress Notes (Signed)
Carelink Summary Report / Loop Recorder 

## 2018-07-10 NOTE — Progress Notes (Signed)
letter

## 2018-07-13 LAB — CUP PACEART REMOTE DEVICE CHECK
Date Time Interrogation Session: 20191003160841
Implantable Pulse Generator Implant Date: 20190424

## 2018-08-11 ENCOUNTER — Ambulatory Visit (INDEPENDENT_AMBULATORY_CARE_PROVIDER_SITE_OTHER): Payer: Medicare Other | Admitting: *Deleted

## 2018-08-11 DIAGNOSIS — J3089 Other allergic rhinitis: Secondary | ICD-10-CM | POA: Diagnosis not present

## 2018-08-11 DIAGNOSIS — I639 Cerebral infarction, unspecified: Secondary | ICD-10-CM

## 2018-08-11 DIAGNOSIS — J3081 Allergic rhinitis due to animal (cat) (dog) hair and dander: Secondary | ICD-10-CM | POA: Diagnosis not present

## 2018-08-11 DIAGNOSIS — J4540 Moderate persistent asthma, uncomplicated: Secondary | ICD-10-CM | POA: Diagnosis not present

## 2018-08-11 DIAGNOSIS — J301 Allergic rhinitis due to pollen: Secondary | ICD-10-CM | POA: Diagnosis not present

## 2018-08-12 NOTE — Progress Notes (Signed)
Carelink Summary Report / Loop Recorder 

## 2018-08-17 ENCOUNTER — Ambulatory Visit: Payer: Self-pay

## 2018-08-17 ENCOUNTER — Emergency Department (HOSPITAL_COMMUNITY)
Admission: EM | Admit: 2018-08-17 | Discharge: 2018-08-17 | Disposition: A | Discharge status: Home / Self Care | Payer: Medicare Other | Attending: Emergency Medicine | Admitting: Emergency Medicine

## 2018-08-17 ENCOUNTER — Encounter (HOSPITAL_COMMUNITY): Payer: Self-pay | Admitting: Family Medicine

## 2018-08-17 ENCOUNTER — Emergency Department (HOSPITAL_COMMUNITY): Payer: Medicare Other

## 2018-08-17 DIAGNOSIS — N201 Calculus of ureter: Secondary | ICD-10-CM

## 2018-08-17 DIAGNOSIS — Z79899 Other long term (current) drug therapy: Secondary | ICD-10-CM | POA: Insufficient documentation

## 2018-08-17 DIAGNOSIS — J449 Chronic obstructive pulmonary disease, unspecified: Secondary | ICD-10-CM | POA: Diagnosis not present

## 2018-08-17 DIAGNOSIS — Z8673 Personal history of transient ischemic attack (TIA), and cerebral infarction without residual deficits: Secondary | ICD-10-CM | POA: Insufficient documentation

## 2018-08-17 DIAGNOSIS — J45909 Unspecified asthma, uncomplicated: Secondary | ICD-10-CM | POA: Diagnosis not present

## 2018-08-17 DIAGNOSIS — Z96653 Presence of artificial knee joint, bilateral: Secondary | ICD-10-CM | POA: Insufficient documentation

## 2018-08-17 DIAGNOSIS — Z7982 Long term (current) use of aspirin: Secondary | ICD-10-CM | POA: Insufficient documentation

## 2018-08-17 DIAGNOSIS — I10 Essential (primary) hypertension: Secondary | ICD-10-CM | POA: Insufficient documentation

## 2018-08-17 DIAGNOSIS — R1031 Right lower quadrant pain: Secondary | ICD-10-CM | POA: Diagnosis present

## 2018-08-17 DIAGNOSIS — N132 Hydronephrosis with renal and ureteral calculous obstruction: Secondary | ICD-10-CM | POA: Diagnosis not present

## 2018-08-17 DIAGNOSIS — Z87891 Personal history of nicotine dependence: Secondary | ICD-10-CM | POA: Diagnosis not present

## 2018-08-17 LAB — URINALYSIS, ROUTINE W REFLEX MICROSCOPIC
BILIRUBIN URINE: NEGATIVE
GLUCOSE, UA: NEGATIVE mg/dL
Ketones, ur: 20 mg/dL — AB
LEUKOCYTES UA: NEGATIVE
Nitrite: NEGATIVE
Protein, ur: NEGATIVE mg/dL
SPECIFIC GRAVITY, URINE: 1.015 (ref 1.005–1.030)
pH: 6 (ref 5.0–8.0)

## 2018-08-17 LAB — CBC WITH DIFFERENTIAL/PLATELET
Abs Immature Granulocytes: 0.04 10*3/uL (ref 0.00–0.07)
BASOS ABS: 0 10*3/uL (ref 0.0–0.1)
BASOS PCT: 0 %
EOS ABS: 0 10*3/uL (ref 0.0–0.5)
EOS PCT: 0 %
HEMATOCRIT: 47 % (ref 39.0–52.0)
Hemoglobin: 15.5 g/dL (ref 13.0–17.0)
Immature Granulocytes: 0 %
LYMPHS ABS: 0.9 10*3/uL (ref 0.7–4.0)
Lymphocytes Relative: 7 %
MCH: 28.4 pg (ref 26.0–34.0)
MCHC: 33 g/dL (ref 30.0–36.0)
MCV: 86.1 fL (ref 80.0–100.0)
Monocytes Absolute: 0.6 10*3/uL (ref 0.1–1.0)
Monocytes Relative: 5 %
NRBC: 0 % (ref 0.0–0.2)
Neutro Abs: 10.3 10*3/uL — ABNORMAL HIGH (ref 1.7–7.7)
Neutrophils Relative %: 88 %
PLATELETS: 247 10*3/uL (ref 150–400)
RBC: 5.46 MIL/uL (ref 4.22–5.81)
RDW: 13.2 % (ref 11.5–15.5)
WBC: 11.8 10*3/uL — AB (ref 4.0–10.5)

## 2018-08-17 LAB — COMPREHENSIVE METABOLIC PANEL
ALK PHOS: 59 U/L (ref 38–126)
ALT: 19 U/L (ref 0–44)
ANION GAP: 10 (ref 5–15)
AST: 15 U/L (ref 15–41)
Albumin: 4.1 g/dL (ref 3.5–5.0)
BUN: 15 mg/dL (ref 8–23)
CALCIUM: 9.2 mg/dL (ref 8.9–10.3)
CO2: 26 mmol/L (ref 22–32)
Chloride: 101 mmol/L (ref 98–111)
Creatinine, Ser: 1.01 mg/dL (ref 0.61–1.24)
Glucose, Bld: 110 mg/dL — ABNORMAL HIGH (ref 70–99)
Potassium: 4.5 mmol/L (ref 3.5–5.1)
SODIUM: 137 mmol/L (ref 135–145)
TOTAL PROTEIN: 7.8 g/dL (ref 6.5–8.1)
Total Bilirubin: 0.8 mg/dL (ref 0.3–1.2)

## 2018-08-17 LAB — LIPASE, BLOOD: Lipase: 26 U/L (ref 11–51)

## 2018-08-17 MED ORDER — SODIUM CHLORIDE (PF) 0.9 % IJ SOLN
INTRAMUSCULAR | Status: AC
  Filled 2018-08-17: qty 50

## 2018-08-17 MED ORDER — OXYCODONE-ACETAMINOPHEN 5-325 MG PO TABS
1.0000 | ORAL_TABLET | Freq: Once | ORAL | Status: AC
  Administered 2018-08-17: 1 via ORAL
  Filled 2018-08-17: qty 1

## 2018-08-17 MED ORDER — IOPAMIDOL (ISOVUE-300) INJECTION 61%
INTRAVENOUS | Status: AC
  Filled 2018-08-17: qty 100

## 2018-08-17 MED ORDER — OXYCODONE-ACETAMINOPHEN 5-325 MG PO TABS: 1.0000 | ORAL_TABLET | ORAL | 0 refills | Status: DC | PRN

## 2018-08-17 MED ORDER — IOPAMIDOL (ISOVUE-300) INJECTION 61%
100.0000 mL | Freq: Once | INTRAVENOUS | Status: AC | PRN
  Administered 2018-08-17: 100 mL via INTRAVENOUS

## 2018-08-17 MED ORDER — ONDANSETRON 4 MG PO TBDP: 4.0000 mg | ORAL_TABLET | Freq: Three times a day (TID) | ORAL | 0 refills | Status: DC | PRN

## 2018-08-17 MED ORDER — SODIUM CHLORIDE 0.9 % IV BOLUS
1000.0000 mL | Freq: Once | INTRAVENOUS | Status: AC
  Administered 2018-08-17: 1000 mL via INTRAVENOUS

## 2018-08-17 MED ORDER — FENTANYL CITRATE (PF) 100 MCG/2ML IJ SOLN
50.0000 ug | Freq: Once | INTRAMUSCULAR | Status: AC
  Administered 2018-08-17: 50 ug via INTRAVENOUS
  Filled 2018-08-17: qty 2

## 2018-08-17 NOTE — Telephone Encounter (Signed)
Pt has arrived at [facility] ED.  

## 2018-08-17 NOTE — ED Triage Notes (Signed)
Patient is complaining of right lower abd pain that radiates to around to his lower back. Also, complains nausea, vomiting, and urgency to urinate. Symptoms started Saturday but has progressively got worse. Patient has not took any medication for his symptoms. Unsure of fever at home.

## 2018-08-17 NOTE — Telephone Encounter (Signed)
Will monitor for ED arrival.  

## 2018-08-17 NOTE — ED Provider Notes (Signed)
[facility] DEPT Provider Note   CSN: [id number] Arrival date & time: 08/17/18  1258     History   Chief Complaint Chief Complaint  Patient presents with  . Abdominal Pain    HPI Charles Mccoy is a 67 y.o. male.= With a past medical history of hypertension, prior CVA, COPD, GERD who presents to ED for 2-day history of right lower quadrant abdominal pain, radiating to right flank and right sided back.  States that the pain feels like "a gas pain or like food poisoning" States that symptoms initially began 2 days ago.  They improved yesterday.  However, states that he woke up this morning with worsening of his pain.  He had associated nausea and one episode of nonbloody, nonbilious emesis.  He has tried one Gas-X with no improvement in his symptoms.  No history of similar symptoms in the past.  He states that he had several normal bowel movements today and increased urge to defecate.  Denies any diarrhea or blood in his stool.  No urinary symptoms, fever, sick contacts.  Reports a daily alcohol use, denies any other drug use.  Denies chest pain, shortness of breath.  HPI  Past Medical History:  Diagnosis Date  . ALLERGIC RHINITIS 06/19/2007  . ASTHMA 08/17/2008  . Back pain 05/2016  . COPD (chronic obstructive pulmonary disease) ([place])   . GERD (gastroesophageal reflux disease)   . HYPERTENSION 06/19/2007  . Hypertension    meds controlling well  . OSTEOARTHRITIS 10/09/2009   wrists, knee.  . Pneumonia    hx of   . SINUSITIS 09/15/2007   recent issuses 2 weeks ago-clearing.  . Stroke [facility])     Patient Active Problem List   Diagnosis Date Noted  . Cerebrovascular accident (CVA) ([place])   . Hyperlipidemia 01/20/2018  . BPH (benign prostatic hyperplasia) 01/20/2018  . COPD (chronic obstructive pulmonary disease) ([place]) 01/20/2018  . Cerebral infarction due to embolism of left middle cerebral artery ([place]) s/p tPA 01/19/2018  . Suspected stroke patient  last known to be well 2 to 3 hours ago 01/18/2018  . Primary osteoarthritis of right knee 05/30/2016  . Right knee DJD 05/30/2016  . GERD (gastroesophageal reflux disease) 07/28/2014  . Left knee DJD 07/21/2014  . Osteoarthritis 10/09/2009  . Asthma 08/17/2008  . SINUSITIS 09/15/2007  . Essential hypertension 06/19/2007  . Allergic rhinitis 06/19/2007    Past Surgical History:  Procedure Laterality Date  . cyst removed from neck      age 89   . KNEE ARTHROSCOPY     2 on left knee , 1 on right  . LOOP RECORDER INSERTION N/A 01/28/2018   Procedure: LOOP RECORDER INSERTION;  Surgeon: Evans Lance, MD;  Location: [facility];  Service: Cardiovascular;  Laterality: N/A;  . TEE WITHOUT CARDIOVERSION N/A 01/28/2018   Procedure: TRANSESOPHAGEAL ECHOCARDIOGRAM (TEE);  Surgeon: Acie Fredrickson Wonda Cheng, MD;  Location: [facility] ENDOSCOPY;  Service: Cardiovascular;  Laterality: N/A;  . TOTAL KNEE ARTHROPLASTY Left 07/21/2014   Procedure: LEFT TOTAL KNEE ARTHROPLASTY;  Surgeon: Johnn Hai, MD;  Location: [facility] ORS;  Service: Orthopedics;  Laterality: Left;  . TOTAL KNEE ARTHROPLASTY Right 05/30/2016   Procedure: RIGHT TOTAL KNEE ARTHROPLASTY REPLACEMENT;  Surgeon: Susa Day, MD;  Location: [facility] ORS;  Service: Orthopedics;  Laterality: Right;        Home Medications    Prior to Admission medications   Medication Sig Start Date End Date Taking? Authorizing Provider  amLODipine (NORVASC) 10  MG tablet TAKE 1 TABLET BY MOUTH EVERY DAY IN THE MORNING 04/21/18  Yes Marletta Lor, MD  aspirin 81 MG EC tablet Take 1 tablet (81 mg total) by mouth daily. 02/23/18  Yes Venancio Poisson, NP  atorvastatin (LIPITOR) 40 MG tablet Take 1 tablet (40 mg total) by mouth daily at 6 PM. 02/23/18  Yes Vanschaick, Janett Billow, NP  azelastine (ASTELIN) 0.1 % nasal spray Place 1 spray into both nostrils at bedtime. 01/13/18  Yes [provider]  fexofenadine (ALLEGRA) 60 MG tablet Take 60 mg by mouth daily.    Yes [provider]  Fluticasone-Salmeterol 232-14 MCG/ACT AEPB Inhale 1 puff into the lungs 2 (two) times daily. 01/13/18  Yes [provider]  pantoprazole (PROTONIX) 40 MG tablet Take 1 tablet (40 mg total) by mouth daily. 04/21/18  Yes Marletta Lor, MD  spironolactone (ALDACTONE) 25 MG tablet Take 1 tablet (25 mg total) by mouth daily. 04/21/18  Yes Marletta Lor, MD  ondansetron (ZOFRAN ODT) 4 MG disintegrating tablet Take 1 tablet (4 mg total) by mouth every 8 (eight) hours as needed for nausea or vomiting. 08/17/18   Ja Pistole, PA-C  oxyCODONE-acetaminophen (PERCOCET/ROXICET) 5-325 MG tablet Take 1 tablet by mouth every 4 (four) hours as needed for severe pain. 08/17/18   Delia Heady, PA-C    Family History Family History  Problem Relation Age of Onset  . Colon cancer Neg Hx   . Esophageal cancer Neg Hx   . Rectal cancer Neg Hx   . Stomach cancer Neg Hx     Social History Social History   Tobacco Use  . Smoking status: Former Smoker    Types: Cigarettes    Last attempt to quit: 10/07/2001    Years since quitting: 16.8  . Smokeless tobacco: Never Used  Substance Use Topics  . Alcohol use: Yes    Alcohol/week: 6.0 standard drinks    Types: 6 Cans of beer per week    Comment: beer daily  . Drug use: No     Allergies   Erythromycin base and Penicillins   Review of Systems Review of Systems  Constitutional: Negative for appetite change, chills and fever.  HENT: Negative for ear pain, rhinorrhea, sneezing and sore throat.   Eyes: Negative for photophobia and visual disturbance.  Respiratory: Negative for cough, chest tightness, shortness of breath and wheezing.   Cardiovascular: Negative for chest pain and palpitations.  Gastrointestinal: Positive for abdominal pain, nausea and vomiting. Negative for blood in stool, constipation and diarrhea.  Genitourinary: Positive for flank pain. Negative for dysuria, hematuria and urgency.    Musculoskeletal: Negative for myalgias.  Skin: Negative for rash.  Neurological: Negative for dizziness, weakness and light-headedness.     Physical Exam Updated Vital Signs BP (!) 166/96 (BP Location: Left Arm)   Pulse 76   Temp 97.9 F (36.6 C) (Oral)   Resp 14   Ht 6\' 4"  (1.93 m)   Wt 111.6 kg   SpO2 99%   BMI 29.94 kg/m   Physical Exam  Constitutional: He appears well-developed and well-nourished. No distress.  HENT:  Head: Normocephalic and atraumatic.  Nose: Nose normal.  Eyes: Conjunctivae and EOM are normal. Left eye exhibits no discharge. No scleral icterus.  Neck: Normal range of motion. Neck supple.  Cardiovascular: Normal rate, regular rhythm, normal heart sounds and intact distal pulses. Exam reveals no gallop and no friction rub.  No murmur heard. Pulmonary/Chest: Effort normal and breath sounds normal. No respiratory  distress.  Abdominal: Soft. Bowel sounds are normal. He exhibits no distension. There is tenderness (And right flank) in the right lower quadrant. There is CVA tenderness. There is no guarding.  Musculoskeletal: Normal range of motion. He exhibits no edema.  Neurological: He is alert. He exhibits normal muscle tone. Coordination normal.  Skin: Skin is warm and dry. No rash noted.  Psychiatric: He has a normal mood and affect.  Nursing note and vitals reviewed.    ED Treatments / Results  Labs (all labs ordered are listed, but only abnormal results are displayed) Labs Reviewed  URINALYSIS, ROUTINE W REFLEX MICROSCOPIC - Abnormal; Notable for the following components:      Result Value   Hgb urine dipstick SMALL (*)    Ketones, ur 20 (*)    Bacteria, UA RARE (*)    All other components within normal limits  COMPREHENSIVE METABOLIC PANEL - Abnormal; Notable for the following components:   Glucose, Bld 110 (*)    All other components within normal limits  CBC WITH DIFFERENTIAL/PLATELET - Abnormal; Notable for the following components:    WBC 11.8 (*)    Neutro Abs 10.3 (*)    All other components within normal limits  URINE CULTURE  LIPASE, BLOOD    EKG None  Radiology Ct Abdomen Pelvis W Contrast  Result Date: 08/17/2018 CLINICAL DATA:  67 year old male with a history of right lower quadrant pain EXAM: CT ABDOMEN AND PELVIS WITH CONTRAST TECHNIQUE: Multidetector CT imaging of the abdomen and pelvis was performed using the standard protocol following bolus administration of intravenous contrast. CONTRAST:  12mL ISOVUE-300 IOPAMIDOL (ISOVUE-300) INJECTION 61% COMPARISON:  None. FINDINGS: Lower chest: No acute abnormality. Hepatobiliary: Unremarkable appearance of the liver. Unremarkable gallbladder. Pancreas: Unremarkable pancreas Spleen: Unremarkable spleen Adrenals/Urinary Tract: Unremarkable adrenal glands. Right kidney with mild hydronephrosis. Mild dilation of the right ureter. There is a small stone at the right ureterovesical junction measuring 2 mm-3 mm. Mild inflammatory stranding adjacent to the right kidney. No additional stones identified. Perfusion symmetric to the left. Left kidney demonstrates symmetric perfusion to the right. Unremarkable appearance of the left ureter. No hydronephrosis. No nephrolithiasis. Low-density cyst at the lateral cortex of the left kidney compatible with Bosniak 1 cyst. Stomach/Bowel: Hiatal hernia. Unremarkable stomach. Unremarkable small bowel. No abnormal distention. No transition point. Normal appendix. No focal wall thickening of small bowel or colon. Colonic diverticular change. No associated inflammation. Short segment of colon trapped within left inguinal hernia. No transition point. Vascular/Lymphatic: Vascular calcifications of the abdominal aorta. No aneurysm. Bilateral iliac arteries and femoral arteries are patent. No lymphadenopathy. Reproductive: Unremarkable pelvic structures. Other: Small fat containing umbilical hernia. Left inguinal hernia containing short loop of sigmoid  colon. Musculoskeletal: No acute displaced fracture. Degenerative changes of the visualized thoracolumbar spine. Vacuum disc phenomenon at L5-S1. Degenerative changes of the hips. IMPRESSION: Small distal right ureteral stone at the ureterovesical junction with mild hydronephrosis. If there is concern for ascending urinary tract infection, recommend correlation with urinalysis. Diverticular disease without evidence of acute diverticulitis. Left inguinal hernia containing short segment of sigmoid colon. No evidence of obstruction. Hiatal hernia. Aortic Atherosclerosis (ICD10-I70.0). Electronically Signed   By: Corrie Mckusick D.O.   On: 08/17/2018 20:46    Procedures Procedures (including critical care time)  Medications Ordered in ED Medications  iopamidol (ISOVUE-300) 61 % injection (has no administration in time range)  sodium chloride (PF) 0.9 % injection (has no administration in time range)  oxyCODONE-acetaminophen (PERCOCET/ROXICET) 5-325 MG per tablet 1  tablet (has no administration in time range)  fentaNYL (SUBLIMAZE) injection 50 mcg (50 mcg Intravenous Given 08/17/18 1841)  sodium chloride 0.9 % bolus 1,000 mL (1,000 mLs Intravenous New Bag/Given 08/17/18 1841)  iopamidol (ISOVUE-300) 61 % injection 100 mL (100 mLs Intravenous Contrast Given 08/17/18 2019)     Initial Impression / Assessment and Plan / ED Course  I have reviewed the triage vital signs and the nursing notes.  Pertinent labs & imaging results that were available during my care of the patient were reviewed by me and considered in my medical decision making (see chart for details).     67 year old male with a past medical history of hypertension presents to ED for 2-day history of right lower quadrant abdominal pain radiating to right flank and right side of the back.  Reports one episode of nonbloody, nonbilious emesis.  No improvement with one Gas-X.  No history of kidney stones.  Lab work significant for leukocytosis  of 11.8, analysis showing rare bacteria, some red blood cells.  Lipase, CMP unremarkable.  Urine culture pending.  CT of abdomen pelvis shows small distal right ureteral stone at the UVJ with mild hydronephrosis.  Will discharge patient home with pain medication and antiemetics.  We will give him follow-up information for urology if symptoms persist.  Advised to return to ED for any severe worsening symptoms, especially fever. [place] PMP queried with no discrepancies.  Patient is hemodynamically stable, in NAD, and able to ambulate in the ED. Evaluation does not show pathology that would require ongoing emergent intervention or inpatient treatment. I explained the diagnosis to the patient. Pain has been managed and has no complaints prior to discharge. Patient is comfortable with above plan and is stable for discharge at this time. All questions were answered prior to disposition. Strict return precautions for returning to the ED were discussed. Encouraged follow up with PCP.    Portions of this note were generated with [employer]. Dictation errors may occur despite best attempts at proofreading.    Final Clinical Impressions(s) / ED Diagnoses   Final diagnoses:  Ureterolithiasis    ED Discharge Orders         Ordered    oxyCODONE-acetaminophen (PERCOCET/ROXICET) 5-325 MG tablet  Every 4 hours PRN     08/17/18 2128    ondansetron (ZOFRAN ODT) 4 MG disintegrating tablet  Every 8 hours PRN     08/17/18 2128           Delia Heady, PA-C 08/17/18 2128    Lacretia Leigh, MD 08/20/18 1605

## 2018-08-17 NOTE — Telephone Encounter (Signed)
Wife called to report pt.  C/o "severe pain in gut and right side of back."  Wife put pt. On phone to answer triage questions; pt. stated the pain started on Saturday in his right abdomen and went around to the back.  Reported it got better, and he felt okay on Sunday.  Stated he tried to go to work this morning, and had severe pain in the right lower quadrant of abdomen that radiated around to the back, so he turned around and came back home.  Stated the pain is constant and rated at 10/10.  Reported he vomited x 1, and the pain eased a little, but continues to be "severe".  Due to severity of the pain, advised pt. that he will need to go to the ER.  Questioned if he feels he should call 911?  Stated he is about 10 min. from [facility], and his wife will drive him.  Advised to not eat or drink anything, and to go straight to ER.  Verb. Understanding and agreed with plan.     Reason for Disposition . [1] SEVERE pain (e.g., excruciating) AND [2] present > 1 hour  Answer Assessment - Initial Assessment Questions 1. LOCATION: "Where does it hurt?"      Right side 2. RADIATION: "Does the pain shoot anywhere else?" (e.g., chest, back)     To the right back  3. ONSET: "When did the pain begin?" (Minutes, hours or days ago)      Saturday 4. SUDDEN: "Gradual or sudden onset?"     sudden 5. PATTERN "Does the pain come and go, or is it constant?"    - If constant: "Is it getting better, staying the same, or worsening?"      (Note: Constant means the pain never goes away completely; most serious pain is constant and it progresses)     - If intermittent: "How long does it last?" "Do you have pain now?"     (Note: Intermittent means the pain goes away completely between bouts)     Right lower abdomen  6. SEVERITY: "How bad is the pain?"  (e.g., Scale 1-10; mild, moderate, or severe)    - MILD (1-3): doesn't interfere with normal activities, abdomen soft and not tender to touch     - MODERATE (4-7): interferes  with normal activities or awakens from sleep, tender to touch     - SEVERE (8-10): excruciating pain, doubled over, unable to do any normal activities       Severe; 10/10   7. RECURRENT SYMPTOM: "Have you ever had this type of abdominal pain before?" If so, ask: "When was the last time?" and "What happened that time?"      Denied  8. CAUSE: "What do you think is causing the abdominal pain?"     Severe  9. RELIEVING/AGGRAVATING FACTORS: "What makes it better or worse?" (e.g., movement, antacids, bowel movement)    Constant; vomited x one, some ease in pain, but still severe 10. OTHER SYMPTOMS: "Has there been any vomiting, diarrhea, constipation, or urine problems?"       Vomited x 1  Protocols used: ABDOMINAL PAIN - MALE-A-AH

## 2018-08-17 NOTE — Discharge Instructions (Addendum)
If your symptoms do not improve in 5 days, you will need to follow-up with the urologist. Take pain medication as needed.  Take Zofran as needed for nausea. Return to ED if you start to develop a fever, worsening pain, chest pain or shortness of breath.

## 2018-08-17 NOTE — ED Provider Notes (Signed)
Medical screening examination/treatment/procedure(s) were conducted as a shared visit with non-physician practitioner(s) and myself.  I personally evaluated the patient during the encounter.  None 67 year old male presented with right lower quadrant abdominal pain which is been colicky in nature.  CT scan consistent with kidney stone.  Will prescribe pain meds and follow-up given   Lacretia Leigh, MD 08/17/18 2117

## 2018-09-14 ENCOUNTER — Ambulatory Visit (INDEPENDENT_AMBULATORY_CARE_PROVIDER_SITE_OTHER): Payer: Medicare Other

## 2018-09-14 DIAGNOSIS — I639 Cerebral infarction, unspecified: Secondary | ICD-10-CM

## 2018-09-16 NOTE — Progress Notes (Signed)
Carelink Summary Report / Loop Recorder 

## 2018-09-23 DIAGNOSIS — R351 Nocturia: Secondary | ICD-10-CM | POA: Diagnosis not present

## 2018-09-23 DIAGNOSIS — N201 Calculus of ureter: Secondary | ICD-10-CM | POA: Diagnosis not present

## 2018-10-03 LAB — CUP PACEART REMOTE DEVICE CHECK
MDC IDC PG IMPLANT DT: 20190424
MDC IDC SESS DTM: 20191105171023

## 2018-10-08 ENCOUNTER — Other Ambulatory Visit: Payer: Self-pay | Admitting: Internal Medicine

## 2018-10-15 ENCOUNTER — Ambulatory Visit (INDEPENDENT_AMBULATORY_CARE_PROVIDER_SITE_OTHER): Payer: Medicare Other | Admitting: Internal Medicine

## 2018-10-15 ENCOUNTER — Encounter

## 2018-10-15 ENCOUNTER — Encounter: Payer: Self-pay | Admitting: Internal Medicine

## 2018-10-15 VITALS — BP 110/74 | HR 56 | Temp 97.9°F | Ht 75.98 in | Wt 248.5 lb

## 2018-10-15 DIAGNOSIS — I10 Essential (primary) hypertension: Secondary | ICD-10-CM

## 2018-10-15 DIAGNOSIS — K219 Gastro-esophageal reflux disease without esophagitis: Secondary | ICD-10-CM

## 2018-10-15 DIAGNOSIS — G5601 Carpal tunnel syndrome, right upper limb: Secondary | ICD-10-CM | POA: Insufficient documentation

## 2018-10-15 DIAGNOSIS — I63412 Cerebral infarction due to embolism of left middle cerebral artery: Secondary | ICD-10-CM

## 2018-10-15 DIAGNOSIS — Z23 Encounter for immunization: Secondary | ICD-10-CM

## 2018-10-15 DIAGNOSIS — E785 Hyperlipidemia, unspecified: Secondary | ICD-10-CM

## 2018-10-15 DIAGNOSIS — K4090 Unilateral inguinal hernia, without obstruction or gangrene, not specified as recurrent: Secondary | ICD-10-CM | POA: Diagnosis not present

## 2018-10-15 NOTE — Patient Instructions (Addendum)
-  It was nice meeting you today!  -Schedule follow up in September 2020 for your wellness visit. Please come in fasting to that appointment.  -Visit [place] sooner for any acute issues or concerns.

## 2018-10-15 NOTE — Addendum Note (Signed)
Addended by: Westley Hummer B on: 10/15/2018 02:09 PM   Modules accepted: Orders

## 2018-10-15 NOTE — Progress Notes (Signed)
Established Patient Office Visit     CC/Reason for Visit: Establish care, follow-up of chronic medical conditions  HPI: Charles Mccoy is a 68 y.o. male who is coming in today for the above mentioned reasons.  Due for annual wellness visit in September 2020.  Past Medical History is significant for: GERD that is well controlled on Protonix, hypertension that is well controlled on Norvasc, hyperlipidemia that is well controlled on atorvastatin.  In April 2019 he had an MCA infarction for which she received TPA and has had complete neurological recovery.  He was diagnosed with a left inguinal hernia by his urologist and a referral to general surgery is in process.  He has an appointment later this month.  He has also been dealing with right carpal tunnel syndrome that is progressing despite use of wrist splint.  He has numbness and tingling of his thumb, pointer and middle finger.   Past Medical/Surgical History: Past Medical History:  Diagnosis Date  . ALLERGIC RHINITIS 06/19/2007  . ASTHMA 08/17/2008  . Back pain 05/2016  . COPD (chronic obstructive pulmonary disease) ([place])   . GERD (gastroesophageal reflux disease)   . HYPERTENSION 06/19/2007  . Hypertension    meds controlling well  . OSTEOARTHRITIS 10/09/2009   wrists, knee.  . Pneumonia    hx of   . SINUSITIS 09/15/2007   recent issuses 2 weeks ago-clearing.  . Stroke [facility])     Past Surgical History:  Procedure Laterality Date  . cyst removed from neck      age 66   . KNEE ARTHROSCOPY     2 on left knee , 1 on right  . LOOP RECORDER INSERTION N/A 01/28/2018   Procedure: LOOP RECORDER INSERTION;  Surgeon: Evans Lance, MD;  Location: [place] [facility];  Service: Cardiovascular;  Laterality: N/A;  . TEE WITHOUT CARDIOVERSION N/A 01/28/2018   Procedure: TRANSESOPHAGEAL ECHOCARDIOGRAM (TEE);  Surgeon: Acie Fredrickson Wonda Cheng, MD;  Location: [facility] ENDOSCOPY;  Service: Cardiovascular;  Laterality: N/A;  . TOTAL KNEE ARTHROPLASTY Left  07/21/2014   Procedure: LEFT TOTAL KNEE ARTHROPLASTY;  Surgeon: Johnn Hai, MD;  Location: [facility] ORS;  Service: Orthopedics;  Laterality: Left;  . TOTAL KNEE ARTHROPLASTY Right 05/30/2016   Procedure: RIGHT TOTAL KNEE ARTHROPLASTY REPLACEMENT;  Surgeon: Susa Day, MD;  Location: [facility] ORS;  Service: Orthopedics;  Laterality: Right;    Social History:  reports that he quit smoking about 17 years ago. His smoking use included cigarettes. He has never used smokeless tobacco. He reports current alcohol use of about 6.0 standard drinks of alcohol per week. He reports that he does not use drugs.  Allergies: Allergies  Allergen Reactions  . Erythromycin Base Other (See Comments)    REACTION: stomach irritation  . Penicillins Other (See Comments)    Childhood Allergy Has patient had a PCN reaction causing immediate rash, facial/tongue/throat swelling, SOB or lightheadedness with hypotension: Unknown Has patient had a PCN reaction causing severe rash involving mucus membranes or skin necrosis: Unknown Has patient had a PCN reaction that required hospitalization: Unknown Has patient had a PCN reaction occurring within the last 10 years: Unknown If all of the above answers are "NO", then may proceed with Cephalosporin use.     Family History:  Family History  Problem Relation Age of Onset  . Colon cancer Neg Hx   . Esophageal cancer Neg Hx   . Rectal cancer Neg Hx   . Stomach cancer Neg Hx  Current Outpatient Medications:  .  amLODipine (NORVASC) 10 MG tablet, TAKE 1 TABLET BY MOUTH EVERY DAY IN THE MORNING, Disp: 90 tablet, Rfl: 4 .  aspirin 81 MG EC tablet, Take 1 tablet (81 mg total) by mouth daily., Disp: 90 tablet, Rfl: 3 .  atorvastatin (LIPITOR) 40 MG tablet, Take 1 tablet (40 mg total) by mouth daily at 6 PM., Disp: 90 tablet, Rfl: 3 .  azelastine (ASTELIN) 0.1 % nasal spray, Place 1 spray into both nostrils at bedtime., Disp: , Rfl: 5 .  fexofenadine (ALLEGRA) 60 MG tablet,  Take 60 mg by mouth daily., Disp: , Rfl:  .  Fluticasone-Salmeterol 232-14 MCG/ACT AEPB, Inhale 1 puff into the lungs 2 (two) times daily., Disp: , Rfl: 5 .  pantoprazole (PROTONIX) 40 MG tablet, Take 1 tablet (40 mg total) by mouth daily., Disp: 90 tablet, Rfl: 3 .  spironolactone (ALDACTONE) 25 MG tablet, Take 1 tablet (25 mg total) by mouth daily., Disp: 90 tablet, Rfl: 4  Review of Systems:  Constitutional: Denies fever, chills, diaphoresis, appetite change and fatigue.  HEENT: Denies photophobia, eye pain, redness, hearing loss, ear pain, congestion, sore throat, rhinorrhea, sneezing, mouth sores, trouble swallowing, neck pain, neck stiffness and tinnitus.   Respiratory: Denies SOB, DOE, cough, chest tightness,  and wheezing.   Cardiovascular: Denies chest pain, palpitations and leg swelling.  Gastrointestinal: Denies nausea, vomiting, abdominal pain, diarrhea, constipation, blood in stool and abdominal distention.  Genitourinary: Denies dysuria, urgency, frequency, hematuria, flank pain and difficulty urinating.  Endocrine: Denies: hot or cold intolerance, sweats, changes in hair or nails, polyuria, polydipsia. Musculoskeletal: Denies myalgias, back pain, joint swelling, arthralgias and gait problem.  Skin: Denies pallor, rash and wound.  Neurological: Denies dizziness, seizures, syncope, weakness, light-headedness, numbness and headaches.  Hematological: Denies adenopathy. Easy bruising, personal or family bleeding history  Psychiatric/Behavioral: Denies suicidal ideation, mood changes, confusion, nervousness, sleep disturbance and agitation    Physical Exam: Vitals:   10/15/18 1332  BP: 110/74  Pulse: (!) 56  Temp: 97.9 F (36.6 C)  TempSrc: Oral  SpO2: 96%  Weight: 248 lb 8 oz (112.7 kg)  Height: 6' 3.98" (1.93 m)    Body mass index is 30.26 kg/m.   Constitutional: NAD, calm, comfortable Eyes: PERRL, lids and conjunctivae normal ENMT: Mucous membranes are moist.  Posterior pharynx clear of any exudate or lesions. Normal dentition. Tympanic membrane is pearly white, no erythema or bulging. Neck: normal, supple, no masses, no thyromegaly Respiratory: clear to auscultation bilaterally, no wheezing, no crackles. Normal respiratory effort. No accessory muscle use.  Cardiovascular: Regular rate and rhythm, no murmurs / rubs / gallops. No extremity edema. 2+ pedal pulses. No carotid bruits.  Abdomen: no tenderness, no masses palpated. No hepatosplenomegaly. Bowel sounds positive.  Musculoskeletal: no clubbing / cyanosis. No joint deformity upper and lower extremities. Good ROM, no contractures. Normal muscle tone.  Skin: no rashes, lesions, ulcers. No induration Neurologic: CN 2-12 grossly intact. Sensation intact, DTR normal. Strength 5/5 in all 4.  Psychiatric: Normal judgment and insight. Alert and oriented x 3. Normal mood.    Impression and Plan:  Essential hypertension -Well-controlled on amlodipine.  Right carpal tunnel syndrome -He has already seen his orthopedist Dr. Tonita Cong, he will follow-up with him due to progression of symptoms despite wrist splint..  Left inguinal hernia -Referral to general surgery is already in place and he has appointment later this month to discuss options. -This is reducible and does not cause pain.  Gastroesophageal reflux disease  without esophagitis -Symptoms well controlled on Protonix.  Cerebral infarction due to embolism of left middle cerebral artery ([place]) s/p tPA -In April 2019, he has had complete neurological recovery  Hyperlipidemia, unspecified hyperlipidemia type -Last LDL was 63 in September 2019, he is on atorvastatin 40 mg.    Patient Instructions  -It was nice meeting you today!  -Schedule follow up in September 2020 for your wellness visit. Please come in fasting to that appointment.  -Visit [place] sooner for any acute issues or concerns.     Lelon Frohlich, MD [facility]  at [facility]

## 2018-10-16 ENCOUNTER — Ambulatory Visit (INDEPENDENT_AMBULATORY_CARE_PROVIDER_SITE_OTHER): Payer: Medicare Other

## 2018-10-16 DIAGNOSIS — I639 Cerebral infarction, unspecified: Secondary | ICD-10-CM

## 2018-10-17 LAB — CUP PACEART REMOTE DEVICE CHECK
Date Time Interrogation Session: 20200110183728
Implantable Pulse Generator Implant Date: 20190424

## 2018-10-19 NOTE — Progress Notes (Signed)
Carelink Summary Report / Loop Recorder 

## 2018-10-22 ENCOUNTER — Encounter: Payer: Self-pay | Admitting: Internal Medicine

## 2018-10-25 LAB — CUP PACEART REMOTE DEVICE CHECK
Date Time Interrogation Session: 20191208173615
Implantable Pulse Generator Implant Date: 20190424

## 2018-10-26 ENCOUNTER — Telehealth: Payer: Self-pay

## 2018-10-26 NOTE — Telephone Encounter (Signed)
Spoke with patient. He reports he was asleep and asymptomatic with pause episode. Going forward, patient agrees to call the [facility] for any dizzy spells or syncopal episodes as previous nocturnal pause episodes have also been asymptomatic LINQ was implanted for cryptogenic stroke. Patient aware of DC phone number for symptoms.    Noted in [place].

## 2018-10-26 NOTE — Telephone Encounter (Signed)
LVM for return call to discuss Linq transmission. Pt had a sinus pause 1/18 @ 0553 lasting 5 sec. Need to clarify if pt was symptomatic.

## 2018-10-29 DIAGNOSIS — K4090 Unilateral inguinal hernia, without obstruction or gangrene, not specified as recurrent: Secondary | ICD-10-CM | POA: Diagnosis not present

## 2018-10-29 DIAGNOSIS — Z8673 Personal history of transient ischemic attack (TIA), and cerebral infarction without residual deficits: Secondary | ICD-10-CM | POA: Diagnosis not present

## 2018-11-09 DIAGNOSIS — K4090 Unilateral inguinal hernia, without obstruction or gangrene, not specified as recurrent: Secondary | ICD-10-CM | POA: Diagnosis not present

## 2018-11-09 DIAGNOSIS — D176 Benign lipomatous neoplasm of spermatic cord: Secondary | ICD-10-CM | POA: Diagnosis not present

## 2018-11-18 ENCOUNTER — Ambulatory Visit (INDEPENDENT_AMBULATORY_CARE_PROVIDER_SITE_OTHER): Payer: Medicare Other

## 2018-11-18 DIAGNOSIS — I639 Cerebral infarction, unspecified: Secondary | ICD-10-CM | POA: Diagnosis not present

## 2018-11-19 LAB — CUP PACEART REMOTE DEVICE CHECK
Date Time Interrogation Session: 20200212183854
MDC IDC PG IMPLANT DT: 20190424

## 2018-12-01 NOTE — Progress Notes (Signed)
Carelink Summary Report / Loop Recorder 

## 2018-12-15 ENCOUNTER — Ambulatory Visit: Payer: Medicare Other | Admitting: Adult Health

## 2018-12-21 ENCOUNTER — Other Ambulatory Visit: Payer: Self-pay

## 2018-12-21 ENCOUNTER — Ambulatory Visit (INDEPENDENT_AMBULATORY_CARE_PROVIDER_SITE_OTHER): Payer: Medicare Other | Admitting: *Deleted

## 2018-12-21 DIAGNOSIS — D3131 Benign neoplasm of right choroid: Secondary | ICD-10-CM | POA: Diagnosis not present

## 2018-12-21 DIAGNOSIS — I639 Cerebral infarction, unspecified: Secondary | ICD-10-CM

## 2018-12-22 ENCOUNTER — Other Ambulatory Visit: Payer: Self-pay | Admitting: Internal Medicine

## 2018-12-22 LAB — CUP PACEART REMOTE DEVICE CHECK
Date Time Interrogation Session: 20200316193937
Implantable Pulse Generator Implant Date: 20190424

## 2018-12-29 NOTE — Progress Notes (Signed)
Carelink Summary Report / Loop Recorder 

## 2019-01-25 ENCOUNTER — Ambulatory Visit (INDEPENDENT_AMBULATORY_CARE_PROVIDER_SITE_OTHER): Payer: Medicare Other | Admitting: *Deleted

## 2019-01-25 ENCOUNTER — Other Ambulatory Visit: Payer: Self-pay

## 2019-01-25 DIAGNOSIS — I639 Cerebral infarction, unspecified: Secondary | ICD-10-CM

## 2019-01-25 LAB — CUP PACEART REMOTE DEVICE CHECK
Date Time Interrogation Session: 20200418201215
Implantable Pulse Generator Implant Date: 20190424

## 2019-02-03 ENCOUNTER — Telehealth: Payer: Self-pay

## 2019-02-03 NOTE — Progress Notes (Signed)
Carelink Summary Report / Loop Recorder 

## 2019-02-03 NOTE — Telephone Encounter (Signed)
I called patient that we are changing to video visits. I stated we need verbal consent to do video and file insurance. This is due to COVID 19. I updated chart and pharmacy information and medication list.Pt stated he has a lap top and smart phone. He stated the lap top has a video and audio that works.  I will send you a email that will have a link for you to click on to join the [facility].You will need to log on 5 10 minutes prior to your visit. It will ask to type your first and last name.  You will see provider and yourself in the camera.Pt verbalized understanding.

## 2019-02-08 NOTE — Telephone Encounter (Signed)
Email sent to [email or web address] for doxy video visit tomorrow 02/09/2019.

## 2019-02-09 ENCOUNTER — Ambulatory Visit (INDEPENDENT_AMBULATORY_CARE_PROVIDER_SITE_OTHER): Payer: Medicare Other | Admitting: Adult Health

## 2019-02-09 ENCOUNTER — Other Ambulatory Visit: Payer: Self-pay

## 2019-02-09 ENCOUNTER — Encounter: Payer: Self-pay | Admitting: Adult Health

## 2019-02-09 VITALS — BP 140/75 | HR 66 | Wt 246.0 lb

## 2019-02-09 DIAGNOSIS — E785 Hyperlipidemia, unspecified: Secondary | ICD-10-CM

## 2019-02-09 DIAGNOSIS — I10 Essential (primary) hypertension: Secondary | ICD-10-CM

## 2019-02-09 DIAGNOSIS — I63412 Cerebral infarction due to embolism of left middle cerebral artery: Secondary | ICD-10-CM | POA: Diagnosis not present

## 2019-02-09 NOTE — Progress Notes (Signed)
[facility] [address] [place]. [place] [address] [phone]       FOLLOW UP NOTE  [name] Date of Birth:  Sep 08, 1951 Medical Record Number:  [id number]   Reason for visit: Stroke follow up  Virtual Visit via Video Note  I connected with Charles Mccoy on 02/09/19 at  3:45 PM EDT by a video enabled telemedicine application located remotely in my own home and verified that I am speaking with the correct person using two identifiers who was located at their own home.   I discussed the limitations of evaluation and management by telemedicine and the availability of in person appointments. The patient expressed understanding and agreed to proceed.   HPI: Charles Mccoy is a 68 y.o. male with underlying medical history of HTN, HLD and left MCA infarct in 01/2018.  He was initially scheduled today for face-to-face office stroke follow-up visit but due to COVID-19 safety precautions, visit transition to telemedicine via [email or web address] with patient's consent.  He has been stable from a stroke standpoint without residual deficits or reoccurring/new symptoms. 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 He has continued to keep active despite being out of work. He continues on aspirin 81 mg and atorvastatin 40 mg without reported side effects.  Lipid panel 06/09/2018 showed LDL 63.  Blood pressure 140/75 66.  Loop recorder has not shown atrial fibrillation thus far. He did have hernia surgery in 11/2018 recovering well.  No further concerns at this time.    ROS:   14 system review of systems performed and negative with exception of no complaints  PMH:  Past Medical History:  Diagnosis Date  . ALLERGIC RHINITIS 06/19/2007  . ASTHMA 08/17/2008  . Back pain 05/2016  . COPD (chronic obstructive pulmonary disease) ([place])   . GERD (gastroesophageal reflux disease)   . HYPERTENSION 06/19/2007  . Hypertension    meds controlling well  .  OSTEOARTHRITIS 10/09/2009   wrists, knee.  . Pneumonia    hx of   . SINUSITIS 09/15/2007   recent issuses 2 weeks ago-clearing.  . Stroke [facility])     PSH:  Past Surgical History:  Procedure Laterality Date  . cyst removed from neck      age 77   . KNEE ARTHROSCOPY     2 on left knee , 1 on right  . LOOP RECORDER INSERTION N/A 01/28/2018   Procedure: LOOP RECORDER INSERTION;  Surgeon: Evans Lance, MD;  Location: [place] [facility] LAB;  Service: Cardiovascular;  Laterality: N/A;  . TEE WITHOUT CARDIOVERSION N/A 01/28/2018   Procedure: TRANSESOPHAGEAL ECHOCARDIOGRAM (TEE);  Surgeon: Acie Fredrickson Wonda Cheng, MD;  Location: [facility] ENDOSCOPY;  Service: Cardiovascular;  Laterality: N/A;  . TOTAL KNEE ARTHROPLASTY Left 07/21/2014   Procedure: LEFT TOTAL KNEE ARTHROPLASTY;  Surgeon: Johnn Hai, MD;  [facility]: [facility];  Service: Orthopedics;  Laterality: Left;  . TOTAL KNEE ARTHROPLASTY Right 05/30/2016   Procedure: RIGHT TOTAL KNEE ARTHROPLASTY REPLACEMENT;  Surgeon: Susa Day, MD;  Location: [facility];  Service: Orthopedics;  Laterality: Right;    Social History:  Social History   Socioeconomic History  . Marital status: Married    Spouse name: Not on file  . Number of children: Not on file  . Years of education: Not on file  . Highest education level: Not on file  Occupational History  . Not on file  Social Needs  . Financial resource strain: Not on file  .  Food insecurity:    Worry: Not on file    Inability: Not on file  . Transportation needs:    Medical: Not on file    Non-medical: Not on file  Tobacco Use  . Smoking status: Former Smoker    Types: Cigarettes    Last attempt to quit: 10/07/2001    Years since quitting: 17.3  . Smokeless tobacco: Never Used  Substance and Sexual Activity  . Alcohol use: Yes    Alcohol/week: 6.0 standard drinks    Types: 6 Cans of beer per week    Comment: beer daily  . Drug use: No  . Sexual activity: Yes  Lifestyle  . Physical activity:     Days per week: Not on file    Minutes per session: Not on file  . Stress: Not on file  Relationships  . Social connections:    Talks on phone: Not on file    Gets together: Not on file    Attends religious service: Not on file    Active member of club or organization: Not on file    Attends meetings of clubs or organizations: Not on file    Relationship status: Not on file  . Intimate partner violence:    Fear of current or ex partner: Not on file    Emotionally abused: Not on file    Physically abused: Not on file    Forced sexual activity: Not on file  Other Topics Concern  . Not on file  Social History Narrative  . Not on file    Family History:  Family History  Problem Relation Age of Onset  . Colon cancer Neg Hx   . Esophageal cancer Neg Hx   . Rectal cancer Neg Hx   . Stomach cancer Neg Hx     Medications:   Current Outpatient Medications on File Prior to Visit  Medication Sig Dispense Refill  . amLODipine (NORVASC) 10 MG tablet TAKE 1 TABLET BY MOUTH EVERY DAY IN THE MORNING 90 tablet 4  . aspirin 81 MG EC tablet Take 1 tablet (81 mg total) by mouth daily. 90 tablet 3  . atorvastatin (LIPITOR) 40 MG tablet Take 1 tablet (40 mg total) by mouth daily at 6 PM. 90 tablet 3  . azelastine (ASTELIN) 0.1 % nasal spray Place 1 spray into both nostrils at bedtime.  5  . fexofenadine (ALLEGRA) 60 MG tablet Take 60 mg by mouth daily.    . Fluticasone-Salmeterol 232-14 MCG/ACT AEPB Inhale 1 puff into the lungs 2 (two) times daily.  5  . pantoprazole (PROTONIX) 40 MG tablet Take 1 tablet (40 mg total) by mouth daily. 90 tablet 3  . spironolactone (ALDACTONE) 25 MG tablet Take 1 tablet (25 mg total) by mouth daily. 90 tablet 4   No current facility-administered medications on file prior to visit.     Allergies:   Allergies  Allergen Reactions  . Erythromycin Base Other (See Comments)    REACTION: stomach irritation  . Penicillins Other (See Comments)    Childhood Allergy  Has patient had a PCN reaction causing immediate rash, facial/tongue/throat swelling, SOB or lightheadedness with hypotension: Unknown Has patient had a PCN reaction causing severe rash involving mucus membranes or skin necrosis: Unknown Has patient had a PCN reaction that required hospitalization: Unknown Has patient had a PCN reaction occurring within the last 10 years: Unknown If all of the above answers are "NO", then may proceed with Cephalosporin use.  Physical Exam  Vitals:   02/09/19 1527  BP: 140/75  Pulse: 66  Weight: 246 lb (111.6 kg)   Body mass index is 29.96 kg/m. No exam data present  General: well developed, pleasant middle-aged Caucasian male, well nourished, seated, in no evident distress Head: head normocephalic and atraumatic.    Neurologic Exam Mental Status: Awake and fully alert. Oriented to place and time. Recent and remote memory intact. Attention span, concentration and fund of knowledge appropriate. Mood and affect appropriate.  Cranial Nerves: Extraocular movements full without nystagmus. Hearing intact to voice. Facial sensation intact. Face, tongue, palate moves normally and symmetrically.  Motor: No evidence of weakness per drift assessment Sensory.: intact to touch Coordination: Rapid alternating movements normal in all extremities. Finger-to-nose and heel-to-shin performed accurately bilaterally. Gait and Station: Arises from chair without difficulty. Stance is normal. Gait demonstrates normal stride length and balance . Able to heel, toe and tandem walk without difficulty.  Reflexes: UTA     Diagnostic Data (Labs, Imaging, Testing)  CT HEAD WO CONTRAST 01/18/2018 IMPRESSION: 1. No acute finding.  ASPECTS is 10. 2. Moderate chronic small vessel ischemia in the cerebral white Matter.  MR BRAIN WO CONTRAST 01/19/2018 IMPRESSION: 1. Scattered small acute/subacute nonhemorrhagic cortical infarcts in the posterior left frontal lobe and  anterior left parietal lobe near the vertex. These correspond with the patient's abrupt onset of right upper extremity weakness and numbness. 2. Subjacent white matter restricted diffusion corresponds with the cortical spinal tracts. 3. Atrophy and white matter disease in addition to these areas is moderately advanced for age. This likely reflects the sequela of chronic microvascular ischemia.  MR MRA HEAD WO CONTRAST 01/19/2018 IMPRESSION: Normal variant MRA circle of Willis without significant proximal stenosis, aneurysm, or branch vessel occlusion.  ECHOCARDIOGRAM 01/19/2018 Study Conclusions - Left ventricle: The cavity size was moderately dilated. Systolic   function was normal. The estimated ejection fraction was in the   range of 60% to 65%. Wall motion was normal; there were no   regional wall motion abnormalities. Left ventricular diastolic   function parameters were normal. - Aortic valve: There was mild regurgitation. - Mitral valve: Calcified annulus. - Left atrium: The atrium was mildly dilated. - Right ventricle: The cavity size was moderately dilated. Wall   thickness was normal. - Atrial septum: There was increased thickness of the septum,   consistent with lipomatous hypertrophy.  VAS US CAROTID DUPLEX BILATERAL 01/19/18 Final Interpretation: Right Carotid: Velocities in the right ICA are consistent with a 1-39% stenosis. Left Carotid: Velocities in the left ICA are consistent with a 1-39% stenosis. Vertebrals: Bilateral vertebral arteries demonstrate antegrade flow.  ECHO TEE 01/28/2018 Impressions: - Normal LV function   The ascending aorta was mildly dilated.   no PFO or ASD seen.   There was a late appearing bubble in the LA that was likely from   a pulmonary AVM     ASSESSMENT: Ridwan Bondy is a 68 y.o. year old male here with left MCA infarcts on 01/18/18 secondary to possibly embolic with unknown source. Vascular risk factors include HLD and HTN.   He has been stable from a stroke standpoint without residual deficits or reoccurring of symptoms.   PLAN: -Continue aspirin 81 mg daily  and Lipitor for secondary stroke prevention -F/u with PCP regarding your HTN and HLD management -continue to monitor BP at home -Continue to monitor loop recorder -Advised to continue to stay active and maintain a healthy diet -Maintain strict control of hypertension with  blood pressure goal below 130/90, diabetes with hemoglobin A1c goal below 6.5% and cholesterol with LDL cholesterol (bad cholesterol) goal below 70 mg/dL. I also advised the patient to eat a healthy diet with plenty of whole grains, cereals, fruits and vegetables, exercise regularly and maintain ideal body weight.  Stable from a stroke standpoint and recommend follow-up as needed   Greater than 50% of time during this 25 minute non-face-to-face visit was spent on counseling,explanation of diagnosis of left MCA, reviewing risk factor management of HLD and HTN, planning of further management, discussion with patient and family and coordination of care   Venancio Poisson, [facility]  [facility] [address] [place], [place] [address]  Phone [phone] Fax [phone]

## 2019-02-09 NOTE — Progress Notes (Signed)
I agree with the above plan 

## 2019-02-25 ENCOUNTER — Ambulatory Visit (INDEPENDENT_AMBULATORY_CARE_PROVIDER_SITE_OTHER): Payer: Medicare Other | Admitting: *Deleted

## 2019-02-25 DIAGNOSIS — I639 Cerebral infarction, unspecified: Secondary | ICD-10-CM | POA: Diagnosis not present

## 2019-02-26 LAB — CUP PACEART REMOTE DEVICE CHECK
Date Time Interrogation Session: 20200521200708
Implantable Pulse Generator Implant Date: 20190424

## 2019-03-04 DIAGNOSIS — M7662 Achilles tendinitis, left leg: Secondary | ICD-10-CM | POA: Diagnosis not present

## 2019-03-04 DIAGNOSIS — M7661 Achilles tendinitis, right leg: Secondary | ICD-10-CM | POA: Diagnosis not present

## 2019-03-04 DIAGNOSIS — M7731 Calcaneal spur, right foot: Secondary | ICD-10-CM | POA: Diagnosis not present

## 2019-03-04 DIAGNOSIS — M7732 Calcaneal spur, left foot: Secondary | ICD-10-CM | POA: Diagnosis not present

## 2019-03-08 NOTE — Progress Notes (Signed)
Carelink Summary Report / Loop Recorder 

## 2019-03-11 DIAGNOSIS — M7662 Achilles tendinitis, left leg: Secondary | ICD-10-CM | POA: Diagnosis not present

## 2019-03-11 DIAGNOSIS — M7661 Achilles tendinitis, right leg: Secondary | ICD-10-CM | POA: Diagnosis not present

## 2019-03-17 DIAGNOSIS — M7662 Achilles tendinitis, left leg: Secondary | ICD-10-CM | POA: Diagnosis not present

## 2019-03-17 DIAGNOSIS — M7661 Achilles tendinitis, right leg: Secondary | ICD-10-CM | POA: Diagnosis not present

## 2019-03-30 ENCOUNTER — Ambulatory Visit (INDEPENDENT_AMBULATORY_CARE_PROVIDER_SITE_OTHER): Payer: Medicare Other | Admitting: *Deleted

## 2019-03-30 DIAGNOSIS — I639 Cerebral infarction, unspecified: Secondary | ICD-10-CM | POA: Diagnosis not present

## 2019-03-31 LAB — CUP PACEART REMOTE DEVICE CHECK
Date Time Interrogation Session: 20200623204009
Implantable Pulse Generator Implant Date: 20190424

## 2019-04-12 NOTE — Progress Notes (Signed)
Carelink Summary Report / Loop Recorder 

## 2019-04-28 ENCOUNTER — Encounter: Payer: Self-pay | Admitting: Internal Medicine

## 2019-04-29 ENCOUNTER — Encounter: Payer: Self-pay | Admitting: Internal Medicine

## 2019-04-29 ENCOUNTER — Ambulatory Visit
Admission: RE | Admit: 2019-04-29 | Discharge: 2019-04-29 | Disposition: A | Discharge status: Home / Self Care | Payer: Medicare Other | Source: Ambulatory Visit | Attending: Internal Medicine | Admitting: Internal Medicine

## 2019-04-29 ENCOUNTER — Ambulatory Visit (INDEPENDENT_AMBULATORY_CARE_PROVIDER_SITE_OTHER): Payer: Medicare Other | Admitting: Internal Medicine

## 2019-04-29 ENCOUNTER — Other Ambulatory Visit: Payer: Self-pay

## 2019-04-29 ENCOUNTER — Other Ambulatory Visit: Payer: Self-pay | Admitting: Internal Medicine

## 2019-04-29 VITALS — BP 120/80 | HR 62 | Temp 98.7°F | Ht 74.25 in | Wt 243.5 lb

## 2019-04-29 DIAGNOSIS — R079 Chest pain, unspecified: Secondary | ICD-10-CM

## 2019-04-29 DIAGNOSIS — R918 Other nonspecific abnormal finding of lung field: Secondary | ICD-10-CM | POA: Insufficient documentation

## 2019-04-29 DIAGNOSIS — I63412 Cerebral infarction due to embolism of left middle cerebral artery: Secondary | ICD-10-CM | POA: Diagnosis not present

## 2019-04-29 DIAGNOSIS — R072 Precordial pain: Secondary | ICD-10-CM | POA: Diagnosis not present

## 2019-04-29 LAB — BASIC METABOLIC PANEL
BUN: 14 mg/dL (ref 6–23)
CO2: 27 mEq/L (ref 19–32)
Calcium: 9.2 mg/dL (ref 8.4–10.5)
Chloride: 102 mEq/L (ref 96–112)
Creatinine, Ser: 0.95 mg/dL (ref 0.40–1.50)
GFR: 79.29 mL/min (ref >60.00)
Glucose, Bld: 106 mg/dL — ABNORMAL HIGH (ref 70–99)
Potassium: 4.1 mEq/L (ref 3.5–5.1)
Sodium: 138 mEq/L (ref 135–145)

## 2019-04-29 MED ORDER — AMLODIPINE BESYLATE 10 MG PO TABS: ORAL_TABLET | ORAL | 1 refills | Status: DC

## 2019-04-29 MED ORDER — SPIRONOLACTONE 25 MG PO TABS: 25.0000 mg | ORAL_TABLET | Freq: Every day | ORAL | 1 refills | Status: DC

## 2019-04-29 MED ORDER — PANTOPRAZOLE SODIUM 40 MG PO TBEC: 40.0000 mg | DELAYED_RELEASE_TABLET | Freq: Every day | ORAL | 1 refills | Status: DC

## 2019-04-29 MED ORDER — IOPAMIDOL (ISOVUE-370) INJECTION 76%
75.0000 mL | Freq: Once | INTRAVENOUS | Status: AC | PRN
  Administered 2019-04-29: 75 mL via INTRAVENOUS

## 2019-04-29 NOTE — Progress Notes (Signed)
Established Patient Office Visit     CC/Reason for Visit: severe upper back and chest pain  HPI: Charles Mccoy is a 68 y.o. male who is coming in today for the above mentioned reasons. Past Medical History is significant for: Hypertension that has been well controlled on Norvasc, hyperlipidemia on atorvastatin, history of an MCA CVA in April 2019 for which he received TPA and has had complete neurologic recovery.  He also has right carpal tunnel syndrome followed by orthopedics and a left inguinal hernia that is not causing significant issues.  He states about 10 days ago for 2 days in a row he was power washing his house.  Subsequent to this started developing some severe back pain in between his shoulder blades.  He thought it was a muscle spasm and went to a chiropractor who adjusted him and he felt better.  Pain recurred so he went back and again had a short relief after a second adjustment.  He states the pain starts in the center of his shoulder blades and radiates forward around his nipple area on both sides.  Also radiates up into the neck.  He has had 6 episodes of this over the past week and states that each time it is increasing in intensity.  Most times he is resting when the pain starts.  The most severe episode was the night before last.  When it occurred he checked his blood pressure and noticed that on the right arm his systolic blood pressure was 200 and on the left arm systolic blood pressure was 180.  No dizziness, lightheadedness, palpitations but does endorse some shortness of breath and "anxiety" while he is having this pain.  He is a former smoker, quit about 20 years ago.   Past Medical/Surgical History: Past Medical History:  Diagnosis Date  . ALLERGIC RHINITIS 06/19/2007  . ASTHMA 08/17/2008  . Back pain 05/2016  . COPD (chronic obstructive pulmonary disease) ([place])   . GERD (gastroesophageal reflux disease)   . HYPERTENSION 06/19/2007  . Hypertension    meds  controlling well  . OSTEOARTHRITIS 10/09/2009   wrists, knee.  . Pneumonia    hx of   . SINUSITIS 09/15/2007   recent issuses 2 weeks ago-clearing.  . Stroke [facility])     Past Surgical History:  Procedure Laterality Date  . cyst removed from neck      age 17   . KNEE ARTHROSCOPY     2 on left knee , 1 on right  . LOOP RECORDER INSERTION N/A 01/28/2018   Procedure: LOOP RECORDER INSERTION;  Surgeon: Evans Lance, MD;  Location: [facility];  Service: Cardiovascular;  Laterality: N/A;  . TEE WITHOUT CARDIOVERSION N/A 01/28/2018   Procedure: TRANSESOPHAGEAL ECHOCARDIOGRAM (TEE);  Surgeon: Acie Fredrickson Wonda Cheng, MD;  Location: [facility] ENDOSCOPY;  Service: Cardiovascular;  Laterality: N/A;  . TOTAL KNEE ARTHROPLASTY Left 07/21/2014   Procedure: LEFT TOTAL KNEE ARTHROPLASTY;  Surgeon: Johnn Hai, MD;  Location: [facility] ORS;  Service: Orthopedics;  Laterality: Left;  . TOTAL KNEE ARTHROPLASTY Right 05/30/2016   Procedure: RIGHT TOTAL KNEE ARTHROPLASTY REPLACEMENT;  Surgeon: Susa Day, MD;  Location: [facility] ORS;  Service: Orthopedics;  Laterality: Right;    Social History:  reports that he quit smoking about 17 years ago. His smoking use included cigarettes. He has never used smokeless tobacco. He reports current alcohol use of about 6.0 standard drinks of alcohol per week. He reports that he does not use drugs.  Allergies: Allergies  Allergen Reactions  . Erythromycin Base Other (See Comments)    REACTION: stomach irritation  . Penicillins Other (See Comments)    Childhood Allergy Has patient had a PCN reaction causing immediate rash, facial/tongue/throat swelling, SOB or lightheadedness with hypotension: Unknown Has patient had a PCN reaction causing severe rash involving mucus membranes or skin necrosis: Unknown Has patient had a PCN reaction that required hospitalization: Unknown Has patient had a PCN reaction occurring within the last 10 years: Unknown If all of the above answers are  "NO", then may proceed with Cephalosporin use.     Family History:  Family History  Problem Relation Age of Onset  . Colon cancer Neg Hx   . Esophageal cancer Neg Hx   . Rectal cancer Neg Hx   . Stomach cancer Neg Hx      Current Outpatient Medications:  .  amLODipine (NORVASC) 10 MG tablet, TAKE 1 TABLET BY MOUTH EVERY DAY IN THE MORNING, Disp: 90 tablet, Rfl: 1 .  aspirin 81 MG EC tablet, Take 1 tablet (81 mg total) by mouth daily., Disp: 90 tablet, Rfl: 3 .  atorvastatin (LIPITOR) 40 MG tablet, Take 1 tablet (40 mg total) by mouth daily at 6 PM., Disp: 90 tablet, Rfl: 3 .  azelastine (ASTELIN) 0.1 % nasal spray, Place 1 spray into both nostrils at bedtime., Disp: , Rfl: 5 .  fexofenadine (ALLEGRA) 60 MG tablet, Take 60 mg by mouth daily., Disp: , Rfl:  .  Fluticasone-Salmeterol 232-14 MCG/ACT AEPB, Inhale 1 puff into the lungs 2 (two) times daily., Disp: , Rfl: 5 .  pantoprazole (PROTONIX) 40 MG tablet, Take 1 tablet (40 mg total) by mouth daily., Disp: 90 tablet, Rfl: 1 .  spironolactone (ALDACTONE) 25 MG tablet, Take 1 tablet (25 mg total) by mouth daily., Disp: 90 tablet, Rfl: 1  Review of Systems:  Constitutional: Denies fever, chills, diaphoresis, appetite change and fatigue.  HEENT: Denies photophobia, eye pain, redness, hearing loss, ear pain, congestion, sore throat, rhinorrhea, sneezing, mouth sores, trouble swallowing, neck pain, neck stiffness and tinnitus.   Respiratory: Denies  DOE, cough, chest tightness,  and wheezing.   Cardiovascular: Denies  palpitations and leg swelling.  Gastrointestinal: Denies nausea, vomiting, abdominal pain, diarrhea, constipation, blood in stool and abdominal distention.  Genitourinary: Denies dysuria, urgency, frequency, hematuria, flank pain and difficulty urinating.  Endocrine: Denies: hot or cold intolerance, sweats, changes in hair or nails, polyuria, polydipsia. Musculoskeletal: Denies myalgias, back pain, joint swelling, arthralgias  and gait problem.  Skin: Denies pallor, rash and wound.  Neurological: Denies dizziness, seizures, syncope, weakness, light-headedness, numbness and headaches.  Hematological: Denies adenopathy. Easy bruising, personal or family bleeding history  Psychiatric/Behavioral: Denies suicidal ideation, mood changes, confusion, nervousness, sleep disturbance and agitation    Physical Exam: Vitals:   04/29/19 0707 04/29/19 0816  BP: 140/80 120/80  Pulse: 62   Temp: 98.7 F (37.1 C)   TempSrc: Oral   SpO2: 94%   Weight: 243 lb 8 oz (110.5 kg)   Height: 6' 2.25" (1.886 m)     Body mass index is 31.05 kg/m.   Constitutional: NAD, calm, comfortable Eyes: PERRL, lids and conjunctivae normal ENMT: Mucous membranes are moist. Neck: normal, supple, no masses, no thyromegaly Respiratory: clear to auscultation bilaterally, no wheezing, no crackles. Normal respiratory effort. No accessory muscle use.  Cardiovascular: Regular rate and rhythm, no murmurs / rubs / gallops. No extremity edema. 2+ pedal pulses. No carotid bruits.  Abdomen: no tenderness, no  masses palpated. No hepatosplenomegaly. Bowel sounds positive.  Musculoskeletal: no clubbing / cyanosis. No joint deformity upper and lower extremities. Good ROM, no contractures. Normal muscle tone.  Skin: no rashes, lesions, ulcers. No induration Neurologic: Grossly intact and nonfocal  psychiatric: Normal judgment and insight. Alert and oriented x 3. Normal mood.    Impression and Plan:  Chest pain, unspecified type/upper back pain -While it is entirely likely that this is a muscle spasm related to the exertion of power washing his house, I believe we would be remiss if we did not rule out a thoracic or aortic arch dissection given his presentation, escalating symptoms and blood pressure differential of his upper extremities.  In office blood pressure is 120/80 of the left arm and 140/80 of the right arm. -Will check basic metabolic profile  for renal function as I do not have a recent one on file to make sure he can accept the contrast load that he will need for a CT angiogram of his chest that I will schedule as a stat to be performed today. -In office EKG interpreted by myself shows normal sinus rhythm with some nonspecific T wave abnormalities. -Further plan to follow results of CTA.   Patient Instructions  -Nice to see you today!!  -We will be sending you for a CT of the chest today. Will notify you as soon as results are available to me.  -Lab work today.     Lelon Frohlich, MD [facility]

## 2019-04-29 NOTE — Patient Instructions (Signed)
-  Nice to see you today!!  -We will be sending you for a CT of the chest today. Will notify you as soon as results are available to me.  -Lab work today.

## 2019-04-30 ENCOUNTER — Encounter: Payer: Self-pay | Admitting: Internal Medicine

## 2019-05-03 ENCOUNTER — Encounter: Payer: Self-pay | Admitting: Internal Medicine

## 2019-05-03 ENCOUNTER — Inpatient Hospital Stay (HOSPITAL_COMMUNITY)
Admission: EM | Admit: 2019-05-03 | Discharge: 2019-05-05 | DRG: 247 | Disposition: A | Discharge status: Home / Self Care | Payer: Medicare Other | Attending: Cardiovascular Disease | Admitting: Cardiovascular Disease

## 2019-05-03 ENCOUNTER — Ambulatory Visit (INDEPENDENT_AMBULATORY_CARE_PROVIDER_SITE_OTHER): Payer: Medicare Other | Admitting: *Deleted

## 2019-05-03 ENCOUNTER — Emergency Department (HOSPITAL_COMMUNITY): Payer: Medicare Other

## 2019-05-03 ENCOUNTER — Encounter (HOSPITAL_COMMUNITY): Payer: Self-pay

## 2019-05-03 ENCOUNTER — Other Ambulatory Visit: Payer: Self-pay

## 2019-05-03 DIAGNOSIS — Z20828 Contact with and (suspected) exposure to other viral communicable diseases: Secondary | ICD-10-CM | POA: Diagnosis present

## 2019-05-03 DIAGNOSIS — I10 Essential (primary) hypertension: Secondary | ICD-10-CM | POA: Diagnosis present

## 2019-05-03 DIAGNOSIS — I214 Non-ST elevation (NSTEMI) myocardial infarction: Secondary | ICD-10-CM | POA: Diagnosis present

## 2019-05-03 DIAGNOSIS — E7800 Pure hypercholesterolemia, unspecified: Secondary | ICD-10-CM | POA: Diagnosis present

## 2019-05-03 DIAGNOSIS — Z96653 Presence of artificial knee joint, bilateral: Secondary | ICD-10-CM | POA: Diagnosis present

## 2019-05-03 DIAGNOSIS — E669 Obesity, unspecified: Secondary | ICD-10-CM | POA: Diagnosis present

## 2019-05-03 DIAGNOSIS — Z7982 Long term (current) use of aspirin: Secondary | ICD-10-CM

## 2019-05-03 DIAGNOSIS — K219 Gastro-esophageal reflux disease without esophagitis: Secondary | ICD-10-CM | POA: Diagnosis present

## 2019-05-03 DIAGNOSIS — E785 Hyperlipidemia, unspecified: Secondary | ICD-10-CM | POA: Diagnosis present

## 2019-05-03 DIAGNOSIS — R079 Chest pain, unspecified: Secondary | ICD-10-CM | POA: Diagnosis not present

## 2019-05-03 DIAGNOSIS — Z87891 Personal history of nicotine dependence: Secondary | ICD-10-CM | POA: Diagnosis not present

## 2019-05-03 DIAGNOSIS — Z6830 Body mass index (BMI) 30.0-30.9, adult: Secondary | ICD-10-CM

## 2019-05-03 DIAGNOSIS — Z23 Encounter for immunization: Secondary | ICD-10-CM | POA: Diagnosis not present

## 2019-05-03 DIAGNOSIS — J449 Chronic obstructive pulmonary disease, unspecified: Secondary | ICD-10-CM | POA: Diagnosis present

## 2019-05-03 DIAGNOSIS — R0602 Shortness of breath: Secondary | ICD-10-CM | POA: Diagnosis not present

## 2019-05-03 DIAGNOSIS — Z8673 Personal history of transient ischemic attack (TIA), and cerebral infarction without residual deficits: Secondary | ICD-10-CM

## 2019-05-03 DIAGNOSIS — I2510 Atherosclerotic heart disease of native coronary artery without angina pectoris: Secondary | ICD-10-CM | POA: Diagnosis present

## 2019-05-03 DIAGNOSIS — Z7951 Long term (current) use of inhaled steroids: Secondary | ICD-10-CM | POA: Diagnosis not present

## 2019-05-03 DIAGNOSIS — I639 Cerebral infarction, unspecified: Secondary | ICD-10-CM

## 2019-05-03 DIAGNOSIS — Z955 Presence of coronary angioplasty implant and graft: Secondary | ICD-10-CM

## 2019-05-03 DIAGNOSIS — Z79899 Other long term (current) drug therapy: Secondary | ICD-10-CM | POA: Diagnosis not present

## 2019-05-03 LAB — SARS CORONAVIRUS 2 BY RT PCR (HOSPITAL ORDER, PERFORMED IN ~~LOC~~ HOSPITAL LAB): SARS Coronavirus 2: NEGATIVE

## 2019-05-03 LAB — CUP PACEART REMOTE DEVICE CHECK
Date Time Interrogation Session: 20200726204134
Implantable Pulse Generator Implant Date: 20190424

## 2019-05-03 LAB — BASIC METABOLIC PANEL
Anion gap: 11 (ref 5–15)
BUN: 12 mg/dL (ref 8–23)
CO2: 23 mmol/L (ref 22–32)
Calcium: 9.3 mg/dL (ref 8.9–10.3)
Chloride: 102 mmol/L (ref 98–111)
Creatinine, Ser: 0.86 mg/dL (ref 0.61–1.24)
GFR calc Af Amer: >60 mL/min (ref >60)
GFR calc non Af Amer: >60 mL/min (ref >60)
Glucose, Bld: 97 mg/dL (ref 70–99)
Potassium: 4.2 mmol/L (ref 3.5–5.1)
Sodium: 136 mmol/L (ref 135–145)

## 2019-05-03 LAB — TROPONIN I (HIGH SENSITIVITY)
Troponin I (High Sensitivity): 1900 ng/L (ref <18)
Troponin I (High Sensitivity): 8583 ng/L (ref <18)
Troponin I (High Sensitivity): 13984 ng/L (ref <18)

## 2019-05-03 LAB — CBC
HCT: 44.7 % (ref 39.0–52.0)
Hemoglobin: 15.1 g/dL (ref 13.0–17.0)
MCH: 28.8 pg (ref 26.0–34.0)
MCHC: 33.8 g/dL (ref 30.0–36.0)
MCV: 85.3 fL (ref 80.0–100.0)
Platelets: 259 10*3/uL (ref 150–400)
RBC: 5.24 MIL/uL (ref 4.22–5.81)
RDW: 13.2 % (ref 11.5–15.5)
WBC: 8.9 10*3/uL (ref 4.0–10.5)
nRBC: 0 % (ref 0.0–0.2)

## 2019-05-03 LAB — HEPARIN LEVEL (UNFRACTIONATED): Heparin Unfractionated: 0.29 IU/mL — ABNORMAL LOW (ref 0.30–0.70)

## 2019-05-03 LAB — MRSA PCR SCREENING: MRSA by PCR: NEGATIVE

## 2019-05-03 MED ORDER — NITROGLYCERIN 0.4 MG SL SUBL: 0.4000 mg | SUBLINGUAL_TABLET | SUBLINGUAL | Status: DC | PRN

## 2019-05-03 MED ORDER — SODIUM CHLORIDE 0.9% FLUSH
3.0000 mL | Freq: Once | INTRAVENOUS | Status: AC
  Administered 2019-05-03: 14:00:00 3 mL via INTRAVENOUS

## 2019-05-03 MED ORDER — ACETAMINOPHEN 325 MG PO TABS: 650.0000 mg | ORAL_TABLET | ORAL | Status: DC | PRN

## 2019-05-03 MED ORDER — ONDANSETRON HCL 4 MG/2ML IJ SOLN: 4.0000 mg | Freq: Four times a day (QID) | INTRAMUSCULAR | Status: DC | PRN

## 2019-05-03 MED ORDER — SODIUM CHLORIDE 0.9% FLUSH: 3.0000 mL | INTRAVENOUS | Status: DC | PRN

## 2019-05-03 MED ORDER — FLUTICASONE-SALMETEROL 232-14 MCG/ACT IN AEPB: 1.0000 | INHALATION_SPRAY | RESPIRATORY_TRACT | Status: DC | PRN

## 2019-05-03 MED ORDER — PNEUMOCOCCAL VAC POLYVALENT 25 MCG/0.5ML IJ INJ: 0.5000 mL | INJECTION | INTRAMUSCULAR | Status: DC | PRN

## 2019-05-03 MED ORDER — FLUTICASONE FUROATE-VILANTEROL 100-25 MCG/INH IN AEPB: 2.0000 | INHALATION_SPRAY | Freq: Every day | RESPIRATORY_TRACT | Status: DC | PRN

## 2019-05-03 MED ORDER — SODIUM CHLORIDE 0.9 % WEIGHT BASED INFUSION
3.0000 mL/kg/h | INTRAVENOUS | Status: DC
  Administered 2019-05-04: 04:00:00 3 mL/kg/h via INTRAVENOUS

## 2019-05-03 MED ORDER — PANTOPRAZOLE SODIUM 40 MG PO TBEC
40.0000 mg | DELAYED_RELEASE_TABLET | Freq: Every day | ORAL | Status: DC
  Administered 2019-05-04 – 2019-05-05 (×2): 40 mg via ORAL
  Filled 2019-05-03 (×2): qty 1

## 2019-05-03 MED ORDER — SODIUM CHLORIDE 0.9 % WEIGHT BASED INFUSION
1.0000 mL/kg/h | INTRAVENOUS | Status: DC
  Administered 2019-05-04: 05:00:00 1 mL/kg/h via INTRAVENOUS

## 2019-05-03 MED ORDER — ASPIRIN EC 81 MG PO TBEC
81.0000 mg | DELAYED_RELEASE_TABLET | Freq: Every day | ORAL | Status: DC
  Administered 2019-05-04 – 2019-05-05 (×2): 81 mg via ORAL
  Filled 2019-05-03 (×3): qty 1

## 2019-05-03 MED ORDER — AMLODIPINE BESYLATE 10 MG PO TABS
10.0000 mg | ORAL_TABLET | Freq: Every day | ORAL | Status: DC
  Administered 2019-05-04 – 2019-05-05 (×2): 10 mg via ORAL
  Filled 2019-05-03 (×2): qty 1

## 2019-05-03 MED ORDER — CARVEDILOL 3.125 MG PO TABS
3.1250 mg | ORAL_TABLET | Freq: Two times a day (BID) | ORAL | Status: DC
  Administered 2019-05-04 (×2): 3.125 mg via ORAL
  Filled 2019-05-03 (×3): qty 1

## 2019-05-03 MED ORDER — ASPIRIN 81 MG PO CHEW
324.0000 mg | CHEWABLE_TABLET | Freq: Once | ORAL | Status: AC
  Administered 2019-05-03: 14:00:00 324 mg via ORAL
  Filled 2019-05-03: qty 4

## 2019-05-03 MED ORDER — SODIUM CHLORIDE 0.9% FLUSH
3.0000 mL | Freq: Two times a day (BID) | INTRAVENOUS | Status: DC
  Administered 2019-05-03 – 2019-05-04 (×3): 3 mL via INTRAVENOUS

## 2019-05-03 MED ORDER — HEPARIN (PORCINE) 25000 UT/250ML-% IV SOLN
1500.0000 [IU]/h | INTRAVENOUS | Status: DC
  Administered 2019-05-03: 17:00:00 1400 [IU]/h via INTRAVENOUS
  Filled 2019-05-03: qty 250

## 2019-05-03 MED ORDER — NITROGLYCERIN 2 % TD OINT
1.0000 [in_us] | TOPICAL_OINTMENT | Freq: Four times a day (QID) | TRANSDERMAL | Status: DC
  Administered 2019-05-03: 15:00:00 1 [in_us] via TOPICAL
  Filled 2019-05-03: qty 1

## 2019-05-03 MED ORDER — SODIUM CHLORIDE 0.9 % IV SOLN: 250.0000 mL | INTRAVENOUS | Status: DC | PRN

## 2019-05-03 MED ORDER — HEPARIN BOLUS VIA INFUSION
4000.0000 [IU] | Freq: Once | INTRAVENOUS | Status: AC
  Administered 2019-05-03: 17:00:00 4000 [IU] via INTRAVENOUS
  Filled 2019-05-03: qty 4000

## 2019-05-03 MED ORDER — ATORVASTATIN CALCIUM 80 MG PO TABS
80.0000 mg | ORAL_TABLET | Freq: Every day | ORAL | Status: DC
  Administered 2019-05-04: 18:00:00 80 mg via ORAL
  Filled 2019-05-03: qty 1

## 2019-05-03 NOTE — ED Triage Notes (Addendum)
Pt reports intermittent CP radiating to back between shoulders x 2 weeks. Pt reports pain occurs on exertion mostly while doing yard work. Pt reports Pt denies SHOB, nausea, dizziness, or lightheadedness. He reports the pain resolves with 30-45 minutes of rest.

## 2019-05-03 NOTE — H&P (Addendum)
Cardiology Admission History and Physical:   Patient ID: Charles Mccoy MRN: [id number]; DOB: 01/19/1951   Admission date: 05/03/2019  Primary Care Provider: [name], [name], MD Primary Cardiologist: Cristopher Peru   Chief Complaint:  Chest pain  Patient Profile:   Charles Mccoy is a 68 y.o. male with a PMH of CVA s/p ILR, HTN, HLD, and former smoker (quit ~59yr ago), who presented with chest pain.  History of Present Illness:   Charles Mccoy was in his usual state of health until today when he began experiencing chest heaviness/pressure/tightness with associated diaphoresis while mowing his lawn. He was seen by his PCP last week with similar complaints of chest pain radiating from back to front while power washing and was referred for a CTA chest which did not reveal dissection/aneurysm of his aorta, but did reveal moderate mixed calcific atherosclerosis and 3-vessel coronary artery calcifications. Given recurrent pain and plans to travel to [place] tomorrow, he decided to present to the ED for evaluation.   He has a ILR placed by Dr. Lovena Le after his stroke in 2019. Last device check 03/2019 with 2 pause events (5 seconds), and no arrhythmias. His last echocardiogram 01/2018 showed EF 60-65%, normal LV diastolic fucntion, mild AI, and increased atrial septum thickness c/w lipomatous hypertrophy.   At the time of this evaluation he is chest pain free. He describes waxing and waning exertional symptoms over the past 2 weeks. Pain typically starts in his back and radiates to his chest with associated SOB and diaphoresis. He reports blood pressures are typically 130s-150s/70s-80s at home. No complaints of orthopnea, PND, LE edema, palpitations, dizziness, lightheadedness, or syncope. No significant family history of CAD. He has never had an ischemic evaluation in the past.   ED course: hypertensive, otherwise VSS. Labs notable for electrolytes wnl, Cr 0.86, CBC wnl, Trop 1900. CXR without acute  findings. EKG with NSR, rate 97 bpm, possible anteroseptal infarct (age indetermined), no STE/D, isolated TWI in aVL, QTc 429. He was given ASA 324mg  and started on nitro paste. Cardiology asked to evaluate.   Heart Pathway Score:  HEAR Score: 6  Past Medical History:  Diagnosis Date  . ALLERGIC RHINITIS 06/19/2007  . ASTHMA 08/17/2008  . Back pain 05/2016  . COPD (chronic obstructive pulmonary disease) ([place])   . GERD (gastroesophageal reflux disease)   . HYPERTENSION 06/19/2007  . Hypertension    meds controlling well  . OSTEOARTHRITIS 10/09/2009   wrists, knee.  . Pneumonia    hx of   . SINUSITIS 09/15/2007   recent issuses 2 weeks ago-clearing.  . Stroke [facility])     Past Surgical History:  Procedure Laterality Date  . cyst removed from neck      age 28   . KNEE ARTHROSCOPY     2 on left knee , 1 on right  . LOOP RECORDER INSERTION N/A 01/28/2018   Procedure: LOOP RECORDER INSERTION;  Surgeon: Evans Lance, MD;  Location: [facility];  Service: Cardiovascular;  Laterality: N/A;  . TEE WITHOUT CARDIOVERSION N/A 01/28/2018   Procedure: TRANSESOPHAGEAL ECHOCARDIOGRAM (TEE);  Surgeon: Acie Fredrickson Wonda Cheng, MD;  Location: [facility] ENDOSCOPY;  Service: Cardiovascular;  Laterality: N/A;  . TOTAL KNEE ARTHROPLASTY Left 07/21/2014   Procedure: LEFT TOTAL KNEE ARTHROPLASTY;  Surgeon: Johnn Hai, MD;  Location: [facility];  Service: Orthopedics;  Laterality: Left;  . TOTAL KNEE ARTHROPLASTY Right 05/30/2016   Procedure: RIGHT TOTAL KNEE ARTHROPLASTY REPLACEMENT;  Surgeon: Susa Day, MD;  Location: [facility];  Service: Orthopedics;  Laterality: Right;     Medications Prior to Admission: Prior to Admission medications   Medication Sig Start Date End Date Taking? Authorizing Provider  amLODipine (NORVASC) 10 MG tablet TAKE 1 TABLET BY MOUTH EVERY DAY IN THE MORNING Patient taking differently: Take 10 mg by mouth daily. TAKE 1 TABLET BY MOUTH EVERY DAY IN THE MORNING 04/29/19  Yes [name], [name], MD  aspirin 81 MG EC tablet Take 1 tablet (81 mg total) by mouth daily. 02/23/18  Yes Venancio Poisson, NP  azelastine (ASTELIN) 0.1 % nasal spray Place 1 spray into both nostrils as needed for allergies.  01/13/18  Yes [provider]  fexofenadine (ALLEGRA) 60 MG tablet Take 60 mg by mouth as needed for allergies.    Yes [provider]  Fluticasone-Salmeterol 232-14 MCG/ACT AEPB Inhale 1 puff into the lungs as needed (shortness of breath).  01/13/18  Yes [provider]  pantoprazole (PROTONIX) 40 MG tablet Take 1 tablet (40 mg total) by mouth daily. 04/29/19  Yes [name], [name], MD  spironolactone (ALDACTONE) 25 MG tablet Take 1 tablet (25 mg total) by mouth daily. 04/29/19  Yes [name], [name], MD  atorvastatin (LIPITOR) 40 MG tablet Take 1 tablet (40 mg total) by mouth daily at 6 PM. Patient not taking: Reported on 05/03/2019 02/23/18   Venancio Poisson, NP     Allergies:    Allergies  Allergen Reactions  . Erythromycin Base Other (See Comments)    REACTION: stomach irritation    Social History:   Social History   Socioeconomic History  . Marital status: Married    Spouse name: Not on file  . Number of children: Not on file  . Years of education: Not on file  . Highest education level: Not on file  Occupational History  . Not on file  Social Needs  . Financial resource strain: Not on file  . Food insecurity    Worry: Not on file    Inability: Not on file  . Transportation needs    Medical: Not on file    Non-medical: Not on file  Tobacco Use  . Smoking status: Former Smoker    Types: Cigarettes    Quit date: 10/07/2001    Years since quitting: 17.5  . Smokeless tobacco: Never Used  Substance and Sexual Activity  . Alcohol use: Yes    Alcohol/week: 6.0 standard drinks    Types: 6 Cans of beer per week    Comment: beer daily  . Drug use: No  . Sexual activity: Yes  Lifestyle  . Physical activity     Days per week: Not on file    Minutes per session: Not on file  . Stress: Not on file  Relationships  . Social Herbalist on phone: Not on file    Gets together: Not on file    Attends religious service: Not on file    Active member of club or organization: Not on file    Attends meetings of clubs or organizations: Not on file    Relationship status: Not on file  . Intimate partner violence    Fear of current or ex partner: Not on file    Emotionally abused: Not on file    Physically abused: Not on file    Forced sexual activity: Not on file  Other Topics Concern  . Not on file  Social History Narrative  . Not on file  Family History:   The patient's family history is negative for Colon cancer, Esophageal cancer, Rectal cancer, and Stomach cancer.    ROS:  Please see the history of present illness.  All other ROS reviewed and negative.     Physical Exam/Data:   Vitals:   05/03/19 1311 05/03/19 1342 05/03/19 1347 05/03/19 1400  BP:  (!) 142/90  132/90  Pulse: 78 69  69  Resp: 20 16  16   Temp:      SpO2: 100% 95%  94%  Weight:   111.1 kg   Height:   6\' 3"  (1.905 m)     Intake/Output Summary (Last 24 hours) at 05/03/2019 1437 Last data filed at 05/03/2019 1349 Gross per 24 hour  Intake 3 ml  Output -  Net 3 ml   Last 3 Weights 05/03/2019 04/29/2019 02/09/2019  Weight (lbs) 245 lb 243 lb 8 oz 246 lb  Weight (kg) 111.131 kg 110.451 kg 111.585 kg     Body mass index is 30.62 kg/m.  General:  Well nourished, well developed, in no acute distress HEENT: sclera anicteric Neck: no JVD Endocrine:  No thryomegaly Vascular: No carotid bruits; distal pulses 2+ bilaterally  Cardiac:  normal S1, S2; RRR; no murmurs, rubs, or gallops Lungs:  clear to auscultation bilaterally, no wheezing, rhonchi or rales  Abd: soft, nontender, no hepatomegaly  Ext: trace LE edema Musculoskeletal:  No deformities, BUE and BLE strength normal and equal Skin: warm and dry  Neuro:   CNs 2-12 intact, no focal abnormalities noted Psych:  Normal affect    EKG:  The ECG that was done 05/03/2019 was personally reviewed and demonstrates NSR, rate 97 bpm, possible anteroseptal infarct (age indetermined), no STE/D, isolated TWI in aVL, QTc 429  Relevant CV Studies: Echocardiogram 01/2018: Study Conclusions  - Left ventricle: The cavity size was moderately dilated. Systolic function was normal. The estimated ejection fraction was in the range of 60% to 65%. Wall motion was normal; there were no regional wall motion abnormalities. Left ventricular diastolic function parameters were normal. - Aortic valve: There was mild regurgitation. - Mitral valve: Calcified annulus. - Left atrium: The atrium was mildly dilated. - Right ventricle: The cavity size was moderately dilated. Wall thickness was normal. - Atrial septum: There was increased thickness of the septum,   consistent with lipomatous hypertrophy.  Laboratory Data:  High Sensitivity Troponin:   Recent Labs  Lab 05/03/19 1245  TROPONINIHS 1,900*      Cardiac EnzymesNo results for input(s): TROPONINI in the last 168 hours. No results for input(s): TROPIPOC in the last 168 hours.  Chemistry Recent Labs  Lab 04/29/19 0754 05/03/19 1245  NA 138 136  K 4.1 4.2  CL 102 102  CO2 27 23  GLUCOSE 106* 97  BUN 14 12  CREATININE 0.95 0.86  CALCIUM 9.2 9.3  GFRNONAA  --  >60  GFRAA  --  >60  ANIONGAP  --  11    No results for input(s): PROT, ALBUMIN, AST, ALT, ALKPHOS, BILITOT in the last 168 hours. Hematology Recent Labs  Lab 05/03/19 1245  WBC 8.9  RBC 5.24  HGB 15.1  HCT 44.7  MCV 85.3  MCH 28.8  MCHC 33.8  RDW 13.2  PLT 259   BNPNo results for input(s): BNP, PROBNP in the last 168 hours.  DDimer No results for input(s): DDIMER in the last 168 hours.   Radiology/Studies:  Dg Chest 2 View  Result Date: 05/03/2019 CLINICAL DATA:  Chest pain,  hypertension EXAM: CHEST - 2 VIEW COMPARISON:  07/12/2014  FINDINGS: Loop recorder device is present. The heart size and mediastinal contours are within normal limits. Calcific aortic knob. Both lungs are clear. Mild multilevel degenerative changes of the thoracic spine. IMPRESSION: No active cardiopulmonary disease. Electronically Signed   By: Davina Poke M.D.   On: 05/03/2019 13:36    Assessment and Plan:   1. NSTEMI: patient presented with exertional chest pain. Saw PCP with similar complaints last week and sent for CTA Chest to r/o dissection - no dissection/aneurysm but did show multivessel coronary artery calcifications. EKG non-ischemic, thought trop elevated to 4818>5631. Risk factors for ACS include HTN and HLD.  - Plan for LHC in AM - Check Lipid panel and HgbA1C for risk stratification - Will start heparin gtt  - Continue nitro paste - Continue aspirin and statin - Will start low dose carvedilol   2. HTN: BP elevated - Continue amlodipine  - Will hold spironolactone until after catheterization - Will start low dose carvedilol - has been noted to have nocturnal bradycardia on loop recorder. Will monitor response on telemetry  3. HLD: LDL 63 06/2018 - Will check FLP in AM - Increase atorvastatin to 80mg  daily  4. CVA: embolic CVA with unclear etiology. ILR implanted without evidence of arrhythmias. No neurologic complaints at this time - Continue aspirin and statin  5. Obesity: BMI 30 - Continue to encourage dietary and lifestyle modifications to promote weight loss.   Severity of Illness: The appropriate patient status for this patient is INPATIENT. Inpatient status is judged to be reasonable and necessary in order to provide the required intensity of service to ensure the patient's safety. The patient's presenting symptoms, physical exam findings, and initial radiographic and laboratory data in the context of their chronic comorbidities is felt to place them at high risk for further clinical deterioration. Furthermore, it is not  anticipated that the patient will be medically stable for discharge from the hospital within 2 midnights of admission. The following factors support the patient status of inpatient.   " The patient's presenting symptoms include chest pain. " The worrisome physical exam findings include benign cardiopulmonary exam. " The initial radiographic and laboratory data are worrisome because of elevated and uptrending troponin. " The chronic co-morbidities include HTN and HLD.   * I certify that at the point of admission it is my clinical judgment that the patient will require inpatient hospital care spanning beyond 2 midnights from the point of admission due to high intensity of service, high risk for further deterioration and high frequency of surveillance required.*    For questions or updates, please contact [place] Please consult [email or web address] for contact info under    Signed, Abigail Butts, PA-C  05/03/2019 2:37 PM   I have personally seen and examined this patient with Roby Lofts, PA-C. I agree with the assessment and plan as outlined above.  Charles Mccoy has a history of remote prior CVA, tobacco abuse, HTN and HLD and has had recent chest pain with exercise. Troponin is elevated. He has no chest pain in the ED today. EKG is reviewed by me. Sinus with no ischemic changes. No prior cardiac disease. Coronary calcification noted on chest CT.  My exam:   General: Well developed, well nourished, NAD  HEENT: OP clear, mucus membranes moist  SKIN: warm, dry. No rashes.  Neuro: No focal deficits  Musculoskeletal: Muscle strength 5/5 all ext  Psychiatric: Mood and affect normal  Neck: No  JVD, no carotid bruits, no thyromegaly, no lymphadenopathy.  Lungs:Clear bilaterally, no wheezes, rhonci, crackles  Cardiovascular: Regular rate and rhythm. No murmurs, gallops or rubs.  Abdomen:Soft. Bowel sounds present. Non-tender.  Extremities: No lower extremity edema. Pulses are 2 + in the  bilateral DP/PT.   Plan: Unstable angina: Will plan to admit to tele. Will start IV heparin. Continue NTG paste. Start high intensity statin. Continue ASA. Plans for cardiac cath with probable PCI tomorrow. I have reviewed the risks, indications, and alternatives to cardiac catheterization, possible angioplasty, and stenting with the patient. Risks include but are not limited to bleeding, infection, vascular injury, stroke, myocardial infection, arrhythmia, kidney injury, radiation-related injury in the case of prolonged fluoroscopy use, emergency cardiac surgery, and death. The patient understands the risks of serious complication is 1-2 in 8343 with diagnostic cardiac cath and 1-2% or less with angioplasty/stenting.   Lauree Chandler, MD 05/03/2019 4:30 PM

## 2019-05-03 NOTE — ED Provider Notes (Signed)
[facility] EMERGENCY DEPARTMENT Provider Note   CSN: [id number] Arrival date & time: 05/03/19  1228    History   Chief Complaint Chief Complaint  Patient presents with  . Chest Pain    HPI Charles Mccoy is a 68 y.o. male.  HPI: A 68 year old patient with a history of CVA, hypertension and hypercholesterolemia presents for evaluation of chest pain. Initial onset of pain was approximately 3-6 hours ago. The patient's chest pain is described as heaviness/pressure/tightness and is not worse with exertion. The patient reports some diaphoresis. The patient's chest pain is not middle- or left-sided, is not well-localized, is not sharp and does radiate to the arms/jaw/neck. The patient does not complain of nausea. The patient has no history of peripheral artery disease, has not smoked in the past 90 days, denies any history of treated diabetes, has no relevant family history of coronary artery disease (first degree relative at less than age 58) and does not have an elevated BMI (>=30).   Patient was mowing his lawn when he started having chest pain.  He build his way and completed cutting the grass.  Around noon the pain had gotten better but not resolved and patient was supposed to travel to [place] for work tomorrow and wanted to make sure was safe for him to go.  At the moment he continues to have some discomfort between his shoulder blades but the pain is significantly better.  He had similar discomfort when he was power washing.  The pain starts in the back and radiates in the front, patient saw his PCP and had a CT dissection study which was negative.  HPI  Past Medical History:  Diagnosis Date  . ALLERGIC RHINITIS 06/19/2007  . ASTHMA 08/17/2008  . Back pain 05/2016  . COPD (chronic obstructive pulmonary disease) ([place])   . GERD (gastroesophageal reflux disease)   . HYPERTENSION 06/19/2007  . Hypertension    meds controlling well  . OSTEOARTHRITIS 10/09/2009   wrists,  knee.  . Pneumonia    hx of   . SINUSITIS 09/15/2007   recent issuses 2 weeks ago-clearing.  . Stroke [facility])     Patient Active Problem List   Diagnosis Date Noted  . Pulmonary nodules/lesions, multiple 04/29/2019  . Right carpal tunnel syndrome 10/15/2018  . Left inguinal hernia 10/15/2018  . Cerebrovascular accident (CVA) ([place])   . Hyperlipidemia 01/20/2018  . BPH (benign prostatic hyperplasia) 01/20/2018  . COPD (chronic obstructive pulmonary disease) ([place]) 01/20/2018  . Cerebral infarction due to embolism of left middle cerebral artery ([place]) s/p tPA 01/19/2018  . Suspected stroke patient last known to be well 2 to 3 hours ago 01/18/2018  . Primary osteoarthritis of right knee 05/30/2016  . Right knee DJD 05/30/2016  . GERD (gastroesophageal reflux disease) 07/28/2014  . Left knee DJD 07/21/2014  . Osteoarthritis 10/09/2009  . Asthma 08/17/2008  . SINUSITIS 09/15/2007  . Essential hypertension 06/19/2007  . Allergic rhinitis 06/19/2007    Past Surgical History:  Procedure Laterality Date  . cyst removed from neck      age 15   . KNEE ARTHROSCOPY     2 on left knee , 1 on right  . LOOP RECORDER INSERTION N/A 01/28/2018   Procedure: LOOP RECORDER INSERTION;  Surgeon: Evans Lance, MD;  Location: [facility];  Service: Cardiovascular;  Laterality: N/A;  . TEE WITHOUT CARDIOVERSION N/A 01/28/2018   Procedure: TRANSESOPHAGEAL ECHOCARDIOGRAM (TEE);  Surgeon: Thayer Headings, MD;  Location: [facility]  ENDOSCOPY;  Service: Cardiovascular;  Laterality: N/A;  . TOTAL KNEE ARTHROPLASTY Left 07/21/2014   Procedure: LEFT TOTAL KNEE ARTHROPLASTY;  Surgeon: Johnn Hai, MD;  Location: [facility] ORS;  Service: Orthopedics;  Laterality: Left;  . TOTAL KNEE ARTHROPLASTY Right 05/30/2016   Procedure: RIGHT TOTAL KNEE ARTHROPLASTY REPLACEMENT;  Surgeon: Susa Day, MD;  Location: [facility] ORS;  Service: Orthopedics;  Laterality: Right;        Home Medications    Prior to Admission  medications   Medication Sig Start Date End Date Taking? Authorizing Provider  amLODipine (NORVASC) 10 MG tablet TAKE 1 TABLET BY MOUTH EVERY DAY IN THE MORNING 04/29/19   [name], [name], MD  aspirin 81 MG EC tablet Take 1 tablet (81 mg total) by mouth daily. 02/23/18   Venancio Poisson, NP  atorvastatin (LIPITOR) 40 MG tablet Take 1 tablet (40 mg total) by mouth daily at 6 PM. 02/23/18   Venancio Poisson, NP  azelastine (ASTELIN) 0.1 % nasal spray Place 1 spray into both nostrils at bedtime. 01/13/18   [provider]  fexofenadine (ALLEGRA) 60 MG tablet Take 60 mg by mouth daily.    [provider]  Fluticasone-Salmeterol 232-14 MCG/ACT AEPB Inhale 1 puff into the lungs 2 (two) times daily. 01/13/18   [provider]  pantoprazole (PROTONIX) 40 MG tablet Take 1 tablet (40 mg total) by mouth daily. 04/29/19   [name], [name], MD  spironolactone (ALDACTONE) 25 MG tablet Take 1 tablet (25 mg total) by mouth daily. 04/29/19   [name], [name], MD    Family History Family History  Problem Relation Age of Onset  . Colon cancer Neg Hx   . Esophageal cancer Neg Hx   . Rectal cancer Neg Hx   . Stomach cancer Neg Hx     Social History Social History   Tobacco Use  . Smoking status: Former Smoker    Types: Cigarettes    Quit date: 10/07/2001    Years since quitting: 17.5  . Smokeless tobacco: Never Used  Substance Use Topics  . Alcohol use: Yes    Alcohol/week: 6.0 standard drinks    Types: 6 Cans of beer per week    Comment: beer daily  . Drug use: No     Allergies   Erythromycin base   Review of Systems Review of Systems  Constitutional: Positive for activity change and diaphoresis.  Respiratory: Positive for shortness of breath.   Cardiovascular: Positive for chest pain.  Gastrointestinal: Negative for nausea.  All other systems reviewed and are negative.    Physical Exam Updated Vital Signs BP 132/90   Pulse 69    Temp 98.4 F (36.9 C)   Resp 16   Ht 6\' 3"  (1.905 m)   Wt 111.1 kg   SpO2 94%   BMI 30.62 kg/m   Physical Exam Vitals signs and nursing note reviewed.  Constitutional:      Appearance: He is well-developed.  HENT:     Head: Normocephalic and atraumatic.  Eyes:     Conjunctiva/sclera: Conjunctivae normal.     Pupils: Pupils are equal, round, and reactive to light.  Neck:     Musculoskeletal: Normal range of motion and neck supple.  Cardiovascular:     Rate and Rhythm: Normal rate and regular rhythm.     Pulses:          Radial pulses are 2+ on the right side and 2+ on the left side.  Heart sounds: Normal heart sounds.  Pulmonary:     Effort: Pulmonary effort is normal. No respiratory distress.     Breath sounds: Normal breath sounds. No wheezing.  Abdominal:     General: Bowel sounds are normal. There is no distension.     Palpations: Abdomen is soft.     Tenderness: There is no abdominal tenderness. There is no guarding or rebound.  Skin:    General: Skin is warm.  Neurological:     Mental Status: He is alert and oriented to person, place, and time.      ED Treatments / Results  Labs (all labs ordered are listed, but only abnormal results are displayed) Labs Reviewed  TROPONIN I (HIGH SENSITIVITY) - Abnormal; Notable for the following components:      Result Value   Troponin I (High Sensitivity) 1,900 (*)    All other components within normal limits  SARS CORONAVIRUS 2 (HOSPITAL ORDER, [facility])  BASIC METABOLIC PANEL  CBC  TROPONIN I (HIGH SENSITIVITY)    EKG EKG Interpretation  Date/Time:  Monday May 03 2019 12:38:55 EDT Ventricular Rate:  95 PR Interval:  174 QRS Duration: 96 QT Interval:  342 QTC Calculation: 429 R Axis:   8 Text Interpretation:  Normal sinus rhythm Cannot rule out Anteroseptal infarct , age undetermined Abnormal ECG No acute changes No significant change since last tracing Confirmed by Varney Biles [phone]) on 05/03/2019 1:21:27 PM   Radiology Dg Chest 2 View  Result Date: 05/03/2019 CLINICAL DATA:  Chest pain, hypertension EXAM: CHEST - 2 VIEW COMPARISON:  07/12/2014 FINDINGS: Loop recorder device is present. The heart size and mediastinal contours are within normal limits. Calcific aortic knob. Both lungs are clear. Mild multilevel degenerative changes of the thoracic spine. IMPRESSION: No active cardiopulmonary disease. Electronically Signed   By: Davina Poke M.D.   On: 05/03/2019 13:36    Procedures .Critical Care Performed by: Varney Biles, MD Authorized by: Varney Biles, MD   Critical care provider statement:    Critical care time (minutes):  32   Critical care was necessary to treat or prevent imminent or life-threatening deterioration of the following conditions:  Cardiac failure   Critical care was time spent personally by me on the following activities:  Discussions with consultants, evaluation of patient's response to treatment, examination of patient, ordering and performing treatments and interventions, ordering and review of laboratory studies, ordering and review of radiographic studies, pulse oximetry, re-evaluation of patient's condition, obtaining history from patient or surrogate and review of old charts   I assumed direction of critical care for this patient from another provider in my specialty: yes     (including critical care time)  Medications Ordered in ED Medications  nitroGLYCERIN (NITROGLYN) 2 % ointment 1 inch (has no administration in time range)  nitroGLYCERIN (NITROSTAT) SL tablet 0.4 mg (has no administration in time range)  sodium chloride flush (NS) 0.9 % injection 3 mL (3 mLs Intravenous Given 05/03/19 1349)  aspirin chewable tablet 324 mg (324 mg Oral Given 05/03/19 1429)     Initial Impression / Assessment and Plan / ED Course  I have reviewed the triage vital signs and the nursing notes.  Pertinent labs & imaging results that  were available during my care of the patient were reviewed by me and considered in my medical decision making (see chart for details).     HEAR Score: 6  Patient comes into the ER with chief complaint  of chest pain. His chest pain is nonspecific, starts in the back and radiates front.  He describes it as pressure and tightness type pain.  Pain started while he was mowing his lawn.  Initial high-sensitivity troponin is at 1900.  Some of it could be because of the rigorous work he did in the heat outside, but given the complaint of nonspecific back pain, chest pain that is exertional -there is a concern for ACS as well.  Patient is still having some discomfort.  We will get a repeat troponin in order cardiology consult. Patient will get full dose aspirin and nitroglycerin in the ER.  We might start him on heparin based on the repeat troponin.   Final Clinical Impressions(s) / ED Diagnoses   Final diagnoses:  NSTEMI (non-ST elevated myocardial infarction) [facility])    ED Discharge Orders    None       Varney Biles, MD 05/03/19 1430

## 2019-05-03 NOTE — Progress Notes (Signed)
ANTICOAGULATION CONSULT NOTE - Initial Consult  Pharmacy Consult for heparin Indication: chest pain/ACS  Allergies  Allergen Reactions  . Erythromycin Base Other (See Comments)    REACTION: stomach irritation    Patient Measurements: Height: 6\' 3"  (190.5 cm) Weight: 245 lb (111.1 kg) IBW/kg (Calculated) : 84.5 Heparin Dosing Weight: 107.3  Vital Signs: Temp: 98.4 F (36.9 C) (07/27 1243) BP: 134/96 (07/27 1445) Pulse Rate: 70 (07/27 1445)  Labs: Recent Labs    05/03/19 1245 05/03/19 1444  HGB 15.1  --   HCT 44.7  --   PLT 259  --   CREATININE 0.86  --   TROPONINIHS 1,900* 8,583*    Estimated Creatinine Clearance: 113.7 mL/min (by C-G formula based on SCr of 0.86 mg/dL).   Medical History: Past Medical History:  Diagnosis Date  . ALLERGIC RHINITIS 06/19/2007  . ASTHMA 08/17/2008  . Back pain 05/2016  . COPD (chronic obstructive pulmonary disease) ([place])   . GERD (gastroesophageal reflux disease)   . HYPERTENSION 06/19/2007  . Hypertension    meds controlling well  . OSTEOARTHRITIS 10/09/2009   wrists, knee.  . Pneumonia    hx of   . SINUSITIS 09/15/2007   recent issuses 2 weeks ago-clearing.  . Stroke ([place])     Medications:  Infusions:  . heparin      Assessment: Pt is a 68YOM w/ c/o intermittent CP that has radiated between his shoulders x 2 weeks. Pt has hx of HTN. Troponins significantly elevated. Pt found to have NSTEMI. No anticoagulation PTA, ASA PTA. No hx of bleed. Hgb 15.1 and Plts 259. Pharmacy to dose heparin.  Goal of Therapy:  Heparin level 0.3-0.7 units/ml Monitor platelets by anticoagulation protocol: Yes   Plan:  Heparin 4000 unit bolus IV x1 then heparin 1400 units/hr IV Check 6 hour HL Monitor daily HL and CBC, s/sx of bleed  Natale Lay 05/03/2019,4:17 PM

## 2019-05-03 NOTE — Plan of Care (Signed)

## 2019-05-03 NOTE — Progress Notes (Signed)
ANTICOAGULATION CONSULT NOTE - [place] for heparin Indication: chest pain/ACS  Allergies  Allergen Reactions  . Erythromycin Base Other (See Comments)    REACTION: stomach irritation    Patient Measurements: Height: 6\' 3"  (190.5 cm) Weight: 240 lb 8.4 oz (109.1 kg) IBW/kg (Calculated) : 84.5 Heparin Dosing Weight: 107.3  Vital Signs: Temp: 98.3 F (36.8 C) (07/27 2100) Temp Source: Oral (07/27 2100) BP: 119/70 (07/27 2100) Pulse Rate: 68 (07/27 2100)  Labs: Recent Labs    05/03/19 1245 05/03/19 1444 05/03/19 2229  HGB 15.1  --   --   HCT 44.7  --   --   PLT 259  --   --   HEPARINUNFRC  --   --  0.29*  CREATININE 0.86  --   --   TROPONINIHS 1,900* 8,583*  --     Estimated Creatinine Clearance: 112.7 mL/min (by C-G formula based on SCr of 0.86 mg/dL).   Medical History: Past Medical History:  Diagnosis Date  . ALLERGIC RHINITIS 06/19/2007  . ASTHMA 08/17/2008  . Back pain 05/2016  . COPD (chronic obstructive pulmonary disease) ([place])   . GERD (gastroesophageal reflux disease)   . HYPERTENSION 06/19/2007  . Hypertension    meds controlling well  . OSTEOARTHRITIS 10/09/2009   wrists, knee.  . Pneumonia    hx of   . SINUSITIS 09/15/2007   recent issuses 2 weeks ago-clearing.  . Stroke ([place])     Medications:  Infusions:  . sodium chloride    . [START ON 05/04/2019] sodium chloride     Followed by  . [START ON 05/04/2019] sodium chloride    . heparin 1,400 Units/hr (05/03/19 1723)    Assessment: Pt is a 68YOM w/ c/o intermittent CP that has radiated between his shoulders x 2 weeks. Pt has hx of HTN. Troponins significantly elevated. Pt found to have NSTEMI. No anticoagulation PTA, ASA PTA. No hx of bleed. Hgb 15.1 and Plts 259. Pharmacy to dose heparin.  Initial heparin level slightly subtherapeutic at 0.29, cath tomorrow.  Goal of Therapy:  Heparin level 0.3-0.7 units/ml Monitor platelets by anticoagulation protocol: Yes    Plan:  -Increase heparin to 1500 units/hr -Recheck heparin level in 6hr   Arrie Senate, PharmD, BCPS Clinical Pharmacist Please check AMION for all [place] numbers 05/03/2019

## 2019-05-03 NOTE — ED Notes (Signed)
Admittimg MD aware of increased troponin./ Info given face to face.

## 2019-05-04 ENCOUNTER — Encounter (HOSPITAL_COMMUNITY): Payer: Self-pay | Admitting: Cardiology

## 2019-05-04 ENCOUNTER — Inpatient Hospital Stay (HOSPITAL_COMMUNITY): Payer: Medicare Other

## 2019-05-04 ENCOUNTER — Other Ambulatory Visit: Payer: Self-pay

## 2019-05-04 ENCOUNTER — Inpatient Hospital Stay (HOSPITAL_COMMUNITY)
Admission: EM | Disposition: A | Discharge status: Home / Self Care | Payer: Self-pay | Source: Home / Self Care | Attending: Cardiovascular Disease

## 2019-05-04 DIAGNOSIS — I2510 Atherosclerotic heart disease of native coronary artery without angina pectoris: Secondary | ICD-10-CM

## 2019-05-04 DIAGNOSIS — R079 Chest pain, unspecified: Secondary | ICD-10-CM

## 2019-05-04 HISTORY — PX: LEFT HEART CATH AND CORONARY ANGIOGRAPHY: CATH118249

## 2019-05-04 HISTORY — PX: CORONARY STENT INTERVENTION: CATH118234

## 2019-05-04 LAB — BASIC METABOLIC PANEL
Anion gap: 10 (ref 5–15)
BUN: 13 mg/dL (ref 8–23)
CO2: 23 mmol/L (ref 22–32)
Calcium: 8.2 mg/dL — ABNORMAL LOW (ref 8.9–10.3)
Chloride: 100 mmol/L (ref 98–111)
Creatinine, Ser: 0.88 mg/dL (ref 0.61–1.24)
GFR calc Af Amer: >60 mL/min (ref >60)
GFR calc non Af Amer: >60 mL/min (ref >60)
Glucose, Bld: 138 mg/dL — ABNORMAL HIGH (ref 70–99)
Potassium: 3.7 mmol/L (ref 3.5–5.1)
Sodium: 133 mmol/L — ABNORMAL LOW (ref 135–145)

## 2019-05-04 LAB — POCT ACTIVATED CLOTTING TIME
Activated Clotting Time: 268 seconds
Activated Clotting Time: 296 seconds

## 2019-05-04 LAB — LIPID PANEL
Cholesterol: 120 mg/dL (ref 0–200)
HDL: 32 mg/dL — ABNORMAL LOW (ref >40)
LDL Cholesterol: 69 mg/dL (ref 0–99)
Total CHOL/HDL Ratio: 3.8 RATIO
Triglycerides: 94 mg/dL (ref <150)
VLDL: 19 mg/dL (ref 0–40)

## 2019-05-04 LAB — HEMOGLOBIN A1C
Hgb A1c MFr Bld: 5.5 % (ref 4.8–5.6)
Mean Plasma Glucose: 111.15 mg/dL

## 2019-05-04 LAB — CBC
HCT: 38.2 % — ABNORMAL LOW (ref 39.0–52.0)
Hemoglobin: 13.3 g/dL (ref 13.0–17.0)
MCH: 29 pg (ref 26.0–34.0)
MCHC: 34.8 g/dL (ref 30.0–36.0)
MCV: 83.2 fL (ref 80.0–100.0)
Platelets: 210 10*3/uL (ref 150–400)
RBC: 4.59 MIL/uL (ref 4.22–5.81)
RDW: 13.2 % (ref 11.5–15.5)
WBC: 8.3 10*3/uL (ref 4.0–10.5)
nRBC: 0 % (ref 0.0–0.2)

## 2019-05-04 LAB — TROPONIN I (HIGH SENSITIVITY): Troponin I (High Sensitivity): 9937 ng/L (ref <18)

## 2019-05-04 LAB — ECHOCARDIOGRAM COMPLETE
Height: 75 in
Weight: 3844.82 oz

## 2019-05-04 LAB — HIV ANTIBODY (ROUTINE TESTING W REFLEX): HIV Screen 4th Generation wRfx: NONREACTIVE

## 2019-05-04 SURGERY — LEFT HEART CATH AND CORONARY ANGIOGRAPHY
Anesthesia: LOCAL

## 2019-05-04 MED ORDER — SODIUM CHLORIDE 0.9% FLUSH: 3.0000 mL | INTRAVENOUS | Status: DC | PRN

## 2019-05-04 MED ORDER — VERAPAMIL HCL 2.5 MG/ML IV SOLN
INTRAVENOUS | Status: DC | PRN
  Administered 2019-05-04: 10 mL via INTRA_ARTERIAL

## 2019-05-04 MED ORDER — VERAPAMIL HCL 2.5 MG/ML IV SOLN
INTRAVENOUS | Status: AC
  Filled 2019-05-04: qty 2

## 2019-05-04 MED ORDER — SODIUM CHLORIDE 0.9% FLUSH
3.0000 mL | Freq: Two times a day (BID) | INTRAVENOUS | Status: DC
  Administered 2019-05-04 (×2): 3 mL via INTRAVENOUS

## 2019-05-04 MED ORDER — LIDOCAINE HCL (PF) 1 % IJ SOLN
INTRAMUSCULAR | Status: AC
  Filled 2019-05-04: qty 30

## 2019-05-04 MED ORDER — LABETALOL HCL 5 MG/ML IV SOLN: 10.0000 mg | INTRAVENOUS | Status: AC | PRN

## 2019-05-04 MED ORDER — LIDOCAINE HCL (PF) 1 % IJ SOLN
INTRAMUSCULAR | Status: DC | PRN
  Administered 2019-05-04: 2 mL via INTRADERMAL

## 2019-05-04 MED ORDER — FENTANYL CITRATE (PF) 100 MCG/2ML IJ SOLN
INTRAMUSCULAR | Status: DC | PRN
  Administered 2019-05-04: 50 ug via INTRAVENOUS

## 2019-05-04 MED ORDER — HYDRALAZINE HCL 20 MG/ML IJ SOLN: 10.0000 mg | INTRAMUSCULAR | Status: AC | PRN

## 2019-05-04 MED ORDER — HEPARIN SODIUM (PORCINE) 1000 UNIT/ML IJ SOLN
INTRAMUSCULAR | Status: AC
  Filled 2019-05-04: qty 1

## 2019-05-04 MED ORDER — NITROGLYCERIN 1 MG/10 ML FOR IR/CATH LAB
INTRA_ARTERIAL | Status: AC
  Filled 2019-05-04: qty 10

## 2019-05-04 MED ORDER — SODIUM CHLORIDE 0.9 % IV SOLN: 250.0000 mL | INTRAVENOUS | Status: DC | PRN

## 2019-05-04 MED ORDER — FENTANYL CITRATE (PF) 100 MCG/2ML IJ SOLN
INTRAMUSCULAR | Status: AC
  Filled 2019-05-04: qty 2

## 2019-05-04 MED ORDER — TICAGRELOR 90 MG PO TABS
ORAL_TABLET | ORAL | Status: DC | PRN
  Administered 2019-05-04: 180 mg via ORAL

## 2019-05-04 MED ORDER — PERFLUTREN LIPID MICROSPHERE
1.0000 mL | INTRAVENOUS | Status: AC | PRN
  Administered 2019-05-04: 14:00:00 2 mL via INTRAVENOUS
  Filled 2019-05-04: qty 10

## 2019-05-04 MED ORDER — IOHEXOL 350 MG/ML SOLN
INTRAVENOUS | Status: DC | PRN
  Administered 2019-05-04: 175 mL via INTRAVENOUS

## 2019-05-04 MED ORDER — HEPARIN SODIUM (PORCINE) 1000 UNIT/ML IJ SOLN
INTRAMUSCULAR | Status: DC | PRN
  Administered 2019-05-04: 5000 [IU] via INTRAVENOUS
  Administered 2019-05-04: 2000 [IU] via INTRAVENOUS
  Administered 2019-05-04: 5500 [IU] via INTRAVENOUS

## 2019-05-04 MED ORDER — HEPARIN (PORCINE) IN NACL 1000-0.9 UT/500ML-% IV SOLN
INTRAVENOUS | Status: DC | PRN
  Administered 2019-05-04 (×2): 500 mL

## 2019-05-04 MED ORDER — HEPARIN (PORCINE) IN NACL 1000-0.9 UT/500ML-% IV SOLN
INTRAVENOUS | Status: AC
  Filled 2019-05-04: qty 1000

## 2019-05-04 MED ORDER — SODIUM CHLORIDE 0.9 % IV SOLN: INTRAVENOUS | Status: AC

## 2019-05-04 MED ORDER — MIDAZOLAM HCL 2 MG/2ML IJ SOLN
INTRAMUSCULAR | Status: AC
  Filled 2019-05-04: qty 2

## 2019-05-04 MED ORDER — TICAGRELOR 90 MG PO TABS
ORAL_TABLET | ORAL | Status: AC
  Filled 2019-05-04: qty 2

## 2019-05-04 MED ORDER — MIDAZOLAM HCL 2 MG/2ML IJ SOLN
INTRAMUSCULAR | Status: DC | PRN
  Administered 2019-05-04: 2 mg via INTRAVENOUS

## 2019-05-04 MED ORDER — TICAGRELOR 90 MG PO TABS
90.0000 mg | ORAL_TABLET | Freq: Two times a day (BID) | ORAL | Status: DC
  Administered 2019-05-04 – 2019-05-05 (×2): 90 mg via ORAL
  Filled 2019-05-04 (×2): qty 1

## 2019-05-04 SURGICAL SUPPLY — 20 items
BALLN SAPPHIRE 2.5X12 (BALLOONS) ×2
BALLOON SAPPHIRE 2.5X12 (BALLOONS) IMPLANT
BAND CMPR LRG ZPHR (HEMOSTASIS) ×1
BAND ZEPHYR COMPRESS 30 LONG (HEMOSTASIS) ×1 IMPLANT
CATH INFINITI 5FR ANG PIGTAIL (CATHETERS) ×1 IMPLANT
CATH INFINITI JR4 5F (CATHETERS) ×1 IMPLANT
CATH OPTITORQUE TIG 4.0 5F (CATHETERS) ×1 IMPLANT
CATH VISTA GUIDE 6FR XBLAD3.5 (CATHETERS) ×1 IMPLANT
DEVICE RAD COMP TR BAND LRG (VASCULAR PRODUCTS) ×1 IMPLANT
GUIDEWIRE INQWIRE 1.5J.035X260 (WIRE) IMPLANT
INQWIRE 1.5J .035X260CM (WIRE) ×2
KIT ENCORE 26 ADVANTAGE (KITS) ×1 IMPLANT
KIT HEART LEFT (KITS) ×2 IMPLANT
PACK CARDIAC CATHETERIZATION (CUSTOM PROCEDURE TRAY) ×2 IMPLANT
SHEATH PROBE COVER 6X72 (BAG) ×1 IMPLANT
SHEATH RAIN RADIAL 21G 6FR (SHEATH) ×1 IMPLANT
STENT SYNERGY DES 2.75X20 (Permanent Stent) ×1 IMPLANT
TRANSDUCER W/STOPCOCK (MISCELLANEOUS) ×2 IMPLANT
TUBING CIL FLEX 10 FLL-RA (TUBING) ×2 IMPLANT
WIRE ASAHI PROWATER 180CM (WIRE) ×1 IMPLANT

## 2019-05-04 NOTE — Interval H&P Note (Signed)
History and Physical Interval Note:  05/04/2019 7:26 AM  Charles Mccoy  has presented today for surgery, with the diagnosis of n stemi.   The various methods of treatment have been discussed with the patient and family. After consideration of risks, benefits and other options for treatment, the patient has consented to  Procedure(s): LEFT HEART CATH AND CORONARY ANGIOGRAPHY (N/A)  PERCUTANEOUS CORONARY INTERVENTION   as a surgical intervention.  The patient's history has been reviewed, patient examined, no change in status, stable for surgery.  I have reviewed the patient's chart and labs.  Questions were answered to the patient's satisfaction.    Cath Lab Visit (complete for each Cath Lab visit)  Clinical Evaluation Leading to the Procedure:   ACS: Yes.    Non-ACS:    Anginal Classification: CCS IV  Anti-ischemic medical therapy: Minimal Therapy (1 class of medications)  Non-Invasive Test Results: No non-invasive testing performed  Prior CABG: No previous CABG    Glenetta Hew

## 2019-05-04 NOTE — Progress Notes (Signed)
Patient via bed to cath lab for procedure

## 2019-05-04 NOTE — Plan of Care (Signed)

## 2019-05-04 NOTE — Progress Notes (Signed)
Pt found to have severe LAD stenosis this am. A DES was placed in the LAD. Non-obstructive disease noted in the Circumflex and RCA. Doing well post PCI. BP is stable He is in sinus. Continue ASA/Brilinta and statin. Likely d/c home tomorrow.   Lauree Chandler 05/04/2019 9:44 AM

## 2019-05-04 NOTE — Progress Notes (Signed)
Patient returned from cath lab

## 2019-05-04 NOTE — Telephone Encounter (Signed)
Spoke with patient.

## 2019-05-05 ENCOUNTER — Telehealth: Payer: Self-pay | Admitting: Physician Assistant

## 2019-05-05 DIAGNOSIS — I2510 Atherosclerotic heart disease of native coronary artery without angina pectoris: Secondary | ICD-10-CM

## 2019-05-05 LAB — BASIC METABOLIC PANEL
Anion gap: 11 (ref 5–15)
BUN: 9 mg/dL (ref 8–23)
CO2: 22 mmol/L (ref 22–32)
Calcium: 8.4 mg/dL — ABNORMAL LOW (ref 8.9–10.3)
Chloride: 101 mmol/L (ref 98–111)
Creatinine, Ser: 0.87 mg/dL (ref 0.61–1.24)
GFR calc Af Amer: >60 mL/min (ref >60)
GFR calc non Af Amer: >60 mL/min (ref >60)
Glucose, Bld: 119 mg/dL — ABNORMAL HIGH (ref 70–99)
Potassium: 3.7 mmol/L (ref 3.5–5.1)
Sodium: 134 mmol/L — ABNORMAL LOW (ref 135–145)

## 2019-05-05 LAB — CBC
HCT: 40.1 % (ref 39.0–52.0)
Hemoglobin: 13.7 g/dL (ref 13.0–17.0)
MCH: 28.7 pg (ref 26.0–34.0)
MCHC: 34.2 g/dL (ref 30.0–36.0)
MCV: 83.9 fL (ref 80.0–100.0)
Platelets: 240 10*3/uL (ref 150–400)
RBC: 4.78 MIL/uL (ref 4.22–5.81)
RDW: 13.2 % (ref 11.5–15.5)
WBC: 9.2 10*3/uL (ref 4.0–10.5)
nRBC: 0 % (ref 0.0–0.2)

## 2019-05-05 MED ORDER — NITROGLYCERIN 0.4 MG SL SUBL: 0.4000 mg | SUBLINGUAL_TABLET | SUBLINGUAL | 0 refills | Status: AC | PRN

## 2019-05-05 MED ORDER — TICAGRELOR 90 MG PO TABS: 90.0000 mg | ORAL_TABLET | Freq: Two times a day (BID) | ORAL | 3 refills | Status: DC

## 2019-05-05 MED ORDER — ATORVASTATIN CALCIUM 80 MG PO TABS: 80.0000 mg | ORAL_TABLET | Freq: Every day | ORAL | 1 refills | Status: DC

## 2019-05-05 MED FILL — Nitroglycerin IV Soln 100 MCG/ML in D5W: INTRA_ARTERIAL | Qty: 10 | Status: AC

## 2019-05-05 MED FILL — ATORVASTATIN CALCIUM 80 MG: 80 | 30 days supply | Qty: 30 | Fill #0

## 2019-05-05 MED FILL — BRILINTA 90 MG TABLET: 90 | 30 days supply | Qty: 60 | Fill #0

## 2019-05-05 MED FILL — NITROGLYCERIN 0.4 MG TAB SL: 0.4 | 8 days supply | Qty: 25 | Fill #0

## 2019-05-05 NOTE — Discharge Summary (Signed)
Discharge Summary    Patient ID: Charles Mccoy MRN: [id number]; DOB: Aug 17, 1951  Admit date: 05/03/2019 Discharge date: 05/05/2019  Primary Care Provider: Isaac Mccoy, Charles Halsted, MD  Primary Cardiologist: Charles Moores, MD  Primary Electrophysiologist:  Charles Peru, MD   Discharge Diagnoses    Principal Problem:   NSTEMI (non-ST elevated myocardial infarction) [facility]) Active Problems:   Essential hypertension   Hyperlipidemia   CAD (coronary artery disease)   Allergies Allergies  Allergen Reactions  . Erythromycin Base Other (See Comments)    REACTION: stomach irritation    Diagnostic Studies/Procedures    Cardiac cath 05/04/19:  CULPRIT LESION: Mid LAD lesion is 99% stenosed.-Subtotal occluded with right to-left collaterals  A drug-eluting stent was successfully placed using a STENT SYNERGY DES 2.75X20. -Postdilated to 3.0 mm  Post intervention, there is a 0% residual stenosis.  -------------------------------------------------------  Prox Cx to Mid Cx lesion is 55% stenosed.  Dist RCA lesion is 30% stenosed.  The left ventricular systolic function is normal. The left ventricular ejection fraction is 55-65% by visual estimate. LV end diastolic pressure is normal.  SUMMARY  Severe single-vessel disease with LAD 99% subtotal occlusion (significant right - left collaterals) ? Successful DES PCI of LAD using Synergy DES 2.75 mm at 20 mm (3.0 mm)  Moderate circumflex disease but otherwise minimal disease.  Preserved LVEF with apical hypokinesis, and normal EDP.  Echo 05/04/19: 1. The left ventricle has hyperdynamic systolic function, with an ejection fraction of >65%. The cavity size was normal. There is mildly increased left ventricular wall thickness. Left ventricular diastolic Doppler parameters are consistent with  impaired relaxation. No evidence of left ventricular regional wall motion abnormalities. 2. The right ventricle has normal systolic function.  The cavity was normal. There is no increase in right ventricular wall thickness. 3. The mitral valve is grossly normal. 4. The tricuspid valve is grossly normal. 5. The aortic valve was not well visualized. No stenosis of the aortic valve. 6. The aorta is normal in size and structure. 7. The inferior vena cava was dilated in size with <50% respiratory variability. _____________   History of Present Illness     Charles Mccoy is a 68 yo male with pmhx of HTN, HLD, CVA (IRL planted without evidence of arrhythmias on ASA), and obesity. He was in his usual state of health until he began experiencing chest heaviness/pressure/tightness with associated diaphoresis while mowing his lawn. Symptoms had been intermittent over the last 2 weeks. Pain would start in his back and radiate to his chest. He was having associated sob and diaphoresis. Patient would regularly check his B/P which would be 130s-150s/70-80s.He never experienced dizziness, orthopnea, PND, LE edema, palpitations, lightheadedness, or syncope.   He was seen by his PCP the week prior with similar complaints of chest pain radiating from back to front while power washing. Patient had not had an ischemic evaluation in the past. He was referred for a CTA chest which did not reveal dissection/aneurysm of his aorta, but did reveal moderate mixed calcific atherosclerosis and 3-vessel coronary artery calcifications. Given recurrent pain and plans to travel to [place] tomorrow, he decided to present to the ED for evaluation  Hospital Course     Consultants:  None  Patient arrived to the ED and was chest pain free. Patient was hypertensive, otherwise VSS. Labs revealed electrolytes wnl, Cr 0.86, CBC wnl, Trop 1900. CXR without acute findings. EKG showed NSR, 97bpm, possible anteroseptal infarct (age undertermined), no ST elevation, isolated TWI in AVL, QTc  429. He was given ASA 324 and started on nitro paste. He was admitted for NSTEMI and cardiology was  consulted.   HS troponin trended 1900>8583>13,984 and patient was set up for a LHC. Patient was placed on low dose Carvedilol. The next day patinet was brought to the cath lab for Left Heart Cath and Coronary angiography with possible intervention. Right radial access was established.Cath showed severe single-vessel disease with LAD 99% that was successfully treated with DES x 1, moderate circumflex disease 55%, and RCA stenosis of 30%, Preserved LVEF with apical hypokinesis, and normal EDP. B/P, heart rate, and oxygen remained stable during procedure. Follow-up 2D echo showed LVEF >65% with minimal diastolic dysfunction, inferior vena cava was dilated with <50% respiratory variability.   Patient was placed on ASA and Brilinta x 1 year. Further risk stratification work-up revealed LDL 69, HDL 32, TG 94. Atorvastatin was increased from 40mg  to 80mg  daily. A1C 5.5. BB was stopped due to nocturnal bradycardia. Right radial site remained clean and dry with no complications. HGB remained stable 13.7. Amlodipine and Aldactone were continued as previously prescribed. Patient worked with Cardiac Rehab and was able to ambulate without chest pain.   Patient was seen and examined by Dr. Angelena Mccoy on 05/05/19 and felt to be stable for discharge with a follow-up in 1 week.   _____________  Discharge Vitals Blood pressure 122/74, pulse 66, temperature 98.4 F (36.9 C), temperature source Oral, resp. rate 18, height 6\' 3"  (1.905 m), weight 107.9 kg, SpO2 93 %.  Filed Weights   05/03/19 2053 05/04/19 0500 05/05/19 0500  Weight: 109.1 kg 109 kg 107.9 kg    Labs & Radiologic Studies    CBC Recent Labs    05/04/19 0119 05/05/19 0302  WBC 8.3 9.2  HGB 13.3 13.7  HCT 38.2* 40.1  MCV 83.2 83.9  PLT 210 706   Basic Metabolic Panel Recent Labs    05/04/19 0119 05/05/19 0302  NA 133* 134*  K 3.7 3.7  CL 100 101  CO2 23 22  GLUCOSE 138* 119*  BUN 13 9  CREATININE 0.88 0.87  CALCIUM 8.2* 8.4*   Liver  Function Tests No results for input(s): AST, ALT, ALKPHOS, BILITOT, PROT, ALBUMIN in the last 72 hours. No results for input(s): LIPASE, AMYLASE in the last 72 hours. Cardiac Enzymes No results for input(s): CKTOTAL, CKMB, CKMBINDEX, TROPONINI in the last 72 hours. BNP Invalid input(s): POCBNP D-Dimer No results for input(s): DDIMER in the last 72 hours. Hemoglobin A1C Recent Labs    05/04/19 0119  HGBA1C 5.5   Fasting Lipid Panel Recent Labs    05/04/19 0119  CHOL 120  HDL 32*  LDLCALC 69  TRIG 94  CHOLHDL 3.8   Thyroid Function Tests No results for input(s): TSH, T4TOTAL, T3FREE, THYROIDAB in the last 72 hours.  Invalid input(s): FREET3 _____________  Dg Chest 2 View  Result Date: 05/03/2019 CLINICAL DATA:  Chest pain, hypertension EXAM: CHEST - 2 VIEW COMPARISON:  07/12/2014 FINDINGS: Loop recorder device is present. The heart size and mediastinal contours are within normal limits. Calcific aortic knob. Both lungs are clear. Mild multilevel degenerative changes of the thoracic spine. IMPRESSION: No active cardiopulmonary disease. Electronically Signed   By: Davina Poke M.D.   On: 05/03/2019 13:36   Ct Angio Chest Aorta W/cm &/or Wo/cm  Result Date: 04/29/2019 CLINICAL DATA:  Suspected aortic dissection, back pain radiating to chest and sternum EXAM: CT ANGIOGRAPHY CHEST WITH CONTRAST TECHNIQUE: Multidetector CT imaging of the  chest was performed using the standard protocol during bolus administration of intravenous contrast. Multiplanar CT image reconstructions and MIPs were obtained to evaluate the vascular anatomy. CONTRAST:  40mL ISOVUE-370 IOPAMIDOL (ISOVUE-370) INJECTION 76% COMPARISON:  None. FINDINGS: Cardiovascular: Preferential opacification of the thoracic aorta. The descending thoracic aorta is mildly enlarged measuring up to 3.1 x 3.1 cm. Otherwise normal contour and caliber of the aorta. No evidence of ascending aneurysm, dissection, penetrating ulceration, or  other acute aortic syndrome. Moderate mixed calcific atherosclerosis. Normal heart size. Three-vessel coronary artery calcifications. No pericardial effusion. Mediastinum/Nodes: No enlarged mediastinal, hilar, or axillary lymph nodes. Thyroid gland, trachea, and esophagus demonstrate no significant findings. Lungs/Pleura: Lungs are clear. 5 mm nodule of the right middle lobe (series 8, image 95). 3 mm subpleural nodule of the left lower lobe (series 8, image 116). Small, benign fissural nodule of the right upper lobe (series 8, image 80). No pleural effusion or pneumothorax. Upper Abdomen: No acute abnormality. Musculoskeletal: No chest wall abnormality. No acute or significant osseous findings. Review of the MIP images confirms the above findings. IMPRESSION: 1. The descending thoracic aorta is mildly enlarged measuring up to 3.1 x 3.1 cm. Otherwise normal contour and caliber of the aorta. No evidence of ascending aneurysm, dissection, penetrating ulceration, or other acute aortic syndrome. Moderate mixed calcific atherosclerosis. 2. There are small nonspecific pulmonary nodules, 5 mm nodule of the right middle lobe (series 8, image 95). 3 mm subpleural nodule of the left lower lobe (series 8, image 116). CT follow-up of these nodules is optional at 1 year if high risk factors lung cancer are present, such as cigarette smoking. 3.  Coronary artery disease. Electronically Signed   By: Eddie Candle M.D.   On: 04/29/2019 10:15   Disposition   Pt is being discharged home today in good condition.  Follow-up Plans & Appointments    Follow-up Information    Daune Perch, NP Follow up on 05/17/2019.   Specialty: Cardiology Why: appointment at 9:00 am Contact information: [place] [address] [phone]          Discharge Instructions    Amb Referral to Cardiac Rehabilitation   Complete by: As directed    Diagnosis:  NSTEMI Coronary Stents     After initial  evaluation and assessments completed: Virtual Based Care may be provided alone or in conjunction with Phase 2 Cardiac Rehab based on patient barriers.: Yes      Discharge Medications   Allergies as of 05/05/2019      Reactions   Erythromycin Base Other (See Comments)   REACTION: stomach irritation      Medication List    TAKE these medications   amLODipine 10 MG tablet Commonly known as: NORVASC TAKE 1 TABLET BY MOUTH EVERY DAY IN THE MORNING What changed:   how much to take  how to take this  when to take this   aspirin 81 MG EC tablet Take 1 tablet (81 mg total) by mouth daily.   atorvastatin 80 MG tablet Commonly known as: LIPITOR Take 1 tablet (80 mg total) by mouth daily at 6 PM. What changed:   medication strength  how much to take   azelastine 0.1 % nasal spray Commonly known as: ASTELIN Place 1 spray into both nostrils as needed for allergies.   fexofenadine 60 MG tablet Commonly known as: ALLEGRA Take 60 mg by mouth as needed for allergies.   Fluticasone-Salmeterol 232-14 MCG/ACT Aepb Inhale 1 puff into the  lungs as needed (shortness of breath).   nitroGLYCERIN 0.4 MG SL tablet Commonly known as: NITROSTAT Place 1 tablet (0.4 mg total) under the tongue every 5 (five) minutes as needed for chest pain.   pantoprazole 40 MG tablet Commonly known as: PROTONIX Take 1 tablet (40 mg total) by mouth daily.   spironolactone 25 MG tablet Commonly known as: ALDACTONE Take 1 tablet (25 mg total) by mouth daily.   ticagrelor 90 MG Tabs tablet Commonly known as: BRILINTA Take 1 tablet (90 mg total) by mouth 2 (two) times daily.        Acute coronary syndrome (MI, NSTEMI, STEMI, etc) this admission?: Yes.     AHA/ACC Clinical Performance & Quality Measures: 1. Aspirin prescribed? - Yes 2. ADP Receptor Inhibitor (Plavix/Clopidogrel, Brilinta/Ticagrelor or Effient/Prasugrel) prescribed (includes medically managed patients)? - Yes 3. Beta Blocker  prescribed? - No - nocturnal bradycardia 4. High Intensity Statin (Lipitor 40-80mg  or Crestor 20-40mg ) prescribed? - Yes 5. EF assessed during THIS hospitalization? - Yes 6. For EF <40%, was ACEI/ARB prescribed? - Not Applicable (EF >/= 54%) 7. For EF <40%, Aldosterone Antagonist (Spironolactone or Eplerenone) prescribed? - Not Applicable (EF >/= 62%) 8. Cardiac Rehab Phase II ordered (Included Medically managed Patients)? - Yes     Outstanding Labs/Studies   Recheck Lipids and LFTS in 6-8 weeks  Duration of Discharge Encounter   Greater than 30 minutes including physician time.  Signed, Kevonna Nolte Ninfa Meeker, PA-C 05/05/2019, 10:02 AM

## 2019-05-05 NOTE — Telephone Encounter (Signed)
Patient was discharged home today after non-STEMI.  After going home, he was extremely fatigued.  He developed chills.  He had the cover himself with extra blankets.  He went to bed and slept most of the day.  After he got up, he felt some better, but still did not feel well.  He had not checked his temperature previously, but started monitoring it.  He got several readings between 100.4 and 101 F.  He is not having any other symptoms except a dry cough.  He tested negative for COVID on 7/27.  Advised him to take Tylenol and rest at home tonight.  If he is still febrile tomorrow, he needs to call us or his family physician and get retested for COVID.  He was requested to quarantine himself until the situation is clarified.  Rosaria Ferries, PA-C 05/05/2019 8:46 PM Beeper [phone]

## 2019-05-05 NOTE — Plan of Care (Signed)
  Problem: Education: Goal: Knowledge of General Education information will improve Description: Including pain rating scale, medication(s)/side effects and non-pharmacologic comfort measures Outcome: Adequate for Discharge   Problem: Health Behavior/Discharge Planning: Goal: Ability to manage health-related needs will improve Outcome: Adequate for Discharge   Problem: Clinical Measurements: Goal: Ability to maintain clinical measurements within normal limits will improve Outcome: Adequate for Discharge Goal: Will remain free from infection Outcome: Adequate for Discharge Goal: Diagnostic test results will improve Outcome: Adequate for Discharge Goal: Respiratory complications will improve Outcome: Adequate for Discharge Goal: Cardiovascular complication will be avoided Outcome: Adequate for Discharge   Problem: Activity: Goal: Risk for activity intolerance will decrease Outcome: Adequate for Discharge   Problem: Nutrition: Goal: Adequate nutrition will be maintained Outcome: Adequate for Discharge   Problem: Coping: Goal: Level of anxiety will decrease Outcome: Adequate for Discharge   Problem: Elimination: Goal: Will not experience complications related to bowel motility Outcome: Adequate for Discharge Goal: Will not experience complications related to urinary retention Outcome: Adequate for Discharge   Problem: Pain Managment: Goal: General experience of comfort will improve Outcome: Adequate for Discharge   Problem: Safety: Goal: Ability to remain free from injury will improve Outcome: Adequate for Discharge   Problem: Skin Integrity: Goal: Risk for impaired skin integrity will decrease Outcome: Adequate for Discharge   Problem: Education: Goal: Understanding of CV disease, CV risk reduction, and recovery process will improve Outcome: Adequate for Discharge Goal: Individualized Educational Video(s) Outcome: Adequate for Discharge   Problem:  Activity: Goal: Ability to return to baseline activity level will improve Outcome: Adequate for Discharge   Problem: Cardiovascular: Goal: Ability to achieve and maintain adequate cardiovascular perfusion will improve Outcome: Adequate for Discharge Goal: Vascular access site(s) Level 0-1 will be maintained Outcome: Adequate for Discharge   Problem: Health Behavior/Discharge Planning: Goal: Ability to safely manage health-related needs after discharge will improve Outcome: Adequate for Discharge    DC education given to patient questions answered. Reviewed s/s, site care, new medication regimen, and f/u appts. PIV DC x2, hemostasis achieved. VSS. Brilinta card given. All belongings sent home with patient at time of DC.   Pt escorted by NT via wheelchair to private vehicle driven by friend.

## 2019-05-05 NOTE — Progress Notes (Addendum)
Progress Note  Patient Name: Charles Mccoy Date of Encounter: 05/05/2019  Primary Cardiologist: Lovena Le  Subjective   No chest pain or dyspnea. No events overnight.   Inpatient Medications    Scheduled Meds: . amLODipine  10 mg Oral Daily  . aspirin EC  81 mg Oral Daily  . atorvastatin  80 mg Oral q1800  . carvedilol  3.125 mg Oral BID WC  . pantoprazole  40 mg Oral Daily  . sodium chloride flush  3 mL Intravenous Q12H  . sodium chloride flush  3 mL Intravenous Q12H  . ticagrelor  90 mg Oral BID   Continuous Infusions: . sodium chloride     PRN Meds: sodium chloride, acetaminophen, fluticasone furoate-vilanterol, nitroGLYCERIN, ondansetron (ZOFRAN) IV, pneumococcal 23 valent vaccine, sodium chloride flush   Vital Signs    Vitals:   05/04/19 1954 05/05/19 0000 05/05/19 0343 05/05/19 0500  BP: (!) 153/75 135/73 125/79   Pulse: 73 72 72   Resp: 16 15 18    Temp: 98.3 F (36.8 C) 99.8 F (37.7 C) 98.3 F (36.8 C)   TempSrc: Oral Oral Oral   SpO2: 94% 93% 93%   Weight:    107.9 kg  Height:        Intake/Output Summary (Last 24 hours) at 05/05/2019 0650 Last data filed at 05/04/2019 2111 Gross per 24 hour  Intake 243 ml  Output 2475 ml  Net -2232 ml   Last 3 Weights 05/05/2019 05/04/2019 05/03/2019  Weight (lbs) 237 lb 14 oz 240 lb 4.8 oz 240 lb 8.4 oz  Weight (kg) 107.9 kg 109 kg 109.1 kg      Telemetry    sinus - Personally Reviewed  ECG    Sinus, T wave abn- Personally Reviewed  Physical Exam   GEN: No acute distress.   Neck: No JVD Cardiac: RRR, no murmurs, rubs, or gallops.  Respiratory: Clear to auscultation bilaterally. GI: Soft, nontender, non-distended  MS: No edema; No deformity. Neuro:  Nonfocal  Psych: Normal affect   Labs    High Sensitivity Troponin:   Recent Labs  Lab 05/03/19 1245 05/03/19 1444 05/03/19 2229 05/04/19 0119  TROPONINIHS 1,900* 8,583* 13,984* 9,937*      Cardiac EnzymesNo results for input(s): TROPONINI in the  last 168 hours. No results for input(s): TROPIPOC in the last 168 hours.   Chemistry Recent Labs  Lab 05/03/19 1245 05/04/19 0119 05/05/19 0302  NA 136 133* 134*  K 4.2 3.7 3.7  CL 102 100 101  CO2 23 23 22   GLUCOSE 97 138* 119*  BUN 12 13 9   CREATININE 0.86 0.88 0.87  CALCIUM 9.3 8.2* 8.4*  GFRNONAA >60 >60 >60  GFRAA >60 >60 >60  ANIONGAP 11 10 11      Hematology Recent Labs  Lab 05/03/19 1245 05/04/19 0119 05/05/19 0302  WBC 8.9 8.3 9.2  RBC 5.24 4.59 4.78  HGB 15.1 13.3 13.7  HCT 44.7 38.2* 40.1  MCV 85.3 83.2 83.9  MCH 28.8 29.0 28.7  MCHC 33.8 34.8 34.2  RDW 13.2 13.2 13.2  PLT 259 210 240    BNPNo results for input(s): BNP, PROBNP in the last 168 hours.   DDimer No results for input(s): DDIMER in the last 168 hours.   Radiology    Dg Chest 2 View  Result Date: 05/03/2019 CLINICAL DATA:  Chest pain, hypertension EXAM: CHEST - 2 VIEW COMPARISON:  07/12/2014 FINDINGS: Loop recorder device is present. The heart size and mediastinal contours are within normal limits.  Calcific aortic knob. Both lungs are clear. Mild multilevel degenerative changes of the thoracic spine. IMPRESSION: No active cardiopulmonary disease. Electronically Signed   By: Davina Poke M.D.   On: 05/03/2019 13:36    Cardiac Studies   Cardiac cath 05/04/19:  CULPRIT LESION: Mid LAD lesion is 99% stenosed.-Subtotal occluded with right to-left collaterals  A drug-eluting stent was successfully placed using a STENT SYNERGY DES 2.75X20. -Postdilated to 3.0 mm  Post intervention, there is a 0% residual stenosis.  -------------------------------------------------------  Prox Cx to Mid Cx lesion is 55% stenosed.  Dist RCA lesion is 30% stenosed.  The left ventricular systolic function is normal. The left ventricular ejection fraction is 55-65% by visual estimate. LV end diastolic pressure is normal.   SUMMARY  Severe single-vessel disease with LAD 99% subtotal occlusion (significant  right - left collaterals) ? Successful DES PCI of LAD using Synergy DES 2.75 mm at 20 mm (3.0 mm)  Moderate circumflex disease but otherwise minimal disease.  Preserved LVEF with apical hypokinesis, and normal EDP.  Echo 05/04/19:  1. The left ventricle has hyperdynamic systolic function, with an ejection fraction of >65%. The cavity size was normal. There is mildly increased left ventricular wall thickness. Left ventricular diastolic Doppler parameters are consistent with  impaired relaxation. No evidence of left ventricular regional wall motion abnormalities.  2. The right ventricle has normal systolic function. The cavity was normal. There is no increase in right ventricular wall thickness.  3. The mitral valve is grossly normal.  4. The tricuspid valve is grossly normal.  5. The aortic valve was not well visualized. No stenosis of the aortic valve.  6. The aorta is normal in size and structure.  7. The inferior vena cava was dilated in size with <50% respiratory variability.   Patient Profile     68 y.o. male with history of CVA, HTN, HLD and former tobacco abuse admitted with a NSTEMI. Cardiac cath 05/04/19 with a severe stenosis in the LAD treated with a drug eluting stent.   Assessment & Plan    1. CAD/NSTEMI: Cath yesterday with severe stenosis in the LAD treated with a drug eluting stent. Moderate Circumflex stenosis. LV systolic function normal by echo. Continue DAPT with ASA and Brilinta for one year. Continue statin. (increase lipitor from home dose of 40 mg to 80 mg). Beta blocker stopped due to nocturnal bradycardia.   2. HTN: BP stable. Continue Norvasc. Resume home dose of aldactone.   D/C home today. Follow up one week.   For questions or updates, please contact [place] Please consult [email or web address] for contact info under        Signed, Lauree Chandler, MD  05/05/2019, 6:50 AM

## 2019-05-05 NOTE — Discharge Instructions (Signed)
No driving for 3 days. No lifting over 10 lbs for 2 weeks. No sexual activity for 2 weeks. You may return to work after clearance from cardiologist. Keep procedure site clean & dry. If you notice increased pain, swelling, bleeding or pus, call/return!  You may shower, but no soaking baths/hot tubs/pools for 1 week.

## 2019-05-05 NOTE — Progress Notes (Signed)
CARDIAC REHAB PHASE I   PRE:  Rate/Rhythm: 89 SR  BP:  Supine:   Sitting: 122/74  Standing:    SaO2:   MODE:  Ambulation: 340 ft   POST:  Rate/Rhythm: 89 SR  BP:  Supine:   Sitting: 117/75  Standing:    SaO2: 98%RA 0810-0910 Pt walked 340 ft on RA with steady gait and tolerated well. No CP. MI education completed with pt who voiced understanding. Stressed importance of brilinta with stent. Needs to see case manager. Reviewed NTG use, MI restrictions, heart healthy food choices, ex ed, and CRP 2. Referred to [place] CRP 2.  Pt is interested in participating in Virtual Cardiac Rehab. Pt advised that Virtual Cardiac Rehab is provided at no cost to the patient.  Checklist:  1. Pt has smart device  ie smartphone and/or ipad for downloading an app  Yes 2. Reliable internet/wifi service    Yes 3. Understands how to use their smartphone and navigate within an app.  Yes   Reviewed with pt the scheduling process for virtual cardiac rehab.  Pt verbalized understanding.    Graylon Good, RN BSN  05/05/2019 9:06 AM

## 2019-05-06 ENCOUNTER — Other Ambulatory Visit: Payer: Self-pay

## 2019-05-06 ENCOUNTER — Telehealth: Payer: Self-pay | Admitting: *Deleted

## 2019-05-06 DIAGNOSIS — R6889 Other general symptoms and signs: Secondary | ICD-10-CM | POA: Diagnosis not present

## 2019-05-06 DIAGNOSIS — Z20822 Contact with and (suspected) exposure to covid-19: Secondary | ICD-10-CM

## 2019-05-06 DIAGNOSIS — R509 Fever, unspecified: Secondary | ICD-10-CM

## 2019-05-06 NOTE — Telephone Encounter (Signed)
Patient released from hospital 05/05/2019.  Patient complains of a fever 100.4 -101.0 with cough yesterday afternoon.  Patient began taking Tylenol after speaking with his cardiologist, with is temp dropping to  - 98.5. This morning his temperature had increased to 99.0 with chills.  Patient has taken tylenol and temp has decreased to 98.3 - 97.7.  Per Dr Jerilee Hoh patient should have a covid test.  Patient agrees with recommendation.  Covid test placed and virtual visit scheduled for 05/07/2019.

## 2019-05-06 NOTE — Telephone Encounter (Signed)
Contacted patient He is still having fevers, low-grade. Tylenol helps, but temp goes back up after it wears off.   No more nausea, cough has improved but still there. Denies SOB, but resp rate up a little on the phone.  He is agreeable to contacting his PCP. Consider retest for Covid.  Rosaria Ferries, PA-C 05/06/2019 9:52 AM Beeper [phone]

## 2019-05-06 NOTE — Telephone Encounter (Signed)
Can we check on him today? He should not be febrile from his heart procedure. If he is febrile today, would recommend that he stay at home and notify primary care if he were to become dyspneic. Thanks, chris

## 2019-05-06 NOTE — Telephone Encounter (Signed)
This note was routed to me by mistake

## 2019-05-07 ENCOUNTER — Ambulatory Visit: Payer: Self-pay | Admitting: Internal Medicine

## 2019-05-07 ENCOUNTER — Other Ambulatory Visit: Payer: Self-pay

## 2019-05-07 ENCOUNTER — Ambulatory Visit (INDEPENDENT_AMBULATORY_CARE_PROVIDER_SITE_OTHER): Payer: Medicare Other | Admitting: Internal Medicine

## 2019-05-07 DIAGNOSIS — R509 Fever, unspecified: Secondary | ICD-10-CM | POA: Diagnosis not present

## 2019-05-07 DIAGNOSIS — Z8673 Personal history of transient ischemic attack (TIA), and cerebral infarction without residual deficits: Secondary | ICD-10-CM | POA: Diagnosis not present

## 2019-05-07 NOTE — Progress Notes (Signed)
Virtual Visit via Video Note  I connected with Charles Mccoy on 05/07/19 at  3:00 PM EDT by a video enabled telemedicine application and verified that I am speaking with the correct person using two identifiers.  Location patient: home Location provider: work office Persons participating in the virtual visit: patient, provider  I discussed the limitations of evaluation and management by telemedicine and the availability of in person appointments. The patient expressed understanding and agreed to proceed.   HPI: He has scheduled this visit due to some acute concerns.  He has had a very busy week.  He saw me on 7/23 with some upper back pain and left arm pain that concerned me for the possibility of aortic dissection so I sent him for a CTA of the chest which did not show aneurysm or dissection.  Over the weekend he continued to have this discomfort now with chest pain and diaphoresis so he presented to the emergency department where he was found to have a non-ST elevated MI, underwent catheterization and had stent placement.  He was discharged from the hospital 2 days ago and as soon as he returned home he started having fever and chills.  His fever was 101-100.4.  He called cardiology who advised him to take some Tylenol and to follow-up with me in the office today.  He last took Tylenol at 5:30 PM yesterday afternoon.  Since then his temperature has ranged between 97.8 and 99.6.  It was 98 an hour before our appointment.  He had a negative COVID test in the hospital on 7/27.  He was sent for repeat COVID testing at my urging yesterday.  He has a slight cough that is not productive, no headache, no other URI symptoms, no nausea, no vomiting.  No urinary or bowel symptoms.   ROS: Constitutional: Denies  diaphoresis, appetite change and fatigue.  HEENT: Denies photophobia, eye pain, redness, hearing loss, ear pain, congestion, sore throat, rhinorrhea, sneezing, mouth sores, trouble swallowing,  neck pain, neck stiffness and tinnitus.   Respiratory: Denies SOB, DOE, chest tightness,  and wheezing.   Cardiovascular: Denies chest pain, palpitations and leg swelling.  Gastrointestinal: Denies nausea, vomiting, abdominal pain, diarrhea, constipation, blood in stool and abdominal distention.  Genitourinary: Denies dysuria, urgency, frequency, hematuria, flank pain and difficulty urinating.  Endocrine: Denies: hot or cold intolerance, sweats, changes in hair or nails, polyuria, polydipsia. Musculoskeletal: Denies myalgias, back pain, joint swelling, arthralgias and gait problem.  Skin: Denies pallor, rash and wound.  Neurological: Denies dizziness, seizures, syncope, weakness, light-headedness, numbness and headaches.  Hematological: Denies adenopathy. Easy bruising, personal or family bleeding history  Psychiatric/Behavioral: Denies suicidal ideation, mood changes, confusion, nervousness, sleep disturbance and agitation   Past Medical History:  Diagnosis Date  . ALLERGIC RHINITIS 06/19/2007  . ASTHMA 08/17/2008  . Back pain 05/2016  . COPD (chronic obstructive pulmonary disease) ([place])   . GERD (gastroesophageal reflux disease)   . HYPERTENSION 06/19/2007  . OSTEOARTHRITIS 10/09/2009   wrists, knee.  . Pneumonia    hx of   . SINUSITIS 09/15/2007   recent issuses 2 weeks ago-clearing.  . Stroke [facility])     Past Surgical History:  Procedure Laterality Date  . CORONARY STENT INTERVENTION N/A 05/04/2019   Procedure: CORONARY STENT INTERVENTION;  Surgeon: Leonie Man, MD;  Location: [place];  Service: Cardiovascular;  Laterality: N/A;  . cyst removed from neck      age 63   . KNEE ARTHROSCOPY  2 on left knee , 1 on right  . LEFT HEART CATH AND CORONARY ANGIOGRAPHY N/A 05/04/2019   Procedure: LEFT HEART CATH AND CORONARY ANGIOGRAPHY;  Surgeon: Leonie Man, MD;  Location: [place] [facility];  Service: Cardiovascular;  Laterality: N/A;  . LOOP RECORDER INSERTION N/A  01/28/2018   Procedure: LOOP RECORDER INSERTION;  Surgeon: Evans Lance, MD;  Location: [facility];  Service: Cardiovascular;  Laterality: N/A;  . TEE WITHOUT CARDIOVERSION N/A 01/28/2018   Procedure: TRANSESOPHAGEAL ECHOCARDIOGRAM (TEE);  Surgeon: Acie Fredrickson Wonda Cheng, MD;  Location: [facility];  Service: Cardiovascular;  Laterality: N/A;  . TOTAL KNEE ARTHROPLASTY Left 07/21/2014   Procedure: LEFT TOTAL KNEE ARTHROPLASTY;  Surgeon: Johnn Hai, MD;  Location: [facility];  Service: Orthopedics;  Laterality: Left;  . TOTAL KNEE ARTHROPLASTY Right 05/30/2016   Procedure: RIGHT TOTAL KNEE ARTHROPLASTY REPLACEMENT;  Surgeon: Susa Day, MD;  Location: [facility];  Service: Orthopedics;  Laterality: Right;    Family History  Problem Relation Age of Onset  . Colon cancer Neg Hx   . Esophageal cancer Neg Hx   . Rectal cancer Neg Hx   . Stomach cancer Neg Hx     SOCIAL HX:   reports that he quit smoking about 17 years ago. His smoking use included cigarettes. He has never used smokeless tobacco. He reports current alcohol use of about 6.0 standard drinks of alcohol per week. He reports that he does not use drugs.   Current Outpatient Medications:  .  amLODipine (NORVASC) 10 MG tablet, TAKE 1 TABLET BY MOUTH EVERY DAY IN THE MORNING (Patient taking differently: Take 10 mg by mouth daily. TAKE 1 TABLET BY MOUTH EVERY DAY IN THE MORNING), Disp: 90 tablet, Rfl: 1 .  aspirin 81 MG EC tablet, Take 1 tablet (81 mg total) by mouth daily., Disp: 90 tablet, Rfl: 3 .  atorvastatin (LIPITOR) 80 MG tablet, Take 1 tablet (80 mg total) by mouth daily at 6 PM., Disp: 90 tablet, Rfl: 1 .  azelastine (ASTELIN) 0.1 % nasal spray, Place 1 spray into both nostrils as needed for allergies. , Disp: , Rfl: 5 .  fexofenadine (ALLEGRA) 60 MG tablet, Take 60 mg by mouth as needed for allergies. , Disp: , Rfl:  .  Fluticasone-Salmeterol 232-14 MCG/ACT AEPB, Inhale 1 puff into the lungs as needed (shortness of breath).  , Disp: , Rfl: 5 .  nitroGLYCERIN (NITROSTAT) 0.4 MG SL tablet, Place 1 tablet (0.4 mg total) under the tongue every 5 (five) minutes as needed for chest pain., Disp: 25 tablet, Rfl: 0 .  pantoprazole (PROTONIX) 40 MG tablet, Take 1 tablet (40 mg total) by mouth daily., Disp: 90 tablet, Rfl: 1 .  spironolactone (ALDACTONE) 25 MG tablet, Take 1 tablet (25 mg total) by mouth daily., Disp: 90 tablet, Rfl: 1 .  ticagrelor (BRILINTA) 90 MG TABS tablet, Take 1 tablet (90 mg total) by mouth 2 (two) times daily., Disp: 180 tablet, Rfl: 3  EXAM:   VITALS per patient if applicable: Temperature of 98.0  GENERAL: alert, oriented, appears well and in no acute distress  HEENT: atraumatic, conjunttiva clear, no obvious abnormalities on inspection of external nose and ears  NECK: normal movements of the head and neck  LUNGS: on inspection no signs of respiratory distress, breathing rate appears normal, no obvious gross increased work of breathing, gasping or wheezing  CV: no obvious cyanosis  MS: moves all visible extremities without noticeable abnormality  PSYCH/NEURO: pleasant and cooperative,  no obvious depression or anxiety, speech and thought processing grossly intact  ASSESSMENT AND PLAN:   Fever, unspecified fever cause -His symptoms appear to be improving over the last 24 hours. -Etiology remains unclear.  Of course in the era of the COVID-19 pandemic, this needs to be forefront in our mind even though he had a negative COVID test recently. -He was sent for repeat COVID testing yesterday, outpatient results may take up to 7 days to return. -He does not have a productive cough or extreme fatigue so I think pneumonia is unlikely at this stage.  However I have advised him to closely monitor his fever curve and his symptoms over the weekend.  If he were to worsen over the weekend, then it would be prudent for him to return to the hospital for evaluation, otherwise will check in with him early next  week.    I discussed the assessment and treatment plan with the patient. The patient was provided an opportunity to ask questions and all were answered. The patient agreed with the plan and demonstrated an understanding of the instructions.   The patient was advised to call back or seek an in-person evaluation if the symptoms worsen or if the condition fails to improve as anticipated.    Lelon Frohlich, MD  [facility]

## 2019-05-08 ENCOUNTER — Other Ambulatory Visit: Payer: Self-pay | Admitting: Adult Health

## 2019-05-08 LAB — NOVEL CORONAVIRUS, NAA: SARS-CoV-2, NAA: NOT DETECTED

## 2019-05-10 ENCOUNTER — Telehealth (HOSPITAL_COMMUNITY): Payer: Self-pay

## 2019-05-10 NOTE — Telephone Encounter (Signed)
Pt insurance is active and benefits verified through Medicare a/b Co-pay 0, DED $198/$198 met, out of pocket 0/0 met, co-insurance 20%. no pre-authorization required. Passport, 05/10/2019 @ 9:01am, REF# [id number]  Will contact patient to see if he is interested in the Cardiac Rehab Program. If interested, patient will need to complete follow up appt. Once completed, patient will be contacted for scheduling upon review by the RN Navigator.  2ndary insurance is active and benefits verified through [place]. Co-pay 0, DED 0/0 met, out of pocket 0/0 met, co-insurance 0. No pre-authorization required. Passport, 05/10/2019 @ 9:05am, REF# [id number]

## 2019-05-11 ENCOUNTER — Encounter: Payer: Self-pay | Admitting: Internal Medicine

## 2019-05-11 ENCOUNTER — Other Ambulatory Visit: Payer: Self-pay | Admitting: Internal Medicine

## 2019-05-11 DIAGNOSIS — J441 Chronic obstructive pulmonary disease with (acute) exacerbation: Secondary | ICD-10-CM

## 2019-05-11 MED ORDER — AZITHROMYCIN 500 MG PO TABS: 500.0000 mg | ORAL_TABLET | Freq: Every day | ORAL | 0 refills | Status: AC

## 2019-05-11 MED ORDER — PREDNISONE 10 MG (21) PO TBPK: ORAL_TABLET | ORAL | 0 refills | Status: DC

## 2019-05-16 NOTE — Progress Notes (Signed)
Cardiology Office Note:    Date:  05/17/2019   ID:  Charles Mccoy, DOB [birth year], MRN [id number]  PCP:  [name], [name], MD  Cardiologist:  [name], MD  Referring MD: [name], Estel*   Chief Complaint  Patient presents with  . Follow-up    MI and PCI    History of Present Illness:    Caidon Foti is a 68 y.o. male with a past medical history significant for HTN, HLD, CVA (IRL planted without evidence of arrhythmias on ASA), prior tobacco use (quit 20 years ago) and obesity. Now with CAD S/P NSTEMI and PCI.   The patient was admitted to the hospital on 05/03/2019 with complaints of chest pain and diaphoresis while mowing his lawn.  He had had intermittent symptoms over the prior 2 weeks.  He had been seen by his PCP for his complaints and a CTA of the chest was done that did not reveal dissection or aneurysm of the aorta but did reveal three-vessel coronary artery calcifications.  Troponin peaked at 13,984.  He was treated for NSTEMI.  He underwent LHC on 05/04/2019 that revealed severe stenosis of the LAD which was treated with a drug-eluting stent.  He also had moderate circumflex stenosis.  LV systolic function was normal by echo.  He was started on dual antiplatelet therapy with aspirin and Brilinta for 1 year.  His Lipitor was increased from 40 mg to 80 mg.  Beta-blocker was stopped due to nocturnal bradycardia.  He was continued on Norvasc for blood pressure and resumed on his home dose of Aldactone.  Mr. Dibert is here for hospital follow up. He is full of questions. He had a fever after his hospitalization, 100.4 X 4 days. He says his voice is now raspy. He had steroids and antibiotic per PCP, last day yesterday. He has history of bronchial asthma and feels that may have flared up. He had another COVID test which was negative.   He is up to 15 minutes per day of brisk walking with no chest discomfort or shortness of breath. He occ feels a mild, brief chest  discomfort when laying down but not enough to bother him. No orthopnea, PND, swelling, palpitations.   Right wrist cath site healed, still has some bruising.  He works in sports television, travels Company secretary.    Past Medical History:  Diagnosis Date  . ALLERGIC RHINITIS 06/19/2007  . ASTHMA 08/17/2008  . Back pain 05/2016  . COPD (chronic obstructive pulmonary disease) ([place])   . GERD (gastroesophageal reflux disease)   . HYPERTENSION 06/19/2007  . OSTEOARTHRITIS 10/09/2009   wrists, knee.  . Pneumonia    hx of   . SINUSITIS 09/15/2007   recent issuses 2 weeks ago-clearing.  . Stroke [facility])     Past Surgical History:  Procedure Laterality Date  . CORONARY STENT INTERVENTION N/A 05/04/2019   Procedure: CORONARY STENT INTERVENTION;  Surgeon: Leonie Man, MD;  Location: [place] [facility];  Service: Cardiovascular;  Laterality: N/A;  . cyst removed from neck      age 89   . KNEE ARTHROSCOPY     2 on left knee , 1 on right  . LEFT HEART CATH AND CORONARY ANGIOGRAPHY N/A 05/04/2019   Procedure: LEFT HEART CATH AND CORONARY ANGIOGRAPHY;  Surgeon: Leonie Man, MD;  Location: [place] [facility];  Service: Cardiovascular;  Laterality: N/A;  . LOOP RECORDER INSERTION N/A 01/28/2018   Procedure: LOOP RECORDER INSERTION;  Surgeon: Cristopher Peru  W, MD;  Location: [place] [facility];  Service: Cardiovascular;  Laterality: N/A;  . TEE WITHOUT CARDIOVERSION N/A 01/28/2018   Procedure: TRANSESOPHAGEAL ECHOCARDIOGRAM (TEE);  Surgeon: Acie Fredrickson Wonda Cheng, MD;  Location: [facility] ENDOSCOPY;  Service: Cardiovascular;  Laterality: N/A;  . TOTAL KNEE ARTHROPLASTY Left 07/21/2014   Procedure: LEFT TOTAL KNEE ARTHROPLASTY;  Surgeon: Johnn Hai, MD;  Location: [facility];  Service: Orthopedics;  Laterality: Left;  . TOTAL KNEE ARTHROPLASTY Right 05/30/2016   Procedure: RIGHT TOTAL KNEE ARTHROPLASTY REPLACEMENT;  Surgeon: Susa Day, MD;  Location: [facility];  Service: Orthopedics;  Laterality: Right;    Current  Medications: Current Meds  Medication Sig  . albuterol (VENTOLIN HFA) 108 (90 Base) MCG/ACT inhaler Use as directed  . amLODipine (NORVASC) 10 MG tablet TAKE 1 TABLET BY MOUTH EVERY DAY IN THE MORNING  . aspirin 81 MG EC tablet Take 1 tablet (81 mg total) by mouth daily.  Marland Kitchen atorvastatin (LIPITOR) 80 MG tablet Take 1 tablet (80 mg total) by mouth daily at 6 PM.  . azelastine (ASTELIN) 0.1 % nasal spray Place 1 spray into both nostrils as needed for allergies.   Marland Kitchen azithromycin (ZITHROMAX) 500 MG tablet Take 1 tablet (500 mg total) by mouth daily for 7 days.  . fexofenadine (ALLEGRA) 60 MG tablet Take 60 mg by mouth as needed for allergies.   . Fluticasone-Salmeterol 232-14 MCG/ACT AEPB Inhale 1 puff into the lungs as needed (shortness of breath).   . montelukast (SINGULAIR) 10 MG tablet Take 10 mg by mouth every morning.  . nitroGLYCERIN (NITROSTAT) 0.4 MG SL tablet Place 1 tablet (0.4 mg total) under the tongue every 5 (five) minutes as needed for chest pain.  . pantoprazole (PROTONIX) 40 MG tablet Take 1 tablet (40 mg total) by mouth daily.  . predniSONE (STERAPRED UNI-PAK 21 TAB) 10 MG (21) TBPK tablet Take as directed  . spironolactone (ALDACTONE) 25 MG tablet Take 1 tablet (25 mg total) by mouth daily.  . ticagrelor (BRILINTA) 90 MG TABS tablet Take 1 tablet (90 mg total) by mouth 2 (two) times daily.  . [DISCONTINUED] aspirin 81 MG EC tablet Take 1 tablet (81 mg total) by mouth daily.     Allergies:   Erythromycin base   Social History   Socioeconomic History  . Marital status: Married    Spouse name: Not on file  . Number of children: Not on file  . Years of education: Not on file  . Highest education level: Not on file  Occupational History  . Not on file  Social Needs  . Financial resource strain: Not on file  . Food insecurity    Worry: Not on file    Inability: Not on file  . Transportation needs    Medical: Not on file    Non-medical: Not on file  Tobacco Use  .  Smoking status: Former Smoker    Types: Cigarettes    Quit date: 10/07/2001    Years since quitting: 17.6  . Smokeless tobacco: Never Used  Substance and Sexual Activity  . Alcohol use: Yes    Alcohol/week: 6.0 standard drinks    Types: 6 Cans of beer per week    Comment: beer daily  . Drug use: No  . Sexual activity: Yes  Lifestyle  . Physical activity    Days per week: Not on file    Minutes per session: Not on file  . Stress: Not on file  Relationships  . Social connections  Talks on phone: Not on file    Gets together: Not on file    Attends religious service: Not on file    Active member of club or organization: Not on file    Attends meetings of clubs or organizations: Not on file    Relationship status: Not on file  Other Topics Concern  . Not on file  Social History Narrative  . Not on file     Family History: The patient's family history is negative for Colon cancer, Esophageal cancer, Rectal cancer, and Stomach cancer. ROS:   Please see the history of present illness.     All other systems reviewed and are negative.  EKGs/Labs/Other Studies Reviewed:    The following studies were reviewed today:  Cardiac cath 05/04/19:  CULPRIT LESION: Mid LAD lesion is 99% stenosed.-Subtotal occluded with right to-left collaterals  A drug-eluting stent was successfully placed using a STENT SYNERGY DES 2.75X20. -Postdilated to 3.0 mm  Post intervention, there is a 0% residual stenosis.  -------------------------------------------------------  Prox Cx to Mid Cx lesion is 55% stenosed.  Dist RCA lesion is 30% stenosed.  The left ventricular systolic function is normal. The left ventricular ejection fraction is 55-65% by visual estimate. LV end diastolic pressure is normal.  SUMMARY  Severe single-vessel disease with LAD 99% subtotal occlusion (significant right - left collaterals) ? Successful DES PCI of LAD using Synergy DES 2.75 mm at 20 mm (3.0 mm)  Moderate  circumflex disease but otherwise minimal disease.  Preserved LVEF with apical hypokinesis, and normal EDP.  Echo 05/04/19: 1. The left ventricle has hyperdynamic systolic function, with an ejection fraction of >65%. The cavity size was normal. There is mildly increased left ventricular wall thickness. Left ventricular diastolic Doppler parameters are consistent with  impaired relaxation. No evidence of left ventricular regional wall motion abnormalities. 2. The right ventricle has normal systolic function. The cavity was normal. There is no increase in right ventricular wall thickness. 3. The mitral valve is grossly normal. 4. The tricuspid valve is grossly normal. 5. The aortic valve was not well visualized. No stenosis of the aortic valve. 6. The aorta is normal in size and structure. 7. The inferior vena cava was dilated in size with <50% respiratory variability. _____________   EKG:  EKG is not ordered today.  Recent Labs: 08/17/2018: ALT 19 05/05/2019: BUN 9; Creatinine, Ser 0.87; Hemoglobin 13.7; Platelets 240; Potassium 3.7; Sodium 134   Recent Lipid Panel    Component Value Date/Time   CHOL 120 05/04/2019 0119   CHOL 115 06/09/2018 1114   TRIG 94 05/04/2019 0119   HDL 32 (L) 05/04/2019 0119   HDL 38 (L) 06/09/2018 1114   CHOLHDL 3.8 05/04/2019 0119   VLDL 19 05/04/2019 0119   LDLCALC 69 05/04/2019 0119   LDLCALC 63 06/09/2018 1114   LDLDIRECT 145.8 08/17/2012 1027    Physical Exam:    VS:  BP 122/80   Pulse 84   Ht 6\' 3"  (1.905 m)   Wt 236 lb 9.6 oz (107.3 kg)   SpO2 97%   BMI 29.57 kg/m     Wt Readings from Last 3 Encounters:  05/17/19 236 lb 9.6 oz (107.3 kg)  05/05/19 237 lb 14 oz (107.9 kg)  04/29/19 243 lb 8 oz (110.5 kg)     Physical Exam  Constitutional: He is oriented to person, place, and time. He appears well-developed and well-nourished. No distress.  HENT:  Head: Normocephalic and atraumatic.  Neck: Normal range of  motion. Neck  supple. No JVD present.  Cardiovascular: Normal rate, regular rhythm, normal heart sounds and intact distal pulses. Exam reveals no gallop and no friction rub.  No murmur heard. Pulmonary/Chest: Effort normal and breath sounds normal. No respiratory distress. He has no wheezes. He has no rales.  Abdominal: Soft. Bowel sounds are normal.  Musculoskeletal: Normal range of motion.        General: No edema.  Neurological: He is alert and oriented to person, place, and time.  Skin: Skin is warm and dry.  Psychiatric: He has a normal mood and affect. His behavior is normal. Judgment and thought content normal.  Vitals reviewed.    ASSESSMENT:    1. Status post non-ST elevation myocardial infarction (NSTEMI)   2. Hyperlipidemia, unspecified hyperlipidemia type   3. Essential (primary) hypertension    PLAN:    In order of problems listed above:  CAD s/p  NSTEMI -Patient underwent successful drug-eluting stent to the LAD, has moderate circumflex disease 55% and RCA stenosis of 30%.  He has preserved LVEF with apical hypokinesis and normal EDP. -On dual antiplatelet therapy with aspirin 81 mg and Brilinta 90 mg twice daily for at least 1 year. -Beta-blocker was discontinued due to nocturnal bradycardia in the hospital.  He continues on statin, increased dose. -Continue secondary prevention. -Pt plans to attend cardiac rehab. He is going to stay out of work to attend because he feels it is very important. He says that his work is very physical. Advised that he can return to work 4 weeks after his MI.  -He has improved his diet. Making his own food and using organic, increased fruits and veg and lower in red meat and processed foods. Info on mediterranean diet provided.   Hypertension -On amlodipine and Spironolactone.  Beta-blocker discontinued. -BP well controlled. Continue current therapy.   Hyperlipidemia -Lipid panel on 05/04/2019 showed LDL 69, HDL 32, TG 94. -High intensity statin was  continued with atorvastatin increased from 40 mg to 80 mg daily. Will recheck lipids and LFT in 8 weeks.   Over weight -Body mass index is 29.57 kg/m. -Pt has greatly improved his diet and is working on increasing activity. -Dietary and lifestyle modifications to promote weight loss recommended.  Better food choices and portion control.  Medication Adjustments/Labs and Tests Ordered: Current medicines are reviewed at length with the patient today.  Concerns regarding medicines are outlined above. Labs and tests ordered and medication changes are outlined in the patient instructions below:  Patient Instructions  Medication Instructions:  Your physician recommends that you continue on your current medications as directed. Please refer to the Current Medication list given to you today.  If you need a refill on your cardiac medications before your next appointment, please call your pharmacy.   Lab work: Your physician recommends that you return for a FASTING lipid profile and hepatic function test on 06/28/2019 (our lab is open from 7:30 AM to 4:30 PM)  If you have labs (blood work) drawn today and your tests are completely normal, you will receive your results only by: Marland Kitchen MyChart Message (if you have MyChart) OR . A paper copy in the mail If you have any lab test that is abnormal or we need to change your treatment, we will call you to review the results.  Testing/Procedures: None   Follow-Up: At [facility], you and your health needs are our priority.  As part of our continuing mission to provide you with exceptional heart care,  we have created designated Provider Care Teams.  These Care Teams include your primary Cardiologist (physician) and Advanced Practice Providers (APPs -  Physician Assistants and Nurse Practitioners) who all work together to provide you with the care you need, when you need it. You will need a follow up appointment in:  6 months.  Please call our office 2 months in  advance to schedule this appointment.  You may see [name], MD or one of the following Advanced Practice Providers on your designated Care Team: Richardson Dopp, PA-C [place], [place] . Daune Perch, NP  Any Other Special Instructions Will Be Listed Below (If Applicable).   Lifestyle Modifications to Prevent and Treat Heart Disease -Recommend heart healthy/Mediterranean diet, with whole grains, fruits, vegetable, fish, lean meats, nuts, and olive oil.  -Limit salt. -Recommend moderate walking, 3-5 times/week for 30-50 minutes each session. Aim for at least 150 minutes.week. Goal should be pace of 3 miles/hours, or walking 1.5 miles in 30 minutes -Recommend avoidance of tobacco products. Avoid excess alcohol. -Keep blood pressure well controlled, ideally less than 130/80.      Mediterranean Diet A Mediterranean diet refers to food and lifestyle choices that are based on the traditions of countries located on the [employer]. This way of eating has been shown to help prevent certain conditions and improve outcomes for people who have chronic diseases, like kidney disease and heart disease. What are tips for following this plan? Lifestyle  Cook and eat meals together with your family, when possible.  Drink enough fluid to keep your urine clear or pale yellow.  Be physically active every day. This includes: ? Aerobic exercise like running or swimming. ? Leisure activities like gardening, walking, or housework.  Get 7-8 hours of sleep each night.  If recommended by your health care provider, drink red wine in moderation. This means 1 glass a day for nonpregnant women and 2 glasses a day for men. A glass of wine equals 5 oz (150 mL). Reading food labels   Check the serving size of packaged foods. For foods such as rice and pasta, the serving size refers to the amount of cooked product, not dry.  Check the total fat in packaged foods. Avoid foods that have saturated fat or  trans fats.  Check the ingredients list for added sugars, such as corn syrup. Shopping  At the grocery store, buy most of your food from the areas near the walls of the store. This includes: ? Fresh fruits and vegetables (produce). ? Grains, beans, nuts, and seeds. Some of these may be available in unpackaged forms or large amounts (in bulk). ? Fresh seafood. ? Poultry and eggs. ? Low-fat dairy products.  Buy whole ingredients instead of prepackaged foods.  Buy fresh fruits and vegetables in-season from local farmers markets.  Buy frozen fruits and vegetables in resealable bags.  If you do not have access to quality fresh seafood, buy precooked frozen shrimp or canned fish, such as tuna, salmon, or sardines.  Buy small amounts of raw or cooked vegetables, salads, or olives from the deli or salad bar at your store.  Stock your pantry so you always have certain foods on hand, such as olive oil, canned tuna, canned tomatoes, rice, pasta, and beans. Cooking  Cook foods with extra-virgin olive oil instead of using butter or other vegetable oils.  Have meat as a side dish, and have vegetables or grains as your main dish. This means having meat in small portions or adding  small amounts of meat to foods like pasta or stew.  Use beans or vegetables instead of meat in common dishes like chili or lasagna.  Experiment with different cooking methods. Try roasting or broiling vegetables instead of steaming or sauteing them.  Add frozen vegetables to soups, stews, pasta, or rice.  Add nuts or seeds for added healthy fat at each meal. You can add these to yogurt, salads, or vegetable dishes.  Marinate fish or vegetables using olive oil, lemon juice, garlic, and fresh herbs. Meal planning   Plan to eat 1 vegetarian meal one day each week. Try to work up to 2 vegetarian meals, if possible.  Eat seafood 2 or more times a week.  Have healthy snacks readily available, such as: ? Vegetable  sticks with hummus. ? [place] yogurt. ? Fruit and nut trail mix.  Eat balanced meals throughout the week. This includes: ? Fruit: 2-3 servings a day ? Vegetables: 4-5 servings a day ? Low-fat dairy: 2 servings a day ? Fish, poultry, or lean meat: 1 serving a day ? Beans and legumes: 2 or more servings a week ? Nuts and seeds: 1-2 servings a day ? Whole grains: 6-8 servings a day ? Extra-virgin olive oil: 3-4 servings a day  Limit red meat and sweets to only a few servings a month What are my food choices?  Mediterranean diet ? Recommended  Grains: Whole-grain pasta. Brown rice. Bulgar wheat. Polenta. Couscous. Whole-wheat bread. [place].  Vegetables: Artichokes. Beets. Broccoli. Cabbage. Carrots. Eggplant. Green beans. Chard. Kale. Spinach. Onions. Leeks. Peas. Squash. Tomatoes. Peppers. Radishes.  Fruits: Apples. Apricots. Avocado. Berries. Bananas. Cherries. Dates. Figs. Grapes. Lemons. Melon. Oranges. Peaches. Plums. Pomegranate.  Meats and other protein foods: Beans. Almonds. Sunflower seeds. Pine nuts. Peanuts. [place]. Salmon. Scallops. Shrimp. [place]. Tilapia. Clams. Oysters. Eggs.  Dairy: Low-fat milk. Cheese. Greek yogurt.  Beverages: Water. Red wine. Herbal tea.  Fats and oils: Extra virgin olive oil. Avocado oil. Grape seed oil.  Sweets and desserts: [place] yogurt with honey. Baked apples. Poached pears. Trail mix.  Seasoning and other foods: Basil. Cilantro. Coriander. Cumin. Mint. Parsley. Sage. Rosemary. Tarragon. Garlic. Oregano. Thyme. Pepper. Balsalmic vinegar. Tahini. Hummus. Tomato sauce. Olives. Mushrooms. ? Limit these  Grains: Prepackaged pasta or rice dishes. Prepackaged cereal with added sugar.  Vegetables: Deep fried potatoes (french fries).  Fruits: Fruit canned in syrup.  Meats and other protein foods: Beef. Pork. Lamb. Poultry with skin. Hot dogs. Berniece Salines.  Dairy: Ice cream. Sour cream. Whole milk.  Beverages: Juice. Sugar-sweetened soft  drinks. Beer. Liquor and spirits.  Fats and oils: Butter. Canola oil. Vegetable oil. Beef fat (tallow). Lard.  Sweets and desserts: Cookies. Cakes. Pies. Candy.  Seasoning and other foods: Mayonnaise. Premade sauces and marinades. The items listed may not be a complete list. Talk with your dietitian about what dietary choices are right for you. Summary  The Mediterranean diet includes both food and lifestyle choices.  Eat a variety of fresh fruits and vegetables, beans, nuts, seeds, and whole grains.  Limit the amount of red meat and sweets that you eat.  Talk with your health care provider about whether it is safe for you to drink red wine in moderation. This means 1 glass a day for nonpregnant women and 2 glasses a day for men. A glass of wine equals 5 oz (150 mL). This information is not intended to replace advice given to you by your health care provider. Make sure you discuss any questions you have with  your health care provider. Document Released: 05/16/2016 Document Revised: 05/23/2016 Document Reviewed: 05/16/2016 Elsevier Patient Education  2020 [place], Daune Perch, NP  05/17/2019 5:23 PM    [place]

## 2019-05-16 NOTE — Patient Instructions (Addendum)
Medication Instructions:  Your physician recommends that you continue on your current medications as directed. Please refer to the Current Medication list given to you today.  If you need a refill on your cardiac medications before your next appointment, please call your pharmacy.   Lab work: Your physician recommends that you return for a FASTING lipid profile and hepatic function test on 06/28/2019 (our lab is open from 7:30 AM to 4:30 PM)  If you have labs (blood work) drawn today and your tests are completely normal, you will receive your results only by: Marland Kitchen MyChart Message (if you have MyChart) OR . A paper copy in the mail If you have any lab test that is abnormal or we need to change your treatment, we will call you to review the results.  Testing/Procedures: None   Follow-Up: At [facility], you and your health needs are our priority.  As part of our continuing mission to provide you with exceptional heart care, we have created designated Provider Care Teams.  These Care Teams include your primary Cardiologist (physician) and Advanced Practice Providers (APPs -  Physician Assistants and Nurse Practitioners) who all work together to provide you with the care you need, when you need it. You will need a follow up appointment in:  6 months.  Please call our office 2 months in advance to schedule this appointment.  You may see [name], MD or one of the following Advanced Practice Providers on your designated Care Team: Richardson Dopp, PA-C [place], [place] . Daune Perch, NP  Any Other Special Instructions Will Be Listed Below (If Applicable).   Lifestyle Modifications to Prevent and Treat Heart Disease -Recommend heart healthy/Mediterranean diet, with whole grains, fruits, vegetable, fish, lean meats, nuts, and olive oil.  -Limit salt. -Recommend moderate walking, 3-5 times/week for 30-50 minutes each session. Aim for at least 150 minutes.week. Goal should be pace of 3  miles/hours, or walking 1.5 miles in 30 minutes -Recommend avoidance of tobacco products. Avoid excess alcohol. -Keep blood pressure well controlled, ideally less than 130/80.      Mediterranean Diet A Mediterranean diet refers to food and lifestyle choices that are based on the traditions of countries located on the [employer]. This way of eating has been shown to help prevent certain conditions and improve outcomes for people who have chronic diseases, like kidney disease and heart disease. What are tips for following this plan? Lifestyle  Cook and eat meals together with your family, when possible.  Drink enough fluid to keep your urine clear or pale yellow.  Be physically active every day. This includes: ? Aerobic exercise like running or swimming. ? Leisure activities like gardening, walking, or housework.  Get 7-8 hours of sleep each night.  If recommended by your health care provider, drink red wine in moderation. This means 1 glass a day for nonpregnant women and 2 glasses a day for men. A glass of wine equals 5 oz (150 mL). Reading food labels   Check the serving size of packaged foods. For foods such as rice and pasta, the serving size refers to the amount of cooked product, not dry.  Check the total fat in packaged foods. Avoid foods that have saturated fat or trans fats.  Check the ingredients list for added sugars, such as corn syrup. Shopping  At the grocery store, buy most of your food from the areas near the walls of the store. This includes: ? Fresh fruits and vegetables (produce). ? Grains, beans,  nuts, and seeds. Some of these may be available in unpackaged forms or large amounts (in bulk). ? Fresh seafood. ? Poultry and eggs. ? Low-fat dairy products.  Buy whole ingredients instead of prepackaged foods.  Buy fresh fruits and vegetables in-season from local farmers markets.  Buy frozen fruits and vegetables in resealable bags.  If you do not  have access to quality fresh seafood, buy precooked frozen shrimp or canned fish, such as tuna, salmon, or sardines.  Buy small amounts of raw or cooked vegetables, salads, or olives from the deli or salad bar at your store.  Stock your pantry so you always have certain foods on hand, such as olive oil, canned tuna, canned tomatoes, rice, pasta, and beans. Cooking  Cook foods with extra-virgin olive oil instead of using butter or other vegetable oils.  Have meat as a side dish, and have vegetables or grains as your main dish. This means having meat in small portions or adding small amounts of meat to foods like pasta or stew.  Use beans or vegetables instead of meat in common dishes like chili or lasagna.  Experiment with different cooking methods. Try roasting or broiling vegetables instead of steaming or sauteing them.  Add frozen vegetables to soups, stews, pasta, or rice.  Add nuts or seeds for added healthy fat at each meal. You can add these to yogurt, salads, or vegetable dishes.  Marinate fish or vegetables using olive oil, lemon juice, garlic, and fresh herbs. Meal planning   Plan to eat 1 vegetarian meal one day each week. Try to work up to 2 vegetarian meals, if possible.  Eat seafood 2 or more times a week.  Have healthy snacks readily available, such as: ? Vegetable sticks with hummus. ? [place] yogurt. ? Fruit and nut trail mix.  Eat balanced meals throughout the week. This includes: ? Fruit: 2-3 servings a day ? Vegetables: 4-5 servings a day ? Low-fat dairy: 2 servings a day ? Fish, poultry, or lean meat: 1 serving a day ? Beans and legumes: 2 or more servings a week ? Nuts and seeds: 1-2 servings a day ? Whole grains: 6-8 servings a day ? Extra-virgin olive oil: 3-4 servings a day  Limit red meat and sweets to only a few servings a month What are my food choices?  Mediterranean diet ? Recommended  Grains: Whole-grain pasta. Brown rice. Bulgar wheat.  Polenta. Couscous. Whole-wheat bread. [place].  Vegetables: Artichokes. Beets. Broccoli. Cabbage. Carrots. Eggplant. Green beans. Chard. Kale. Spinach. Onions. Leeks. Peas. Squash. Tomatoes. Peppers. Radishes.  Fruits: Apples. Apricots. Avocado. Berries. Bananas. Cherries. Dates. Figs. Grapes. Lemons. Melon. Oranges. Peaches. Plums. Pomegranate.  Meats and other protein foods: Beans. Almonds. Sunflower seeds. Pine nuts. Peanuts. [place]. Salmon. Scallops. Shrimp. [place]. Tilapia. Clams. Oysters. Eggs.  Dairy: Low-fat milk. Cheese. Greek yogurt.  Beverages: Water. Red wine. Herbal tea.  Fats and oils: Extra virgin olive oil. Avocado oil. Grape seed oil.  Sweets and desserts: [place] yogurt with honey. Baked apples. Poached pears. Trail mix.  Seasoning and other foods: Basil. Cilantro. Coriander. Cumin. Mint. Parsley. Sage. Rosemary. Tarragon. Garlic. Oregano. Thyme. Pepper. Balsalmic vinegar. Tahini. Hummus. Tomato sauce. Olives. Mushrooms. ? Limit these  Grains: Prepackaged pasta or rice dishes. Prepackaged cereal with added sugar.  Vegetables: Deep fried potatoes (french fries).  Fruits: Fruit canned in syrup.  Meats and other protein foods: Beef. Pork. Lamb. Poultry with skin. Hot dogs. Berniece Salines.  Dairy: Ice cream. Sour cream. Whole milk.  Beverages: Juice. Sugar-sweetened  soft drinks. Beer. Liquor and spirits.  Fats and oils: Butter. Canola oil. Vegetable oil. Beef fat (tallow). Lard.  Sweets and desserts: Cookies. Cakes. Pies. Candy.  Seasoning and other foods: Mayonnaise. Premade sauces and marinades. The items listed may not be a complete list. Talk with your dietitian about what dietary choices are right for you. Summary  The Mediterranean diet includes both food and lifestyle choices.  Eat a variety of fresh fruits and vegetables, beans, nuts, seeds, and whole grains.  Limit the amount of red meat and sweets that you eat.  Talk with your health care provider about  whether it is safe for you to drink red wine in moderation. This means 1 glass a day for nonpregnant women and 2 glasses a day for men. A glass of wine equals 5 oz (150 mL). This information is not intended to replace advice given to you by your health care provider. Make sure you discuss any questions you have with your health care provider. Document Released: 05/16/2016 Document Revised: 05/23/2016 Document Reviewed: 05/16/2016 Elsevier Patient Education  2020 [employer].

## 2019-05-17 ENCOUNTER — Ambulatory Visit (INDEPENDENT_AMBULATORY_CARE_PROVIDER_SITE_OTHER): Payer: Medicare Other | Admitting: Cardiology

## 2019-05-17 ENCOUNTER — Encounter: Payer: Self-pay | Admitting: Cardiology

## 2019-05-17 ENCOUNTER — Other Ambulatory Visit: Payer: Self-pay

## 2019-05-17 VITALS — BP 122/80 | HR 84 | Ht 75.0 in | Wt 236.6 lb

## 2019-05-17 DIAGNOSIS — I10 Essential (primary) hypertension: Secondary | ICD-10-CM | POA: Diagnosis not present

## 2019-05-17 DIAGNOSIS — I252 Old myocardial infarction: Secondary | ICD-10-CM

## 2019-05-17 DIAGNOSIS — I63412 Cerebral infarction due to embolism of left middle cerebral artery: Secondary | ICD-10-CM

## 2019-05-17 DIAGNOSIS — E785 Hyperlipidemia, unspecified: Secondary | ICD-10-CM | POA: Diagnosis not present

## 2019-05-17 MED ORDER — ASPIRIN 81 MG PO TBEC: 81.0000 mg | DELAYED_RELEASE_TABLET | Freq: Every day | ORAL | 3 refills | Status: DC

## 2019-05-19 ENCOUNTER — Encounter: Payer: Self-pay | Admitting: Internal Medicine

## 2019-05-20 ENCOUNTER — Other Ambulatory Visit: Payer: Self-pay | Admitting: Internal Medicine

## 2019-05-20 DIAGNOSIS — J441 Chronic obstructive pulmonary disease with (acute) exacerbation: Secondary | ICD-10-CM

## 2019-05-20 MED ORDER — PREDNISONE 10 MG (21) PO TBPK: ORAL_TABLET | ORAL | 0 refills | Status: DC

## 2019-05-21 NOTE — Progress Notes (Signed)
Carelink Summary Report / Loop Recorder 

## 2019-05-25 ENCOUNTER — Telehealth (HOSPITAL_COMMUNITY): Payer: Self-pay | Admitting: Pharmacist

## 2019-05-25 NOTE — Telephone Encounter (Signed)
Cardiac Rehab Medication Review by a Pharmacist  Does the patient  feel that his/her medications are working for him/her?  yes  Has the patient been experiencing any side effects to the medications prescribed?  No, mentioned some increased urination with his prednisone but has now finished his prednisone course.   Does the patient measure his/her own blood pressure or blood glucose at home?  Yes, checks his blood pressure BP - averages around 130/70   Does the patient have any problems obtaining medications due to transportation or finances?   no  Understanding of regimen: good Understanding of indications: excellent Potential of compliance: excellent    Pharmacist comments: Patient is interested in moving his ticagrelor dosing frequency from 1030 and 1030 to 8 and 8. Patient was advised that he can move his dose back one hour for two nights in a row to meet this new requirement.    Thank you,   Eddie Candle, PharmD PGY-1 Pharmacy Resident  05/25/2019 4:00 PM

## 2019-06-01 DIAGNOSIS — J3089 Other allergic rhinitis: Secondary | ICD-10-CM | POA: Diagnosis not present

## 2019-06-01 DIAGNOSIS — K219 Gastro-esophageal reflux disease without esophagitis: Secondary | ICD-10-CM | POA: Diagnosis not present

## 2019-06-01 DIAGNOSIS — J301 Allergic rhinitis due to pollen: Secondary | ICD-10-CM | POA: Diagnosis not present

## 2019-06-01 DIAGNOSIS — J3081 Allergic rhinitis due to animal (cat) (dog) hair and dander: Secondary | ICD-10-CM | POA: Diagnosis not present

## 2019-06-03 ENCOUNTER — Encounter (HOSPITAL_COMMUNITY)
Admission: RE | Admit: 2019-06-03 | Discharge: 2019-06-03 | Disposition: A | Discharge status: Home / Self Care | Payer: Medicare Other | Source: Ambulatory Visit | Attending: Cardiovascular Disease | Admitting: Cardiovascular Disease

## 2019-06-03 ENCOUNTER — Encounter (HOSPITAL_COMMUNITY): Payer: Self-pay

## 2019-06-03 ENCOUNTER — Other Ambulatory Visit: Payer: Self-pay

## 2019-06-03 VITALS — BP 122/62 | HR 61 | Temp 96.1°F | Ht 74.0 in | Wt 233.0 lb

## 2019-06-03 DIAGNOSIS — Z955 Presence of coronary angioplasty implant and graft: Secondary | ICD-10-CM

## 2019-06-03 DIAGNOSIS — I214 Non-ST elevation (NSTEMI) myocardial infarction: Secondary | ICD-10-CM | POA: Insufficient documentation

## 2019-06-03 HISTORY — DX: Hyperlipidemia, unspecified: E78.5

## 2019-06-03 NOTE — Progress Notes (Signed)
         Confirm Consent - In the setting of the current Covid19 crisis, you are scheduled for a in person visit with your Cardiac or Pulmonary team member.  Just as we do with many in-gym visits, in order for you to participate in this visit, we must obtain consent.  If you'd like, I can send this to your mychart (if signed up) or email for you to review.  Otherwise, I can obtain your verbal consent now.  By agreeing to a telephone visit, we'd like you to understand that the technology does not allow for your Cardiac or Pulmonary Rehab team member to perform a physical assessment, and thus may limit their ability to fully assess your ability to perform exercise programs. If your provider identifies any concerns that need to be evaluated in person, we will make arrangements to do so.  Finally, though the technology is pretty good, we cannot assure that it will always work on either your or our end and we cannot ensure that we have a secure connection.  Cardiac and Pulmonary Rehab Telehealth visits and "At Home" cardiac and pulmonary rehab are provided at no cost to you.        Are you willing to proceed?"        STAFF: Did the patient verbally acknowledge consent to telehealth visit? Document YES/NO here: Yes     Barnet Pall RN  Cardiac and Pulmonary Rehab Staff        Date 06/03/19    @ Time 10:28  Pt is interested in participating in Virtual Cardiac Rehab. Pt advised that Virtual Cardiac Rehab is provided at no cost to the patient.  Checklist:  1. Pt has smart device  ie smartphone and/or ipad for downloading an app  Yes 2. Reliable internet/wifi service    Yes 3. Understands how to use their smartphone and navigate within an app.  Yes   Reviewed with pt the scheduling process for virtual cardiac rehab.  Pt verbalized understanding.

## 2019-06-03 NOTE — Progress Notes (Signed)
Cardiac Individual Treatment Plan  Patient Details  Name: Charles Mccoy MRN: [id number] Date of Birth: 1951-06-29 Referring Provider:     [facility] from 06/03/2019 in [place]  Referring Provider  Dr. Cathie Olden      Initial Encounter Date:    CARDIAC REHAB PHASE II ORIENTATION from 06/03/2019 in [place]  Date  06/03/19      Visit Diagnosis: NSTEMI (non-ST elevated myocardial infarction) ([place]) 05/03/19  S/P coronary artery stent placement 05/04/19 S/P DES LAD  Patient's Home Medications on Admission:  Current Outpatient Medications:  .  albuterol (VENTOLIN HFA) 108 (90 Base) MCG/ACT inhaler, Inhale 1 puff into the lungs as needed. Use as directed, Disp: , Rfl:  .  amLODipine (NORVASC) 10 MG tablet, TAKE 1 TABLET BY MOUTH EVERY DAY IN THE MORNING, Disp: 90 tablet, Rfl: 1 .  aspirin 81 MG EC tablet, Take 1 tablet (81 mg total) by mouth daily., Disp: 90 tablet, Rfl: 3 .  atorvastatin (LIPITOR) 80 MG tablet, Take 1 tablet (80 mg total) by mouth daily at 6 PM., Disp: 90 tablet, Rfl: 1 .  azelastine (ASTELIN) 0.1 % nasal spray, Place 1 spray into both nostrils as needed for allergies. , Disp: , Rfl: 5 .  fexofenadine (ALLEGRA) 60 MG tablet, Take 60 mg by mouth as needed for allergies. , Disp: , Rfl:  .  Fluticasone-Salmeterol 232-14 MCG/ACT AEPB, Inhale 1 puff into the lungs 2 (two) times daily., Disp: , Rfl:  .  montelukast (SINGULAIR) 10 MG tablet, Take 10 mg by mouth every morning., Disp: , Rfl:  .  nitroGLYCERIN (NITROSTAT) 0.4 MG SL tablet, Place 1 tablet (0.4 mg total) under the tongue every 5 (five) minutes as needed for chest pain., Disp: 25 tablet, Rfl: 0 .  pantoprazole (PROTONIX) 40 MG tablet, Take 1 tablet (40 mg total) by mouth daily., Disp: 90 tablet, Rfl: 1 .  spironolactone (ALDACTONE) 25 MG tablet, Take 1 tablet (25 mg total) by mouth daily., Disp: 90 tablet, Rfl: 1 .  ticagrelor  (BRILINTA) 90 MG TABS tablet, Take 1 tablet (90 mg total) by mouth 2 (two) times daily., Disp: 180 tablet, Rfl: 3 .  predniSONE (STERAPRED UNI-PAK 21 TAB) 10 MG (21) TBPK tablet, Take as directed (Patient not taking: Reported on 05/27/2019), Disp: 21 tablet, Rfl: 0  Past Medical History: Past Medical History:  Diagnosis Date  . ALLERGIC RHINITIS 06/19/2007  . ASTHMA 08/17/2008  . Back pain 05/2016  . CAD (coronary artery disease)    05/03/19- NSTEMI, 05/04/19 DES to LAD  . COPD (chronic obstructive pulmonary disease) ([place])   . GERD (gastroesophageal reflux disease)   . Hyperlipidemia   . HYPERTENSION 06/19/2007  . OSTEOARTHRITIS 10/09/2009   wrists, knee.  . Pneumonia    hx of   . SINUSITIS 09/15/2007   recent issuses 2 weeks ago-clearing.  . Stroke ([place])     Tobacco Use: Social History   Tobacco Use  Smoking Status Former Smoker  . Types: Cigarettes  . Quit date: 10/07/2001  . Years since quitting: 17.6  Smokeless Tobacco Never Used    Labs: Recent Chemical engineer    Labs for ITP Cardiac and Pulmonary Rehab Latest Ref Rng & Units 10/15/2017 01/18/2018 01/19/2018 06/09/2018 05/04/2019   Cholestrol 0 - 200 mg/dL 180 - 168 115 120   LDLCALC 0 - 99 mg/dL 121(H) - 113(H) 63 69   LDLDIRECT mg/dL - - - - -  HDL >40 mg/dL 40.70 - 39(L) 38(L) 32(L)   Trlycerides <150 mg/dL 89.0 - 79 69 94   Hemoglobin A1c 4.8 - 5.6 % - - 5.4 - 5.5   TCO2 22 - 32 mmol/L - 26 - - -      Capillary Blood Glucose: Lab Results  Component Value Date   GLUCAP 129 (H) 01/19/2018   GLUCAP 92 01/18/2018     Exercise Target Goals: Exercise Program Goal: Individual exercise prescription set using results from initial 6 min walk test and THRR while considering  patient's activity barriers and safety.   Exercise Prescription Goal: Initial exercise prescription builds to 30-45 minutes a day of aerobic activity, 2-3 days per week.  Home exercise guidelines will be given to patient during program as part of  exercise prescription that the participant will acknowledge.  Activity Barriers & Risk Stratification: Activity Barriers & Cardiac Risk Stratification - 06/03/19 1039      Activity Barriers & Cardiac Risk Stratification   Activity Barriers  Deconditioning    Cardiac Risk Stratification  High       6 Minute Walk: 6 Minute Walk    Row Name 06/03/19 1038         6 Minute Walk   Phase  Initial     Distance  1581 feet     Walk Time  6 minutes     # of Rest Breaks  0     MPH  2.9     METS  3.5     RPE  11     Perceived Dyspnea   0     VO2 Peak  12.26     Symptoms  No     Resting HR  61 bpm     Resting BP  122/62     Resting Oxygen Saturation   98 %     Exercise Oxygen Saturation  during 6 min walk  96 %     Max Ex. HR  98 bpm     Max Ex. BP  128/70     2 Minute Post BP  116/62        Oxygen Initial Assessment:   Oxygen Re-Evaluation:   Oxygen Discharge (Final Oxygen Re-Evaluation):   Initial Exercise Prescription: Initial Exercise Prescription - 06/03/19 1000      Date of Initial Exercise RX and Referring Provider   Date  06/03/19    Referring Provider  Dr. Cathie Olden    Expected Discharge Date  07/16/19      Treadmill   MPH  3    Grade  1    Minutes  15      NuStep   Level  3    SPM  85    Minutes  15    METs  3      Prescription Details   Frequency (times per week)  3    Duration  Progress to 30 minutes of continuous aerobic without signs/symptoms of physical distress      Intensity   THRR 40-80% of Max Heartrate  62-123    Ratings of Perceived Exertion  11-13      Progression   Progression  Continue to progress workloads to maintain intensity without signs/symptoms of physical distress.      Resistance Training   Training Prescription  Yes    Weight  5 lbs.     Reps  10-15       Perform Capillary Blood Glucose checks as needed.  Exercise  Prescription Changes:   Exercise Comments:   Exercise Goals and Review: Exercise Goals    Row  Name 06/03/19 1040             Exercise Goals   Increase Physical Activity  Yes       Intervention  Provide advice, education, support and counseling about physical activity/exercise needs.;Develop an individualized exercise prescription for aerobic and resistive training based on initial evaluation findings, risk stratification, comorbidities and participant's personal goals.       Expected Outcomes  Short Term: Attend rehab on a regular basis to increase amount of physical activity.;Long Term: Add in home exercise to make exercise part of routine and to increase amount of physical activity.;Long Term: Exercising regularly at least 3-5 days a week.       Increase Strength and Stamina  Yes       Intervention  Provide advice, education, support and counseling about physical activity/exercise needs.;Develop an individualized exercise prescription for aerobic and resistive training based on initial evaluation findings, risk stratification, comorbidities and participant's personal goals.       Expected Outcomes  Short Term: Increase workloads from initial exercise prescription for resistance, speed, and METs.;Short Term: Perform resistance training exercises routinely during rehab and add in resistance training at home;Long Term: Improve cardiorespiratory fitness, muscular endurance and strength as measured by increased METs and functional capacity (6MWT)       Able to understand and use rate of perceived exertion (RPE) scale  Yes       Intervention  Provide education and explanation on how to use RPE scale       Expected Outcomes  Short Term: Able to use RPE daily in rehab to express subjective intensity level;Long Term:  Able to use RPE to guide intensity level when exercising independently       Knowledge and understanding of Target Heart Rate Range (THRR)  Yes       Intervention  Provide education and explanation of THRR including how the numbers were predicted and where they are located for  reference       Expected Outcomes  Short Term: Able to state/look up THRR;Long Term: Able to use THRR to govern intensity when exercising independently;Short Term: Able to use daily as guideline for intensity in rehab       Able to check pulse independently  Yes       Intervention  Provide education and demonstration on how to check pulse in carotid and radial arteries.;Review the importance of being able to check your own pulse for safety during independent exercise       Expected Outcomes  Short Term: Able to explain why pulse checking is important during independent exercise;Long Term: Able to check pulse independently and accurately       Understanding of Exercise Prescription  Yes       Intervention  Provide education, explanation, and written materials on patient's individual exercise prescription       Expected Outcomes  Short Term: Able to explain program exercise prescription;Long Term: Able to explain home exercise prescription to exercise independently          Exercise Goals Re-Evaluation :   Discharge Exercise Prescription (Final Exercise Prescription Changes):   Nutrition:  Target Goals: Understanding of nutrition guidelines, daily intake of sodium 1500mg , cholesterol 200mg , calories 30% from fat and 7% or less from saturated fats, daily to have 5 or more servings of fruits and vegetables.  Biometrics: Pre Biometrics - 06/03/19 1041  Pre Biometrics   Height  6\' 2"  (1.88 m)    Weight  105.7 kg    Waist Circumference  43.5 inches    Hip Circumference  44.5 inches    Waist to Hip Ratio  0.98 %    BMI (Calculated)  29.91    Triceps Skinfold  30 mm    % Body Fat  32.2 %    Grip Strength  48 kg    Flexibility  15 in    Single Leg Stand  15.43 seconds        Nutrition Therapy Plan and Nutrition Goals:   Nutrition Assessments:   Nutrition Goals Re-Evaluation:   Nutrition Goals Re-Evaluation:   Nutrition Goals Discharge (Final Nutrition Goals  Re-Evaluation):   Psychosocial: Target Goals: Acknowledge presence or absence of significant depression and/or stress, maximize coping skills, provide positive support system. Participant is able to verbalize types and ability to use techniques and skills needed for reducing stress and depression.  Initial Review & Psychosocial Screening: Initial Psych Review & Screening - 06/03/19 1025      Initial Review   Current issues with  None Identified      Family Dynamics   Good Support System?  Yes      Barriers   Psychosocial barriers to participate in program  There are no identifiable barriers or psychosocial needs.      Screening Interventions   Interventions  Encouraged to exercise       Quality of Life Scores: Quality of Life - 06/03/19 1129      Quality of Life   Select  Quality of Life      Quality of Life Scores   Health/Function Pre  22 %    Socioeconomic Pre  32.69 %    Psych/Spiritual Pre  23.79 %    Family Pre  24 %    GLOBAL Pre  25.09 %      Scores of 19 and below usually indicate a poorer quality of life in these areas.  A difference of  2-3 points is a clinically meaningful difference.  A difference of 2-3 points in the total score of the Quality of Life Index has been associated with significant improvement in overall quality of life, self-image, physical symptoms, and general health in studies assessing change in quality of life.  PHQ-9: Recent Review Flowsheet Data    Depression screen [facility] 2/9 06/03/2019 04/29/2019 10/15/2018 10/15/2018 09/13/2016   Decreased Interest 0 0 0 0 0   Down, Depressed, Hopeless 0 0 0 0 0   PHQ - 2 Score 0 0 0 0 0   Altered sleeping - 0 0 - -   Tired, decreased energy - 0 0 - -   Change in appetite - 0 0 - -   Feeling bad or failure about yourself  - 0 0 - -   Trouble concentrating - 0 0 - -   Moving slowly or fidgety/restless - 0 0 - -   Suicidal thoughts - 0 0 - -   PHQ-9 Score - 0 0 - -   Difficult doing work/chores - - Not  difficult at all - -     Interpretation of Total Score  Total Score Depression Severity:  1-4 = Minimal depression, 5-9 = Mild depression, 10-14 = Moderate depression, 15-19 = Moderately severe depression, 20-27 = Severe depression   Psychosocial Evaluation and Intervention:   Psychosocial Re-Evaluation:   Psychosocial Discharge (Final Psychosocial Re-Evaluation):   Vocational Rehabilitation: Provide  vocational rehab assistance to qualifying candidates.   Vocational Rehab Evaluation & Intervention: Vocational Rehab - 06/03/19 1026      Initial Vocational Rehab Evaluation & Intervention   Assessment shows need for Vocational Rehabilitation  No       Education: Education Goals: Education classes will be provided on a weekly basis, covering required topics. Participant will state understanding/return demonstration of topics presented.  Learning Barriers/Preferences: Learning Barriers/Preferences - 06/03/19 1042      Learning Barriers/Preferences   Learning Barriers  None    Learning Preferences  Video;Skilled Demonstration;Individual Instruction;Pictoral       Education Topics: Count Your Pulse:  -Group instruction provided by verbal instruction, demonstration, patient participation and written materials to support subject.  Instructors address importance of being able to find your pulse and how to count your pulse when at home without a heart monitor.  Patients get hands on experience counting their pulse with staff help and individually.   Heart Attack, Angina, and Risk Factor Modification:  -Group instruction provided by verbal instruction, video, and written materials to support subject.  Instructors address signs and symptoms of angina and heart attacks.    Also discuss risk factors for heart disease and how to make changes to improve heart health risk factors.   Functional Fitness:  -Group instruction provided by verbal instruction, demonstration, patient  participation, and written materials to support subject.  Instructors address safety measures for doing things around the house.  Discuss how to get up and down off the floor, how to pick things up properly, how to safely get out of a chair without assistance, and balance training.   Meditation and Mindfulness:  -Group instruction provided by verbal instruction, patient participation, and written materials to support subject.  Instructor addresses importance of mindfulness and meditation practice to help reduce stress and improve awareness.  Instructor also leads participants through a meditation exercise.    Stretching for Flexibility and Mobility:  -Group instruction provided by verbal instruction, patient participation, and written materials to support subject.  Instructors lead participants through series of stretches that are designed to increase flexibility thus improving mobility.  These stretches are additional exercise for major muscle groups that are typically performed during regular warm up and cool down.   Hands Only CPR:  -Group verbal, video, and participation provides a basic overview of AHA guidelines for community CPR. Role-play of emergencies allow participants the opportunity to practice calling for help and chest compression technique with discussion of AED use.   Hypertension: -Group verbal and written instruction that provides a basic overview of hypertension including the most recent diagnostic guidelines, risk factor reduction with self-care instructions and medication management.    Nutrition I class: Heart Healthy Eating:  -Group instruction provided by PowerPoint slides, verbal discussion, and written materials to support subject matter. The instructor gives an explanation and review of the Therapeutic Lifestyle Changes diet recommendations, which includes a discussion on lipid goals, dietary fat, sodium, fiber, plant stanol/sterol esters, sugar, and the components of  a well-balanced, healthy diet.   Nutrition II class: Lifestyle Skills:  -Group instruction provided by PowerPoint slides, verbal discussion, and written materials to support subject matter. The instructor gives an explanation and review of label reading, grocery shopping for heart health, heart healthy recipe modifications, and ways to make healthier choices when eating out.   Diabetes Question & Answer:  -Group instruction provided by PowerPoint slides, verbal discussion, and written materials to support subject matter. The instructor gives an explanation and  review of diabetes co-morbidities, pre- and post-prandial blood glucose goals, pre-exercise blood glucose goals, signs, symptoms, and treatment of hypoglycemia and hyperglycemia, and foot care basics.   Diabetes Blitz:  -Group instruction provided by PowerPoint slides, verbal discussion, and written materials to support subject matter. The instructor gives an explanation and review of the physiology behind type 1 and type 2 diabetes, diabetes medications and rational behind using different medications, pre- and post-prandial blood glucose recommendations and Hemoglobin A1c goals, diabetes diet, and exercise including blood glucose guidelines for exercising safely.    Portion Distortion:  -Group instruction provided by PowerPoint slides, verbal discussion, written materials, and food models to support subject matter. The instructor gives an explanation of serving size versus portion size, changes in portions sizes over the last 20 years, and what consists of a serving from each food group.   Stress Management:  -Group instruction provided by verbal instruction, video, and written materials to support subject matter.  Instructors review role of stress in heart disease and how to cope with stress positively.     Exercising on Your Own:  -Group instruction provided by verbal instruction, power point, and written materials to support  subject.  Instructors discuss benefits of exercise, components of exercise, frequency and intensity of exercise, and end points for exercise.  Also discuss use of nitroglycerin and activating EMS.  Review options of places to exercise outside of rehab.  Review guidelines for sex with heart disease.   Cardiac Drugs I:  -Group instruction provided by verbal instruction and written materials to support subject.  Instructor reviews cardiac drug classes: antiplatelets, anticoagulants, beta blockers, and statins.  Instructor discusses reasons, side effects, and lifestyle considerations for each drug class.   Cardiac Drugs II:  -Group instruction provided by verbal instruction and written materials to support subject.  Instructor reviews cardiac drug classes: angiotensin converting enzyme inhibitors (ACE-I), angiotensin II receptor blockers (ARBs), nitrates, and calcium channel blockers.  Instructor discusses reasons, side effects, and lifestyle considerations for each drug class.   Anatomy and Physiology of the Circulatory System:  Group verbal and written instruction and models provide basic cardiac anatomy and physiology, with the coronary electrical and arterial systems. Review of: AMI, Angina, Valve disease, Heart Failure, Peripheral Artery Disease, Cardiac Arrhythmia, Pacemakers, and the ICD.   Other Education:  -Group or individual verbal, written, or video instructions that support the educational goals of the cardiac rehab program.   Holiday Eating Survival Tips:  -Group instruction provided by PowerPoint slides, verbal discussion, and written materials to support subject matter. The instructor gives patients tips, tricks, and techniques to help them not only survive but enjoy the holidays despite the onslaught of food that accompanies the holidays.   Knowledge Questionnaire Score: Knowledge Questionnaire Score - 06/03/19 1130      Knowledge Questionnaire Score   Pre Score  23/24        Core Components/Risk Factors/Patient Goals at Admission: Personal Goals and Risk Factors at Admission - 06/03/19 1147      Core Components/Risk Factors/Patient Goals on Admission    Weight Management  Yes;Weight Maintenance;Weight Loss    Intervention  Obesity: Provide education and appropriate resources to help participant work on and attain dietary goals.;Weight Management/Obesity: Establish reasonable short term and long term weight goals.;Weight Management: Provide education and appropriate resources to help participant work on and attain dietary goals.    Hypertension  Yes    Intervention  Provide education on lifestyle modifcations including regular physical activity/exercise, weight management, moderate  sodium restriction and increased consumption of fresh fruit, vegetables, and low fat dairy, alcohol moderation, and smoking cessation.;Monitor prescription use compliance.    Expected Outcomes  Short Term: Continued assessment and intervention until BP is < 140/39mm HG in hypertensive participants. < 130/44mm HG in hypertensive participants with diabetes, heart failure or chronic kidney disease.;Long Term: Maintenance of blood pressure at goal levels.    Lipids  Yes    Intervention  Provide education and support for participant on nutrition & aerobic/resistive exercise along with prescribed medications to achieve LDL 70mg , HDL >40mg .    Expected Outcomes  Short Term: Participant states understanding of desired cholesterol values and is compliant with medications prescribed. Participant is following exercise prescription and nutrition guidelines.;Long Term: Cholesterol controlled with medications as prescribed, with individualized exercise RX and with personalized nutrition plan. Value goals: LDL < 70mg , HDL > 40 mg.       Core Components/Risk Factors/Patient Goals Review:    Core Components/Risk Factors/Patient Goals at Discharge (Final Review):    ITP Comments: ITP Comments     Row Name 06/03/19 1025           ITP Comments  Dr Fransico Him MD, Medical Director          Comments:Patient attended orientation on 06/03/2019 to review rules and guidelines for program.  Completed 6 minute walk test, Intitial ITP, and exercise prescription.  VSS. Telemetry-Sinus Rhythm.  Asymptomatic. Safety measures and social distancing in place per CDC guidelines.Barnet Pall, RN,BSN 06/03/2019 11:51 AM

## 2019-06-04 ENCOUNTER — Ambulatory Visit (INDEPENDENT_AMBULATORY_CARE_PROVIDER_SITE_OTHER): Payer: Medicare Other | Admitting: *Deleted

## 2019-06-04 DIAGNOSIS — I639 Cerebral infarction, unspecified: Secondary | ICD-10-CM

## 2019-06-04 DIAGNOSIS — I6389 Other cerebral infarction: Secondary | ICD-10-CM | POA: Diagnosis not present

## 2019-06-05 LAB — CUP PACEART REMOTE DEVICE CHECK
Date Time Interrogation Session: 20200828213629
Implantable Pulse Generator Implant Date: 20190424

## 2019-06-07 ENCOUNTER — Other Ambulatory Visit: Payer: Self-pay

## 2019-06-07 ENCOUNTER — Encounter (HOSPITAL_COMMUNITY)
Admission: RE | Admit: 2019-06-07 | Discharge: 2019-06-07 | Disposition: A | Discharge status: Home / Self Care | Payer: Medicare Other | Source: Ambulatory Visit | Attending: Cardiovascular Disease | Admitting: Cardiovascular Disease

## 2019-06-07 DIAGNOSIS — Z955 Presence of coronary angioplasty implant and graft: Secondary | ICD-10-CM | POA: Diagnosis not present

## 2019-06-07 DIAGNOSIS — I214 Non-ST elevation (NSTEMI) myocardial infarction: Secondary | ICD-10-CM | POA: Diagnosis not present

## 2019-06-07 NOTE — Progress Notes (Signed)
Daily Session Note  Patient Details  Name: Charles Mccoy MRN: [id number] Date of Birth: 05/08/1951 Referring Provider:     CARDIAC REHAB PHASE II ORIENTATION from 06/03/2019 in [place]  Referring Provider  Dr. Cathie Olden      Encounter Date: 06/07/2019  Check In: Session Check In - 06/07/19 1400      Check-In   Supervising physician immediately available to respond to emergencies  [facility] immediately available    Physician(s)  Dr. Marthenia Rolling    Location  [facility]    Staff Present  Deitra Mayo, BS, ACSM CEP, Exercise Physiologist;Maria Whitaker, RN, Toma Deiters, RN, Luisa Hart, RN, Deland Pretty, MS, ACSM CEP, Exercise Physiologist    Virtual Visit  No    Medication changes reported      No    Fall or balance concerns reported     No    Tobacco Cessation  No Change    Warm-up and Cool-down  Performed on first and last piece of equipment    Resistance Training Performed  Yes    VAD Patient?  No    PAD/SET Patient?  No      Pain Assessment   Currently in Pain?  No/denies    Multiple Pain Sites  No       Capillary Blood Glucose: No results found for this or any previous visit (from the past 24 hour(s)).  Exercise Prescription Changes - 06/07/19 1500      Response to Exercise   Blood Pressure (Admit)  104/60    Blood Pressure (Exercise)  124/78    Blood Pressure (Exit)  124/62    Heart Rate (Admit)  69 bpm    Heart Rate (Exercise)  102 bpm    Heart Rate (Exit)  78 bpm    Rating of Perceived Exertion (Exercise)  12    Symptoms  none    Comments  Pt first day of exercise.     Duration  Continue with 30 min of aerobic exercise without signs/symptoms of physical distress.    Intensity  THRR unchanged      Progression   Progression  Continue to progress workloads to maintain intensity without signs/symptoms of physical distress.    Average METs  2.8      Resistance Training   Training Prescription   Yes    Weight  5 lbs.     Reps  10-15    Time  10 Minutes      Interval Training   Interval Training  No      Treadmill   MPH  2.5    Grade  1    Minutes  15      NuStep   Level  4    SPM  85    Minutes  15    METs  2.4       Social History   Tobacco Use  Smoking Status Former Smoker  . Types: Cigarettes  . Quit date: 10/07/2001  . Years since quitting: 17.6  Smokeless Tobacco Never Used    Goals Met:  Exercise tolerated well  Goals Unmet:  Not Applicable  Comments: Pt started cardiac rehab today.  Pt tolerated light exercise without difficulty. VSS, telemetry-Sinus Rhythm , asymptomatic.  Medication list reconciled. Pt denies barriers to medicaiton compliance.  PSYCHOSOCIAL ASSESSMENT:  PHQ-0. Pt exhibits positive coping skills, hopeful outlook with supportive family. No psychosocial needs identified at this time, no psychosocial interventions necessary.  Pt enjoys classic cars, sports reading and grandchildren.   Pt oriented to exercise equipment and routine.    Understanding verbalized.Barnet Pall, RN,BSN 06/07/2019 4:34 PM  Dr. Fransico Him is Medical Director for Cardiac Rehab at [facility].

## 2019-06-08 NOTE — Progress Notes (Signed)
Carelink Summary Report / Loop Recorder 

## 2019-06-09 ENCOUNTER — Encounter (HOSPITAL_COMMUNITY)
Admission: RE | Admit: 2019-06-09 | Discharge: 2019-06-09 | Disposition: A | Discharge status: Home / Self Care | Payer: Medicare Other | Source: Ambulatory Visit | Attending: Cardiovascular Disease | Admitting: Cardiovascular Disease

## 2019-06-09 ENCOUNTER — Other Ambulatory Visit: Payer: Self-pay

## 2019-06-09 DIAGNOSIS — I214 Non-ST elevation (NSTEMI) myocardial infarction: Secondary | ICD-10-CM | POA: Insufficient documentation

## 2019-06-09 DIAGNOSIS — Z955 Presence of coronary angioplasty implant and graft: Secondary | ICD-10-CM | POA: Diagnosis not present

## 2019-06-11 ENCOUNTER — Other Ambulatory Visit: Payer: Self-pay

## 2019-06-11 ENCOUNTER — Encounter (HOSPITAL_COMMUNITY)
Admission: RE | Admit: 2019-06-11 | Discharge: 2019-06-11 | Disposition: A | Discharge status: Home / Self Care | Payer: Medicare Other | Source: Ambulatory Visit | Attending: Cardiovascular Disease | Admitting: Cardiovascular Disease

## 2019-06-11 DIAGNOSIS — Z955 Presence of coronary angioplasty implant and graft: Secondary | ICD-10-CM | POA: Diagnosis not present

## 2019-06-11 DIAGNOSIS — I214 Non-ST elevation (NSTEMI) myocardial infarction: Secondary | ICD-10-CM

## 2019-06-15 ENCOUNTER — Telehealth: Payer: Self-pay | Admitting: Cardiology

## 2019-06-15 NOTE — Telephone Encounter (Signed)
Spoke with pt. He will pick up note when he comes in for labs on 06/28/2019.

## 2019-06-15 NOTE — Telephone Encounter (Signed)
New message:    Patient calling because he would like to get note to go back to work at the end of the month. Patient has a appt on 06/28/19 and would like to have those before then or at appt.

## 2019-06-16 ENCOUNTER — Encounter (HOSPITAL_COMMUNITY)
Admission: RE | Admit: 2019-06-16 | Discharge: 2019-06-16 | Disposition: A | Discharge status: Home / Self Care | Payer: Medicare Other | Source: Ambulatory Visit | Attending: Cardiovascular Disease | Admitting: Cardiovascular Disease

## 2019-06-16 ENCOUNTER — Other Ambulatory Visit: Payer: Self-pay

## 2019-06-16 DIAGNOSIS — Z955 Presence of coronary angioplasty implant and graft: Secondary | ICD-10-CM | POA: Diagnosis not present

## 2019-06-16 DIAGNOSIS — I214 Non-ST elevation (NSTEMI) myocardial infarction: Secondary | ICD-10-CM

## 2019-06-17 DIAGNOSIS — Z23 Encounter for immunization: Secondary | ICD-10-CM | POA: Diagnosis not present

## 2019-06-18 ENCOUNTER — Encounter (HOSPITAL_COMMUNITY)
Admission: RE | Admit: 2019-06-18 | Discharge: 2019-06-18 | Disposition: A | Discharge status: Home / Self Care | Payer: Medicare Other | Source: Ambulatory Visit | Attending: Cardiovascular Disease | Admitting: Cardiovascular Disease

## 2019-06-18 ENCOUNTER — Other Ambulatory Visit: Payer: Self-pay

## 2019-06-18 DIAGNOSIS — Z955 Presence of coronary angioplasty implant and graft: Secondary | ICD-10-CM | POA: Diagnosis not present

## 2019-06-18 DIAGNOSIS — I214 Non-ST elevation (NSTEMI) myocardial infarction: Secondary | ICD-10-CM

## 2019-06-21 ENCOUNTER — Encounter (HOSPITAL_COMMUNITY)
Admission: RE | Admit: 2019-06-21 | Discharge: 2019-06-21 | Disposition: A | Discharge status: Home / Self Care | Payer: Medicare Other | Source: Ambulatory Visit | Attending: Cardiovascular Disease | Admitting: Cardiovascular Disease

## 2019-06-21 ENCOUNTER — Other Ambulatory Visit: Payer: Self-pay

## 2019-06-21 DIAGNOSIS — Z955 Presence of coronary angioplasty implant and graft: Secondary | ICD-10-CM | POA: Diagnosis not present

## 2019-06-21 DIAGNOSIS — I214 Non-ST elevation (NSTEMI) myocardial infarction: Secondary | ICD-10-CM | POA: Diagnosis not present

## 2019-06-23 ENCOUNTER — Encounter (HOSPITAL_COMMUNITY)
Admission: RE | Admit: 2019-06-23 | Discharge: 2019-06-23 | Disposition: A | Discharge status: Home / Self Care | Payer: Medicare Other | Source: Ambulatory Visit | Attending: Cardiovascular Disease | Admitting: Cardiovascular Disease

## 2019-06-23 ENCOUNTER — Other Ambulatory Visit: Payer: Self-pay

## 2019-06-23 DIAGNOSIS — Z955 Presence of coronary angioplasty implant and graft: Secondary | ICD-10-CM | POA: Diagnosis not present

## 2019-06-23 DIAGNOSIS — I214 Non-ST elevation (NSTEMI) myocardial infarction: Secondary | ICD-10-CM | POA: Diagnosis not present

## 2019-06-24 NOTE — Progress Notes (Signed)
I have reviewed a Home Exercise Prescription with Charles Mccoy . Charles Mccoy is currently exercising at home.  The patient was advised to walk 5-7 days a week for 30-45 minutes.  Charles Mccoy and I discussed how to progress their exercise prescription.  The patient stated that their goals were to continue exercising at home in addition to CR program.  The patient stated that they understand the exercise prescription.  We reviewed exercise guidelines, target heart rate during exercise, RPE Scale, weather conditions, NTG use, endpoints for exercise, warmup and cool down.  Patient is encouraged to come to me with any questions. I will continue to follow up with the patient to assist them with progression and safety.

## 2019-06-24 NOTE — Progress Notes (Signed)
Cardiac Individual Treatment Plan  Patient Details  Name: Charles Mccoy MRN: [id number] Date of Birth: 07-22-1951 Referring Provider:     Mathews from 06/03/2019 in [place]  Referring Provider  Dr. Cathie Olden      Initial Encounter Date:    CARDIAC REHAB PHASE II ORIENTATION from 06/03/2019 in [place]  Date  06/03/19      Visit Diagnosis: NSTEMI (non-ST elevated myocardial infarction) ([place]) 05/03/19  S/P coronary artery stent placement 05/04/19 S/P DES LAD  Patient's Home Medications on Admission:  Current Outpatient Medications:  .  albuterol (VENTOLIN HFA) 108 (90 Base) MCG/ACT inhaler, Inhale 1 puff into the lungs as needed. Use as directed, Disp: , Rfl:  .  amLODipine (NORVASC) 10 MG tablet, TAKE 1 TABLET BY MOUTH EVERY DAY IN THE MORNING, Disp: 90 tablet, Rfl: 1 .  aspirin 81 MG EC tablet, Take 1 tablet (81 mg total) by mouth daily., Disp: 90 tablet, Rfl: 3 .  atorvastatin (LIPITOR) 80 MG tablet, Take 1 tablet (80 mg total) by mouth daily at 6 PM., Disp: 90 tablet, Rfl: 1 .  azelastine (ASTELIN) 0.1 % nasal spray, Place 1 spray into both nostrils as needed for allergies. , Disp: , Rfl: 5 .  fexofenadine (ALLEGRA) 60 MG tablet, Take 60 mg by mouth as needed for allergies. , Disp: , Rfl:  .  Fluticasone-Salmeterol 232-14 MCG/ACT AEPB, Inhale 1 puff into the lungs 2 (two) times daily., Disp: , Rfl:  .  montelukast (SINGULAIR) 10 MG tablet, Take 10 mg by mouth every morning., Disp: , Rfl:  .  nitroGLYCERIN (NITROSTAT) 0.4 MG SL tablet, Place 1 tablet (0.4 mg total) under the tongue every 5 (five) minutes as needed for chest pain., Disp: 25 tablet, Rfl: 0 .  pantoprazole (PROTONIX) 40 MG tablet, Take 1 tablet (40 mg total) by mouth daily., Disp: 90 tablet, Rfl: 1 .  predniSONE (STERAPRED UNI-PAK 21 TAB) 10 MG (21) TBPK tablet, Take as directed (Patient not taking: Reported on 05/27/2019), Disp: 21  tablet, Rfl: 0 .  spironolactone (ALDACTONE) 25 MG tablet, Take 1 tablet (25 mg total) by mouth daily., Disp: 90 tablet, Rfl: 1 .  ticagrelor (BRILINTA) 90 MG TABS tablet, Take 1 tablet (90 mg total) by mouth 2 (two) times daily., Disp: 180 tablet, Rfl: 3  Past Medical History: Past Medical History:  Diagnosis Date  . ALLERGIC RHINITIS 06/19/2007  . ASTHMA 08/17/2008  . Back pain 05/2016  . CAD (coronary artery disease)    05/03/19- NSTEMI, 05/04/19 DES to LAD  . COPD (chronic obstructive pulmonary disease) ([place])   . GERD (gastroesophageal reflux disease)   . Hyperlipidemia   . HYPERTENSION 06/19/2007  . OSTEOARTHRITIS 10/09/2009   wrists, knee.  . Pneumonia    hx of   . SINUSITIS 09/15/2007   recent issuses 2 weeks ago-clearing.  . Stroke ([place])     Tobacco Use: Social History   Tobacco Use  Smoking Status Former Smoker  . Types: Cigarettes  . Quit date: 10/07/2001  . Years since quitting: 17.7  Smokeless Tobacco Never Used    Labs: Recent Chemical engineer    Labs for ITP Cardiac and Pulmonary Rehab Latest Ref Rng & Units 10/15/2017 01/18/2018 01/19/2018 06/09/2018 05/04/2019   Cholestrol 0 - 200 mg/dL 180 - 168 115 120   LDLCALC 0 - 99 mg/dL 121(H) - 113(H) 63 69   LDLDIRECT mg/dL - - - - -  HDL >40 mg/dL 40.70 - 39(L) 38(L) 32(L)   Trlycerides <150 mg/dL 89.0 - 79 69 94   Hemoglobin A1c 4.8 - 5.6 % - - 5.4 - 5.5   TCO2 22 - 32 mmol/L - 26 - - -      Capillary Blood Glucose: Lab Results  Component Value Date   GLUCAP 129 (H) 01/19/2018   GLUCAP 92 01/18/2018     Exercise Target Goals: Exercise Program Goal: Individual exercise prescription set using results from initial 6 min walk test and THRR while considering  patient's activity barriers and safety.   Exercise Prescription Goal: Starting with aerobic activity 30 plus minutes a day, 3 days per week for initial exercise prescription. Provide home exercise prescription and guidelines that participant acknowledges  understanding prior to discharge.  Activity Barriers & Risk Stratification: Activity Barriers & Cardiac Risk Stratification - 06/03/19 1039      Activity Barriers & Cardiac Risk Stratification   Activity Barriers  Deconditioning    Cardiac Risk Stratification  High       6 Minute Walk: 6 Minute Walk    Row Name 06/03/19 1038         6 Minute Walk   Phase  Initial     Distance  1581 feet     Walk Time  6 minutes     # of Rest Breaks  0     MPH  2.9     METS  3.5     RPE  11     Perceived Dyspnea   0     VO2 Peak  12.26     Symptoms  No     Resting HR  61 bpm     Resting BP  122/62     Resting Oxygen Saturation   98 %     Exercise Oxygen Saturation  during 6 min walk  96 %     Max Ex. HR  98 bpm     Max Ex. BP  128/70     2 Minute Post BP  116/62        Oxygen Initial Assessment:   Oxygen Re-Evaluation:   Oxygen Discharge (Final Oxygen Re-Evaluation):   Initial Exercise Prescription: Initial Exercise Prescription - 06/03/19 1000      Date of Initial Exercise RX and Referring Provider   Date  06/03/19    Referring Provider  Dr. Cathie Olden    Expected Discharge Date  07/16/19      Treadmill   MPH  3    Grade  1    Minutes  15      NuStep   Level  3    SPM  85    Minutes  15    METs  3      Prescription Details   Frequency (times per week)  3    Duration  Progress to 30 minutes of continuous aerobic without signs/symptoms of physical distress      Intensity   THRR 40-80% of Max Heartrate  62-123    Ratings of Perceived Exertion  11-13      Progression   Progression  Continue to progress workloads to maintain intensity without signs/symptoms of physical distress.      Resistance Training   Training Prescription  Yes    Weight  5 lbs.     Reps  10-15       Perform Capillary Blood Glucose checks as needed.  Exercise Prescription Changes: Exercise Prescription Changes  [place] Name 06/07/19 1500 06/23/19 1330           Response to Exercise    Blood Pressure (Admit)  104/60  130/68      Blood Pressure (Exercise)  124/78  124/60      Blood Pressure (Exit)  124/62  126/78      Heart Rate (Admit)  69 bpm  70 bpm      Heart Rate (Exercise)  102 bpm  105 bpm      Heart Rate (Exit)  78 bpm  80 bpm      Rating of Perceived Exertion (Exercise)  12  12      Symptoms  none  none      Comments  Pt first day of exercise.   -      Duration  Continue with 30 min of aerobic exercise without signs/symptoms of physical distress.  Continue with 30 min of aerobic exercise without signs/symptoms of physical distress.      Intensity  THRR unchanged  THRR unchanged        Progression   Progression  Continue to progress workloads to maintain intensity without signs/symptoms of physical distress.  Continue to progress workloads to maintain intensity without signs/symptoms of physical distress.      Average METs  2.8  3.3        Resistance Training   Training Prescription  Yes  No      Weight  5 lbs.   -      Reps  10-15  -      Time  10 Minutes  -        Interval Training   Interval Training  No  -        Treadmill   MPH  2.5  3.1      Grade  1  1      Minutes  15  15        NuStep   Level  4  3      SPM  85  85      Minutes  15  15      METs  2.4  2.8        Home Exercise Plan   Plans to continue exercise at  -  Home (comment)      Frequency  -  Add 3 additional days to program exercise sessions.      Initial Home Exercises Provided  -  06/21/19         Exercise Comments: Exercise Comments    Row Name 06/07/19 1532 06/24/19 1518         Exercise Comments  Pt first day of exercise. Pt tolerated exercise Rx well.  Pt is tolerating exercise Rx well and progressing well.         Exercise Goals and Review: Exercise Goals    Row Name 06/03/19 1040             Exercise Goals   Increase Physical Activity  Yes       Intervention  Provide advice, education, support and counseling about physical activity/exercise  needs.;Develop an individualized exercise prescription for aerobic and resistive training based on initial evaluation findings, risk stratification, comorbidities and participant's personal goals.       Expected Outcomes  Short Term: Attend rehab on a regular basis to increase amount of physical activity.;Long Term: Add in home exercise to make exercise part of routine and to increase amount of physical activity.;Long  Term: Exercising regularly at least 3-5 days a week.       Increase Strength and Stamina  Yes       Intervention  Provide advice, education, support and counseling about physical activity/exercise needs.;Develop an individualized exercise prescription for aerobic and resistive training based on initial evaluation findings, risk stratification, comorbidities and participant's personal goals.       Expected Outcomes  Short Term: Increase workloads from initial exercise prescription for resistance, speed, and METs.;Short Term: Perform resistance training exercises routinely during rehab and add in resistance training at home;Long Term: Improve cardiorespiratory fitness, muscular endurance and strength as measured by increased METs and functional capacity (6MWT)       Able to understand and use rate of perceived exertion (RPE) scale  Yes       Intervention  Provide education and explanation on how to use RPE scale       Expected Outcomes  Short Term: Able to use RPE daily in rehab to express subjective intensity level;Long Term:  Able to use RPE to guide intensity level when exercising independently       Knowledge and understanding of Target Heart Rate Range (THRR)  Yes       Intervention  Provide education and explanation of THRR including how the numbers were predicted and where they are located for reference       Expected Outcomes  Short Term: Able to state/look up THRR;Long Term: Able to use THRR to govern intensity when exercising independently;Short Term: Able to use daily as guideline  for intensity in rehab       Able to check pulse independently  Yes       Intervention  Provide education and demonstration on how to check pulse in carotid and radial arteries.;Review the importance of being able to check your own pulse for safety during independent exercise       Expected Outcomes  Short Term: Able to explain why pulse checking is important during independent exercise;Long Term: Able to check pulse independently and accurately       Understanding of Exercise Prescription  Yes       Intervention  Provide education, explanation, and written materials on patient's individual exercise prescription       Expected Outcomes  Short Term: Able to explain program exercise prescription;Long Term: Able to explain home exercise prescription to exercise independently          Exercise Goals Re-Evaluation : Exercise Goals Re-Evaluation    Row Name 06/07/19 1531 06/24/19 1517           Exercise Goal Re-Evaluation   Exercise Goals Review  Increase Physical Activity;Increase Strength and Stamina;Able to understand and use rate of perceived exertion (RPE) scale;Knowledge and understanding of Target Heart Rate Range (THRR);Understanding of Exercise Prescription  Increase Physical Activity;Increase Strength and Stamina;Able to understand and use rate of perceived exertion (RPE) scale;Knowledge and understanding of Target Heart Rate Range (THRR);Able to check pulse independently;Understanding of Exercise Prescription      Comments  Pt first day of CR program. Pt tolerated exercise Rx well. Pt understands THRR, exercise Rx, and RPE scale.  Pt is tolerating exercise Rx well and progressing well in CR program. Pt MET level and workloads are increasing. MET level is 3.3. Pt is continuing to exercise at home in addition to CR program.      Expected Outcomes  Will continue to monitor and progress Pt as tolerated.  Will continue to monitor and progress Pt as tolerated.  Discharge Exercise  Prescription (Final Exercise Prescription Changes): Exercise Prescription Changes - 06/23/19 1330      Response to Exercise   Blood Pressure (Admit)  130/68    Blood Pressure (Exercise)  124/60    Blood Pressure (Exit)  126/78    Heart Rate (Admit)  70 bpm    Heart Rate (Exercise)  105 bpm    Heart Rate (Exit)  80 bpm    Rating of Perceived Exertion (Exercise)  12    Symptoms  none    Duration  Continue with 30 min of aerobic exercise without signs/symptoms of physical distress.    Intensity  THRR unchanged      Progression   Progression  Continue to progress workloads to maintain intensity without signs/symptoms of physical distress.    Average METs  3.3      Resistance Training   Training Prescription  No      Treadmill   MPH  3.1    Grade  1    Minutes  15      NuStep   Level  3    SPM  85    Minutes  15    METs  2.8      Home Exercise Plan   Plans to continue exercise at  Home (comment)    Frequency  Add 3 additional days to program exercise sessions.    Initial Home Exercises Provided  06/21/19       Nutrition:  Target Goals: Understanding of nutrition guidelines, daily intake of sodium <1570m, cholesterol <2053m calories 30% from fat and 7% or less from saturated fats, daily to have 5 or more servings of fruits and vegetables.  Biometrics: Pre Biometrics - 06/03/19 1041      Pre Biometrics   Height  '6\' 2"'  (1.88 m)    Weight  105.7 kg    Waist Circumference  43.5 inches    Hip Circumference  44.5 inches    Waist to Hip Ratio  0.98 %    BMI (Calculated)  29.91    Triceps Skinfold  30 mm    % Body Fat  32.2 %    Grip Strength  48 kg    Flexibility  15 in    Single Leg Stand  15.43 seconds        Nutrition Therapy Plan and Nutrition Goals:   Nutrition Assessments:   Nutrition Goals Re-Evaluation:   Nutrition Goals Discharge (Final Nutrition Goals Re-Evaluation):   Psychosocial: Target Goals: Acknowledge presence or absence of significant  depression and/or stress, maximize coping skills, provide positive support system. Participant is able to verbalize types and ability to use techniques and skills needed for reducing stress and depression.  Initial Review & Psychosocial Screening: Initial Psych Review & Screening - 06/03/19 1025      Initial Review   Current issues with  None Identified      Family Dynamics   Good Support System?  Yes      Barriers   Psychosocial barriers to participate in program  There are no identifiable barriers or psychosocial needs.      Screening Interventions   Interventions  Encouraged to exercise       Quality of Life Scores: Quality of Life - 06/03/19 1129      Quality of Life   Select  Quality of Life      Quality of Life Scores   Health/Function Pre  22 %    Socioeconomic Pre  32.69 %  Psych/Spiritual Pre  23.79 %    Family Pre  24 %    GLOBAL Pre  25.09 %      Scores of 19 and below usually indicate a poorer quality of life in these areas.  A difference of  2-3 points is a clinically meaningful difference.  A difference of 2-3 points in the total score of the Quality of Life Index has been associated with significant improvement in overall quality of life, self-image, physical symptoms, and general health in studies assessing change in quality of life.  PHQ-9: Recent Review Flowsheet Data    Depression screen [facility] 2/9 06/03/2019 04/29/2019 10/15/2018 10/15/2018 09/13/2016   Decreased Interest 0 0 0 0 0   Down, Depressed, Hopeless 0 0 0 0 0   PHQ - 2 Score 0 0 0 0 0   Altered sleeping - 0 0 - -   Tired, decreased energy - 0 0 - -   Change in appetite - 0 0 - -   Feeling bad or failure about yourself  - 0 0 - -   Trouble concentrating - 0 0 - -   Moving slowly or fidgety/restless - 0 0 - -   Suicidal thoughts - 0 0 - -   PHQ-9 Score - 0 0 - -   Difficult doing work/chores - - Not difficult at all - -     Interpretation of Total Score  Total Score Depression Severity:  1-4 =  Minimal depression, 5-9 = Mild depression, 10-14 = Moderate depression, 15-19 = Moderately severe depression, 20-27 = Severe depression   Psychosocial Evaluation and Intervention:   Psychosocial Re-Evaluation: Psychosocial Re-Evaluation    Row Name 06/24/19 1536             Psychosocial Re-Evaluation   Current issues with  None Identified       Interventions  Encouraged to attend Cardiac Rehabilitation for the exercise       Continue Psychosocial Services   No Follow up required          Psychosocial Discharge (Final Psychosocial Re-Evaluation): Psychosocial Re-Evaluation - 06/24/19 1536      Psychosocial Re-Evaluation   Current issues with  None Identified    Interventions  Encouraged to attend Cardiac Rehabilitation for the exercise    Continue Psychosocial Services   No Follow up required       Vocational Rehabilitation: Provide vocational rehab assistance to qualifying candidates.   Vocational Rehab Evaluation & Intervention: Vocational Rehab - 06/03/19 1026      Initial Vocational Rehab Evaluation & Intervention   Assessment shows need for Vocational Rehabilitation  No       Education: Education Goals: Education classes will be provided on a weekly basis, covering required topics. Participant will state understanding/return demonstration of topics presented.  Learning Barriers/Preferences: Learning Barriers/Preferences - 06/03/19 1042      Learning Barriers/Preferences   Learning Barriers  None    Learning Preferences  Video;Skilled Demonstration;Individual Instruction;Pictoral       Education Topics: Hypertension, Hypertension Reduction -Define heart disease and high blood pressure. Discus how high blood pressure affects the body and ways to reduce high blood pressure.   Exercise and Your Heart -Discuss why it is important to exercise, the FITT principles of exercise, normal and abnormal responses to exercise, and how to exercise  safely.   Angina -Discuss definition of angina, causes of angina, treatment of angina, and how to decrease risk of having angina.   Cardiac Medications -Review what  the following cardiac medications are used for, how they affect the body, and side effects that may occur when taking the medications.  Medications include Aspirin, Beta blockers, calcium channel blockers, ACE Inhibitors, angiotensin receptor blockers, diuretics, digoxin, and antihyperlipidemics.   Congestive Heart Failure -Discuss the definition of CHF, how to live with CHF, the signs and symptoms of CHF, and how keep track of weight and sodium intake.   Heart Disease and Intimacy -Discus the effect sexual activity has on the heart, how changes occur during intimacy as we age, and safety during sexual activity.   Smoking Cessation / COPD -Discuss different methods to quit smoking, the health benefits of quitting smoking, and the definition of COPD.   Nutrition I: Fats -Discuss the types of cholesterol, what cholesterol does to the heart, and how cholesterol levels can be controlled.   Nutrition II: Labels -Discuss the different components of food labels and how to read food label   Heart Parts/Heart Disease and PAD -Discuss the anatomy of the heart, the pathway of blood circulation through the heart, and these are affected by heart disease.   Stress I: Signs and Symptoms -Discuss the causes of stress, how stress may lead to anxiety and depression, and ways to limit stress.   Stress II: Relaxation -Discuss different types of relaxation techniques to limit stress.   Warning Signs of Stroke / TIA -Discuss definition of a stroke, what the signs and symptoms are of a stroke, and how to identify when someone is having stroke.   Knowledge Questionnaire Score: Knowledge Questionnaire Score - 06/03/19 1130      Knowledge Questionnaire Score   Pre Score  23/24       Core Components/Risk Factors/Patient Goals  at Admission: Personal Goals and Risk Factors at Admission - 06/03/19 1147      Core Components/Risk Factors/Patient Goals on Admission    Weight Management  Yes;Weight Maintenance;Weight Loss    Intervention  Obesity: Provide education and appropriate resources to help participant work on and attain dietary goals.;Weight Management/Obesity: Establish reasonable short term and long term weight goals.;Weight Management: Provide education and appropriate resources to help participant work on and attain dietary goals.    Hypertension  Yes    Intervention  Provide education on lifestyle modifcations including regular physical activity/exercise, weight management, moderate sodium restriction and increased consumption of fresh fruit, vegetables, and low fat dairy, alcohol moderation, and smoking cessation.;Monitor prescription use compliance.    Expected Outcomes  Short Term: Continued assessment and intervention until BP is < 140/53m HG in hypertensive participants. < 130/842mHG in hypertensive participants with diabetes, heart failure or chronic kidney disease.;Long Term: Maintenance of blood pressure at goal levels.    Lipids  Yes    Intervention  Provide education and support for participant on nutrition & aerobic/resistive exercise along with prescribed medications to achieve LDL <7054mHDL >29m47m  Expected Outcomes  Short Term: Participant states understanding of desired cholesterol values and is compliant with medications prescribed. Participant is following exercise prescription and nutrition guidelines.;Long Term: Cholesterol controlled with medications as prescribed, with individualized exercise RX and with personalized nutrition plan. Value goals: LDL < 70mg100mL > 40 mg.       Core Components/Risk Factors/Patient Goals Review:  Goals and Risk Factor Review    Row Name 06/24/19 1536             Core Components/Risk Factors/Patient Goals Review   Personal Goals Review  Weight  Management/Obesity;Lipids;Hypertension  Review  Shann has been doing well with exercise. Pete's vital signs have been stable.       Expected Outcomes  Jairus will continue to participate in phase 2 cardiac rehab for exercise, nutrtion and lifestyle modifications          Core Components/Risk Factors/Patient Goals at Discharge (Final Review):  Goals and Risk Factor Review - 06/24/19 1536      Core Components/Risk Factors/Patient Goals Review   Personal Goals Review  Weight Management/Obesity;Lipids;Hypertension    Review  [place] has been doing well with exercise. Pete's vital signs have been stable.    Expected Outcomes  Victorious will continue to participate in phase 2 cardiac rehab for exercise, nutrtion and lifestyle modifications       ITP Comments: ITP Comments    Row Name 06/03/19 1025 06/24/19 1534         ITP Comments  Dr Fransico Him MD, Medical Director  30 Day ITP Review. Patient is with good participation and attendance in phase 2 cardiac rehab.         Comments: See ITP comments.Barnet Pall, RN,BSN 06/24/2019 3:43 PM

## 2019-06-25 ENCOUNTER — Other Ambulatory Visit: Payer: Self-pay

## 2019-06-25 ENCOUNTER — Encounter (HOSPITAL_COMMUNITY)
Admission: RE | Admit: 2019-06-25 | Discharge: 2019-06-25 | Disposition: A | Discharge status: Home / Self Care | Payer: Medicare Other | Source: Ambulatory Visit | Attending: Cardiovascular Disease | Admitting: Cardiovascular Disease

## 2019-06-25 DIAGNOSIS — I214 Non-ST elevation (NSTEMI) myocardial infarction: Secondary | ICD-10-CM

## 2019-06-25 DIAGNOSIS — Z955 Presence of coronary angioplasty implant and graft: Secondary | ICD-10-CM | POA: Diagnosis not present

## 2019-06-28 ENCOUNTER — Encounter (HOSPITAL_COMMUNITY)
Admission: RE | Admit: 2019-06-28 | Discharge: 2019-06-28 | Disposition: A | Discharge status: Home / Self Care | Payer: Medicare Other | Source: Ambulatory Visit | Attending: Cardiovascular Disease | Admitting: Cardiovascular Disease

## 2019-06-28 ENCOUNTER — Other Ambulatory Visit: Payer: Self-pay

## 2019-06-28 ENCOUNTER — Other Ambulatory Visit: Payer: Medicare Other | Admitting: *Deleted

## 2019-06-28 DIAGNOSIS — Z955 Presence of coronary angioplasty implant and graft: Secondary | ICD-10-CM | POA: Diagnosis not present

## 2019-06-28 DIAGNOSIS — I214 Non-ST elevation (NSTEMI) myocardial infarction: Secondary | ICD-10-CM | POA: Diagnosis not present

## 2019-06-28 DIAGNOSIS — E785 Hyperlipidemia, unspecified: Secondary | ICD-10-CM | POA: Diagnosis not present

## 2019-06-28 LAB — HEPATIC FUNCTION PANEL
ALT: 18 IU/L (ref 0–44)
AST: 17 IU/L (ref 0–40)
Albumin: 4.2 g/dL (ref 3.8–4.8)
Alkaline Phosphatase: 84 IU/L (ref 39–117)
Bilirubin Total: 0.6 mg/dL (ref 0.0–1.2)
Bilirubin, Direct: 0.19 mg/dL (ref 0.00–0.40)
Total Protein: 7 g/dL (ref 6.0–8.5)

## 2019-06-28 LAB — LIPID PANEL
Chol/HDL Ratio: 2.9 ratio (ref 0.0–5.0)
Cholesterol, Total: 106 mg/dL (ref 100–199)
HDL: 36 mg/dL — ABNORMAL LOW (ref >39)
LDL Chol Calc (NIH): 57 mg/dL (ref 0–99)
Triglycerides: 58 mg/dL (ref 0–149)
VLDL Cholesterol Cal: 13 mg/dL (ref 5–40)

## 2019-06-29 NOTE — Telephone Encounter (Signed)
Follow Up  Patient's HR representative (Angie Mauro Kaufmann) from patient's work is calling in to get a revised returned to work letter for the patient. States that patient is needed back at work for travel on 07/05/19. If possible, please send a revised back to work note.   Meliton Rattan Phone: [phone] Fax: [phone]

## 2019-06-30 ENCOUNTER — Encounter (HOSPITAL_COMMUNITY)
Admission: RE | Admit: 2019-06-30 | Discharge: 2019-06-30 | Disposition: A | Discharge status: Home / Self Care | Payer: Medicare Other | Source: Ambulatory Visit | Attending: Cardiovascular Disease | Admitting: Cardiovascular Disease

## 2019-06-30 ENCOUNTER — Other Ambulatory Visit: Payer: Self-pay

## 2019-06-30 DIAGNOSIS — I214 Non-ST elevation (NSTEMI) myocardial infarction: Secondary | ICD-10-CM | POA: Diagnosis not present

## 2019-06-30 DIAGNOSIS — Z955 Presence of coronary angioplasty implant and graft: Secondary | ICD-10-CM

## 2019-06-30 NOTE — Telephone Encounter (Signed)
Follow-Up:  Charles Mccoy was calling back to see if the patient could be released to go back to work on 09/28. The earlier day will be for traveling purposes. Please advise

## 2019-06-30 NOTE — Telephone Encounter (Signed)
The patient is clear to return to work

## 2019-06-30 NOTE — Telephone Encounter (Signed)
The patient may return to work

## 2019-06-30 NOTE — Telephone Encounter (Signed)
Called Angie, pts HR rep and lmtcb regarding work note needed for patient

## 2019-06-30 NOTE — Telephone Encounter (Signed)
Called Angie from pts HR to clarify what is needed as a note has already been given. LMTCB

## 2019-07-02 ENCOUNTER — Other Ambulatory Visit: Payer: Self-pay

## 2019-07-02 ENCOUNTER — Encounter (HOSPITAL_COMMUNITY)
Admission: RE | Admit: 2019-07-02 | Discharge: 2019-07-02 | Disposition: A | Discharge status: Home / Self Care | Payer: Medicare Other | Source: Ambulatory Visit | Attending: Cardiovascular Disease | Admitting: Cardiovascular Disease

## 2019-07-02 DIAGNOSIS — Z955 Presence of coronary angioplasty implant and graft: Secondary | ICD-10-CM | POA: Diagnosis not present

## 2019-07-02 DIAGNOSIS — I214 Non-ST elevation (NSTEMI) myocardial infarction: Secondary | ICD-10-CM | POA: Diagnosis not present

## 2019-07-02 NOTE — Telephone Encounter (Addendum)
Angie from patient's HR is calling needs letter stating that patient can return to work on 07/05/19.  She states the letter she has states he can return to work on 9/29, but he needs to travel for work. She can be reached at [phone] or email [email or web address]

## 2019-07-02 NOTE — Telephone Encounter (Signed)
Pt came into office today and picked up new return to work note

## 2019-07-05 ENCOUNTER — Encounter (HOSPITAL_COMMUNITY): Payer: Medicare Other

## 2019-07-07 ENCOUNTER — Encounter (HOSPITAL_COMMUNITY): Payer: Medicare Other

## 2019-07-07 ENCOUNTER — Ambulatory Visit (INDEPENDENT_AMBULATORY_CARE_PROVIDER_SITE_OTHER): Payer: Medicare Other | Admitting: *Deleted

## 2019-07-07 DIAGNOSIS — I639 Cerebral infarction, unspecified: Secondary | ICD-10-CM | POA: Diagnosis not present

## 2019-07-08 LAB — CUP PACEART REMOTE DEVICE CHECK
Date Time Interrogation Session: 20200930214441
Implantable Pulse Generator Implant Date: 20190424

## 2019-07-09 ENCOUNTER — Encounter (HOSPITAL_COMMUNITY): Payer: Medicare Other

## 2019-07-12 ENCOUNTER — Encounter (HOSPITAL_COMMUNITY): Payer: Medicare Other

## 2019-07-14 ENCOUNTER — Encounter (HOSPITAL_COMMUNITY): Payer: Medicare Other

## 2019-07-14 NOTE — Progress Notes (Signed)
Carelink Summary Report / Loop Recorder 

## 2019-07-16 ENCOUNTER — Encounter (HOSPITAL_COMMUNITY): Payer: Medicare Other

## 2019-07-19 ENCOUNTER — Encounter (HOSPITAL_COMMUNITY)
Admission: RE | Admit: 2019-07-19 | Discharge: 2019-07-19 | Disposition: A | Discharge status: Home / Self Care | Payer: Medicare Other | Source: Ambulatory Visit | Attending: Cardiovascular Disease | Admitting: Cardiovascular Disease

## 2019-07-19 ENCOUNTER — Other Ambulatory Visit: Payer: Self-pay

## 2019-07-19 DIAGNOSIS — Z955 Presence of coronary angioplasty implant and graft: Secondary | ICD-10-CM | POA: Diagnosis not present

## 2019-07-19 DIAGNOSIS — I214 Non-ST elevation (NSTEMI) myocardial infarction: Secondary | ICD-10-CM | POA: Insufficient documentation

## 2019-07-21 ENCOUNTER — Other Ambulatory Visit: Payer: Self-pay

## 2019-07-21 ENCOUNTER — Encounter (HOSPITAL_COMMUNITY): Payer: Medicare Other

## 2019-07-21 ENCOUNTER — Encounter (HOSPITAL_COMMUNITY)
Admission: RE | Admit: 2019-07-21 | Discharge: 2019-07-21 | Disposition: A | Discharge status: Home / Self Care | Payer: Medicare Other | Source: Ambulatory Visit | Attending: Cardiovascular Disease | Admitting: Cardiovascular Disease

## 2019-07-21 DIAGNOSIS — I214 Non-ST elevation (NSTEMI) myocardial infarction: Secondary | ICD-10-CM | POA: Diagnosis not present

## 2019-07-21 DIAGNOSIS — Z955 Presence of coronary angioplasty implant and graft: Secondary | ICD-10-CM

## 2019-07-22 ENCOUNTER — Other Ambulatory Visit: Payer: Self-pay | Admitting: Adult Health

## 2019-07-22 NOTE — Progress Notes (Signed)
Cardiac Individual Treatment Plan  Patient Details  Name: Charles Mccoy MRN: [id number] Date of Birth: 1951/02/04 Referring Provider:     [facility] from 06/03/2019 in [facility]  Referring Provider  Dr. Cathie Olden      Initial Encounter Date:    CARDIAC REHAB PHASE II ORIENTATION from 06/03/2019 in [facility]  Date  06/03/19      Visit Diagnosis: NSTEMI (non-ST elevated myocardial infarction) ([place]) 05/03/19  S/P coronary artery stent placement 05/04/19 S/P DES LAD  Patient's Home Medications on Admission:  Current Outpatient Medications:  .  albuterol (VENTOLIN HFA) 108 (90 Base) MCG/ACT inhaler, Inhale 1 puff into the lungs as needed. Use as directed, Disp: , Rfl:  .  amLODipine (NORVASC) 10 MG tablet, TAKE 1 TABLET BY MOUTH EVERY DAY IN THE MORNING, Disp: 90 tablet, Rfl: 1 .  aspirin 81 MG EC tablet, Take 1 tablet (81 mg total) by mouth daily., Disp: 90 tablet, Rfl: 3 .  atorvastatin (LIPITOR) 80 MG tablet, Take 1 tablet (80 mg total) by mouth daily at 6 PM., Disp: 90 tablet, Rfl: 1 .  azelastine (ASTELIN) 0.1 % nasal spray, Place 1 spray into both nostrils as needed for allergies. , Disp: , Rfl: 5 .  fexofenadine (ALLEGRA) 60 MG tablet, Take 60 mg by mouth as needed for allergies. , Disp: , Rfl:  .  Fluticasone-Salmeterol 232-14 MCG/ACT AEPB, Inhale 1 puff into the lungs 2 (two) times daily., Disp: , Rfl:  .  montelukast (SINGULAIR) 10 MG tablet, Take 10 mg by mouth every morning., Disp: , Rfl:  .  nitroGLYCERIN (NITROSTAT) 0.4 MG SL tablet, Place 1 tablet (0.4 mg total) under the tongue every 5 (five) minutes as needed for chest pain., Disp: 25 tablet, Rfl: 0 .  pantoprazole (PROTONIX) 40 MG tablet, Take 1 tablet (40 mg total) by mouth daily., Disp: 90 tablet, Rfl: 1 .  predniSONE (STERAPRED UNI-PAK 21 TAB) 10 MG (21) TBPK tablet, Take as directed (Patient not taking: Reported on 05/27/2019), Disp: 21  tablet, Rfl: 0 .  spironolactone (ALDACTONE) 25 MG tablet, Take 1 tablet (25 mg total) by mouth daily., Disp: 90 tablet, Rfl: 1 .  ticagrelor (BRILINTA) 90 MG TABS tablet, Take 1 tablet (90 mg total) by mouth 2 (two) times daily., Disp: 180 tablet, Rfl: 3  Past Medical History: Past Medical History:  Diagnosis Date  . ALLERGIC RHINITIS 06/19/2007  . ASTHMA 08/17/2008  . Back pain 05/2016  . CAD (coronary artery disease)    05/03/19- NSTEMI, 05/04/19 DES to LAD  . COPD (chronic obstructive pulmonary disease) ([place])   . GERD (gastroesophageal reflux disease)   . Hyperlipidemia   . HYPERTENSION 06/19/2007  . OSTEOARTHRITIS 10/09/2009   wrists, knee.  . Pneumonia    hx of   . SINUSITIS 09/15/2007   recent issuses 2 weeks ago-clearing.  . Stroke ([place])     Tobacco Use: Social History   Tobacco Use  Smoking Status Former Smoker  . Types: Cigarettes  . Quit date: 10/07/2001  . Years since quitting: 17.8  Smokeless Tobacco Never Used    Labs: Recent Chemical engineer    Labs for ITP Cardiac and Pulmonary Rehab Latest Ref Rng & Units 01/18/2018 01/19/2018 06/09/2018 05/04/2019 06/28/2019   Cholestrol 100 - 199 mg/dL - 168 115 120 106   LDLCALC 0 - 99 mg/dL - 113(H) 63 69 57   LDLDIRECT mg/dL - - - - -  HDL >39 mg/dL - 39(L) 38(L) 32(L) 36(L)   Trlycerides 0 - 149 mg/dL - 79 69 94 58   Hemoglobin A1c 4.8 - 5.6 % - 5.4 - 5.5 -   TCO2 22 - 32 mmol/L 26 - - - -      Capillary Blood Glucose: Lab Results  Component Value Date   GLUCAP 129 (H) 01/19/2018   GLUCAP 92 01/18/2018     Exercise Target Goals: Exercise Program Goal: Individual exercise prescription set using results from initial 6 min walk test and THRR while considering  patient's activity barriers and safety.   Exercise Prescription Goal: Starting with aerobic activity 30 plus minutes a day, 3 days per week for initial exercise prescription. Provide home exercise prescription and guidelines that participant acknowledges  understanding prior to discharge.  Activity Barriers & Risk Stratification: Activity Barriers & Cardiac Risk Stratification - 06/03/19 1039      Activity Barriers & Cardiac Risk Stratification   Activity Barriers  Deconditioning    Cardiac Risk Stratification  High       6 Minute Walk: 6 Minute Walk    Row Name 06/03/19 1038         6 Minute Walk   Phase  Initial     Distance  1581 feet     Walk Time  6 minutes     # of Rest Breaks  0     MPH  2.9     METS  3.5     RPE  11     Perceived Dyspnea   0     VO2 Peak  12.26     Symptoms  No     Resting HR  61 bpm     Resting BP  122/62     Resting Oxygen Saturation   98 %     Exercise Oxygen Saturation  during 6 min walk  96 %     Max Ex. HR  98 bpm     Max Ex. BP  128/70     2 Minute Post BP  116/62        Oxygen Initial Assessment:   Oxygen Re-Evaluation:   Oxygen Discharge (Final Oxygen Re-Evaluation):   Initial Exercise Prescription: Initial Exercise Prescription - 06/03/19 1000      Date of Initial Exercise RX and Referring Provider   Date  06/03/19    Referring Provider  Dr. Cathie Olden    Expected Discharge Date  07/16/19      Treadmill   MPH  3    Grade  1    Minutes  15      NuStep   Level  3    SPM  85    Minutes  15    METs  3      Prescription Details   Frequency (times per week)  3    Duration  Progress to 30 minutes of continuous aerobic without signs/symptoms of physical distress      Intensity   THRR 40-80% of Max Heartrate  62-123    Ratings of Perceived Exertion  11-13      Progression   Progression  Continue to progress workloads to maintain intensity without signs/symptoms of physical distress.      Resistance Training   Training Prescription  Yes    Weight  5 lbs.     Reps  10-15       Perform Capillary Blood Glucose checks as needed.  Exercise Prescription Changes: Exercise Prescription Changes  [place] Name 06/07/19 1500 06/23/19 1330 07/19/19 1330         Response  to Exercise   Blood Pressure (Admit)  104/60  130/68  122/60     Blood Pressure (Exercise)  124/78  124/60  139/74     Blood Pressure (Exit)  124/62  126/78  114/70     Heart Rate (Admit)  69 bpm  70 bpm  80 bpm     Heart Rate (Exercise)  102 bpm  105 bpm  102 bpm     Heart Rate (Exit)  78 bpm  80 bpm  80 bpm     Rating of Perceived Exertion (Exercise)  '12  12  12     ' Symptoms  none  none  none     Comments  Pt first day of exercise.   -  -     Duration  Continue with 30 min of aerobic exercise without signs/symptoms of physical distress.  Continue with 30 min of aerobic exercise without signs/symptoms of physical distress.  Continue with 30 min of aerobic exercise without signs/symptoms of physical distress.     Intensity  THRR unchanged  THRR unchanged  THRR unchanged       Progression   Progression  Continue to progress workloads to maintain intensity without signs/symptoms of physical distress.  Continue to progress workloads to maintain intensity without signs/symptoms of physical distress.  Continue to progress workloads to maintain intensity without signs/symptoms of physical distress.     Average METs  2.8  3.3  4       Resistance Training   Training Prescription  Yes  No  Yes     Weight  5 lbs.   -  5 lbs.      Reps  10-15  -  10-15     Time  10 Minutes  -  10 Minutes       Interval Training   Interval Training  No  -  No       Treadmill   MPH  2.5  3.1  3.3     Grade  '1  1  1     ' Minutes  '15  15  15       ' NuStep   Level  '4  3  5     ' SPM  85  85  85     Minutes  '15  15  15     ' METs  2.4  2.8  4.1       Home Exercise Plan   Plans to continue exercise at  -  Home (comment)  Home (comment)     Frequency  -  Add 3 additional days to program exercise sessions.  Add 3 additional days to program exercise sessions.     Initial Home Exercises Provided  -  06/21/19  06/21/19        Exercise Comments: Exercise Comments    Row Name 06/07/19 1532 06/24/19 1518 07/22/19 1114        Exercise Comments  Pt first day of exercise. Pt tolerated exercise Rx well.  Pt is tolerating exercise Rx well and progressing well.  Reviewed METs and goals with Pt. Pt is progressing well.        Exercise Goals and Review: Exercise Goals    Row Name 06/03/19 1040             Exercise Goals   Increase Physical Activity  Yes  Intervention  Provide advice, education, support and counseling about physical activity/exercise needs.;Develop an individualized exercise prescription for aerobic and resistive training based on initial evaluation findings, risk stratification, comorbidities and participant's personal goals.       Expected Outcomes  Short Term: Attend rehab on a regular basis to increase amount of physical activity.;Long Term: Add in home exercise to make exercise part of routine and to increase amount of physical activity.;Long Term: Exercising regularly at least 3-5 days a week.       Increase Strength and Stamina  Yes       Intervention  Provide advice, education, support and counseling about physical activity/exercise needs.;Develop an individualized exercise prescription for aerobic and resistive training based on initial evaluation findings, risk stratification, comorbidities and participant's personal goals.       Expected Outcomes  Short Term: Increase workloads from initial exercise prescription for resistance, speed, and METs.;Short Term: Perform resistance training exercises routinely during rehab and add in resistance training at home;Long Term: Improve cardiorespiratory fitness, muscular endurance and strength as measured by increased METs and functional capacity (6MWT)       Able to understand and use rate of perceived exertion (RPE) scale  Yes       Intervention  Provide education and explanation on how to use RPE scale       Expected Outcomes  Short Term: Able to use RPE daily in rehab to express subjective intensity level;Long Term:  Able to use RPE to guide  intensity level when exercising independently       Knowledge and understanding of Target Heart Rate Range (THRR)  Yes       Intervention  Provide education and explanation of THRR including how the numbers were predicted and where they are located for reference       Expected Outcomes  Short Term: Able to state/look up THRR;Long Term: Able to use THRR to govern intensity when exercising independently;Short Term: Able to use daily as guideline for intensity in rehab       Able to check pulse independently  Yes       Intervention  Provide education and demonstration on how to check pulse in carotid and radial arteries.;Review the importance of being able to check your own pulse for safety during independent exercise       Expected Outcomes  Short Term: Able to explain why pulse checking is important during independent exercise;Long Term: Able to check pulse independently and accurately       Understanding of Exercise Prescription  Yes       Intervention  Provide education, explanation, and written materials on patient's individual exercise prescription       Expected Outcomes  Short Term: Able to explain program exercise prescription;Long Term: Able to explain home exercise prescription to exercise independently          Exercise Goals Re-Evaluation : Exercise Goals Re-Evaluation    Row Name 06/07/19 1531 06/24/19 1517 07/21/19 1330         Exercise Goal Re-Evaluation   Exercise Goals Review  Increase Physical Activity;Increase Strength and Stamina;Able to understand and use rate of perceived exertion (RPE) scale;Knowledge and understanding of Target Heart Rate Range (THRR);Understanding of Exercise Prescription  Increase Physical Activity;Increase Strength and Stamina;Able to understand and use rate of perceived exertion (RPE) scale;Knowledge and understanding of Target Heart Rate Range (THRR);Able to check pulse independently;Understanding of Exercise Prescription  Increase Physical  Activity;Increase Strength and Stamina;Able to understand and use rate of perceived  exertion (RPE) scale;Knowledge and understanding of Target Heart Rate Range (THRR);Able to check pulse independently;Understanding of Exercise Prescription     Comments  Pt first day of CR program. Pt tolerated exercise Rx well. Pt understands THRR, exercise Rx, and RPE scale.  Pt is tolerating exercise Rx well and progressing well in CR program. Pt MET level and workloads are increasing. MET level is 3.3. Pt is continuing to exercise at home in addition to CR program.  Reviewed METs and goals with Pt. Pt is progressing well and has a MET level of 4.0. Pt continues to increase workloads and MET levels. Pt is currently walking for exercise 2-4 days per week for 30-45 minutes per day in addition to CR program.     Expected Outcomes  Will continue to monitor and progress Pt as tolerated.  Will continue to monitor and progress Pt as tolerated.  Will continue to monitor and progress Pt as tolerated.         Discharge Exercise Prescription (Final Exercise Prescription Changes): Exercise Prescription Changes - 07/19/19 1330      Response to Exercise   Blood Pressure (Admit)  122/60    Blood Pressure (Exercise)  139/74    Blood Pressure (Exit)  114/70    Heart Rate (Admit)  80 bpm    Heart Rate (Exercise)  102 bpm    Heart Rate (Exit)  80 bpm    Rating of Perceived Exertion (Exercise)  12    Symptoms  none    Duration  Continue with 30 min of aerobic exercise without signs/symptoms of physical distress.    Intensity  THRR unchanged      Progression   Progression  Continue to progress workloads to maintain intensity without signs/symptoms of physical distress.    Average METs  4      Resistance Training   Training Prescription  Yes    Weight  5 lbs.     Reps  10-15    Time  10 Minutes      Interval Training   Interval Training  No      Treadmill   MPH  3.3    Grade  1    Minutes  15      NuStep   Level   5    SPM  85    Minutes  15    METs  4.1      Home Exercise Plan   Plans to continue exercise at  Home (comment)    Frequency  Add 3 additional days to program exercise sessions.    Initial Home Exercises Provided  06/21/19       Nutrition:  Target Goals: Understanding of nutrition guidelines, daily intake of sodium <15103m, cholesterol <2079m calories 30% from fat and 7% or less from saturated fats, daily to have 5 or more servings of fruits and vegetables.  Biometrics: Pre Biometrics - 06/03/19 1041      Pre Biometrics   Height  '6\' 2"'  (1.88 m)    Weight  105.7 kg    Waist Circumference  43.5 inches    Hip Circumference  44.5 inches    Waist to Hip Ratio  0.98 %    BMI (Calculated)  29.91    Triceps Skinfold  30 mm    % Body Fat  32.2 %    Grip Strength  48 kg    Flexibility  15 in    Single Leg Stand  15.43 seconds  Nutrition Therapy Plan and Nutrition Goals:   Nutrition Assessments:   Nutrition Goals Re-Evaluation:   Nutrition Goals Discharge (Final Nutrition Goals Re-Evaluation):   Psychosocial: Target Goals: Acknowledge presence or absence of significant depression and/or stress, maximize coping skills, provide positive support system. Participant is able to verbalize types and ability to use techniques and skills needed for reducing stress and depression.  Initial Review & Psychosocial Screening: Initial Psych Review & Screening - 06/03/19 1025      Initial Review   Current issues with  None Identified      Family Dynamics   Good Support System?  Yes      Barriers   Psychosocial barriers to participate in program  There are no identifiable barriers or psychosocial needs.      Screening Interventions   Interventions  Encouraged to exercise       Quality of Life Scores: Quality of Life - 06/03/19 1129      Quality of Life   Select  Quality of Life      Quality of Life Scores   Health/Function Pre  22 %    Socioeconomic Pre  32.69 %     Psych/Spiritual Pre  23.79 %    Family Pre  24 %    GLOBAL Pre  25.09 %      Scores of 19 and below usually indicate a poorer quality of life in these areas.  A difference of  2-3 points is a clinically meaningful difference.  A difference of 2-3 points in the total score of the Quality of Life Index has been associated with significant improvement in overall quality of life, self-image, physical symptoms, and general health in studies assessing change in quality of life.  PHQ-9: Recent Review Flowsheet Data    Depression screen [facility] 2/9 06/03/2019 04/29/2019 10/15/2018 10/15/2018 09/13/2016   Decreased Interest 0 0 0 0 0   Down, Depressed, Hopeless 0 0 0 0 0   PHQ - 2 Score 0 0 0 0 0   Altered sleeping - 0 0 - -   Tired, decreased energy - 0 0 - -   Change in appetite - 0 0 - -   Feeling bad or failure about yourself  - 0 0 - -   Trouble concentrating - 0 0 - -   Moving slowly or fidgety/restless - 0 0 - -   Suicidal thoughts - 0 0 - -   PHQ-9 Score - 0 0 - -   Difficult doing work/chores - - Not difficult at all - -     Interpretation of Total Score  Total Score Depression Severity:  1-4 = Minimal depression, 5-9 = Mild depression, 10-14 = Moderate depression, 15-19 = Moderately severe depression, 20-27 = Severe depression   Psychosocial Evaluation and Intervention:   Psychosocial Re-Evaluation: Psychosocial Re-Evaluation    Row Name 06/24/19 1536 07/21/19 1704           Psychosocial Re-Evaluation   Current issues with  None Identified  None Identified      Comments  -  Patient continues to maintain a positive attitude and outlook.      Expected Outcomes  -  Patient will continue to maintain a positive attitude and outlook. He will utilize health stress management and his support system if psychosocial barriers or needs arise.      Interventions  Encouraged to attend Cardiac Rehabilitation for the exercise  Encouraged to attend Cardiac Rehabilitation for the exercise  Continue Psychosocial Services   No Follow up required  No Follow up required         Psychosocial Discharge (Final Psychosocial Re-Evaluation): Psychosocial Re-Evaluation - 07/21/19 1704      Psychosocial Re-Evaluation   Current issues with  None Identified    Comments  Patient continues to maintain a positive attitude and outlook.    Expected Outcomes  Patient will continue to maintain a positive attitude and outlook. He will utilize health stress management and his support system if psychosocial barriers or needs arise.    Interventions  Encouraged to attend Cardiac Rehabilitation for the exercise    Continue Psychosocial Services   No Follow up required       Vocational Rehabilitation: Provide vocational rehab assistance to qualifying candidates.   Vocational Rehab Evaluation & Intervention: Vocational Rehab - 06/03/19 1026      Initial Vocational Rehab Evaluation & Intervention   Assessment shows need for Vocational Rehabilitation  No       Education: Education Goals: Education classes will be provided on a weekly basis, covering required topics. Participant will state understanding/return demonstration of topics presented.  Learning Barriers/Preferences: Learning Barriers/Preferences - 06/03/19 1042      Learning Barriers/Preferences   Learning Barriers  None    Learning Preferences  Video;Skilled Demonstration;Individual Instruction;Pictoral       Education Topics: Hypertension, Hypertension Reduction -Define heart disease and high blood pressure. Discus how high blood pressure affects the body and ways to reduce high blood pressure.   Exercise and Your Heart -Discuss why it is important to exercise, the FITT principles of exercise, normal and abnormal responses to exercise, and how to exercise safely.   Angina -Discuss definition of angina, causes of angina, treatment of angina, and how to decrease risk of having angina.   Cardiac Medications -Review what  the following cardiac medications are used for, how they affect the body, and side effects that may occur when taking the medications.  Medications include Aspirin, Beta blockers, calcium channel blockers, ACE Inhibitors, angiotensin receptor blockers, diuretics, digoxin, and antihyperlipidemics.   Congestive Heart Failure -Discuss the definition of CHF, how to live with CHF, the signs and symptoms of CHF, and how keep track of weight and sodium intake.   Heart Disease and Intimacy -Discus the effect sexual activity has on the heart, how changes occur during intimacy as we age, and safety during sexual activity.   Smoking Cessation / COPD -Discuss different methods to quit smoking, the health benefits of quitting smoking, and the definition of COPD.   Nutrition I: Fats -Discuss the types of cholesterol, what cholesterol does to the heart, and how cholesterol levels can be controlled.   Nutrition II: Labels -Discuss the different components of food labels and how to read food label   Heart Parts/Heart Disease and PAD -Discuss the anatomy of the heart, the pathway of blood circulation through the heart, and these are affected by heart disease.   Stress I: Signs and Symptoms -Discuss the causes of stress, how stress may lead to anxiety and depression, and ways to limit stress.   Stress II: Relaxation -Discuss different types of relaxation techniques to limit stress.   Warning Signs of Stroke / TIA -Discuss definition of a stroke, what the signs and symptoms are of a stroke, and how to identify when someone is having stroke.   Knowledge Questionnaire Score: Knowledge Questionnaire Score - 06/03/19 1130      Knowledge Questionnaire Score   Pre Score  23/24  Core Components/Risk Factors/Patient Goals at Admission: Personal Goals and Risk Factors at Admission - 06/03/19 1147      Core Components/Risk Factors/Patient Goals on Admission    Weight Management  Yes;Weight  Maintenance;Weight Loss    Intervention  Obesity: Provide education and appropriate resources to help participant work on and attain dietary goals.;Weight Management/Obesity: Establish reasonable short term and long term weight goals.;Weight Management: Provide education and appropriate resources to help participant work on and attain dietary goals.    Hypertension  Yes    Intervention  Provide education on lifestyle modifcations including regular physical activity/exercise, weight management, moderate sodium restriction and increased consumption of fresh fruit, vegetables, and low fat dairy, alcohol moderation, and smoking cessation.;Monitor prescription use compliance.    Expected Outcomes  Short Term: Continued assessment and intervention until BP is < 140/50m HG in hypertensive participants. < 130/891mHG in hypertensive participants with diabetes, heart failure or chronic kidney disease.;Long Term: Maintenance of blood pressure at goal levels.    Lipids  Yes    Intervention  Provide education and support for participant on nutrition & aerobic/resistive exercise along with prescribed medications to achieve LDL <701mHDL >52m17m  Expected Outcomes  Short Term: Participant states understanding of desired cholesterol values and is compliant with medications prescribed. Participant is following exercise prescription and nutrition guidelines.;Long Term: Cholesterol controlled with medications as prescribed, with individualized exercise RX and with personalized nutrition plan. Value goals: LDL < 70mg39mL > 40 mg.       Core Components/Risk Factors/Patient Goals Review:  Goals and Risk Factor Review    Row Name 06/24/19 1536 07/21/19 1705           Core Components/Risk Factors/Patient Goals Review   Personal Goals Review  Weight Management/Obesity;Lipids;Hypertension  Weight Management/Obesity;Lipids;Hypertension      Review  PeterArranbeen doing well with exercise. Pete's vital signs have been  stable.  PeterSeydinabeen doing well with exercise. Pete's vital signs have been stable. He continues to take medications a prescribed. His weight remains stable.      Expected Outcomes  PeterTrent continue to participate in phase 2 cardiac rehab for exercise, nutrtion and lifestyle modifications  PeterKoltin continue to participate in phase 2 cardiac rehab for exercise, nutrtion and lifestyle modifications         Core Components/Risk Factors/Patient Goals at Discharge (Final Review):  Goals and Risk Factor Review - 07/21/19 1705      Core Components/Risk Factors/Patient Goals Review   Personal Goals Review  Weight Management/Obesity;Lipids;Hypertension    Review  PeterDonyeabeen doing well with exercise. Pete's vital signs have been stable. He continues to take medications a prescribed. His weight remains stable.    Expected Outcomes  PeterKano continue to participate in phase 2 cardiac rehab for exercise, nutrtion and lifestyle modifications       ITP Comments: ITP Comments    Row Name 06/03/19 1025 06/24/19 1534 07/21/19 1703       ITP Comments  Dr TraciFransico HimMedical Director  30 Day ITP Review. Patient is with good participation and attendance in phase 2 cardiac rehab.  30 Day ITP Review. Patient is with good participation and attendance in phase 2 cardiac rehab. VSS. Patient tolerating workload increases without cardiac symptoms.        Comments: See ITP comments

## 2019-07-23 ENCOUNTER — Other Ambulatory Visit: Payer: Self-pay

## 2019-07-23 ENCOUNTER — Encounter (HOSPITAL_COMMUNITY): Payer: Medicare Other

## 2019-07-23 ENCOUNTER — Encounter (HOSPITAL_COMMUNITY)
Admission: RE | Admit: 2019-07-23 | Discharge: 2019-07-23 | Disposition: A | Discharge status: Home / Self Care | Payer: Medicare Other | Source: Ambulatory Visit | Attending: Cardiovascular Disease | Admitting: Cardiovascular Disease

## 2019-07-23 VITALS — Ht 74.0 in | Wt 237.0 lb

## 2019-07-23 DIAGNOSIS — Z955 Presence of coronary angioplasty implant and graft: Secondary | ICD-10-CM

## 2019-07-23 DIAGNOSIS — I214 Non-ST elevation (NSTEMI) myocardial infarction: Secondary | ICD-10-CM

## 2019-07-26 ENCOUNTER — Other Ambulatory Visit: Payer: Self-pay

## 2019-07-26 ENCOUNTER — Encounter (HOSPITAL_COMMUNITY): Payer: Medicare Other

## 2019-07-26 ENCOUNTER — Encounter (HOSPITAL_COMMUNITY)
Admission: RE | Admit: 2019-07-26 | Discharge: 2019-07-26 | Disposition: A | Discharge status: Home / Self Care | Payer: Medicare Other | Source: Ambulatory Visit | Attending: Cardiovascular Disease | Admitting: Cardiovascular Disease

## 2019-07-26 DIAGNOSIS — I214 Non-ST elevation (NSTEMI) myocardial infarction: Secondary | ICD-10-CM

## 2019-07-26 DIAGNOSIS — Z955 Presence of coronary angioplasty implant and graft: Secondary | ICD-10-CM | POA: Diagnosis not present

## 2019-07-28 ENCOUNTER — Encounter (HOSPITAL_COMMUNITY)
Admission: RE | Admit: 2019-07-28 | Discharge: 2019-07-28 | Disposition: A | Discharge status: Home / Self Care | Payer: Medicare Other | Source: Ambulatory Visit | Attending: Cardiovascular Disease | Admitting: Cardiovascular Disease

## 2019-07-28 ENCOUNTER — Encounter (HOSPITAL_COMMUNITY): Payer: Medicare Other

## 2019-07-28 ENCOUNTER — Other Ambulatory Visit: Payer: Self-pay

## 2019-07-28 DIAGNOSIS — I214 Non-ST elevation (NSTEMI) myocardial infarction: Secondary | ICD-10-CM

## 2019-07-28 DIAGNOSIS — Z955 Presence of coronary angioplasty implant and graft: Secondary | ICD-10-CM

## 2019-07-28 NOTE — Progress Notes (Addendum)
Discharge Progress Report  Patient Details  Name: Charles Mccoy MRN: [id number] Date of Birth: 15-Aug-1951 Referring Provider:     [facility] from 06/03/2019 in [place]  Referring Provider  Dr. Cathie Olden       Number of Visits: 16  Reason for Discharge:  Patient reached a stable level of exercise. Patient independent in their exercise. Patient has met program and personal goals.  Smoking History:  Social History   Tobacco Use  Smoking Status Former Smoker  . Types: Cigarettes  . Quit date: 10/07/2001  . Years since quitting: 17.8  Smokeless Tobacco Never Used    Diagnosis:  NSTEMI (non-ST elevated myocardial infarction) ([facility]) 05/03/19  S/P coronary artery stent placement 05/04/19 S/P DES LAD  ADL [facility]:   Initial Exercise Prescription: Initial Exercise Prescription - 06/03/19 1000      Date of Initial Exercise RX and Referring Provider   Date  06/03/19    Referring Provider  Dr. Cathie Olden    Expected Discharge Date  07/16/19      Treadmill   MPH  3    Grade  1    Minutes  15      NuStep   Level  3    SPM  85    Minutes  15    METs  3      Prescription Details   Frequency (times per week)  3    Duration  Progress to 30 minutes of continuous aerobic without signs/symptoms of physical distress      Intensity   THRR 40-80% of Max Heartrate  62-123    Ratings of Perceived Exertion  11-13      Progression   Progression  Continue to progress workloads to maintain intensity without signs/symptoms of physical distress.      Resistance Training   Training Prescription  Yes    Weight  5 lbs.     Reps  10-15       Discharge Exercise Prescription (Final Exercise Prescription Changes): Exercise Prescription Changes - 07/28/19 1500      Response to Exercise   Blood Pressure (Admit)  118/64    Blood Pressure (Exercise)  144/70    Blood Pressure (Exit)  112/62    Heart Rate (Admit)  74 bpm    Heart Rate  (Exercise)  103 bpm    Heart Rate (Exit)  85 bpm    Rating of Perceived Exertion (Exercise)  12    Symptoms  none    Comments  Pt graduated Cr program today.     Duration  Continue with 30 min of aerobic exercise without signs/symptoms of physical distress.    Intensity  THRR unchanged      Progression   Progression  Continue to progress workloads to maintain intensity without signs/symptoms of physical distress.    Average METs  4.2      Resistance Training   Training Prescription  No      Interval Training   Interval Training  No      Treadmill   MPH  3.3    Grade  1    Minutes  15      NuStep   Level  5    SPM  85    Minutes  15    METs  4.4      Home Exercise Plan   Plans to continue exercise at  Home (comment)    Frequency  Add 3 additional  days to program exercise sessions.    Initial Home Exercises Provided  06/21/19       Functional Capacity: 6 Minute Walk    Row Name 06/03/19 1038 07/23/19 1539       6 Minute Walk   Phase  Initial  Discharge    Distance  1581 feet  1952 feet    Distance % Change  -  23.47 %    Distance Feet Change  -  371 ft    Walk Time  6 minutes  6 minutes    # of Rest Breaks  0  0    MPH  2.9  3.6    METS  3.5  4.2    RPE  11  12    Perceived Dyspnea   0  0    VO2 Peak  12.26  14.99    Symptoms  No  No    Resting HR  61 bpm  76 bpm    Resting BP  122/62  108/70    Resting Oxygen Saturation   98 %  -    Exercise Oxygen Saturation  during 6 min walk  96 %  -    Max Ex. HR  98 bpm  99 bpm    Max Ex. BP  128/70  150/74    2 Minute Post BP  116/62  112/70       Psychological, QOL, Others - Outcomes: PHQ 2/9: Depression screen [facility] 2/9 07/28/2019 06/03/2019 04/29/2019 10/15/2018 10/15/2018  Decreased Interest 0 0 0 0 0  Down, Depressed, Hopeless 0 0 0 0 0  PHQ - 2 Score 0 0 0 0 0  Altered sleeping - - 0 0 -  Tired, decreased energy - - 0 0 -  Change in appetite - - 0 0 -  Feeling bad or failure about yourself  - - 0 0 -   Trouble concentrating - - 0 0 -  Moving slowly or fidgety/restless - - 0 0 -  Suicidal thoughts - - 0 0 -  PHQ-9 Score - - 0 0 -  Difficult doing work/chores - - - Not difficult at all -    Quality of Life: Quality of Life - 07/23/19 1538      Quality of Life Scores   Health/Function Post  27.53 %    Socioeconomic Post  25 %    Psych/Spiritual Post  28.93 %    Family Post  27.6 %    GLOBAL Post  27.24 %       Personal Goals: Goals established at orientation with interventions provided to work toward goal. Personal Goals and Risk Factors at Admission - 06/03/19 1147      Core Components/Risk Factors/Patient Goals on Admission    Weight Management  Yes;Weight Maintenance;Weight Loss    Intervention  Obesity: Provide education and appropriate resources to help participant work on and attain dietary goals.;Weight Management/Obesity: Establish reasonable short term and long term weight goals.;Weight Management: Provide education and appropriate resources to help participant work on and attain dietary goals.    Hypertension  Yes    Intervention  Provide education on lifestyle modifcations including regular physical activity/exercise, weight management, moderate sodium restriction and increased consumption of fresh fruit, vegetables, and low fat dairy, alcohol moderation, and smoking cessation.;Monitor prescription use compliance.    Expected Outcomes  Short Term: Continued assessment and intervention until BP is < 140/82m HG in hypertensive participants. < 130/819mHG in hypertensive participants with diabetes, heart failure  or chronic kidney disease.;Long Term: Maintenance of blood pressure at goal levels.    Lipids  Yes    Intervention  Provide education and support for participant on nutrition & aerobic/resistive exercise along with prescribed medications to achieve LDL <18m, HDL >442m    Expected Outcomes  Short Term: Participant states understanding of desired cholesterol values  and is compliant with medications prescribed. Participant is following exercise prescription and nutrition guidelines.;Long Term: Cholesterol controlled with medications as prescribed, with individualized exercise RX and with personalized nutrition plan. Value goals: LDL < 7035mHDL > 40 mg.        Personal Goals Discharge: Goals and Risk Factor Review    Row Name 06/24/19 1536 07/21/19 1705 08/02/19 0745         Core Components/Risk Factors/Patient Goals Review   Personal Goals Review  Weight Management/Obesity;Lipids;Hypertension  Weight Management/Obesity;Lipids;Hypertension  -     Review  PetTaos been doing well with exercise. Pete's vital signs have been stable.  PetTedds been doing well with exercise. Pete's vital signs have been stable. He continues to take medications a prescribed. His weight remains stable.  PetRadfordntinues to do will with risk factor reduction. He is taking his medications as prescribed, exercising when not at CR, and maintaining his weight. He is adhering to a low cholesterol, low sodium diet. He monitors his BP at home and records.     Expected Outcomes  PetNeltonll continue to participate in phase 2 cardiac rehab for exercise, nutrtion and lifestyle modifications  PetAutumnll continue to participate in phase 2 cardiac rehab for exercise, nutrtion and lifestyle modifications  PetCainanll continue to exercise 5-7 days each week. He will continue his lifestyle modifications which includes adherence to heart healthy low cholesterol, low sodium diet, and adherence to prescribed medication regimine.        Exercise Goals and Review: Exercise Goals    Row Name 06/03/19 1040             Exercise Goals   Increase Physical Activity  Yes       Intervention  Provide advice, education, support and counseling about physical activity/exercise needs.;Develop an individualized exercise prescription for aerobic and resistive training based on initial evaluation findings, risk  stratification, comorbidities and participant's personal goals.       Expected Outcomes  Short Term: Attend rehab on a regular basis to increase amount of physical activity.;Long Term: Add in home exercise to make exercise part of routine and to increase amount of physical activity.;Long Term: Exercising regularly at least 3-5 days a week.       Increase Strength and Stamina  Yes       Intervention  Provide advice, education, support and counseling about physical activity/exercise needs.;Develop an individualized exercise prescription for aerobic and resistive training based on initial evaluation findings, risk stratification, comorbidities and participant's personal goals.       Expected Outcomes  Short Term: Increase workloads from initial exercise prescription for resistance, speed, and METs.;Short Term: Perform resistance training exercises routinely during rehab and add in resistance training at home;Long Term: Improve cardiorespiratory fitness, muscular endurance and strength as measured by increased METs and functional capacity (6MWT)       Able to understand and use rate of perceived exertion (RPE) scale  Yes       Intervention  Provide education and explanation on how to use RPE scale       Expected Outcomes  Short Term: Able to use RPE  daily in rehab to express subjective intensity level;Long Term:  Able to use RPE to guide intensity level when exercising independently       Knowledge and understanding of Target Heart Rate Range (THRR)  Yes       Intervention  Provide education and explanation of THRR including how the numbers were predicted and where they are located for reference       Expected Outcomes  Short Term: Able to state/look up THRR;Long Term: Able to use THRR to govern intensity when exercising independently;Short Term: Able to use daily as guideline for intensity in rehab       Able to check pulse independently  Yes       Intervention  Provide education and demonstration on how to  check pulse in carotid and radial arteries.;Review the importance of being able to check your own pulse for safety during independent exercise       Expected Outcomes  Short Term: Able to explain why pulse checking is important during independent exercise;Long Term: Able to check pulse independently and accurately       Understanding of Exercise Prescription  Yes       Intervention  Provide education, explanation, and written materials on patient's individual exercise prescription       Expected Outcomes  Short Term: Able to explain program exercise prescription;Long Term: Able to explain home exercise prescription to exercise independently          Exercise Goals Re-Evaluation: Exercise Goals Re-Evaluation    Row Name 06/07/19 1531 06/24/19 1517 07/21/19 1330 07/28/19 1507       Exercise Goal Re-Evaluation   Exercise Goals Review  Increase Physical Activity;Increase Strength and Stamina;Able to understand and use rate of perceived exertion (RPE) scale;Knowledge and understanding of Target Heart Rate Range (THRR);Understanding of Exercise Prescription  Increase Physical Activity;Increase Strength and Stamina;Able to understand and use rate of perceived exertion (RPE) scale;Knowledge and understanding of Target Heart Rate Range (THRR);Able to check pulse independently;Understanding of Exercise Prescription  Increase Physical Activity;Increase Strength and Stamina;Able to understand and use rate of perceived exertion (RPE) scale;Knowledge and understanding of Target Heart Rate Range (THRR);Able to check pulse independently;Understanding of Exercise Prescription  Increase Physical Activity;Increase Strength and Stamina;Able to understand and use rate of perceived exertion (RPE) scale;Knowledge and understanding of Target Heart Rate Range (THRR);Able to check pulse independently;Understanding of Exercise Prescription    Comments  Pt first day of CR program. Pt tolerated exercise Rx well. Pt understands  THRR, exercise Rx, and RPE scale.  Pt is tolerating exercise Rx well and progressing well in CR program. Pt MET level and workloads are increasing. MET level is 3.3. Pt is continuing to exercise at home in addition to CR program.  Reviewed METs and goals with Pt. Pt is progressing well and has a MET level of 4.0. Pt continues to increase workloads and MET levels. Pt is currently walking for exercise 2-4 days per week for 30-45 minutes per day in addition to CR program.  Pt graduated CR program today. Pt progressed well and has a MET level of 4.2. Pt increased workloads and MET level gradually. 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 Encouraged Pt to get at least 150 minutes of exercise in each week.    Expected Outcomes  Will continue to monitor and progress Pt as tolerated.  Will continue to monitor and progress Pt as tolerated.  Will  continue to monitor and progress Pt as tolerated.  Pt will continue exercise at home.       Nutrition & Weight - Outcomes: Pre Biometrics - 06/03/19 1041      Pre Biometrics   Height  6' 2" (1.88 m)    Weight  105.7 kg    Waist Circumference  43.5 inches    Hip Circumference  44.5 inches    Waist to Hip Ratio  0.98 %    BMI (Calculated)  29.91    Triceps Skinfold  30 mm    % Body Fat  32.2 %    Grip Strength  48 kg    Flexibility  15 in    Single Leg Stand  15.43 seconds      Post Biometrics - 07/23/19 1540       Post  Biometrics   Height  6' 2" (1.88 m)    Weight  107.5 kg    Waist Circumference  42 inches    Hip Circumference  44.5 inches    Waist to Hip Ratio  0.94 %    BMI (Calculated)  30.42    Triceps Skinfold  25 mm    % Body Fat  30.9 %    Grip Strength  42 kg    Flexibility  11 in    Single Leg Stand  21.31 seconds       Nutrition:   Nutrition Discharge:   Education Questionnaire Score: Knowledge Questionnaire Score - 07/23/19 1528      Knowledge  Questionnaire Score   Post Score  22/24       Pt graduated from cardiac rehab program today with completion of 16 exercise sessions in Phase II. Pt maintained good attendance and progressed nicely during his participation in rehab as evidenced by increased MET level.   Medication list reconciled. Repeat  PHQ score-0. Pt has made significant lifestyle changes and should be commended for his success. Pt feels he has achieved his goals during cardiac rehab.    Goals reviewed with patient; copy given to patient.

## 2019-07-30 ENCOUNTER — Encounter (HOSPITAL_COMMUNITY): Payer: Medicare Other

## 2019-08-09 ENCOUNTER — Ambulatory Visit (INDEPENDENT_AMBULATORY_CARE_PROVIDER_SITE_OTHER): Payer: Medicare Other | Admitting: *Deleted

## 2019-08-09 DIAGNOSIS — I639 Cerebral infarction, unspecified: Secondary | ICD-10-CM

## 2019-08-10 ENCOUNTER — Telehealth: Payer: Self-pay | Admitting: Emergency Medicine

## 2019-08-10 LAB — CUP PACEART REMOTE DEVICE CHECK
Date Time Interrogation Session: 20201102214635
Implantable Pulse Generator Implant Date: 20190424

## 2019-08-10 NOTE — Telephone Encounter (Addendum)
LMOM to contact device clinic. Assess for symptoms due to pauses recorded by loop recorder on 07/28/19 at 0752 which lasted over 4 seconds. Hx of asymptomatic pauses while sleeping.

## 2019-08-10 NOTE — Telephone Encounter (Signed)
Patient reports that he was asleep when pauses occurred on 07/28/19 and has had no symptoms.

## 2019-08-12 DIAGNOSIS — K219 Gastro-esophageal reflux disease without esophagitis: Secondary | ICD-10-CM | POA: Diagnosis not present

## 2019-08-12 DIAGNOSIS — J301 Allergic rhinitis due to pollen: Secondary | ICD-10-CM | POA: Diagnosis not present

## 2019-08-12 DIAGNOSIS — J3081 Allergic rhinitis due to animal (cat) (dog) hair and dander: Secondary | ICD-10-CM | POA: Diagnosis not present

## 2019-08-12 DIAGNOSIS — J3089 Other allergic rhinitis: Secondary | ICD-10-CM | POA: Diagnosis not present

## 2019-09-06 NOTE — Progress Notes (Signed)
Carelink Summary Report / Loop Recorder 

## 2019-09-13 ENCOUNTER — Ambulatory Visit (INDEPENDENT_AMBULATORY_CARE_PROVIDER_SITE_OTHER): Payer: Medicare Other | Admitting: *Deleted

## 2019-09-13 DIAGNOSIS — I63412 Cerebral infarction due to embolism of left middle cerebral artery: Secondary | ICD-10-CM | POA: Diagnosis not present

## 2019-09-13 LAB — CUP PACEART REMOTE DEVICE CHECK
Date Time Interrogation Session: 20201207144823
Implantable Pulse Generator Implant Date: 20190424

## 2019-10-10 NOTE — Progress Notes (Signed)
ILR remote 

## 2019-10-15 ENCOUNTER — Ambulatory Visit (INDEPENDENT_AMBULATORY_CARE_PROVIDER_SITE_OTHER): Payer: Medicare Other | Admitting: *Deleted

## 2019-10-15 ENCOUNTER — Telehealth: Payer: Self-pay | Admitting: Nurse Practitioner

## 2019-10-15 DIAGNOSIS — I63412 Cerebral infarction due to embolism of left middle cerebral artery: Secondary | ICD-10-CM | POA: Diagnosis not present

## 2019-10-15 NOTE — Telephone Encounter (Signed)
Charles Mccoy, Charles Cheng, MD  [name], Lanice Schwab, RN; [name], MD  OK to change the Brilnita to Plavix 75 mg a day  He should take a loading dose of 300 mg Plavix on his first day   I have never actually seen him as a patient ( performed a TEE in April 2019)  He has been seen by Dr. Angelena Form and probably should follow up with him for future visits.   [name], MD   Called patient to ask about how much of the Brilinta he has left. I spoke with patient's wife, [name], who states patient has about 3 weeks of Brilinta left. He paid approximately $400 last month but their insurance changed in January and she is not certain of the new cost. I explained about changing to Plavix and she states she would like to hear from Dr. Angelena Form before they change because she wants the best medicine for her husband. I advised that I will forward message to Dr. Angelena Form and his nurse, Caren Hazy for advice. Patient is due for f/u in February per Pecolia Ades, NP. Wife is aware they will receive a call next week to discuss. She thanked me for the call.

## 2019-10-17 LAB — CUP PACEART REMOTE DEVICE CHECK
Date Time Interrogation Session: 20210109134042
Implantable Pulse Generator Implant Date: 20190424

## 2019-10-17 NOTE — Progress Notes (Signed)
ILR remote 

## 2019-10-18 NOTE — Telephone Encounter (Signed)
Agree that he can switch to plavix. I will be glad to follow him. Gerald Stabs

## 2019-10-18 NOTE — Telephone Encounter (Signed)
I called and spoke with patient's wife. Pt is out of town. They are going to discuss whether or not he is going to switch to Plavix and will call back or wait until next visit with Dr. Angelena Form and decide.  Pt is due for follow up in Feb.  I have scheduled him 01/07/20.  Pt's wife is aware that I will call her if sooner appointment opens.

## 2019-11-03 ENCOUNTER — Ambulatory Visit: Payer: Medicare Other | Attending: Internal Medicine

## 2019-11-03 DIAGNOSIS — Z20822 Contact with and (suspected) exposure to covid-19: Secondary | ICD-10-CM

## 2019-11-04 LAB — NOVEL CORONAVIRUS, NAA: SARS-CoV-2, NAA: NOT DETECTED

## 2019-11-08 ENCOUNTER — Other Ambulatory Visit: Payer: Self-pay

## 2019-11-08 ENCOUNTER — Encounter: Payer: Self-pay | Admitting: Cardiovascular Disease

## 2019-11-08 ENCOUNTER — Other Ambulatory Visit: Payer: Self-pay | Admitting: Internal Medicine

## 2019-11-08 ENCOUNTER — Ambulatory Visit (INDEPENDENT_AMBULATORY_CARE_PROVIDER_SITE_OTHER): Payer: Medicare Other | Admitting: Cardiovascular Disease

## 2019-11-08 VITALS — BP 120/76 | HR 59 | Ht 76.0 in | Wt 241.0 lb

## 2019-11-08 DIAGNOSIS — E785 Hyperlipidemia, unspecified: Secondary | ICD-10-CM | POA: Diagnosis not present

## 2019-11-08 DIAGNOSIS — I63412 Cerebral infarction due to embolism of left middle cerebral artery: Secondary | ICD-10-CM

## 2019-11-08 DIAGNOSIS — I2510 Atherosclerotic heart disease of native coronary artery without angina pectoris: Secondary | ICD-10-CM

## 2019-11-08 DIAGNOSIS — I10 Essential (primary) hypertension: Secondary | ICD-10-CM | POA: Diagnosis not present

## 2019-11-08 MED ORDER — CLOPIDOGREL BISULFATE 75 MG PO TABS: 75.0000 mg | ORAL_TABLET | Freq: Every day | ORAL | 3 refills | Status: AC

## 2019-11-08 NOTE — Patient Instructions (Signed)
Medication Instructions:  Your physician has recommended you make the following change in your medication:  START Clopidogrel (Plavix) 75 mg when you run out of your Brilinta  *If you need a refill on your cardiac medications before your next appointment, please call your pharmacy*  Lab Work: None Ordered If you have labs (blood work) drawn today and your tests are completely normal, you will receive your results only by: Marland Kitchen MyChart Message (if you have MyChart) OR . A paper copy in the mail If you have any lab test that is abnormal or we need to change your treatment, we will call you to review the results.   Testing/Procedures: None Ordered   Follow-Up: At [facility], you and your health needs are our priority.  As part of our continuing mission to provide you with exceptional heart care, we have created designated Provider Care Teams.  These Care Teams include your primary Cardiologist (physician) and Advanced Practice Providers (APPs -  Physician Assistants and Nurse Practitioners) who all work together to provide you with the care you need, when you need it.  Your next appointment:   6 month(s)  The format for your next appointment:   Either In Person or Virtual  Provider:   You may see Lauree Chandler, MD or one of the following Advanced Practice Providers on your designated Care Team:    Melina Copa, PA-C  Ermalinda Barrios, PA-C

## 2019-11-08 NOTE — Progress Notes (Signed)
Chief Complaint  Patient presents with  . Follow-up    CAD    History of Present Illness: 69 yo male with history of CAD, COPD, GERD, HTN, hyperlipidemia, prior tobacco use and prior stroke here today for cardiac follow up. He was admitted to [facility] July 2020 with a NSTEMI. Cardiac cath July 2020 with a severe LAD stenosis treated with a DES. There was moderate disease in the Circumflex. Echo July 2020 with LVEF=65%, no significant valve disease. He was discharged on ASA and Brilinta. He did not tolerate beta blockers due to nocturnal bradycardia. He has a loop recorder in place given prior stroke.   He is here today for follow up. The patient denies any chest pain, dyspnea, palpitations, lower extremity edema, orthopnea, PND, dizziness, near syncope or syncope.   Primary Care Physician: [name], [name], MD   Past Medical History:  Diagnosis Date  . ALLERGIC RHINITIS 06/19/2007  . ASTHMA 08/17/2008  . Back pain 05/2016  . CAD (coronary artery disease)    05/03/19- NSTEMI, 05/04/19 DES to LAD  . COPD (chronic obstructive pulmonary disease) ([place])   . GERD (gastroesophageal reflux disease)   . Hyperlipidemia   . HYPERTENSION 06/19/2007  . OSTEOARTHRITIS 10/09/2009   wrists, knee.  . Pneumonia    hx of   . SINUSITIS 09/15/2007   recent issuses 2 weeks ago-clearing.  . Stroke [facility])     Past Surgical History:  Procedure Laterality Date  . CARDIAC CATHETERIZATION    . CORONARY STENT INTERVENTION N/A 05/04/2019   Procedure: CORONARY STENT INTERVENTION;  Surgeon: Leonie Man, MD;  Location: [place] [facility];  Service: Cardiovascular;  Laterality: N/A;  . cyst removed from neck      age 28   . KNEE ARTHROSCOPY     2 on left knee , 1 on right  . LEFT HEART CATH AND CORONARY ANGIOGRAPHY N/A 05/04/2019   Procedure: LEFT HEART CATH AND CORONARY ANGIOGRAPHY;  Surgeon: Leonie Man, MD;  Location: [place] [facility];  Service: Cardiovascular;  Laterality: N/A;  . LOOP  RECORDER INSERTION N/A 01/28/2018   Procedure: LOOP RECORDER INSERTION;  Surgeon: Evans Lance, MD;  Location: [place] [facility];  Service: Cardiovascular;  Laterality: N/A;  . TEE WITHOUT CARDIOVERSION N/A 01/28/2018   Procedure: TRANSESOPHAGEAL ECHOCARDIOGRAM (TEE);  Surgeon: Acie Fredrickson Wonda Cheng, MD;  Location: [facility];  Service: Cardiovascular;  Laterality: N/A;  . TOTAL KNEE ARTHROPLASTY Left 07/21/2014   Procedure: LEFT TOTAL KNEE ARTHROPLASTY;  Surgeon: Johnn Hai, MD;  Location: [facility];  Service: Orthopedics;  Laterality: Left;  . TOTAL KNEE ARTHROPLASTY Right 05/30/2016   Procedure: RIGHT TOTAL KNEE ARTHROPLASTY REPLACEMENT;  Surgeon: Susa Day, MD;  Location: [facility];  Service: Orthopedics;  Laterality: Right;    Current Outpatient Medications  Medication Sig Dispense Refill  . albuterol (VENTOLIN HFA) 108 (90 Base) MCG/ACT inhaler Inhale 1 puff into the lungs as needed. Use as directed    . amLODipine (NORVASC) 10 MG tablet TAKE 1 TABLET BY MOUTH EVERY DAY IN THE MORNING 90 tablet 1  . aspirin 81 MG EC tablet Take 1 tablet (81 mg total) by mouth daily. 90 tablet 3  . atorvastatin (LIPITOR) 80 MG tablet Take 1 tablet (80 mg total) by mouth daily at 6 PM. 90 tablet 1  . azelastine (ASTELIN) 0.1 % nasal spray Place 1 spray into both nostrils as needed for allergies.   5  . fexofenadine (ALLEGRA) 60 MG tablet Take  60 mg by mouth as needed for allergies.     . Fluticasone-Salmeterol 232-14 MCG/ACT AEPB Inhale 1 puff into the lungs 2 (two) times daily.    . montelukast (SINGULAIR) 10 MG tablet Take 10 mg by mouth every morning.    . nitroGLYCERIN (NITROSTAT) 0.4 MG SL tablet Place 1 tablet (0.4 mg total) under the tongue every 5 (five) minutes as needed for chest pain. 25 tablet 0  . pantoprazole (PROTONIX) 40 MG tablet Take 1 tablet (40 mg total) by mouth daily. 90 tablet 1  . spironolactone (ALDACTONE) 25 MG tablet Take 1 tablet (25 mg total) by mouth daily. 90 tablet 1  .  clopidogrel (PLAVIX) 75 MG tablet Take 1 tablet (75 mg total) by mouth daily. 90 tablet 3   No current facility-administered medications for this visit.    Allergies  Allergen Reactions  . Erythromycin Base Other (See Comments)    REACTION: stomach irritation, patient is unaware of this allergy    Social History   Socioeconomic History  . Marital status: Married    Spouse name: Not on file  . Number of children: Not on file  . Years of education: Not on file  . Highest education level: High school graduate  Occupational History  . Not on file  Tobacco Use  . Smoking status: Former Smoker    Types: Cigarettes    Quit date: 10/07/2001    Years since quitting: 18.0  . Smokeless tobacco: Never Used  Substance and Sexual Activity  . Alcohol use: Yes    Alcohol/week: 6.0 standard drinks    Types: 6 Cans of beer per week    Comment: beer daily  . Drug use: No  . Sexual activity: Yes  Other Topics Concern  . Not on file  Social History Narrative  . Not on file   Social Determinants of Health   Financial Resource Strain: Low Risk   . Difficulty of Paying Living Expenses: Not hard at all  Food Insecurity: No Food Insecurity  . Worried About Charity fundraiser in the Last Year: Never true  . Ran Out of Food in the Last Year: Never true  Transportation Needs: No Transportation Needs  . Lack of Transportation (Medical): No  . Lack of Transportation (Non-Medical): No  Physical Activity: Sufficiently Active  . Days of Exercise per Week: 5 days  . Minutes of Exercise per Session: 30 min  Stress: No Stress Concern Present  . Feeling of Stress : Only a little  Social Connections:   . Frequency of Communication with Friends and Family: Not on file  . Frequency of Social Gatherings with Friends and Family: Not on file  . Attends Religious Services: Not on file  . Active Member of Clubs or Organizations: Not on file  . Attends Archivist Meetings: Not on file  .  Marital Status: Not on file  Intimate Partner Violence:   . Fear of Current or Ex-Partner: Not on file  . Emotionally Abused: Not on file  . Physically Abused: Not on file  . Sexually Abused: Not on file    Family History  Problem Relation Age of Onset  . Colon cancer Neg Hx   . Esophageal cancer Neg Hx   . Rectal cancer Neg Hx   . Stomach cancer Neg Hx     Review of Systems:  As stated in the HPI and otherwise negative.   BP 120/76   Pulse (!) 59   Ht 6\' 4"  (  1.93 m)   Wt 241 lb (109.3 kg)   SpO2 96%   BMI 29.34 kg/m   Physical Examination: General: Well developed, well nourished, NAD  HEENT: OP clear, mucus membranes moist  SKIN: warm, dry. No rashes. Neuro: No focal deficits  Musculoskeletal: Muscle strength 5/5 all ext  Psychiatric: Mood and affect normal  Neck: No JVD, no carotid bruits, no thyromegaly, no lymphadenopathy.  Lungs:Clear bilaterally, no wheezes, rhonci, crackles Cardiovascular: Regular rate and rhythm. No murmurs, gallops or rubs. Abdomen:Soft. Bowel sounds present. Non-tender.  Extremities: No lower extremity edema. Pulses are 2 + in the bilateral DP/PT.  EKG:  EKG is not ordered today. The ekg ordered today demonstrates   Recent Labs: 05/05/2019: BUN 9; Creatinine, Ser 0.87; Hemoglobin 13.7; Platelets 240; Potassium 3.7; Sodium 134 06/28/2019: ALT 18   Lipid Panel    Component Value Date/Time   CHOL 106 06/28/2019 0833   TRIG 58 06/28/2019 0833   HDL 36 (L) 06/28/2019 0833   CHOLHDL 2.9 06/28/2019 0833   CHOLHDL 3.8 05/04/2019 0119   VLDL 19 05/04/2019 0119   LDLCALC 57 06/28/2019 0833   LDLDIRECT 145.8 08/17/2012 1027     Wt Readings from Last 3 Encounters:  11/08/19 241 lb (109.3 kg)  07/23/19 236 lb 15.9 oz (107.5 kg)  06/03/19 233 lb 0.4 oz (105.7 kg)    Assessment and Plan:   1. CAD without angina: No chest pain. Will continue ASA and statin. Will change Brilinta to Plavix due to cost.   2. HTN: BP controlled. No changes   3. Hyperlipidemia: LDL at goal. Continue statin  Current medicines are reviewed at length with the patient today.  The patient does not have concerns regarding medicines.  The following changes have been made:  no change  Labs/ tests ordered today include:  No orders of the defined types were placed in this encounter.    Disposition:   FU with me in 6 months.    Signed, Lauree Chandler, MD 11/08/2019 3:53 PM    [facility] [address], [place], [place]  [address] Phone: [phone]; Fax: [phone]

## 2019-11-15 ENCOUNTER — Ambulatory Visit (INDEPENDENT_AMBULATORY_CARE_PROVIDER_SITE_OTHER): Payer: Medicare Other | Admitting: *Deleted

## 2019-11-15 DIAGNOSIS — I63412 Cerebral infarction due to embolism of left middle cerebral artery: Secondary | ICD-10-CM | POA: Diagnosis not present

## 2019-11-15 LAB — CUP PACEART REMOTE DEVICE CHECK
Date Time Interrogation Session: 20210208000320
Implantable Pulse Generator Implant Date: 20190424

## 2019-11-16 NOTE — Progress Notes (Signed)
ILR Remote 

## 2019-11-21 ENCOUNTER — Ambulatory Visit: Payer: Medicare Other | Attending: Internal Medicine

## 2019-11-21 DIAGNOSIS — Z23 Encounter for immunization: Secondary | ICD-10-CM | POA: Insufficient documentation

## 2019-11-21 NOTE — Progress Notes (Signed)
   Covid-19 Vaccination Clinic  Name:  Charles Mccoy    MRN: [id number] DOB: October 01, 1951  11/21/2019  [name] was observed post Covid-19 immunization for 30 minutes based on pre-vaccination screening without incidence. He was provided with Vaccine Information Sheet and instruction to access the V-Safe system.   [name] was instructed to call 911 with any severe reactions post vaccine: Marland Kitchen Difficulty breathing  . Swelling of your face and throat  . A fast heartbeat  . A bad rash all over your body  . Dizziness and weakness    Immunizations Administered    Name Date Dose VIS Date Route   Pfizer COVID-19 Vaccine 11/21/2019  3:22 PM 0.3 mL 09/17/2019 Intramuscular   Manufacturer: [place]   Lot: [id number]   [place]: [id number]

## 2019-11-30 ENCOUNTER — Other Ambulatory Visit: Payer: Self-pay | Admitting: Internal Medicine

## 2019-11-30 ENCOUNTER — Other Ambulatory Visit: Payer: Self-pay | Admitting: Medical

## 2019-12-15 ENCOUNTER — Ambulatory Visit: Payer: Medicare Other

## 2019-12-16 ENCOUNTER — Ambulatory Visit (INDEPENDENT_AMBULATORY_CARE_PROVIDER_SITE_OTHER): Payer: Medicare Other | Admitting: *Deleted

## 2019-12-16 DIAGNOSIS — I63412 Cerebral infarction due to embolism of left middle cerebral artery: Secondary | ICD-10-CM | POA: Diagnosis not present

## 2019-12-16 LAB — CUP PACEART REMOTE DEVICE CHECK
Date Time Interrogation Session: 20210311002814
Implantable Pulse Generator Implant Date: 20190424

## 2019-12-17 NOTE — Progress Notes (Signed)
ILR Remote 

## 2019-12-18 ENCOUNTER — Ambulatory Visit: Payer: Medicare Other | Attending: Internal Medicine

## 2019-12-18 DIAGNOSIS — Z23 Encounter for immunization: Secondary | ICD-10-CM

## 2019-12-18 NOTE — Progress Notes (Signed)
   Covid-19 Vaccination Clinic  Name:  Charles Mccoy    MRN: [id number] DOB: 1951/05/24  12/18/2019  [name] was observed post Covid-19 immunization for 15 minutes without incident. He was provided with Vaccine Information Sheet and instruction to access the V-Safe system.   [name] was instructed to call 911 with any severe reactions post vaccine: Marland Kitchen Difficulty breathing  . Swelling of face and throat  . A fast heartbeat  . A bad rash all over body  . Dizziness and weakness   Immunizations Administered    Name Date Dose VIS Date Route   Pfizer COVID-19 Vaccine 12/18/2019 10:31 AM 0.3 mL 09/17/2019 Intramuscular   Manufacturer: [place]   Lot: [id number]   [place]: [id number]

## 2019-12-20 ENCOUNTER — Ambulatory Visit: Payer: Medicare Other

## 2020-01-07 ENCOUNTER — Ambulatory Visit: Payer: Medicare Other | Admitting: Cardiovascular Disease

## 2020-01-17 ENCOUNTER — Ambulatory Visit (INDEPENDENT_AMBULATORY_CARE_PROVIDER_SITE_OTHER): Payer: Medicare Other | Admitting: *Deleted

## 2020-01-17 DIAGNOSIS — I63412 Cerebral infarction due to embolism of left middle cerebral artery: Secondary | ICD-10-CM | POA: Diagnosis not present

## 2020-01-17 LAB — CUP PACEART REMOTE DEVICE CHECK
Date Time Interrogation Session: 20210411033645
Implantable Pulse Generator Implant Date: 20190424

## 2020-01-18 NOTE — Progress Notes (Signed)
ILR Remote 

## 2020-02-04 DIAGNOSIS — I2510 Atherosclerotic heart disease of native coronary artery without angina pectoris: Secondary | ICD-10-CM

## 2020-02-07 MED ORDER — ATORVASTATIN CALCIUM 40 MG PO TABS: 40.0000 mg | ORAL_TABLET | Freq: Every day | ORAL | 3 refills | Status: DC

## 2020-02-07 NOTE — Telephone Encounter (Signed)
Medication list updated. Pt scheduled back for Aug.  Will check lipids at that appointment.

## 2020-02-20 LAB — CUP PACEART REMOTE DEVICE CHECK
Date Time Interrogation Session: 20210512033504
Implantable Pulse Generator Implant Date: 20190424

## 2020-02-21 ENCOUNTER — Ambulatory Visit (INDEPENDENT_AMBULATORY_CARE_PROVIDER_SITE_OTHER): Payer: Medicare Other | Admitting: *Deleted

## 2020-02-21 DIAGNOSIS — I639 Cerebral infarction, unspecified: Secondary | ICD-10-CM | POA: Diagnosis not present

## 2020-02-22 NOTE — Progress Notes (Signed)
Carelink Summary Report / Loop Recorder 

## 2020-02-28 DIAGNOSIS — R072 Precordial pain: Secondary | ICD-10-CM | POA: Diagnosis not present

## 2020-03-01 ENCOUNTER — Other Ambulatory Visit: Payer: Self-pay

## 2020-03-01 ENCOUNTER — Emergency Department (HOSPITAL_COMMUNITY): Payer: Medicare Other

## 2020-03-01 ENCOUNTER — Emergency Department (HOSPITAL_COMMUNITY)
Admission: EM | Admit: 2020-03-01 | Discharge: 2020-03-01 | Disposition: A | Discharge status: Home / Self Care | Payer: Medicare Other | Attending: Emergency Medicine | Admitting: Emergency Medicine

## 2020-03-01 ENCOUNTER — Encounter (HOSPITAL_COMMUNITY): Payer: Self-pay

## 2020-03-01 DIAGNOSIS — R160 Hepatomegaly, not elsewhere classified: Secondary | ICD-10-CM | POA: Insufficient documentation

## 2020-03-01 DIAGNOSIS — I252 Old myocardial infarction: Secondary | ICD-10-CM | POA: Insufficient documentation

## 2020-03-01 DIAGNOSIS — R0602 Shortness of breath: Secondary | ICD-10-CM | POA: Diagnosis not present

## 2020-03-01 DIAGNOSIS — K5730 Diverticulosis of large intestine without perforation or abscess without bleeding: Secondary | ICD-10-CM | POA: Diagnosis not present

## 2020-03-01 DIAGNOSIS — Z7901 Long term (current) use of anticoagulants: Secondary | ICD-10-CM | POA: Diagnosis not present

## 2020-03-01 DIAGNOSIS — R079 Chest pain, unspecified: Secondary | ICD-10-CM | POA: Diagnosis not present

## 2020-03-01 DIAGNOSIS — Z79899 Other long term (current) drug therapy: Secondary | ICD-10-CM | POA: Insufficient documentation

## 2020-03-01 DIAGNOSIS — I2510 Atherosclerotic heart disease of native coronary artery without angina pectoris: Secondary | ICD-10-CM | POA: Insufficient documentation

## 2020-03-01 DIAGNOSIS — Z8673 Personal history of transient ischemic attack (TIA), and cerebral infarction without residual deficits: Secondary | ICD-10-CM | POA: Diagnosis not present

## 2020-03-01 DIAGNOSIS — I10 Essential (primary) hypertension: Secondary | ICD-10-CM | POA: Diagnosis not present

## 2020-03-01 DIAGNOSIS — R0789 Other chest pain: Secondary | ICD-10-CM | POA: Diagnosis not present

## 2020-03-01 DIAGNOSIS — Z87891 Personal history of nicotine dependence: Secondary | ICD-10-CM | POA: Diagnosis not present

## 2020-03-01 DIAGNOSIS — K7689 Other specified diseases of liver: Secondary | ICD-10-CM | POA: Diagnosis not present

## 2020-03-01 DIAGNOSIS — Z96653 Presence of artificial knee joint, bilateral: Secondary | ICD-10-CM | POA: Diagnosis not present

## 2020-03-01 LAB — D-DIMER, QUANTITATIVE: D-Dimer, Quant: 3.17 ug/mL-FEU — ABNORMAL HIGH (ref 0.00–0.50)

## 2020-03-01 LAB — CBC WITH DIFFERENTIAL/PLATELET
Abs Immature Granulocytes: 0.02 10*3/uL (ref 0.00–0.07)
Basophils Absolute: 0.1 10*3/uL (ref 0.0–0.1)
Basophils Relative: 1 %
Eosinophils Absolute: 0.1 10*3/uL (ref 0.0–0.5)
Eosinophils Relative: 1 %
HCT: 41.8 % (ref 39.0–52.0)
Hemoglobin: 13.8 g/dL (ref 13.0–17.0)
Immature Granulocytes: 0 %
Lymphocytes Relative: 11 %
Lymphs Abs: 1 10*3/uL (ref 0.7–4.0)
MCH: 27.4 pg (ref 26.0–34.0)
MCHC: 33 g/dL (ref 30.0–36.0)
MCV: 83.1 fL (ref 80.0–100.0)
Monocytes Absolute: 1.1 10*3/uL — ABNORMAL HIGH (ref 0.1–1.0)
Monocytes Relative: 12 %
Neutro Abs: 6.6 10*3/uL (ref 1.7–7.7)
Neutrophils Relative %: 75 %
Platelets: 296 10*3/uL (ref 150–400)
RBC: 5.03 MIL/uL (ref 4.22–5.81)
RDW: 13.4 % (ref 11.5–15.5)
WBC: 8.9 10*3/uL (ref 4.0–10.5)
nRBC: 0 % (ref 0.0–0.2)

## 2020-03-01 LAB — COMPREHENSIVE METABOLIC PANEL
ALT: 48 U/L — ABNORMAL HIGH (ref 0–44)
AST: 39 U/L (ref 15–41)
Albumin: 3.3 g/dL — ABNORMAL LOW (ref 3.5–5.0)
Alkaline Phosphatase: 134 U/L — ABNORMAL HIGH (ref 38–126)
Anion gap: 10 (ref 5–15)
BUN: 12 mg/dL (ref 8–23)
CO2: 26 mmol/L (ref 22–32)
Calcium: 8.8 mg/dL — ABNORMAL LOW (ref 8.9–10.3)
Chloride: 101 mmol/L (ref 98–111)
Creatinine, Ser: 0.86 mg/dL (ref 0.61–1.24)
GFR calc Af Amer: >60 mL/min (ref >60)
GFR calc non Af Amer: >60 mL/min (ref >60)
Glucose, Bld: 100 mg/dL — ABNORMAL HIGH (ref 70–99)
Potassium: 4.4 mmol/L (ref 3.5–5.1)
Sodium: 137 mmol/L (ref 135–145)
Total Bilirubin: 0.8 mg/dL (ref 0.3–1.2)
Total Protein: 6.9 g/dL (ref 6.5–8.1)

## 2020-03-01 MED ORDER — ACETAMINOPHEN 325 MG PO TABS: 650.0000 mg | ORAL_TABLET | Freq: Once | ORAL | Status: DC

## 2020-03-01 MED ORDER — IOHEXOL 300 MG/ML  SOLN
80.0000 mL | Freq: Once | INTRAMUSCULAR | Status: AC | PRN
  Administered 2020-03-01: 80 mL via INTRAVENOUS

## 2020-03-01 MED ORDER — ACETAMINOPHEN 325 MG PO TABS
650.0000 mg | ORAL_TABLET | Freq: Once | ORAL | Status: AC | PRN
  Administered 2020-03-01: 650 mg via ORAL
  Filled 2020-03-01: qty 2

## 2020-03-01 MED ORDER — METHOCARBAMOL 500 MG PO TABS
500.0000 mg | ORAL_TABLET | Freq: Two times a day (BID) | ORAL | 0 refills | Status: DC | PRN
Start: 2020-03-01 — End: 2020-03-16

## 2020-03-01 MED ORDER — IOHEXOL 350 MG/ML SOLN
80.0000 mL | Freq: Once | INTRAVENOUS | Status: AC | PRN
  Administered 2020-03-01: 80 mL via INTRAVENOUS

## 2020-03-01 NOTE — ED Notes (Signed)
Pt transported to CT ?

## 2020-03-01 NOTE — ED Provider Notes (Signed)
ECG SR 64, anterior injury pattern artefact, abnormal ECG   Carmin Muskrat, MD 03/01/20 [id number]

## 2020-03-01 NOTE — ED Notes (Signed)
Discharge instructions reviewed with pt. Pt verbalized understanding.   

## 2020-03-01 NOTE — ED Triage Notes (Signed)
Patient complains of right sided rib pain x 2-3 days, states pain worse with inspiration and movement

## 2020-03-01 NOTE — ED Provider Notes (Signed)
[facility] EMERGENCY DEPARTMENT Provider Note   CSN: [id number] Arrival date & time: 03/01/20  [id number]     History Chief Complaint  Patient presents with  . Chest Pain    Charles Mccoy is a 69 y.o. male with past medical history of NSTEMI, CVA, hypertension, COPD, presenting to the emergency department with 3 days of constant right lower lateral chest pain.  He states it feels somewhat similar to a pulled muscle though it is much more severe than that.  He does not recall a particular injury though does state that he sometimes reaches overhead he is at work.  He has not had any recent heavy lifting.  His pain is worse with deep breathing.  He is feeling more winded than usual with usual activity.  He denies cough, hemoptysis, unilateral leg pain or swelling, abdominal pain, postprandial pain, nausea, vomiting, diarrhea.  No history of PE or DVT.  Takes Plavix daily.  No history of COVID-19 illness.  The history is provided by the patient.       Past Medical History:  Diagnosis Date  . ALLERGIC RHINITIS 06/19/2007  . ASTHMA 08/17/2008  . Back pain 05/2016  . CAD (coronary artery disease)    05/03/19- NSTEMI, 05/04/19 DES to LAD  . COPD (chronic obstructive pulmonary disease) ([place])   . GERD (gastroesophageal reflux disease)   . Hyperlipidemia   . HYPERTENSION 06/19/2007  . OSTEOARTHRITIS 10/09/2009   wrists, knee.  . Pneumonia    hx of   . SINUSITIS 09/15/2007   recent issuses 2 weeks ago-clearing.  . Stroke [facility])     Patient Active Problem List   Diagnosis Date Noted  . CAD (coronary artery disease) 05/05/2019  . NSTEMI (non-ST elevated myocardial infarction) ([place]) 05/03/2019  . Pulmonary nodules/lesions, multiple 04/29/2019  . Right carpal tunnel syndrome 10/15/2018  . Left inguinal hernia 10/15/2018  . Cerebrovascular accident (CVA) ([place])   . Hyperlipidemia 01/20/2018  . BPH (benign prostatic hyperplasia) 01/20/2018  . COPD (chronic obstructive pulmonary  disease) ([place]) 01/20/2018  . Cerebral infarction due to embolism of left middle cerebral artery ([place]) s/p tPA 01/19/2018  . Suspected stroke patient last known to be well 2 to 3 hours ago 01/18/2018  . Primary osteoarthritis of right knee 05/30/2016  . Right knee DJD 05/30/2016  . GERD (gastroesophageal reflux disease) 07/28/2014  . Left knee DJD 07/21/2014  . Osteoarthritis 10/09/2009  . Asthma 08/17/2008  . SINUSITIS 09/15/2007  . Essential hypertension 06/19/2007  . Allergic rhinitis 06/19/2007    Past Surgical History:  Procedure Laterality Date  . CARDIAC CATHETERIZATION    . CORONARY STENT INTERVENTION N/A 05/04/2019   Procedure: CORONARY STENT INTERVENTION;  Surgeon: Leonie Man, MD;  Location: [place] CV LAB;  Service: Cardiovascular;  Laterality: N/A;  . cyst removed from neck      age 62   . KNEE ARTHROSCOPY     2 on left knee , 1 on right  . LEFT HEART CATH AND CORONARY ANGIOGRAPHY N/A 05/04/2019   Procedure: LEFT HEART CATH AND CORONARY ANGIOGRAPHY;  Surgeon: Leonie Man, MD;  Location: [facility];  Service: Cardiovascular;  Laterality: N/A;  . LOOP RECORDER INSERTION N/A 01/28/2018   Procedure: LOOP RECORDER INSERTION;  Surgeon: Evans Lance, MD;  Location: [facility];  Service: Cardiovascular;  Laterality: N/A;  . TEE WITHOUT CARDIOVERSION N/A 01/28/2018   Procedure: TRANSESOPHAGEAL ECHOCARDIOGRAM (TEE);  Surgeon: Thayer Headings, MD;  Location: [facility] ENDOSCOPY;  Service: Cardiovascular;  Laterality: N/A;  . TOTAL KNEE ARTHROPLASTY Left 07/21/2014   Procedure: LEFT TOTAL KNEE ARTHROPLASTY;  Surgeon: Johnn Hai, MD;  Location: [facility] ORS;  Service: Orthopedics;  Laterality: Left;  . TOTAL KNEE ARTHROPLASTY Right 05/30/2016   Procedure: RIGHT TOTAL KNEE ARTHROPLASTY REPLACEMENT;  Surgeon: Susa Day, MD;  Location: [facility] ORS;  Service: Orthopedics;  Laterality: Right;       Family History  Problem Relation Age of Onset  . Colon cancer Neg  Hx   . Esophageal cancer Neg Hx   . Rectal cancer Neg Hx   . Stomach cancer Neg Hx     Social History   Tobacco Use  . Smoking status: Former Smoker    Types: Cigarettes    Quit date: 10/07/2001    Years since quitting: 18.4  . Smokeless tobacco: Never Used  Substance Use Topics  . Alcohol use: Yes    Alcohol/week: 6.0 standard drinks    Types: 6 Cans of beer per week    Comment: beer daily  . Drug use: No    Home Medications Prior to Admission medications   Medication Sig Start Date End Date Taking? Authorizing Provider  albuterol (VENTOLIN HFA) 108 (90 Base) MCG/ACT inhaler Inhale 1 puff into the lungs as needed. Use as directed 05/11/19  Yes [provider]  amLODipine (NORVASC) 10 MG tablet TAKE 1 TABLET BY MOUTH EVERY DAY IN THE MORNING Patient taking differently: Take 10 mg by mouth daily.  12/01/19  Yes [name], [name], MD  aspirin 81 MG EC tablet Take 1 tablet (81 mg total) by mouth daily. 05/17/19  Yes Daune Perch, NP  atorvastatin (LIPITOR) 40 MG tablet Take 1 tablet (40 mg total) by mouth daily. 02/07/20  Yes [name], MD  azelastine (ASTELIN) 0.1 % nasal spray Place 1 spray into both nostrils as needed for allergies.  01/13/18  Yes [provider]  clopidogrel (PLAVIX) 75 MG tablet Take 1 tablet (75 mg total) by mouth daily. 11/08/19  Yes [name], MD  fexofenadine (ALLEGRA) 60 MG tablet Take 60 mg by mouth as needed for allergies.    Yes [provider]  Fluticasone-Salmeterol 232-14 MCG/ACT AEPB Inhale 1 puff into the lungs 2 (two) times daily.   Yes [provider]  montelukast (SINGULAIR) 10 MG tablet Take 10 mg by mouth every morning. 05/12/19  Yes [provider]  nitroGLYCERIN (NITROSTAT) 0.4 MG SL tablet Place 1 tablet (0.4 mg total) under the tongue every 5 (five) minutes as needed for chest pain. 05/05/19  Yes Furth, Cadence H, PA-C  pantoprazole (PROTONIX) 40 MG tablet TAKE 1 TABLET  BY MOUTH EVERY DAY Patient taking differently: Take 40 mg by mouth daily.  11/09/19  Yes [name], [name], MD  spironolactone (ALDACTONE) 25 MG tablet TAKE 1 TABLET BY MOUTH EVERY DAY Patient taking differently: Take 25 mg by mouth daily.  12/01/19  Yes [name], [name], MD  methocarbamol (ROBAXIN) 500 MG tablet Take 1 tablet (500 mg total) by mouth 2 (two) times daily as needed for muscle spasms. 03/01/20   Rakeisha Nyce, [place] N, PA-C    Allergies    Erythromycin base  Review of Systems   Review of Systems  All other systems reviewed and are negative.   Physical Exam Updated Vital Signs BP 137/89 (BP Location: Left Arm)   Pulse 83   Temp 98 F (36.7 C) (Oral)   Resp 20   SpO2 99%  Physical Exam Vitals and nursing note reviewed.  Constitutional:      General: He is not in acute distress.    Appearance: He is well-developed. He is not ill-appearing.  HENT:     Head: Normocephalic and atraumatic.  Eyes:     Conjunctiva/sclera: Conjunctivae normal.  Cardiovascular:     Rate and Rhythm: Normal rate and regular rhythm.  Pulmonary:     Effort: Pulmonary effort is normal. No respiratory distress.     Breath sounds: Normal breath sounds.       Comments: TTP to right lower lateral chest wall, over the inferior-most intercostal spaces. No rash. No crepitus or bruising. Symmetric chest expansion. Chest:     Chest wall: Tenderness present.  Abdominal:     General: Bowel sounds are normal.     Palpations: Abdomen is soft.     Tenderness: There is abdominal tenderness in the right upper quadrant.  Skin:    General: Skin is warm.  Neurological:     Mental Status: He is alert.  Psychiatric:        Behavior: Behavior normal.     ED Results / Procedures / Treatments   Labs (all labs ordered are listed, but only abnormal results are displayed) Labs Reviewed  CBC WITH DIFFERENTIAL/PLATELET - Abnormal; Notable for the following components:      Result Value    Monocytes Absolute 1.1 (*)    All other components within normal limits  COMPREHENSIVE METABOLIC PANEL - Abnormal; Notable for the following components:   Glucose, Bld 100 (*)    Calcium 8.8 (*)    Albumin 3.3 (*)    ALT 48 (*)    Alkaline Phosphatase 134 (*)    All other components within normal limits  D-DIMER, QUANTITATIVE (NOT AT [facility]) - Abnormal; Notable for the following components:   D-Dimer, Quant 3.17 (*)    All other components within normal limits    EKG None  Radiology DG Chest 2 View  Result Date: 03/01/2020 CLINICAL DATA:  Chest pain EXAM: CHEST - 2 VIEW COMPARISON:  May 03, 2019 FINDINGS: There is mild bibasilar atelectasis. Lungs otherwise are clear. The heart size and pulmonary vascularity are normal. No adenopathy. There are foci left anterior descending coronary artery calcification. There is aortic atherosclerosis. There is degenerative change in the thoracic spine. There is a loop recorder on the left anteriorly. IMPRESSION: Bibasilar atelectasis.  Lungs otherwise clear.  Heart size normal. Coronary artery calcification noted. Aortic Atherosclerosis (ICD10-I70.0). Loop recorder on left anteriorly. Electronically Signed   By: Lowella Grip III M.D.   On: 03/01/2020 10:23   CT Angio Chest PE W/Cm &/Or Wo Cm  Result Date: 03/01/2020 CLINICAL DATA:  Shortness of breath. Concern for pulmonary embolism. Positive D-dimer. EXAM: CT ANGIOGRAPHY CHEST WITH CONTRAST TECHNIQUE: Multidetector CT imaging of the chest was performed using the standard protocol during bolus administration of intravenous contrast. Multiplanar CT image reconstructions and MIPs were obtained to evaluate the vascular anatomy. CONTRAST:  59mL OMNIPAQUE IOHEXOL 350 MG/ML SOLN COMPARISON:  None. FINDINGS: Cardiovascular: Contrast injection is sufficient to demonstrate satisfactory opacification of the pulmonary arteries to the segmental level. There is no pulmonary embolus. The main pulmonary artery is  within normal limits for size. There is no CT evidence of acute right heart strain. There are atherosclerotic changes of the visualized thoracic aorta. The thoracic aorta is borderline aneurysmal measuring approximately 4 cm in diameter. Heart size is coronary artery calcifications are noted. Mediastinum/Nodes: --there are mildly enlarged  mediastinal lymph nodes. For example there is a right paratracheal lymph node measuring approximately 1.5 cm (axial series 5, image 52). --No axillary lymphadenopathy. --No supraclavicular lymphadenopathy. --Normal thyroid gland. --The esophagus is unremarkable Lungs/Pleura: There is no pneumothorax. Atelectasis is noted at the lung bases. There is no significant pleural effusion. There is a nodule along the major fissure on the left favored to represent a small lymph node. Upper Abdomen: There are innumerable low-attenuation masses throughout the patient's liver. There are few mildly enlarged lymph nodes in the upper abdomen. Musculoskeletal: No chest wall abnormality. No acute or significant osseous findings. Review of the MIP images confirms the above findings. IMPRESSION: 1. No acute pulmonary embolism. 2. There are innumerable masses in the patient's liver concerning for metastatic disease, currently with an unknown primary. 3. There are mildly enlarged mediastinal lymph nodes which may be reactive or metastatic. Attention on follow-up examinations is recommended. 4. Bibasilar atelectasis is noted. Aortic Atherosclerosis (ICD10-I70.0). Electronically Signed   By: Constance Holster M.D.   On: 03/01/2020 19:03   CT ABDOMEN PELVIS W CONTRAST  Result Date: 03/01/2020 CLINICAL DATA:  Chest CTA with findings suspicious for hepatic metastatic disease. EXAM: CT ABDOMEN AND PELVIS WITH CONTRAST TECHNIQUE: Multidetector CT imaging of the abdomen and pelvis was performed using the standard protocol following bolus administration of intravenous contrast. CONTRAST:  40mL OMNIPAQUE  IOHEXOL 300 MG/ML  SOLN COMPARISON:  Chest CTA earlier today. Abdominopelvic CT 08/17/2018 FINDINGS: Lower chest: Assessed on chest CT earlier today. Right greater than left basilar atelectasis. Pleural thickening without significant effusion. Hepatobiliary: Innumerable low-density lesions scattered throughout the liver parenchyma most consistent with diffuse hepatic metastatic disease. Majority of the lesions are 1-2 cm. Largest lesion in the right hepatic dome measures 2.4 cm. Gallbladder physiologically distended, no calcified stone. No biliary dilatation. Pancreas: No ductal dilatation or inflammation. No evidence of pancreatic mass. Spleen: Normal in size without focal abnormality. Adrenals/Urinary Tract: Mild thickening of the lateral left adrenal gland is unchanged from prior exam. The right adrenal gland is normal. There is no suspicious adrenal nodule. No hydronephrosis. There is symmetric bilateral perinephric edema. There is a 3.3 cm exophytic low-density lesion from the posterior left kidney that likely represents a minimally complex cyst. This is not significantly changed in size from prior exam. Excreted IV contrast within both renal collecting systems which limits assessment for stone. Probable parapelvic cyst in the left kidney. There is excreted IV contrast within the urinary bladder without evidence of bladder wall thickening or focal lesion. Stomach/Bowel: Detailed bowel evaluation is limited in the absence of enteric contrast. Mild wall thickening of the distal esophagus. Unopacified stomach is unremarkable. Normal positioning of the ligament of Treitz. Small duodenal diverticulum. No small bowel inflammation or obstruction. No obvious small bowel mass. Normal appendix. Moderate volume of stool throughout the colon. Diverticulosis from the splenic flexure distally. No diverticulitis. Few areas of nondistention involving the sigmoid colon likely related to peristalsis, no obvious colonic mass.  Vascular/Lymphatic: There is a 12 mm portal caval node, series 3, image 24. Suspected 14 mm retrocrural node adjacent to the distal esophagus, series 3, image 15. clustered prominent nodes adjacent to the celiac axis measuring up to 10 mm, series 3, image 22. 8 mm SMA node, series 3, image 28. There is left periaortic retroperitoneal adenopathy, with nodes measuring up to 13 mm, series 3, image 37. No definite enlarged pelvic lymph nodes. Ectatic infrarenal aorta measuring 2.7 cm. Aortic atherosclerosis. The portal vein is patent. Reproductive: Prostate is  unremarkable. Other: Trace fluid adjacent to the inferior liver tip and edema in the right pericolic gutter. No significant ascites. No omental thickening or nodularity. No free air. Prior left inguinal hernia repair. There is fat in the right inguinal canal. Musculoskeletal: No blastic osseous lesion. No evidence of destructive lytic lesion. Multilevel degenerative change in the spine. No suspicious subcutaneous or abdominal wall lesion. IMPRESSION: 1. Innumerable low-density lesions scattered throughout the liver parenchyma most consistent with diffuse hepatic metastatic disease. 2. Retroperitoneal and upper abdominal adenopathy, suspicious for metastatic disease. 3. Primary malignancy is not definitively characterized, however there is wall thickening of the distal esophagus which raises concern for occult esophageal primary. Recommend further evaluation with endoscopy. 4. Colonic diverticulosis without diverticulitis. Areas of nondistention of the sigmoid colon without dominant colonic mass. 5. Ectatic abdominal aorta at risk for aneurysm development, maximal dimension 2.7 cm. Recommend followup by ultrasound in 5 years. This recommendation follows ACR consensus guidelines: White Paper of the ACR Incidental Findings Committee II on Vascular Findings. J Am Coll Radiol 2013; 10:789-794. Aortic aneurysm NOS (ICD10-I71.9) Aortic Atherosclerosis (ICD10-I70.0).  Electronically Signed   By: Keith Rake M.D.   On: 03/01/2020 21:30    Procedures Procedures (including critical care time)  Medications Ordered in ED Medications  iohexol (OMNIPAQUE) 350 MG/ML injection 80 mL (80 mLs Intravenous Contrast Given 03/01/20 1840)  acetaminophen (TYLENOL) tablet 650 mg (650 mg Oral Given 03/01/20 2129)  iohexol (OMNIPAQUE) 300 MG/ML solution 80 mL (80 mLs Intravenous Contrast Given 03/01/20 2052)    ED Course  I have reviewed the triage vital signs and the nursing notes.  Pertinent labs & imaging results that were available during my care of the patient were reviewed by me and considered in my medical decision making (see chart for details).    MDM Rules/Calculators/A&P                      Patient presenting to the emergency department with right-sided lower chest pain that is pleuritic and worse with movement.  It is reproducible with palpation on exam.  He reports associated dyspnea on exertion.  He also has some tenderness in the right upper quadrant.  Presentation is concerning for PE.  Lab work obtained including states metabolic panel with LFTs and D-dimer.  Patient discussed with and evaluated by Dr. Vanita Panda.  D-dimer is elevated to 3.2.  Proceeded with CTA of the chest to rule out PE.  It is negative for PE however with innumerable masses in the liver concerning for metastatic disease.  Discussed with the patient recommendation for further imaging of the abdomen for better evaluation.  CT of the abdomen was obtained which shows thickening of the distal esophagus is not concerning for occult primary, retroperitoneal and upper abdominal adenopathy, and incidental finding of aortic aneurysm measuring 2.7 cm.  All of these findings are discussed with patient.  He is aware of need for repeat ultrasound of his abdominal aorta in 5 years per current guidelines.  Discussed findings today concerning for possible metastatic disease and importance of follow-up  with his PCP.  Answered all questions to the best of my ability.  Patient verbalized understanding.  Will prescribe muscle relaxer to aid in symptoms.  It is possible that liver masses are irritating the diaphragm causing patient's pain with breathing and movement.  OTC medications also encouraged as needed.  Topical heat.  Return precautions discussed.  Safe for discharge at this time.  CT findings discussed with Dr. Melina Copa.  Discussed results, findings, treatment and follow up. Patient advised of return precautions. Patient verbalized understanding and agreed with plan.  Final Clinical Impression(s) / ED Diagnoses Final diagnoses:  Mass of multiple sites of liver  Right-sided chest pain    Rx / DC Orders ED Discharge Orders         Ordered    methocarbamol (ROBAXIN) 500 MG tablet  2 times daily PRN     03/01/20 2200           Sophia Sperry, [place] N, PA-C 03/01/20 2207    Carmin Muskrat, MD 03/03/20 2344    Carmin Muskrat, MD 03/03/20 [phone]

## 2020-03-01 NOTE — Discharge Instructions (Signed)
It was a pleasure caring for you today. Please call your primary care provider for further evaluation of your CT findings today.  You can take robaxin every 12 hours as needed for pain.

## 2020-03-02 ENCOUNTER — Encounter: Payer: Self-pay | Admitting: Internal Medicine

## 2020-03-03 ENCOUNTER — Telehealth (INDEPENDENT_AMBULATORY_CARE_PROVIDER_SITE_OTHER): Payer: Medicare Other | Admitting: Internal Medicine

## 2020-03-03 DIAGNOSIS — I63412 Cerebral infarction due to embolism of left middle cerebral artery: Secondary | ICD-10-CM | POA: Diagnosis not present

## 2020-03-03 DIAGNOSIS — C787 Secondary malignant neoplasm of liver and intrahepatic bile duct: Secondary | ICD-10-CM

## 2020-03-03 DIAGNOSIS — Z09 Encounter for follow-up examination after completed treatment for conditions other than malignant neoplasm: Secondary | ICD-10-CM

## 2020-03-03 MED ORDER — OXYCODONE HCL 5 MG PO TABS: 5.0000 mg | ORAL_TABLET | ORAL | 0 refills | Status: DC | PRN

## 2020-03-03 NOTE — Progress Notes (Signed)
Virtual Visit via Video Note  I connected with Charles Mccoy on 03/03/20 at  3:30 PM EDT by a video enabled telemedicine application and verified that I am speaking with the correct person using two identifiers.  Location patient: home Location provider: work office Persons participating in the virtual visit: patient, provider  I discussed the limitations of evaluation and management by telemedicine and the availability of in person appointments. The patient expressed understanding and agreed to proceed.   HPI: He has scheduled this visit to discuss some concerns after his ED visit on 5/26.  He tells me that he has been having some choking episodes and he thought he had pulled a muscle in his sternum.  On top of that he had been having what he describes as right lower rib cage pain.  Because of his history of recent MI he decided to go to the hospital for evaluation.  Because of a slightly elevated D-dimer a CT of the chest was done that showed multiple liver lesions of unknown primary.  CT abdomen was added.  He was found to have multiple retroperitoneal and mediastinal lymph nodes in addition to some thickening of the esophagus that may be the primary.  He has been having significant right lower chest/upper abdominal pain that corresponds to his liver area.  He would like to know the next steps in the work-up of this process.   ROS: Constitutional: Denies fever, chills, diaphoresis, appetite change and fatigue.  HEENT: Denies photophobia, eye pain, redness, hearing loss, ear pain, congestion, sore throat, rhinorrhea, sneezing, mouth sores, neck pain, neck stiffness and tinnitus.   Respiratory: Denies SOB, DOE, cough, chest tightness,  and wheezing.   Cardiovascular: Denies chest pain, palpitations and leg swelling.  Gastrointestinal: Denies nausea, vomiting, diarrhea, constipation, blood in stool and abdominal distention.  Genitourinary: Denies dysuria, urgency, frequency, hematuria, flank  pain and difficulty urinating.  Endocrine: Denies: hot or cold intolerance, sweats, changes in hair or nails, polyuria, polydipsia. Musculoskeletal: Denies myalgias, back pain, joint swelling, arthralgias and gait problem.  Skin: Denies pallor, rash and wound.  Neurological: Denies dizziness, seizures, syncope, weakness, light-headedness, numbness and headaches.  Hematological: Denies adenopathy. Easy bruising, personal or family bleeding history  Psychiatric/Behavioral: Denies suicidal ideation, mood changes, confusion, nervousness, sleep disturbance and agitation   Past Medical History:  Diagnosis Date  . ALLERGIC RHINITIS 06/19/2007  . ASTHMA 08/17/2008  . Back pain 05/2016  . CAD (coronary artery disease)    05/03/19- NSTEMI, 05/04/19 DES to LAD  . COPD (chronic obstructive pulmonary disease) ([place])   . GERD (gastroesophageal reflux disease)   . Hyperlipidemia   . HYPERTENSION 06/19/2007  . OSTEOARTHRITIS 10/09/2009   wrists, knee.  . Pneumonia    hx of   . SINUSITIS 09/15/2007   recent issuses 2 weeks ago-clearing.  . Stroke [facility])     Past Surgical History:  Procedure Laterality Date  . CARDIAC CATHETERIZATION    . CORONARY STENT INTERVENTION N/A 05/04/2019   Procedure: CORONARY STENT INTERVENTION;  Surgeon: Leonie Man, MD;  Location: [place] [facility];  Service: Cardiovascular;  Laterality: N/A;  . cyst removed from neck      age 30   . KNEE ARTHROSCOPY     2 on left knee , 1 on right  . LEFT HEART CATH AND CORONARY ANGIOGRAPHY N/A 05/04/2019   Procedure: LEFT HEART CATH AND CORONARY ANGIOGRAPHY;  Surgeon: Leonie Man, MD;  Location: [place] [facility];  Service: Cardiovascular;  Laterality:  N/A;  . LOOP RECORDER INSERTION N/A 01/28/2018   Procedure: LOOP RECORDER INSERTION;  Surgeon: Evans Lance, MD;  Location: [place] [facility];  Service: Cardiovascular;  Laterality: N/A;  . TEE WITHOUT CARDIOVERSION N/A 01/28/2018   Procedure: TRANSESOPHAGEAL ECHOCARDIOGRAM  (TEE);  Surgeon: Acie Fredrickson Wonda Cheng, MD;  Location[facility] ENDOSCOPY;  Service: Cardiovascular;  Laterality: N/A;  . TOTAL KNEE ARTHROPLASTY Left 07/21/2014   Procedure: LEFT TOTAL KNEE ARTHROPLASTY;  Surgeon: Johnn Hai, MD;  Location: [facility] ORS;  Service: Orthopedics;  Laterality: Left;  . TOTAL KNEE ARTHROPLASTY Right 05/30/2016   Procedure: RIGHT TOTAL KNEE ARTHROPLASTY REPLACEMENT;  Surgeon: Susa Day, MD;  Location: [facility];  Service: Orthopedics;  Laterality: Right;    Family History  Problem Relation Age of Onset  . Colon cancer Neg Hx   . Esophageal cancer Neg Hx   . Rectal cancer Neg Hx   . Stomach cancer Neg Hx     SOCIAL HX:   reports that he quit smoking about 18 years ago. His smoking use included cigarettes. He has never used smokeless tobacco. He reports current alcohol use of about 6.0 standard drinks of alcohol per week. He reports that he does not use drugs.   Current Outpatient Medications:  .  albuterol (VENTOLIN HFA) 108 (90 Base) MCG/ACT inhaler, Inhale 1 puff into the lungs as needed. Use as directed, Disp: , Rfl:  .  amLODipine (NORVASC) 10 MG tablet, TAKE 1 TABLET BY MOUTH EVERY DAY IN THE MORNING (Patient taking differently: Take 10 mg by mouth daily. ), Disp: 90 tablet, Rfl: 1 .  aspirin 81 MG EC tablet, Take 1 tablet (81 mg total) by mouth daily., Disp: 90 tablet, Rfl: 3 .  atorvastatin (LIPITOR) 40 MG tablet, Take 1 tablet (40 mg total) by mouth daily., Disp: 90 tablet, Rfl: 3 .  azelastine (ASTELIN) 0.1 % nasal spray, Place 1 spray into both nostrils as needed for allergies. , Disp: , Rfl: 5 .  clopidogrel (PLAVIX) 75 MG tablet, Take 1 tablet (75 mg total) by mouth daily., Disp: 90 tablet, Rfl: 3 .  fexofenadine (ALLEGRA) 60 MG tablet, Take 60 mg by mouth as needed for allergies. , Disp: , Rfl:  .  Fluticasone-Salmeterol 232-14 MCG/ACT AEPB, Inhale 1 puff into the lungs 2 (two) times daily., Disp: , Rfl:  .  methocarbamol (ROBAXIN) 500 MG tablet, Take 1 tablet  (500 mg total) by mouth 2 (two) times daily as needed for muscle spasms., Disp: 20 tablet, Rfl: 0 .  montelukast (SINGULAIR) 10 MG tablet, Take 10 mg by mouth every morning., Disp: , Rfl:  .  nitroGLYCERIN (NITROSTAT) 0.4 MG SL tablet, Place 1 tablet (0.4 mg total) under the tongue every 5 (five) minutes as needed for chest pain., Disp: 25 tablet, Rfl: 0 .  pantoprazole (PROTONIX) 40 MG tablet, TAKE 1 TABLET BY MOUTH EVERY DAY (Patient taking differently: Take 40 mg by mouth daily. ), Disp: 90 tablet, Rfl: 1 .  spironolactone (ALDACTONE) 25 MG tablet, TAKE 1 TABLET BY MOUTH EVERY DAY (Patient taking differently: Take 25 mg by mouth daily. ), Disp: 90 tablet, Rfl: 1 .  oxyCODONE (OXY IR/ROXICODONE) 5 MG immediate release tablet, Take 1 tablet (5 mg total) by mouth every 4 (four) hours as needed for severe pain., Disp: 30 tablet, Rfl: 0  EXAM:   VITALS per patient if applicable: None reported  GENERAL: alert, oriented, appears well and in no acute distress  HEENT: atraumatic, conjunttiva clear, no obvious abnormalities on  inspection of external nose and ears  NECK: normal movements of the head and neck  LUNGS: on inspection no signs of respiratory distress, breathing rate appears normal, no obvious gross increased work of breathing, gasping or wheezing  CV: no obvious cyanosis  MS: moves all visible extremities without noticeable abnormality  PSYCH/NEURO: pleasant and cooperative, no obvious depression or anxiety, speech and thought processing grossly intact  ASSESSMENT AND PLAN:   Hospital discharge follow-up Metastases to the liver [facility])   -Unfortunately, this appears to be metastatic disease to the liver.  Given findings of esophageal thickening on CT, this is possibly the primary. -I will refer him to GI as EGD for biopsies and diagnosis may be the least invasive method to obtain a biopsy, can consider liver biopsy if above is nonconclusive. -I will also place oncology  referral. -Given significant pain that is likely related to liver distention, will prescribe some oxycodone that he can take over the weekend.  PDMP has been reviewed, no red flags.  30 tablets of 5 mg oxycodone to take every 6 hours has been prescribed.    I discussed the assessment and treatment plan with the patient. The patient was provided an opportunity to ask questions and all were answered. The patient agreed with the plan and demonstrated an understanding of the instructions.   The patient was advised to call back or seek an in-person evaluation if the symptoms worsen or if the condition fails to improve as anticipated.    Lelon Frohlich, MD  [facility]

## 2020-03-07 ENCOUNTER — Encounter: Payer: Self-pay | Admitting: Internal Medicine

## 2020-03-09 ENCOUNTER — Telehealth: Payer: Self-pay | Admitting: Nurse Practitioner

## 2020-03-09 NOTE — Telephone Encounter (Signed)
Received a new pt referral from Dr. Deniece Ree for Metastases to the liver. [name] has been cld and scheduled to see Lacie on 6/7 at 1:45pm. Pt aware to arrive 15 minutes early.

## 2020-03-10 NOTE — Progress Notes (Signed)
Spoke with patient regarding upcoming appointment with medical oncology on 6/7 at 1:45.  He has an upcoming appointment with GI Dr. Henrene Pastor on 6/10. I explained my role as nurse navigator and he was given my direct phone number.  He is still having quite a bit of ride side pain and is anxious to meet with oncology to determine the next steps.

## 2020-03-12 NOTE — Progress Notes (Addendum)
[place]  Telephone:(336) [phone] Fax:([phone]) [place] Note   Patient Care Team: [name], [name], MD as PCP - General (Internal Medicine) Evans Lance, MD as PCP - Electrophysiology (Cardiology) Nahser, Wonda Cheng, MD as PCP - Cardiology (Cardiology) Jonnie Finner, RN as Oncology Nurse Navigator Truitt Merle, MD as Consulting Physician (Hematology) [name], NP as Nurse Practitioner (Nurse Practitioner) Date of Service: 03/13/2020   CHIEF COMPLAINTS/PURPOSE OF CONSULTATION:  Abnormal imaging concerning for malignancy, referred by Dr. [name] (PCP)  HISTORY OF PRESENTING ILLNESS:  Charles Mccoy 69 y.o. male with PMH of NSTEMI, CVA, HTN, COPD is here because of recent imaging concerning for metastatic cancer. He switched to plavix in 11/2019 and started having muscle/joint pain, back spasm, and fatigue which was felt to be related to high dose lipitor 80 mg. He had persistent sternal/chest pain despite dose reducing to 40 mg. Due to h/o MI, he went to ED for evaluation on 03/01/20. Work up showed normal CBC, mild transaminitis ALT 48 AST 134 with normal Tbili. Due to positive D-dimer he underwent CTA chest which was negative for PE but showed mild mediastinal adenopathy including an enlarged right paratracheal LN measuring 1.5 cm, as well as innumerable low density masses throughout the liver up to 2.4 cm in the right hepatic dome, overall concerning for diffuse hepatic metastatic disease.  CT abd/Pelvis was added, showing mild wall thickening of the distal esophagus as well as enlarged portal caval, celiac axis and periaortic RP nodes. There is a 3.3 cm exophytic low density lesion from the posterior left kidney that likely represents a minimally complex cyst. No colon, lung, or osseous masses. Although the primary site is unknown, the findings are overall concerning for esophageal primary with nodal and hepatic metastasis. He was  referred to oncology for additional work up and management, he has an appointment with Dr. Henrene Pastor of GI on 03/16/20.  Socially, he is married living in [place] with his wife. He has 2 healthy adult children. He retired from [employer] in May when he got sick. He was working and active before symptoms onset. He is a former smoker mostly in 20-30's and again for a few years before finally quitting in 1998. No more than 1 PPD at peak. He is a social drinker, 1-2 beers daily but non recently, no taste for it. Denies other drug use. He has family history positive for laryngeal cancer in his father, deceased age 65 who was a smoker and drinker. Patient's most recent colonoscopy was 2015 by Dr. Henrene Pastor which showed moderate diverticulosis in left colon but otherwise normal.   Today, he presents with is wife. He is having a "COPD exacerbation in the bronchioles" with cough and exertional dyspnea since leaving the ED on 5/26. Cough now finally improved with mucinex. Has recent low grade fever, no chills. Denies sputum production. Has albuterol PRN. He is able to eat normal diet without dysphagia or odynophagia. He does have mild constipation with oxycodone and bloating. Denies blood in stool. Has lost 10 lbs since 11/2019. Denies n/v/d. Reports RUQ and epigastric pain on deep inspiration, managed with oxycodone he takes occasionally. He is taking "full spectrum cannabis oil" and "1 drop frankensense" per wife.   MEDICAL HISTORY:  Past Medical History:  Diagnosis Date   ALLERGIC RHINITIS 06/19/2007   ASTHMA 08/17/2008   Back pain 05/2016   CAD (coronary artery disease)    05/03/19- NSTEMI, 05/04/19 DES to LAD  COPD (chronic obstructive pulmonary disease) (HCC)    GERD (gastroesophageal reflux disease)    Hyperlipidemia    HYPERTENSION 06/19/2007   OSTEOARTHRITIS 10/09/2009   wrists, knee.   Pneumonia    hx of    SINUSITIS 09/15/2007   recent issuses 2 weeks ago-clearing.   Stroke [facility])      SURGICAL HISTORY: Past Surgical History:  Procedure Laterality Date   CARDIAC CATHETERIZATION     CORONARY STENT INTERVENTION N/A 05/04/2019   Procedure: CORONARY STENT INTERVENTION;  Surgeon: Leonie Man, MD;  Location: [place] CV LAB;  Service: Cardiovascular;  Laterality: N/A;   cyst removed from neck      age 20    KNEE ARTHROSCOPY     2 on left knee , 1 on right   LEFT HEART CATH AND CORONARY ANGIOGRAPHY N/A 05/04/2019   Procedure: LEFT HEART CATH AND CORONARY ANGIOGRAPHY;  Surgeon: Leonie Man, MD;  Location: [place] CV LAB;  Service: Cardiovascular;  Laterality: N/A;   LOOP RECORDER INSERTION N/A 01/28/2018   Procedure: LOOP RECORDER INSERTION;  Surgeon: Evans Lance, MD;  Location: [place] CV LAB;  Service: Cardiovascular;  Laterality: N/A;   TEE WITHOUT CARDIOVERSION N/A 01/28/2018   Procedure: TRANSESOPHAGEAL ECHOCARDIOGRAM (TEE);  Surgeon: Acie Fredrickson Wonda Cheng, MD;  Location: [facility] ENDOSCOPY;  Service: Cardiovascular;  Laterality: N/A;   TOTAL KNEE ARTHROPLASTY Left 07/21/2014   Procedure: LEFT TOTAL KNEE ARTHROPLASTY;  Surgeon: Johnn Hai, MD;  Location: [facility] ORS;  Service: Orthopedics;  Laterality: Left;   TOTAL KNEE ARTHROPLASTY Right 05/30/2016   Procedure: RIGHT TOTAL KNEE ARTHROPLASTY REPLACEMENT;  Surgeon: Susa Day, MD;  Location: [facility] ORS;  Service: Orthopedics;  Laterality: Right;    SOCIAL HISTORY: Social History   Socioeconomic History   Marital status: Married    Spouse name: Not on file   Number of children: Not on file   Years of education: Not on file   Highest education level: High school graduate  Occupational History   Not on file  Tobacco Use   Smoking status: Former Smoker    Types: Cigarettes    Quit date: 10/07/2001    Years since quitting: 18.4   Smokeless tobacco: Never Used  Substance and Sexual Activity   Alcohol use: Yes    Alcohol/week: 6.0 standard drinks    Types: 6 Cans of beer per week     Comment: beer daily   Drug use: No   Sexual activity: Yes  Other Topics Concern   Not on file  Social History Narrative   Not on file   Social Determinants of Health   Financial Resource Strain: Low Risk    Difficulty of Paying Living Expenses: Not hard at all  Food Insecurity: No Food Insecurity   Worried About Charity fundraiser in the Last Year: Never true   [place] in the Last Year: Never true  Transportation Needs: No Transportation Needs   Lack of Transportation (Medical): No   Lack of Transportation (Non-Medical): No  Physical Activity: Sufficiently Active   Days of Exercise per Week: 5 days   Minutes of Exercise per Session: 30 min  Stress: No Stress Concern Present   Feeling of Stress : Only a little  Social Connections:    Frequency of Communication with Friends and Family:    Frequency of Social Gatherings with Friends and Family:    Attends Religious Services:    Active Member of Clubs or Organizations:  Attends Music therapist:    Marital Status:   Intimate Partner Violence:    Fear of Current or Ex-Partner:    Emotionally Abused:    Physically Abused:    Sexually Abused:     FAMILY HISTORY: Family History  Problem Relation Age of Onset   Colon cancer Neg Hx    Esophageal cancer Neg Hx    Rectal cancer Neg Hx    Stomach cancer Neg Hx     ALLERGIES:  is allergic to erythromycin base.  MEDICATIONS:  Current Outpatient Medications  Medication Sig Dispense Refill   albuterol (VENTOLIN HFA) 108 (90 Base) MCG/ACT inhaler Inhale 1 puff into the lungs as needed. Use as directed     amLODipine (NORVASC) 10 MG tablet TAKE 1 TABLET BY MOUTH EVERY DAY IN THE MORNING (Patient taking differently: Take 10 mg by mouth daily. ) 90 tablet 1   aspirin 81 MG EC tablet Take 1 tablet (81 mg total) by mouth daily. 90 tablet 3   atorvastatin (LIPITOR) 40 MG tablet Take 1 tablet (40 mg total) by mouth daily. 90 tablet 3    azelastine (ASTELIN) 0.1 % nasal spray Place 1 spray into both nostrils as needed for allergies.   5   clopidogrel (PLAVIX) 75 MG tablet Take 1 tablet (75 mg total) by mouth daily. 90 tablet 3   fexofenadine (ALLEGRA) 60 MG tablet Take 60 mg by mouth as needed for allergies.      Fluticasone-Salmeterol 232-14 MCG/ACT AEPB Inhale 1 puff into the lungs 2 (two) times daily.     methocarbamol (ROBAXIN) 500 MG tablet Take 1 tablet (500 mg total) by mouth 2 (two) times daily as needed for muscle spasms. 20 tablet 0   montelukast (SINGULAIR) 10 MG tablet Take 10 mg by mouth every morning.     nitroGLYCERIN (NITROSTAT) 0.4 MG SL tablet Place 1 tablet (0.4 mg total) under the tongue every 5 (five) minutes as needed for chest pain. 25 tablet 0   oxyCODONE (OXY IR/ROXICODONE) 5 MG immediate release tablet Take 1 tablet (5 mg total) by mouth every 4 (four) hours as needed for severe pain. 30 tablet 0   pantoprazole (PROTONIX) 40 MG tablet TAKE 1 TABLET BY MOUTH EVERY DAY (Patient taking differently: Take 40 mg by mouth daily. ) 90 tablet 1   spironolactone (ALDACTONE) 25 MG tablet TAKE 1 TABLET BY MOUTH EVERY DAY (Patient taking differently: Take 25 mg by mouth daily. ) 90 tablet 1   No current facility-administered medications for this visit.    REVIEW OF SYSTEMS:   Constitutional: Denies fevers, chills or abnormal night sweats (+) low grade fever (+) fatigue  Eyes: Denies blurriness of vision, double vision or watery eyes Ears, nose, mouth, throat, and face: Denies mucositis or sore throat Respiratory: Denies wheezes (+) cough (+) exertional dyspnea (+) COPD  Cardiovascular: Denies palpitation, chest discomfort or lower extremity swelling Gastrointestinal:  Denies nausea, diarrhea, heartburn or change in bowel habits (+) bloating (+) RUQ and epigastric pain (+) constipation  Skin: Denies abnormal skin rashes Lymphatics: Denies new lymphadenopathy or easy bruising Neurological:Denies  numbness, tingling or new weaknesses Behavioral/Psych: Mood is stable, no new changes  All other systems were reviewed with the patient and are negative.  PHYSICAL EXAMINATION: ECOG PERFORMANCE STATUS: 1 - Symptomatic but completely ambulatory  Vitals:   03/13/20 1342  BP: 119/75  Pulse: 99  Resp: 17  Temp: 97.7 F (36.5 C)  SpO2: 96%   Filed Weights  03/13/20 1342  Weight: 231 lb 11.2 oz (105.1 kg)    GENERAL:alert, no distress and comfortable SKIN: no rash  EYES: sclera clear NECK: without mass  LYMPH:  no palpable cervical or supraclavicular lymphadenopathy  LUNGS: clear with normal breathing effort, no wheez HEART: regular rate & rhythm, no lower extremity edema ABDOMEN: abdomen soft with normal bowel sounds. Mild epigastric tenderness. Palpable liver edge.  PSYCH: alert & oriented x 3 with fluent speech NEURO: no focal motor/sensory deficits  LABORATORY DATA:  I have reviewed the data as listed CBC Latest Ref Rng & Units 03/01/2020 05/05/2019 05/04/2019  WBC 4.0 - 10.5 K/uL 8.9 9.2 8.3  Hemoglobin 13.0 - 17.0 g/dL 13.8 13.7 13.3  Hematocrit 39.0 - 52.0 % 41.8 40.1 38.2(L)  Platelets 150 - 400 K/uL 296 240 210   CMP Latest Ref Rng & Units 03/01/2020 06/28/2019 05/05/2019  Glucose 70 - 99 mg/dL 100(H) - 119(H)  BUN 8 - 23 mg/dL 12 - 9  Creatinine 0.61 - 1.24 mg/dL 0.86 - 0.87  Sodium 135 - 145 mmol/L 137 - 134(L)  Potassium 3.5 - 5.1 mmol/L 4.4 - 3.7  Chloride 98 - 111 mmol/L 101 - 101  CO2 22 - 32 mmol/L 26 - 22  Calcium 8.9 - 10.3 mg/dL 8.8(L) - 8.4(L)  Total Protein 6.5 - 8.1 g/dL 6.9 7.0 -  Total Bilirubin 0.3 - 1.2 mg/dL 0.8 0.6 -  Alkaline Phos 38 - 126 U/L 134(H) 84 -  AST 15 - 41 U/L 39 17 -  ALT 0 - 44 U/L 48(H) 18 -     RADIOGRAPHIC STUDIES: I have personally reviewed the radiological images as listed and agreed with the findings in the report. DG Chest 2 View  Result Date: 03/01/2020 CLINICAL DATA:  Chest pain EXAM: CHEST - 2 VIEW COMPARISON:   May 03, 2019 FINDINGS: There is mild bibasilar atelectasis. Lungs otherwise are clear. The heart size and pulmonary vascularity are normal. No adenopathy. There are foci left anterior descending coronary artery calcification. There is aortic atherosclerosis. There is degenerative change in the thoracic spine. There is a loop recorder on the left anteriorly. IMPRESSION: Bibasilar atelectasis.  Lungs otherwise clear.  Heart size normal. Coronary artery calcification noted. Aortic Atherosclerosis (ICD10-I70.0). Loop recorder on left anteriorly. Electronically Signed   By: Lowella Grip III M.D.   On: 03/01/2020 10:23   CT Angio Chest PE W/Cm &/Or Wo Cm  Result Date: 03/01/2020 CLINICAL DATA:  Shortness of breath. Concern for pulmonary embolism. Positive D-dimer. EXAM: CT ANGIOGRAPHY CHEST WITH CONTRAST TECHNIQUE: Multidetector CT imaging of the chest was performed using the standard protocol during bolus administration of intravenous contrast. Multiplanar CT image reconstructions and MIPs were obtained to evaluate the vascular anatomy. CONTRAST:  70mL OMNIPAQUE IOHEXOL 350 MG/ML SOLN COMPARISON:  None. FINDINGS: Cardiovascular: Contrast injection is sufficient to demonstrate satisfactory opacification of the pulmonary arteries to the segmental level. There is no pulmonary embolus. The main pulmonary artery is within normal limits for size. There is no CT evidence of acute right heart strain. There are atherosclerotic changes of the visualized thoracic aorta. The thoracic aorta is borderline aneurysmal measuring approximately 4 cm in diameter. Heart size is coronary artery calcifications are noted. Mediastinum/Nodes: --there are mildly enlarged mediastinal lymph nodes. For example there is a right paratracheal lymph node measuring approximately 1.5 cm (axial series 5, image 52). --No axillary lymphadenopathy. --No supraclavicular lymphadenopathy. --Normal thyroid gland. --The esophagus is unremarkable  Lungs/Pleura: There is no pneumothorax. Atelectasis is noted  at the lung bases. There is no significant pleural effusion. There is a nodule along the major fissure on the left favored to represent a small lymph node. Upper Abdomen: There are innumerable low-attenuation masses throughout the patient's liver. There are few mildly enlarged lymph nodes in the upper abdomen. Musculoskeletal: No chest wall abnormality. No acute or significant osseous findings. Review of the MIP images confirms the above findings. IMPRESSION: 1. No acute pulmonary embolism. 2. There are innumerable masses in the patient's liver concerning for metastatic disease, currently with an unknown primary. 3. There are mildly enlarged mediastinal lymph nodes which may be reactive or metastatic. Attention on follow-up examinations is recommended. 4. Bibasilar atelectasis is noted. Aortic Atherosclerosis (ICD10-I70.0). Electronically Signed   By: Constance Holster M.D.   On: 03/01/2020 19:03   CT ABDOMEN PELVIS W CONTRAST  Result Date: 03/01/2020 CLINICAL DATA:  Chest CTA with findings suspicious for hepatic metastatic disease. EXAM: CT ABDOMEN AND PELVIS WITH CONTRAST TECHNIQUE: Multidetector CT imaging of the abdomen and pelvis was performed using the standard protocol following bolus administration of intravenous contrast. CONTRAST:  27mL OMNIPAQUE IOHEXOL 300 MG/ML  SOLN COMPARISON:  Chest CTA earlier today. Abdominopelvic CT 08/17/2018 FINDINGS: Lower chest: Assessed on chest CT earlier today. Right greater than left basilar atelectasis. Pleural thickening without significant effusion. Hepatobiliary: Innumerable low-density lesions scattered throughout the liver parenchyma most consistent with diffuse hepatic metastatic disease. Majority of the lesions are 1-2 cm. Largest lesion in the right hepatic dome measures 2.4 cm. Gallbladder physiologically distended, no calcified stone. No biliary dilatation. Pancreas: No ductal dilatation or  inflammation. No evidence of pancreatic mass. Spleen: Normal in size without focal abnormality. Adrenals/Urinary Tract: Mild thickening of the lateral left adrenal gland is unchanged from prior exam. The right adrenal gland is normal. There is no suspicious adrenal nodule. No hydronephrosis. There is symmetric bilateral perinephric edema. There is a 3.3 cm exophytic low-density lesion from the posterior left kidney that likely represents a minimally complex cyst. This is not significantly changed in size from prior exam. Excreted IV contrast within both renal collecting systems which limits assessment for stone. Probable parapelvic cyst in the left kidney. There is excreted IV contrast within the urinary bladder without evidence of bladder wall thickening or focal lesion. Stomach/Bowel: Detailed bowel evaluation is limited in the absence of enteric contrast. Mild wall thickening of the distal esophagus. Unopacified stomach is unremarkable. Normal positioning of the ligament of Treitz. Small duodenal diverticulum. No small bowel inflammation or obstruction. No obvious small bowel mass. Normal appendix. Moderate volume of stool throughout the colon. Diverticulosis from the splenic flexure distally. No diverticulitis. Few areas of nondistention involving the sigmoid colon likely related to peristalsis, no obvious colonic mass. Vascular/Lymphatic: There is a 12 mm portal caval node, series 3, image 24. Suspected 14 mm retrocrural node adjacent to the distal esophagus, series 3, image 15. clustered prominent nodes adjacent to the celiac axis measuring up to 10 mm, series 3, image 22. 8 mm SMA node, series 3, image 28. There is left periaortic retroperitoneal adenopathy, with nodes measuring up to 13 mm, series 3, image 37. No definite enlarged pelvic lymph nodes. Ectatic infrarenal aorta measuring 2.7 cm. Aortic atherosclerosis. The portal vein is patent. Reproductive: Prostate is unremarkable. Other: Trace fluid  adjacent to the inferior liver tip and edema in the right pericolic gutter. No significant ascites. No omental thickening or nodularity. No free air. Prior left inguinal hernia repair. There is fat in the right inguinal canal. Musculoskeletal:  No blastic osseous lesion. No evidence of destructive lytic lesion. Multilevel degenerative change in the spine. No suspicious subcutaneous or abdominal wall lesion. IMPRESSION: 1. Innumerable low-density lesions scattered throughout the liver parenchyma most consistent with diffuse hepatic metastatic disease. 2. Retroperitoneal and upper abdominal adenopathy, suspicious for metastatic disease. 3. Primary malignancy is not definitively characterized, however there is wall thickening of the distal esophagus which raises concern for occult esophageal primary. Recommend further evaluation with endoscopy. 4. Colonic diverticulosis without diverticulitis. Areas of nondistention of the sigmoid colon without dominant colonic mass. 5. Ectatic abdominal aorta at risk for aneurysm development, maximal dimension 2.7 cm. Recommend followup by ultrasound in 5 years. This recommendation follows ACR consensus guidelines: White Paper of the ACR Incidental Findings Committee II on Vascular Findings. J Am Coll Radiol 2013; 10:789-794. Aortic aneurysm NOS (ICD10-I71.9) Aortic Atherosclerosis (ICD10-I70.0). Electronically Signed   By: Keith Rake M.D.   On: 03/01/2020 21:30   CUP PACEART REMOTE DEVICE CHECK  Result Date: 02/20/2020 [facility] summary report received. Battery status OK. Normal device function. No new symptom episodes, tachy episodes, brady, or pause episodes. No new AF episodes. Monthly summary reports and ROV/PRN [employer]   ASSESSMENT & PLAN: 69 yo male with   1. Multiple liver masses, concerning for malignancy  -We reviewed his medical record and imaging in detail with the patient and his wife. He presented with arthralgia and fatigue, initially thought to be related  to high-dose statin. Due to prior h/o NSTEMI, he presented to ED for further eval. Work up showed was negative for ACS or PE but did show multiple liver masses, mediastinal and RP adenopathy, and wall thickening of the distal esophagus. The clinical picture is concerning for malignancy.  -We are referring him for liver biopsy to confirm malignancy and determine the primary site and to confirm metastatic disease.  -If liver biopsy is negative or inconclusive, we recommend for him to proceed with endoscopy per GI Dr. Henrene Pastor, he has an appointment on 03/16/20 which he will keep to go ahead and discuss the procedure.   -Once his liver biopsy is scheduled, we will see him back a few days later with lab to discuss diagnosis, prognosis and finalize a treatment plan.  -He has RUQ and epigastric pain currently managed with oxycodone PRN. He has expected constipation that has improved.  -Currently he is able to maintain adequate nutrition and hydration with stable weight, denies dysphagia or  odynophagia.   2. Cough, COPD -Maintained on fluticasone-almeterol 2 puffs daily, singulair, and ventolin PRN -He developed a dry cough with exertional dyspnea after ED visit on 5/26, no sputum production -His cough has improved with symptom management at home. Currently taking mucinex and using essential oil diffusers. He would like to try stronger cough suppressant which I prescribed for him today  -No wheezing on exam; No effusion or consolidation on recent CTA chest.  -we recommend to f/u with allergist and/or PCP to see if he would benefit from steroids or other medication. He has had this cough in the past.  -We reviewed that his imaging shows wall thickening in the distal esophagus, unclear if this is contributing to his cough  3. H/o NSTEMI, CAD, CVA, HTN, HL -he had CVA in 2008, no residual weakness or deficits  -s/p MI in 04/2019, cardiac cath showed severe LAD stenosis treated with DES. Moderate disease in the  circumflex. Echo showed LVEF 65% without significant valve disease. Discharged on aspirin and brilinta which was changed to plavix in 11/2019  due to cost. No BB due to nocturnal bradycardia  -continues plavix, aspirin, lipitor, amlodipine, aldactone per PCP and cardiology  -Continue f/u per cardiology Dr. Lauree Chandler   PLAN: -Reviewed recent labs and imaging -Proceed with GI consult with Dr. Henrene Pastor as planned on 03/16/20  -Proceed with US liver biopsy on 03/20/20 -Lab and f/u 6/17 or 6/18 -Cough suppressant  -CC note to GI    Orders Placed This Encounter  Procedures   US BIOPSY (LIVER)    Standing Status:   Future    Standing Expiration Date:   03/14/2021    Order Specific Question:   Lab orders requested (DO NOT place separate lab orders, these will be automatically ordered during procedure specimen collection):    Answer:   Surgical Pathology    Order Specific Question:   Reason for Exam (SYMPTOM  OR DIAGNOSIS REQUIRED)    Answer:   multiple liver masses concerning for malignancy. confirm primary site and metastatic disease    Order Specific Question:   Preferred location?    Answer:   [facility]    All questions were answered. The patient knows to call the clinic with any problems, questions or concerns.     [name], NP 03/14/2020 3:20 PM  Addendum  I have seen the patient, examined him. I agree with the assessment and and plan and have edited the notes.   Charles Mccoy is a 69 yo male with past medical history of coronary artery disease, CVA, COPD, presented with right upper quadrant abdominal pain and atypical chest pain.  He was seen in ED on Mar 01, 2020, CT scan showed diffuse liver lesions and abdominal adenopathy, which is most consistent with metastatic malignancy.  CT chest was negative.  The primary site of malignancy is uncertain on CT scan, except were thickening of the distal esophagus.  Patient denies any dysphagia or odynophagia, labs showed no  evidence of anemia.  I reviewed his CT scan images with patient and his wife in person, and recommend ultrasound-guided liver biopsy for diagnosis.  We discussed that liver biopsy is likely going to give [place] a tissue diagnosis, and its origin of primary tumor.  He is scheduled to see his gastroenterologist Dr. Henrene Pastor later this week.  If his liver biopsy is not diagnostic, I recommend EGD with or without colonoscopy for malignancy work-up. He voiced good understanding, and agrees with the plan.  I will see him back after biopsy.  Truitt Merle MD 03/13/2020

## 2020-03-13 ENCOUNTER — Encounter: Payer: Self-pay | Admitting: Nurse Practitioner

## 2020-03-13 ENCOUNTER — Inpatient Hospital Stay: Payer: Medicare Other | Attending: Nurse Practitioner | Admitting: Nurse Practitioner

## 2020-03-13 ENCOUNTER — Other Ambulatory Visit: Payer: Self-pay

## 2020-03-13 VITALS — BP 119/75 | HR 99 | Temp 97.7°F | Resp 17 | Ht 76.0 in | Wt 231.7 lb

## 2020-03-13 DIAGNOSIS — I2510 Atherosclerotic heart disease of native coronary artery without angina pectoris: Secondary | ICD-10-CM | POA: Insufficient documentation

## 2020-03-13 DIAGNOSIS — R599 Enlarged lymph nodes, unspecified: Secondary | ICD-10-CM | POA: Diagnosis not present

## 2020-03-13 DIAGNOSIS — K229 Disease of esophagus, unspecified: Secondary | ICD-10-CM | POA: Diagnosis not present

## 2020-03-13 DIAGNOSIS — D649 Anemia, unspecified: Secondary | ICD-10-CM | POA: Diagnosis not present

## 2020-03-13 DIAGNOSIS — Z7982 Long term (current) use of aspirin: Secondary | ICD-10-CM

## 2020-03-13 DIAGNOSIS — J441 Chronic obstructive pulmonary disease with (acute) exacerbation: Secondary | ICD-10-CM | POA: Insufficient documentation

## 2020-03-13 DIAGNOSIS — I252 Old myocardial infarction: Secondary | ICD-10-CM | POA: Diagnosis not present

## 2020-03-13 DIAGNOSIS — R1011 Right upper quadrant pain: Secondary | ICD-10-CM | POA: Insufficient documentation

## 2020-03-13 DIAGNOSIS — R791 Abnormal coagulation profile: Secondary | ICD-10-CM | POA: Diagnosis not present

## 2020-03-13 DIAGNOSIS — N289 Disorder of kidney and ureter, unspecified: Secondary | ICD-10-CM | POA: Insufficient documentation

## 2020-03-13 DIAGNOSIS — R509 Fever, unspecified: Secondary | ICD-10-CM | POA: Diagnosis not present

## 2020-03-13 DIAGNOSIS — R160 Hepatomegaly, not elsewhere classified: Secondary | ICD-10-CM | POA: Insufficient documentation

## 2020-03-13 DIAGNOSIS — Z808 Family history of malignant neoplasm of other organs or systems: Secondary | ICD-10-CM | POA: Diagnosis not present

## 2020-03-13 DIAGNOSIS — R1013 Epigastric pain: Secondary | ICD-10-CM | POA: Diagnosis not present

## 2020-03-13 DIAGNOSIS — I119 Hypertensive heart disease without heart failure: Secondary | ICD-10-CM | POA: Diagnosis not present

## 2020-03-13 DIAGNOSIS — J449 Chronic obstructive pulmonary disease, unspecified: Secondary | ICD-10-CM | POA: Diagnosis not present

## 2020-03-13 DIAGNOSIS — K5900 Constipation, unspecified: Secondary | ICD-10-CM | POA: Insufficient documentation

## 2020-03-13 DIAGNOSIS — R634 Abnormal weight loss: Secondary | ICD-10-CM | POA: Diagnosis not present

## 2020-03-13 DIAGNOSIS — R05 Cough: Secondary | ICD-10-CM | POA: Diagnosis not present

## 2020-03-13 DIAGNOSIS — Z8673 Personal history of transient ischemic attack (TIA), and cerebral infarction without residual deficits: Secondary | ICD-10-CM

## 2020-03-13 DIAGNOSIS — Z87891 Personal history of nicotine dependence: Secondary | ICD-10-CM | POA: Insufficient documentation

## 2020-03-13 NOTE — Progress Notes (Signed)
Met with patient and his wife [name] today at his initial medical oncology appointment with Cira Rue NP and Dr. Burr Medico.  I had previously spoken to the patient by phone.  They were given my card with my direct phone number and encouraged to call with questions or concerns.  They understand my role as nurse navigator.

## 2020-03-14 ENCOUNTER — Encounter: Payer: Self-pay | Admitting: Internal Medicine

## 2020-03-14 ENCOUNTER — Telehealth: Payer: Self-pay | Admitting: Nurse Practitioner

## 2020-03-14 ENCOUNTER — Encounter (HOSPITAL_COMMUNITY): Payer: Self-pay | Admitting: Radiology

## 2020-03-14 ENCOUNTER — Encounter: Payer: Self-pay | Admitting: Nurse Practitioner

## 2020-03-14 MED ORDER — HYDROCOD POLST-CPM POLST ER 10-8 MG/5ML PO SUER: 5.0000 mL | Freq: Two times a day (BID) | ORAL | 0 refills | Status: DC | PRN

## 2020-03-14 NOTE — Telephone Encounter (Signed)
Scheduled appt per 6/8 sch message - pt and wife is aware of appt.

## 2020-03-14 NOTE — Progress Notes (Signed)
Charles Mccoy Male, 69 y.o., 1950-11-18 MRN:  [id number] Phone:  [phone] (H) PCP:  [name], [name], MD Primary Cvg:  Medicare/Medicare Part A And B Next Appt With Gastroenterology 03/16/2020 at 2:20 PM  RE: US Liver Biopsy Received: Today Message Contents  [name], MD  [name], MD; [name], NP  OK to hold Plavix for 5-7 days before his procedure. [name]       Previous Messages   ----- Message -----  From: [name]  Sent: 03/14/2020  9:28 AM EDT  To: [name], MD, [name], NP, *  Subject: FW: US Liver Biopsy                Request for Liver Biopsy, patient is on Plavix and will need to hold 5 days prior to biopsy. Please advise if ok to hold Plavix. Thanks [name]  ----- Message -----  From: [name]  Sent: 03/14/2020  9:22 AM EDT  To: [name]  Subject: US Liver Biopsy                  Procedure: US Liver Biopsy   Reason: Mass of multiple sites of liver, multiple liver masses concerning for malignancy. confirm primary site and metastatic disease   History: CT in computer   Provider: [name]   Provider Contact: [phone]

## 2020-03-14 NOTE — Progress Notes (Signed)
Emmaus Brandi Male, 69 y.o., [birth year] MRN:  [id number] Phone:  [phone] (H) PCP:  [name], [name], MD Primary Cvg:  Medicare/Medicare Part A And B Next Appt With Gastroenterology 03/16/2020 at 2:20 PM  RE: US Liver Biopsy Received: Today Message Contents  Aletta Edouard, MD  [name]  [place] for US guided liver lesion biopsy w/ sedation.   GY       Previous Messages   ----- Message -----  From: [name]  Sent: 03/14/2020  9:22 AM EDT  To: [name]  Subject: US Liver Biopsy                  Procedure: US Liver Biopsy   Reason: Mass of multiple sites of liver, multiple liver masses concerning for malignancy. confirm primary site and metastatic disease   History: CT in computer   Provider: [name]   Provider Contact: [phone]

## 2020-03-16 ENCOUNTER — Ambulatory Visit (INDEPENDENT_AMBULATORY_CARE_PROVIDER_SITE_OTHER): Payer: Medicare Other | Admitting: Internal Medicine

## 2020-03-16 ENCOUNTER — Other Ambulatory Visit: Payer: Self-pay | Admitting: Radiology

## 2020-03-16 ENCOUNTER — Encounter: Payer: Self-pay | Admitting: Internal Medicine

## 2020-03-16 VITALS — BP 108/62 | HR 93 | Ht 75.5 in | Wt 230.4 lb

## 2020-03-16 DIAGNOSIS — R1011 Right upper quadrant pain: Secondary | ICD-10-CM

## 2020-03-16 DIAGNOSIS — K219 Gastro-esophageal reflux disease without esophagitis: Secondary | ICD-10-CM

## 2020-03-16 DIAGNOSIS — I63412 Cerebral infarction due to embolism of left middle cerebral artery: Secondary | ICD-10-CM | POA: Diagnosis not present

## 2020-03-16 DIAGNOSIS — G8929 Other chronic pain: Secondary | ICD-10-CM

## 2020-03-16 DIAGNOSIS — R935 Abnormal findings on diagnostic imaging of other abdominal regions, including retroperitoneum: Secondary | ICD-10-CM | POA: Diagnosis not present

## 2020-03-16 NOTE — Patient Instructions (Signed)
Please follow up as needed 

## 2020-03-16 NOTE — Progress Notes (Signed)
HISTORY OF PRESENT ILLNESS:  Charles Mccoy is a 70 y.o. male with multiple medical problems as listed below including history of coronary artery disease on Plavix who was sent today by oncology regarding possible need for endoscopy.  The patient was evaluated in the emergency room Mar 01, 2020 regarding sharp rectal quadrant pain exacerbated by deep breathing.  Underwent CT angiogram of the chest to rule out pulmonary embolism.  There was no evidence for pulmonary embolism.  However, there were innumerable masses in the patient's liver concerning for metastatic disease.  He is also noted to have large mediastinal lymph nodes.  He subsequently underwent CT of the abdomen pelvis which revealed similar changes in the liver as well as retroperitoneal and upper abdominal adenopathy.  Thickening of the distal esophagus was noted raising the question of esophageal carcinoma.  The colon was normal except for diverticulosis.  Patient has undergone colonoscopy in this office in 2007 and again in 2015.  Both examinations revealed diverticulosis but no other abnormalities.  No polyps.  Blood work in the emergency room revealed mildly elevated ALT at 48.  Otherwise normal liver tests.  CBC with hemoglobin 13.8.  Oncology has the patient set up for liver biopsy 11/21/2019.  Pending the results of this he may need upper endoscopy.  Patient tells me that he continues to have right upper quadrant pain with deep breathing.  He denies weight loss or dysphagia.  Does take pantoprazole for history of reflux disease.  Symptoms are controlled on this medication.  He denies change in bowel habits or bleeding.  He is accompanied today by his wife and son.  Patient does tell me that he is had a nagging cough.    REVIEW OF SYSTEMS:  All non-GI ROS negative as otherwise stated in the HPI except for present allergy, back pain, cough, fatigue, itching, shortness of breath, excessive thirst  Past Medical History:  Diagnosis Date  .  ALLERGIC RHINITIS 06/19/2007  . ASTHMA 08/17/2008  . Back pain 05/2016  . CAD (coronary artery disease)    05/03/19- NSTEMI, 05/04/19 DES to LAD  . COPD (chronic obstructive pulmonary disease) ([place])   . GERD (gastroesophageal reflux disease)   . Hyperlipidemia   . HYPERTENSION 06/19/2007  . OSTEOARTHRITIS 10/09/2009   wrists, knee.  . Pneumonia    hx of   . SINUSITIS 09/15/2007   recent issuses 2 weeks ago-clearing.  . [facility]roke [facility])     Past Surgical History:  Procedure Laterality Date  . CARDIAC CATHETERIZATION    . CORONARY STENT INTERVENTION N/A 05/04/2019   Procedure: CORONARY STENT INTERVENTION;  Surgeon: Leonie Man, MD;  Location: [place];  Service: Cardiovascular;  Laterality: N/A;  . cyst removed from neck      age 53   . KNEE ARTHROSCOPY     2 on left knee , 1 on right  . LEFT HEART CATH AND CORONARY ANGIOGRAPHY N/A 05/04/2019   Procedure: LEFT HEART CATH AND CORONARY ANGIOGRAPHY;  Surgeon: Leonie Man, MD;  Location: [facility];  Service: Cardiovascular;  Laterality: N/A;  . LOOP RECORDER INSERTION N/A 01/28/2018   Procedure: LOOP RECORDER INSERTION;  Surgeon: Evans Lance, MD;  Location: [facility];  Service: Cardiovascular;  Laterality: N/A;  . TEE WITHOUT CARDIOVERSION N/A 01/28/2018   Procedure: TRANSESOPHAGEAL ECHOCARDIOGRAM (TEE);  Surgeon: Acie Fredrickson Wonda Cheng, MD;  Location: [facility];  Service: Cardiovascular;  Laterality: N/A;  . TOTAL KNEE ARTHROPLASTY Left 07/21/2014   Procedure:  LEFT TOTAL KNEE ARTHROPLASTY;  Surgeon: Johnn Hai, MD;  Location: [facility];  Service: Orthopedics;  Laterality: Left;  . TOTAL KNEE ARTHROPLASTY Right 05/30/2016   Procedure: RIGHT TOTAL KNEE ARTHROPLASTY REPLACEMENT;  Surgeon: Susa Day, MD;  Location: [facility];  Service: Orthopedics;  Laterality: Right;    Social History Charles Mccoy  reports that he quit smoking about 18 years ago. His smoking use included cigarettes. He has never used  smokeless tobacco. He reports previous alcohol use of about 6.0 standard drinks of alcohol per week. He reports that he does not use drugs.  family history includes Cancer in his father.  Allergies  Allergen Reactions  . Erythromycin Base Other (See Comments)    REACTION: stomach irritation, patient is unaware of this allergy       PHYSICAL EXAMINATION: Vital signs: BP 108/62   Pulse 93   Ht 6' 3.5" (1.918 m)   Wt 230 lb 6 oz (104.5 kg)   BMI 28.41 kg/m   Constitutional: generally well-appearing, no acute distress Psychiatric: alert and oriented x3, cooperative Eyes: extraocular movements intact, anicteric, conjunctiva pink Mouth: oral pharynx moist, no lesions Neck: supple no lymphadenopathy Cardiovascular: heart regular rate and rhythm, no murmur Lungs: clear to auscultation bilaterally Abdomen: soft, nontender, nondistended, no obvious ascites, no peritoneal signs, normal bowel sounds, no organomegaly.  Complains of tenderness with deep inspiration. Rectal: Omitted Extremities: no, cyanosis, or lower extremity edema bilaterally Skin: no lesions on visible extremities Neuro: No focal deficits.  Cranial nerves intact  ASSESSMENT:  1.  Abnormal CT scan with upper abdominal and chest adenopathy as well as diffuse liver lesions consistent with metastatic cancer.  The esophagus on CT.  Complains of nagging cough.  I favor lung first, esophagus second as potential etiologies. 2.  Upper quadrant pain with inspiration due to hepatic metastases 3.  History of GERD.  Thickened esophagus on CT.  No dysphagia.  May need clarified. 4.  Colonoscopy 2007 and 2015 with diverticulosis only.  Do NOT suspect primary colonic lesion. 5.  Multiple medical problems including coronary artery disease on Plavix  PLAN:  1.  Liver biopsy as arranged by oncology 2.  Pending the results of the biopsy, oncology may need upper endoscopy.  If necessary, the nature of the procedure, as well as the risks,  benefits, and alternatives were carefully and thoroughly reviewed with the patient. Ample time for discussion and questions allowed. The patient understood, was satisfied, and agreed to proceed. Patient is high risk given his comorbidity and the need to address Plavix therapy.  They will let me know.  I have discussed this with the patient 3.  Continue PPI Questions from the patient and his family answered to their satisfaction.

## 2020-03-20 ENCOUNTER — Encounter (HOSPITAL_COMMUNITY): Payer: Self-pay

## 2020-03-20 ENCOUNTER — Encounter: Payer: Self-pay | Admitting: Nurse Practitioner

## 2020-03-20 ENCOUNTER — Ambulatory Visit (HOSPITAL_COMMUNITY)
Admission: RE | Admit: 2020-03-20 | Discharge: 2020-03-20 | Disposition: A | Discharge status: Home / Self Care | Payer: Medicare Other | Source: Ambulatory Visit | Attending: Radiology | Admitting: Radiology

## 2020-03-20 ENCOUNTER — Ambulatory Visit (HOSPITAL_COMMUNITY)
Admission: RE | Admit: 2020-03-20 | Discharge: 2020-03-20 | Disposition: A | Discharge status: Home / Self Care | Payer: Medicare Other | Source: Ambulatory Visit | Attending: Nurse Practitioner | Admitting: Nurse Practitioner

## 2020-03-20 ENCOUNTER — Telehealth: Payer: Self-pay

## 2020-03-20 ENCOUNTER — Other Ambulatory Visit: Payer: Self-pay

## 2020-03-20 ENCOUNTER — Telehealth: Payer: Self-pay | Admitting: Nurse Practitioner

## 2020-03-20 DIAGNOSIS — C787 Secondary malignant neoplasm of liver and intrahepatic bile duct: Secondary | ICD-10-CM | POA: Diagnosis not present

## 2020-03-20 DIAGNOSIS — J9811 Atelectasis: Secondary | ICD-10-CM | POA: Diagnosis not present

## 2020-03-20 DIAGNOSIS — R0602 Shortness of breath: Secondary | ICD-10-CM | POA: Diagnosis not present

## 2020-03-20 DIAGNOSIS — K7689 Other specified diseases of liver: Secondary | ICD-10-CM | POA: Diagnosis not present

## 2020-03-20 DIAGNOSIS — R160 Hepatomegaly, not elsewhere classified: Secondary | ICD-10-CM | POA: Insufficient documentation

## 2020-03-20 DIAGNOSIS — R079 Chest pain, unspecified: Secondary | ICD-10-CM | POA: Diagnosis not present

## 2020-03-20 DIAGNOSIS — C801 Malignant (primary) neoplasm, unspecified: Secondary | ICD-10-CM | POA: Diagnosis not present

## 2020-03-20 DIAGNOSIS — R590 Localized enlarged lymph nodes: Secondary | ICD-10-CM | POA: Diagnosis not present

## 2020-03-20 DIAGNOSIS — R000 Tachycardia, unspecified: Secondary | ICD-10-CM | POA: Insufficient documentation

## 2020-03-20 LAB — CBC
HCT: 43 % (ref 39.0–52.0)
HCT: 38.4 % — ABNORMAL LOW (ref 39.0–52.0)
Hemoglobin: 14.2 g/dL (ref 13.0–17.0)
Hemoglobin: 12.7 g/dL — ABNORMAL LOW (ref 13.0–17.0)
MCH: 26.2 pg (ref 26.0–34.0)
MCH: 26.1 pg (ref 26.0–34.0)
MCHC: 33 g/dL (ref 30.0–36.0)
MCHC: 33.1 g/dL (ref 30.0–36.0)
MCV: 79.5 fL — ABNORMAL LOW (ref 80.0–100.0)
MCV: 79 fL — ABNORMAL LOW (ref 80.0–100.0)
Platelets: 274 10*3/uL (ref 150–400)
Platelets: 201 10*3/uL (ref 150–400)
RBC: 5.41 MIL/uL (ref 4.22–5.81)
RBC: 4.86 MIL/uL (ref 4.22–5.81)
RDW: 14.3 % (ref 11.5–15.5)
RDW: 14.1 % (ref 11.5–15.5)
WBC: 20.1 10*3/uL — ABNORMAL HIGH (ref 4.0–10.5)
WBC: 16.9 10*3/uL — ABNORMAL HIGH (ref 4.0–10.5)
nRBC: 0 % (ref 0.0–0.2)
nRBC: 0 % (ref 0.0–0.2)

## 2020-03-20 LAB — PROTIME-INR
INR: 1.7 — ABNORMAL HIGH (ref 0.8–1.2)
Prothrombin Time: 19.4 seconds — ABNORMAL HIGH (ref 11.4–15.2)

## 2020-03-20 MED ORDER — SODIUM CHLORIDE 0.9 % IV SOLN: INTRAVENOUS | Status: DC

## 2020-03-20 NOTE — Progress Notes (Signed)
Chief Complaint: Patient was seen in consultation today for liver lesion biopsy at the request of Burton,Lacie K  Referring Physician(s): Burton,Lacie K Dr Ky Barban  Supervising Physician: Aletta Edouard  Patient Status: [facility] - Out-pt  History of Present Illness: Charles Mccoy is a 69 y.o. male   Pt had been on Brilinta after Cardiac stent placement 04/2019 Was changed to Plavix 11/2019 secondary cost  Developed body aches and fatigue-- enough to prompt pt to go to ED Pt felt possibly secondary statin meds Work up in ED 03/01/20 included CTA for PE-- Negative result But did show LAN and multiple liver lesions  CTA 5/26: IMPRESSION: 1. No acute pulmonary embolism. 2. There are innumerable masses in the patient's liver concerning for metastatic disease, currently with an unknown primary. 3. There are mildly enlarged mediastinal lymph nodes which may be reactive or metastatic. Attention on follow-up examinations is recommended. 4. Bibasilar atelectasis is noted.  CT same day:  IMPRESSION: 1. Innumerable low-density lesions scattered throughout the liver parenchyma most consistent with diffuse hepatic metastatic disease. 2. Retroperitoneal and upper abdominal adenopathy, suspicious for metastatic disease. 3. Primary malignancy is not definitively characterized, however there is wall thickening of the distal esophagus which raises concern for occult esophageal primary. Recommend further evaluation with endoscopy. 4. Colonic diverticulosis without diverticulitis. Areas of nondistention of the sigmoid colon without dominant colonic mass. 5. Ectatic abdominal aorta at risk for aneurysm development, maximal dimension 2.7 cm. Recommend followup by ultrasound in 5 years  Referred to Oncology-- Dr Burr Medico office requesting biopsy for tissue diagnosis  Hx CAD/cardiac stents 04/2019: LD Plavix 03/14/20     Past Medical History:  Diagnosis Date  . ALLERGIC RHINITIS 06/19/2007  .  ASTHMA 08/17/2008  . Back pain 05/2016  . CAD (coronary artery disease)    05/03/19- NSTEMI, 05/04/19 DES to LAD  . COPD (chronic obstructive pulmonary disease) ([place])   . GERD (gastroesophageal reflux disease)   . Hyperlipidemia   . HYPERTENSION 06/19/2007  . OSTEOARTHRITIS 10/09/2009   wrists, knee.  . Pneumonia    hx of   . SINUSITIS 09/15/2007   recent issuses 2 weeks ago-clearing.  . Stroke [facility])     Past Surgical History:  Procedure Laterality Date  . CARDIAC CATHETERIZATION    . CORONARY STENT INTERVENTION N/A 05/04/2019   Procedure: CORONARY STENT INTERVENTION;  Surgeon: Leonie Man, MD;  Location: [place] [facility];  Service: Cardiovascular;  Laterality: N/A;  . cyst removed from neck      age 15   . KNEE ARTHROSCOPY     2 on left knee , 1 on right  . LEFT HEART CATH AND CORONARY ANGIOGRAPHY N/A 05/04/2019   Procedure: LEFT HEART CATH AND CORONARY ANGIOGRAPHY;  Surgeon: Leonie Man, MD;  Location: [place] [facility];  Service: Cardiovascular;  Laterality: N/A;  . LOOP RECORDER INSERTION N/A 01/28/2018   Procedure: LOOP RECORDER INSERTION;  Surgeon: Evans Lance, MD;  Location: [place] [facility];  Service: Cardiovascular;  Laterality: N/A;  . TEE WITHOUT CARDIOVERSION N/A 01/28/2018   Procedure: TRANSESOPHAGEAL ECHOCARDIOGRAM (TEE);  Surgeon: Acie Fredrickson Wonda Cheng, MD;  Location: [facility] ENDOSCOPY;  Service: Cardiovascular;  Laterality: N/A;  . TOTAL KNEE ARTHROPLASTY Left 07/21/2014   Procedure: LEFT TOTAL KNEE ARTHROPLASTY;  Surgeon: Johnn Hai, MD;  Location: [facility] ORS;  Service: Orthopedics;  Laterality: Left;  . TOTAL KNEE ARTHROPLASTY Right 05/30/2016   Procedure: RIGHT TOTAL KNEE ARTHROPLASTY REPLACEMENT;  Surgeon: Susa Day, MD;  Location: [facility];  Service: Orthopedics;  Laterality: Right;    Allergies: Erythromycin base  Medications: Prior to Admission medications   Medication Sig Start Date End Date Taking? Authorizing Provider  albuterol (VENTOLIN HFA)  108 (90 Base) MCG/ACT inhaler Inhale 1 puff into the lungs every 4 (four) hours as needed for wheezing or shortness of breath. Use as directed 05/11/19  Yes [provider]  amLODipine (NORVASC) 10 MG tablet TAKE 1 TABLET BY MOUTH EVERY DAY IN THE MORNING Patient taking differently: Take 10 mg by mouth daily.  12/01/19  Yes [name], [name], MD  aspirin 81 MG EC tablet Take 1 tablet (81 mg total) by mouth daily. Patient not taking: Reported on 03/16/2020 05/17/19  Yes Daune Perch, NP  atorvastatin (LIPITOR) 40 MG tablet Take 1 tablet (40 mg total) by mouth daily. 02/07/20  Yes [name], MD  azelastine (ASTELIN) 0.1 % nasal spray Place 1 spray into both nostrils as needed for allergies.  01/13/18  Yes [provider]  chlorpheniramine-HYDROcodone (TUSSIONEX) 10-8 MG/5ML SUER Take 5 mLs by mouth every 12 (twelve) hours as needed for cough. 03/14/20  Yes [name], NP  clopidogrel (PLAVIX) 75 MG tablet Take 1 tablet (75 mg total) by mouth daily. Patient not taking: Reported on 03/16/2020 11/08/19  Yes [name], MD  fexofenadine (ALLEGRA) 60 MG tablet Take 60 mg by mouth daily as needed for allergies.    Yes [provider]  Fluticasone-Salmeterol 232-14 MCG/ACT AEPB Inhale 1 puff into the lungs 2 (two) times daily.   Yes [provider]  montelukast (SINGULAIR) 10 MG tablet Take 10 mg by mouth every morning. 05/12/19  Yes [provider]  oxyCODONE (OXY IR/ROXICODONE) 5 MG immediate release tablet Take 1 tablet (5 mg total) by mouth every 4 (four) hours as needed for severe pain. 03/03/20  Yes [name], [name], MD  pantoprazole (PROTONIX) 40 MG tablet TAKE 1 TABLET BY MOUTH EVERY DAY Patient taking differently: Take 40 mg by mouth daily.  11/09/19  Yes [name], [name], MD  spironolactone (ALDACTONE) 25 MG tablet TAKE 1 TABLET BY MOUTH EVERY DAY Patient taking differently: Take 25 mg by mouth daily.  12/01/19   Yes [name], [name], MD  nitroGLYCERIN (NITROSTAT) 0.4 MG SL tablet Place 1 tablet (0.4 mg total) under the tongue every 5 (five) minutes as needed for chest pain. 05/05/19   Furth, Cadence H, PA-C     Family History  Problem Relation Age of Onset  . Cancer Father        laryngeal   . Colon cancer Neg Hx   . Esophageal cancer Neg Hx   . Rectal cancer Neg Hx   . Stomach cancer Neg Hx     Social History   Socioeconomic History  . Marital status: Married    Spouse name: Not on file  . Number of children: 2  . Years of education: Not on file  . Highest education level: High school graduate  Occupational History  . Not on file  Tobacco Use  . Smoking status: Former Smoker    Types: Cigarettes    Quit date: 10/07/2001    Years since quitting: 18.4  . Smokeless tobacco: Never Used  Vaping Use  . Vaping Use: Never used  Substance and Sexual Activity  . Alcohol use: Not Currently    Alcohol/week: 6.0 standard drinks    Types: 6 Cans of beer per week    Comment: beer  daily, none since 02/2020   . Drug use: No  . Sexual activity: Yes  Other Topics Concern  . Not on file  Social History Narrative  . Not on file   Social Determinants of Health   Financial Resource Strain: Low Risk   . Difficulty of Paying Living Expenses: Not hard at all  Food Insecurity: No Food Insecurity  . Worried About Charity fundraiser in the Last Year: Never true  . Ran Out of Food in the Last Year: Never true  Transportation Needs: No Transportation Needs  . Lack of Transportation (Medical): No  . Lack of Transportation (Non-Medical): No  Physical Activity: Sufficiently Active  . Days of Exercise per Week: 5 days  . Minutes of Exercise per Session: 30 min  Stress: No Stress Concern Present  . Feeling of Stress : Only a little  Social Connections:   . Frequency of Communication with Friends and Family:   . Frequency of Social Gatherings with Friends and Family:   . Attends Religious  Services:   . Active Member of Clubs or Organizations:   . Attends Archivist Meetings:   Marland Kitchen Marital Status:      Review of Systems: A 12 point ROS discussed and pertinent positives are indicated in the HPI above.  All other systems are negative.  Review of Systems  Constitutional: Positive for activity change, appetite change, fatigue and unexpected weight change.  Respiratory: Positive for shortness of breath. Negative for cough.   Cardiovascular: Negative for chest pain.  Gastrointestinal: Positive for abdominal pain.  Neurological: Positive for weakness.  Psychiatric/Behavioral: Negative for behavioral problems and confusion.    Vital Signs: BP 107/77   Pulse (!) 110   Temp (!) 97.5 F (36.4 C) (Tympanic)   Ht 6' 5.5" (1.969 m)   Wt 236 lb (107 kg)   SpO2 95%   BMI 27.63 kg/m   Physical Exam Vitals reviewed.  Constitutional:      Appearance: He is ill-appearing.  Cardiovascular:     Rate and Rhythm: Normal rate and regular rhythm.     Heart sounds: Normal heart sounds.  Pulmonary:     Effort: Pulmonary effort is normal.     Breath sounds: Normal breath sounds.  Abdominal:     Tenderness: There is abdominal tenderness.  Musculoskeletal:        General: Normal range of motion.  Skin:    General: Skin is warm and dry.  Neurological:     Mental Status: He is alert and oriented to person, place, and time.  Psychiatric:        Behavior: Behavior normal.     Imaging: DG Chest 2 View  Result Date: 03/01/2020 CLINICAL DATA:  Chest pain EXAM: CHEST - 2 VIEW COMPARISON:  May 03, 2019 FINDINGS: There is mild bibasilar atelectasis. Lungs otherwise are clear. The heart size and pulmonary vascularity are normal. No adenopathy. There are foci left anterior descending coronary artery calcification. There is aortic atherosclerosis. There is degenerative change in the thoracic spine. There is a loop recorder on the left anteriorly. IMPRESSION: Bibasilar atelectasis.   Lungs otherwise clear.  Heart size normal. Coronary artery calcification noted. Aortic Atherosclerosis (ICD10-I70.0). Loop recorder on left anteriorly. Electronically Signed   By: Lowella Grip III M.D.   On: 03/01/2020 10:23   CT Angio Chest PE W/Cm &/Or Wo Cm  Result Date: 03/01/2020 CLINICAL DATA:  Shortness of breath. Concern for pulmonary embolism. Positive D-dimer. EXAM: CT ANGIOGRAPHY CHEST WITH  CONTRAST TECHNIQUE: Multidetector CT imaging of the chest was performed using the standard protocol during bolus administration of intravenous contrast. Multiplanar CT image reconstructions and MIPs were obtained to evaluate the vascular anatomy. CONTRAST:  67mL OMNIPAQUE IOHEXOL 350 MG/ML SOLN COMPARISON:  None. FINDINGS: Cardiovascular: Contrast injection is sufficient to demonstrate satisfactory opacification of the pulmonary arteries to the segmental level. There is no pulmonary embolus. The main pulmonary artery is within normal limits for size. There is no CT evidence of acute right heart strain. There are atherosclerotic changes of the visualized thoracic aorta. The thoracic aorta is borderline aneurysmal measuring approximately 4 cm in diameter. Heart size is coronary artery calcifications are noted. Mediastinum/Nodes: --there are mildly enlarged mediastinal lymph nodes. For example there is a right paratracheal lymph node measuring approximately 1.5 cm (axial series 5, image 52). --No axillary lymphadenopathy. --No supraclavicular lymphadenopathy. --Normal thyroid gland. --The esophagus is unremarkable Lungs/Pleura: There is no pneumothorax. Atelectasis is noted at the lung bases. There is no significant pleural effusion. There is a nodule along the major fissure on the left favored to represent a small lymph node. Upper Abdomen: There are innumerable low-attenuation masses throughout the patient's liver. There are few mildly enlarged lymph nodes in the upper abdomen. Musculoskeletal: No chest wall  abnormality. No acute or significant osseous findings. Review of the MIP images confirms the above findings. IMPRESSION: 1. No acute pulmonary embolism. 2. There are innumerable masses in the patient's liver concerning for metastatic disease, currently with an unknown primary. 3. There are mildly enlarged mediastinal lymph nodes which may be reactive or metastatic. Attention on follow-up examinations is recommended. 4. Bibasilar atelectasis is noted. Aortic Atherosclerosis (ICD10-I70.0). Electronically Signed   By: Constance Holster M.D.   On: 03/01/2020 19:03   CT ABDOMEN PELVIS W CONTRAST  Result Date: 03/01/2020 CLINICAL DATA:  Chest CTA with findings suspicious for hepatic metastatic disease. EXAM: CT ABDOMEN AND PELVIS WITH CONTRAST TECHNIQUE: Multidetector CT imaging of the abdomen and pelvis was performed using the standard protocol following bolus administration of intravenous contrast. CONTRAST:  30mL OMNIPAQUE IOHEXOL 300 MG/ML  SOLN COMPARISON:  Chest CTA earlier today. Abdominopelvic CT 08/17/2018 FINDINGS: Lower chest: Assessed on chest CT earlier today. Right greater than left basilar atelectasis. Pleural thickening without significant effusion. Hepatobiliary: Innumerable low-density lesions scattered throughout the liver parenchyma most consistent with diffuse hepatic metastatic disease. Majority of the lesions are 1-2 cm. Largest lesion in the right hepatic dome measures 2.4 cm. Gallbladder physiologically distended, no calcified stone. No biliary dilatation. Pancreas: No ductal dilatation or inflammation. No evidence of pancreatic mass. Spleen: Normal in size without focal abnormality. Adrenals/Urinary Tract: Mild thickening of the lateral left adrenal gland is unchanged from prior exam. The right adrenal gland is normal. There is no suspicious adrenal nodule. No hydronephrosis. There is symmetric bilateral perinephric edema. There is a 3.3 cm exophytic low-density lesion from the posterior  left kidney that likely represents a minimally complex cyst. This is not significantly changed in size from prior exam. Excreted IV contrast within both renal collecting systems which limits assessment for stone. Probable parapelvic cyst in the left kidney. There is excreted IV contrast within the urinary bladder without evidence of bladder wall thickening or focal lesion. Stomach/Bowel: Detailed bowel evaluation is limited in the absence of enteric contrast. Mild wall thickening of the distal esophagus. Unopacified stomach is unremarkable. Normal positioning of the ligament of Treitz. Small duodenal diverticulum. No small bowel inflammation or obstruction. No obvious small bowel mass. Normal appendix. Moderate  volume of stool throughout the colon. Diverticulosis from the splenic flexure distally. No diverticulitis. Few areas of nondistention involving the sigmoid colon likely related to peristalsis, no obvious colonic mass. Vascular/Lymphatic: There is a 12 mm portal caval node, series 3, image 24. Suspected 14 mm retrocrural node adjacent to the distal esophagus, series 3, image 15. clustered prominent nodes adjacent to the celiac axis measuring up to 10 mm, series 3, image 22. 8 mm SMA node, series 3, image 28. There is left periaortic retroperitoneal adenopathy, with nodes measuring up to 13 mm, series 3, image 37. No definite enlarged pelvic lymph nodes. Ectatic infrarenal aorta measuring 2.7 cm. Aortic atherosclerosis. The portal vein is patent. Reproductive: Prostate is unremarkable. Other: Trace fluid adjacent to the inferior liver tip and edema in the right pericolic gutter. No significant ascites. No omental thickening or nodularity. No free air. Prior left inguinal hernia repair. There is fat in the right inguinal canal. Musculoskeletal: No blastic osseous lesion. No evidence of destructive lytic lesion. Multilevel degenerative change in the spine. No suspicious subcutaneous or abdominal wall lesion.  IMPRESSION: 1. Innumerable low-density lesions scattered throughout the liver parenchyma most consistent with diffuse hepatic metastatic disease. 2. Retroperitoneal and upper abdominal adenopathy, suspicious for metastatic disease. 3. Primary malignancy is not definitively characterized, however there is wall thickening of the distal esophagus which raises concern for occult esophageal primary. Recommend further evaluation with endoscopy. 4. Colonic diverticulosis without diverticulitis. Areas of nondistention of the sigmoid colon without dominant colonic mass. 5. Ectatic abdominal aorta at risk for aneurysm development, maximal dimension 2.7 cm. Recommend followup by ultrasound in 5 years. This recommendation follows ACR consensus guidelines: White Paper of the ACR Incidental Findings Committee II on Vascular Findings. J Am Coll Radiol 2013; 10:789-794. Aortic aneurysm NOS (ICD10-I71.9) Aortic Atherosclerosis (ICD10-I70.0). Electronically Signed   By: Keith Rake M.D.   On: 03/01/2020 21:30    Labs:  CBC: Recent Labs    05/03/19 1245 05/04/19 0119 05/05/19 0302 03/01/20 1411  WBC 8.9 8.3 9.2 8.9  HGB 15.1 13.3 13.7 13.8  HCT 44.7 38.2* 40.1 41.8  PLT 259 210 240 296    COAGS: No results for input(s): INR, APTT in the last 8760 hours.  BMP: Recent Labs    05/03/19 1245 05/04/19 0119 05/05/19 0302 03/01/20 1411  NA 136 133* 134* 137  K 4.2 3.7 3.7 4.4  CL 102 100 101 101  CO2 23 23 22 26   GLUCOSE 97 138* 119* 100*  BUN 12 13 9 12   CALCIUM 9.3 8.2* 8.4* 8.8*  CREATININE 0.86 0.88 0.87 0.86  GFRNONAA >60 >60 >60 >60  GFRAA >60 >60 >60 >60    LIVER FUNCTION TESTS: Recent Labs    06/28/19 0833 03/01/20 1411  BILITOT 0.6 0.8  AST 17 39  ALT 18 48*  ALKPHOS 84 134*  PROT 7.0 6.9  ALBUMIN 4.2 3.3*    TUMOR MARKERS: No results for input(s): AFPTM, CEA, CA199, CHROMGRNA in the last 8760 hours.  Assessment and Plan:  New abd pain; myalgias and SOB Work up included  CTA-- neg for PE CT revealing liver lesions and Lymphadenopathy Scheduled now for liver lesion biopsy Off Plavix 5 days Risks and benefits of liver lesion biopsy was discussed with the patient and/or patient's family including, but not limited to bleeding, infection, damage to adjacent structures or low yield requiring additional tests.  All of the questions were answered and there is agreement to proceed. Consent signed and in chart.  Thank you for this interesting consult.  I greatly enjoyed meeting Charles Mccoy and look forward to participating in their care.  A copy of this report was sent to the requesting provider on this date.  Electronically Signed: Lavonia Drafts, PA-C 03/20/2020, 11:45 AM   I spent a total of  30 Minutes   in face to face in clinical consultation, greater than 50% of which was counseling/coordinating care for liver lesion bx

## 2020-03-20 NOTE — Progress Notes (Signed)
Pt was brought to room by WC, pt pulse is 111 bpm even after watching for 5 minutes, states he is also SOB. Spoke with Jannifer Franklin PA IR/ updated. No further instructions.

## 2020-03-20 NOTE — Progress Notes (Addendum)
Patient ID: Charles Mccoy, male   DOB: Jun 14, 1951, 69 y.o.   MRN: [id number]   Pt now resting-- comfortable  HR now 96 Wbc repeat 17 INR 1.7  EKG: Normal sinus rhythm Left axis deviation Low voltage QRS Inferior infarct , age undetermined Cannot rule out Anterior infarct , age undetermined Abnormal ECG  CXR:  IMPRESSION: Bibasilar atelectasis. No edema or airspace opacity. Stable cardiac silhouette.  Discussed with Dr Kathlene Cote  Will not move forward today with Liver lesion bx with these abnormal labs Need INR wnl and Wbc wnl  Call into Silvio Clayman NP  She will let me know plan for pt  Pt is aware and agreeable  ADDENDUM:  Spoke to Cira Rue NP Pt is to see her at 815 in am Will re evaluate and let us know Will reschedule asap  Pt is aware and agreeable

## 2020-03-20 NOTE — Telephone Encounter (Signed)
Returned call to patients wife and let her know that per Cira Rue NP patient can eat tomorrow before lab appointment.

## 2020-03-20 NOTE — Progress Notes (Signed)
  I reported to IR RN that pt was tachy at 110  Pt seems in NAD to me But he did report to [facility] that he has felt SOB recently  RN discussed with Dr Kathlene Cote   Now ordering CXR; EKG  RN in [facility] aware

## 2020-03-20 NOTE — Progress Notes (Signed)
Patient ID: Charles Mccoy, male   DOB: 31-Oct-1950, 69 y.o.   MRN: [id number]  SOB has been now for approx 3-4 weeks since ED visit abd pain persists per pt  HR now 98--99  CXR pending result EKG pending  Dr Kathlene Cote aware of pt  Awaiting all results before deciding on moving ahead with Bx today  Pt is aware

## 2020-03-20 NOTE — Telephone Encounter (Signed)
Scheduled per 6/14 sch message. Unable to reach pt. Left voicemail- appts added 6/15.

## 2020-03-21 ENCOUNTER — Other Ambulatory Visit: Payer: Self-pay | Admitting: Nurse Practitioner

## 2020-03-21 ENCOUNTER — Encounter: Payer: Self-pay | Admitting: Nurse Practitioner

## 2020-03-21 ENCOUNTER — Other Ambulatory Visit: Payer: Self-pay

## 2020-03-21 ENCOUNTER — Inpatient Hospital Stay: Payer: Medicare Other

## 2020-03-21 ENCOUNTER — Inpatient Hospital Stay (HOSPITAL_BASED_OUTPATIENT_CLINIC_OR_DEPARTMENT_OTHER): Payer: Medicare Other | Admitting: Nurse Practitioner

## 2020-03-21 VITALS — BP 123/72 | HR 93 | Temp 98.4°F | Resp 22 | Wt 235.2 lb

## 2020-03-21 DIAGNOSIS — D72829 Elevated white blood cell count, unspecified: Secondary | ICD-10-CM | POA: Diagnosis not present

## 2020-03-21 DIAGNOSIS — R791 Abnormal coagulation profile: Secondary | ICD-10-CM | POA: Diagnosis not present

## 2020-03-21 DIAGNOSIS — R160 Hepatomegaly, not elsewhere classified: Secondary | ICD-10-CM

## 2020-03-21 DIAGNOSIS — J441 Chronic obstructive pulmonary disease with (acute) exacerbation: Secondary | ICD-10-CM

## 2020-03-21 DIAGNOSIS — D649 Anemia, unspecified: Secondary | ICD-10-CM

## 2020-03-21 LAB — CBC WITH DIFFERENTIAL (CANCER CENTER ONLY)
Abs Immature Granulocytes: 0.11 10*3/uL — ABNORMAL HIGH (ref 0.00–0.07)
Basophils Absolute: 0.1 10*3/uL (ref 0.0–0.1)
Basophils Relative: 0 %
Eosinophils Absolute: 0.2 10*3/uL (ref 0.0–0.5)
Eosinophils Relative: 1 %
HCT: 38.1 % — ABNORMAL LOW (ref 39.0–52.0)
Hemoglobin: 12.9 g/dL — ABNORMAL LOW (ref 13.0–17.0)
Immature Granulocytes: 1 %
Lymphocytes Relative: 3 %
Lymphs Abs: 0.6 10*3/uL — ABNORMAL LOW (ref 0.7–4.0)
MCH: 25.6 pg — ABNORMAL LOW (ref 26.0–34.0)
MCHC: 33.9 g/dL (ref 30.0–36.0)
MCV: 75.7 fL — ABNORMAL LOW (ref 80.0–100.0)
Monocytes Absolute: 1.7 10*3/uL — ABNORMAL HIGH (ref 0.1–1.0)
Monocytes Relative: 10 %
Neutro Abs: 15.4 10*3/uL — ABNORMAL HIGH (ref 1.7–7.7)
Neutrophils Relative %: 85 %
Platelet Count: 200 10*3/uL (ref 150–400)
RBC: 5.03 MIL/uL (ref 4.22–5.81)
RDW: 14.6 % (ref 11.5–15.5)
WBC Count: 18.1 10*3/uL — ABNORMAL HIGH (ref 4.0–10.5)
nRBC: 0 % (ref 0.0–0.2)

## 2020-03-21 LAB — PROTIME-INR
INR: 1.4 — ABNORMAL HIGH (ref 0.8–1.2)
Prothrombin Time: 17 seconds — ABNORMAL HIGH (ref 11.4–15.2)

## 2020-03-21 MED ORDER — PHYTONADIONE 5 MG PO TABS
2.5000 mg | ORAL_TABLET | Freq: Once | ORAL | Status: AC
  Administered 2020-03-21: 2.5 mg via ORAL
  Filled 2020-03-21: qty 1

## 2020-03-21 MED ORDER — PREDNISONE 10 MG PO TABS: ORAL_TABLET | ORAL | 0 refills | Status: DC

## 2020-03-21 MED ORDER — AZITHROMYCIN 250 MG PO TABS: ORAL_TABLET | ORAL | 0 refills | Status: DC

## 2020-03-21 MED ORDER — PHYTONADIONE 5 MG PO TABS: 20.0000 mg | ORAL_TABLET | Freq: Once | ORAL | Status: DC

## 2020-03-21 NOTE — Progress Notes (Signed)
[place]   [phone] [phone]   Clinic Follow up Note   Patient Care Team: [name], [name], MD as PCP - General (Internal Medicine) Evans Lance, MD as PCP - Electrophysiology (Cardiology) Nahser, Wonda Cheng, MD as PCP - Cardiology (Cardiology) Jonnie Finner, RN as Oncology Nurse Navigator Truitt Merle, MD as Consulting Physician (Hematology) [name], NP as Nurse Practitioner (Nurse Practitioner) 03/21/2020  CHIEF COMPLAINT: Cough, liver masses pending further work up   CURRENT THERAPY: Pending work up and final diagnosis   INTERVAL HISTORY: [name] returns after liver biopsy attempt was aborted on 03/20/20 due to tachycardia, elevated INR and leukocytosis. He reports worsening cough and dyspnea, continues to have low grade fevers since ED visit on 5/26. He feels like he has sputum but can't produce. Cough suppressant helped a little. Denies chills. Denies chest pain. Denies covid19 exposure. He has been vaccinated. He remains off plavix and aspirin. He sees an allergist for COPD, last exacerbation was in 05/2019 he was given steroids and antibiotic. He is compliant with his respiratory med regimen.     MEDICAL HISTORY:  Past Medical History:  Diagnosis Date  . ALLERGIC RHINITIS 06/19/2007  . ASTHMA 08/17/2008  . Back pain 05/2016  . CAD (coronary artery disease)    05/03/19- NSTEMI, 05/04/19 DES to LAD  . COPD (chronic obstructive pulmonary disease) ([place])   . GERD (gastroesophageal reflux disease)   . Hyperlipidemia   . HYPERTENSION 06/19/2007  . OSTEOARTHRITIS 10/09/2009   wrists, knee.  . Pneumonia    hx of   . SINUSITIS 09/15/2007   recent issuses 2 weeks ago-clearing.  . Stroke [facility])     SURGICAL HISTORY: Past Surgical History:  Procedure Laterality Date  . CARDIAC CATHETERIZATION    . CORONARY STENT INTERVENTION N/A 05/04/2019   Procedure: CORONARY STENT INTERVENTION;  Surgeon: Leonie Man, MD;   Location: [place] [facility];  Service: Cardiovascular;  Laterality: N/A;  . cyst removed from neck      age 20   . KNEE ARTHROSCOPY     2 on left knee , 1 on right  . LEFT HEART CATH AND CORONARY ANGIOGRAPHY N/A 05/04/2019   Procedure: LEFT HEART CATH AND CORONARY ANGIOGRAPHY;  Surgeon: Leonie Man, MD;  Location: [place] [facility];  Service: Cardiovascular;  Laterality: N/A;  . LOOP RECORDER INSERTION N/A 01/28/2018   Procedure: LOOP RECORDER INSERTION;  Surgeon: Evans Lance, MD;  Location: [place] [facility];  Service: Cardiovascular;  Laterality: N/A;  . TEE WITHOUT CARDIOVERSION N/A 01/28/2018   Procedure: TRANSESOPHAGEAL ECHOCARDIOGRAM (TEE);  Surgeon: Acie Fredrickson Wonda Cheng, MD;  Location: [facility] ENDOSCOPY;  Service: Cardiovascular;  Laterality: N/A;  . TOTAL KNEE ARTHROPLASTY Left 07/21/2014   Procedure: LEFT TOTAL KNEE ARTHROPLASTY;  Surgeon: Johnn Hai, MD;  Location: [facility];  Service: Orthopedics;  Laterality: Left;  . TOTAL KNEE ARTHROPLASTY Right 05/30/2016   Procedure: RIGHT TOTAL KNEE ARTHROPLASTY REPLACEMENT;  Surgeon: Susa Day, MD;  Location: [facility];  Service: Orthopedics;  Laterality: Right;    I have reviewed the social history and family history with the patient and they are unchanged from previous note.  ALLERGIES:  is allergic to erythromycin base.  MEDICATIONS:  Current Outpatient Medications  Medication Sig Dispense Refill  . albuterol (VENTOLIN HFA) 108 (90 Base) MCG/ACT inhaler Inhale 1 puff into the lungs every 4 (four) hours as needed for wheezing or shortness of breath. Use as directed    .  amLODipine (NORVASC) 10 MG tablet TAKE 1 TABLET BY MOUTH EVERY DAY IN THE MORNING (Patient taking differently: Take 10 mg by mouth daily. ) 90 tablet 1  . atorvastatin (LIPITOR) 40 MG tablet Take 1 tablet (40 mg total) by mouth daily. 90 tablet 3  . azelastine (ASTELIN) 0.1 % nasal spray Place 1 spray into both nostrils as needed for allergies.   5  .  chlorpheniramine-HYDROcodone (TUSSIONEX) 10-8 MG/5ML SUER Take 5 mLs by mouth every 12 (twelve) hours as needed for cough. 140 mL 0  . fexofenadine (ALLEGRA) 60 MG tablet Take 60 mg by mouth daily as needed for allergies.     . Fluticasone-Salmeterol 232-14 MCG/ACT AEPB Inhale 1 puff into the lungs 2 (two) times daily.    . montelukast (SINGULAIR) 10 MG tablet Take 10 mg by mouth every morning.    . nitroGLYCERIN (NITROSTAT) 0.4 MG SL tablet Place 1 tablet (0.4 mg total) under the tongue every 5 (five) minutes as needed for chest pain. 25 tablet 0  . oxyCODONE (OXY IR/ROXICODONE) 5 MG immediate release tablet Take 1 tablet (5 mg total) by mouth every 4 (four) hours as needed for severe pain. 30 tablet 0  . pantoprazole (PROTONIX) 40 MG tablet TAKE 1 TABLET BY MOUTH EVERY DAY (Patient taking differently: Take 40 mg by mouth daily. ) 90 tablet 1  . spironolactone (ALDACTONE) 25 MG tablet TAKE 1 TABLET BY MOUTH EVERY DAY (Patient taking differently: Take 25 mg by mouth daily. ) 90 tablet 1  . aspirin 81 MG EC tablet Take 1 tablet (81 mg total) by mouth daily. (Patient not taking: Reported on 03/16/2020) 90 tablet 3  . azithromycin (ZITHROMAX Z-PAK) 250 MG tablet Take as prescribed for package instructions 6 each 0  . clopidogrel (PLAVIX) 75 MG tablet Take 1 tablet (75 mg total) by mouth daily. (Patient not taking: Reported on 03/16/2020) 90 tablet 3  . predniSONE (DELTASONE) 10 MG tablet Take 4 tablets once daily for 1 day, then take 3 tablets for 1 day, then take 2 tablets for 1 day, then take 1 tablet 10 tablet 0   No current facility-administered medications for this visit.    PHYSICAL EXAMINATION:  Vitals:   03/21/20 0819  BP: 123/72  Pulse: 93  Resp: (!) 22  Temp: 98.4 F (36.9 C)  SpO2: 98%   Filed Weights   03/21/20 0819  Weight: 235 lb 3.2 oz (106.7 kg)    GENERAL:alert, no distress and comfortable SKIN: no rash  EYES: sclera clear LUNGS: clear with normal breathing effort, no  wheezes  HEART: regular rate & rhythm, no lower extremity edema NEURO: alert & oriented x 3 with fluent speech  LABORATORY DATA:  I have reviewed the data as listed CBC Latest Ref Rng & Units 03/21/2020 03/20/2020 03/20/2020  WBC 4.0 - 10.5 K/uL 18.1(H) 16.9(H) 20.1(H)  Hemoglobin 13.0 - 17.0 g/dL 12.9(L) 12.7(L) 14.2  Hematocrit 39 - 52 % 38.1(L) 38.4(L) 43.0  Platelets 150 - 400 K/uL 200 201 274     CMP Latest Ref Rng & Units 03/01/2020 06/28/2019 05/05/2019  Glucose 70 - 99 mg/dL 100(H) - 119(H)  BUN 8 - 23 mg/dL 12 - 9  Creatinine 0.61 - 1.24 mg/dL 0.86 - 0.87  Sodium 135 - 145 mmol/L 137 - 134(L)  Potassium 3.5 - 5.1 mmol/L 4.4 - 3.7  Chloride 98 - 111 mmol/L 101 - 101  CO2 22 - 32 mmol/L 26 - 22  Calcium 8.9 - 10.3 mg/dL 8.8(L) -  8.4(L)  Total Protein 6.5 - 8.1 g/dL 6.9 7.0 -  Total Bilirubin 0.3 - 1.2 mg/dL 0.8 0.6 -  Alkaline Phos 38 - 126 U/L 134(H) 84 -  AST 15 - 41 U/L 39 17 -  ALT 0 - 44 U/L 48(H) 18 -      RADIOGRAPHIC STUDIES: I have personally reviewed the radiological images as listed and agreed with the findings in the report. DG Chest 2 View  Result Date: 03/20/2020 CLINICAL DATA:  Chest pain and shortness of breath EXAM: CHEST - 2 VIEW COMPARISON:  Mar 01, 2016 FINDINGS: There is bibasilar atelectatic change. The lungs elsewhere are clear. The heart size and pulmonary vascularity are normal. No adenopathy. There is aortic atherosclerosis. Loop recorder present on the left anteriorly. There is degenerative change in the thoracic spine. IMPRESSION: Bibasilar atelectasis. No edema or airspace opacity. Stable cardiac silhouette. Aortic Atherosclerosis (ICD10-I70.0). Electronically Signed   By: Lowella Grip III M.D.   On: 03/20/2020 13:11     ASSESSMENT & PLAN: 69 yo male with   1. Cough, low grade fever, leukocytosis, COPD -Maintained on fluticasone-almeterol 2 puffs daily, singulair, and ventolin PRN -He developed a dry cough, exertional dyspnea, and low  grade fever after ED visit on 5/26, no sputum production -We reviewed that his imaging shows wall thickening in the distal esophagus, unclear if this is contributing to his cough -At his initial consult on 6/7, his cough had improved with symptom management at home including mucinex and using essential oil diffusers. We recommended for him to see his allergist who manages COPD. -We gave him tussionex which helps -No wheezing on exam; No effusion or consolidation on recent CTA chest.  -he had tachycardia and dyspnea in IR before liver biopsy on 6/14. CXR showed bibasilar atelectasis, no edema or airspace opacity. EKG showed sinus rhythm. INR was elevated to 1.7 likely due to coagulopathy from liver masses. Biopsy was aborted -Mr. Sauceda appears uncomfortable but stable today. He has worsening cough and dyspnea, no sputum. HR remains normal. Labs confirm leukocytosis and anemia which is new for him. We will check iron studies. -etiology is likely multifactorial. Will treat for possible COPD exacerbation with prednisone taper and Zpak.  -For elevated INR he was given a dose of oral vitamin K 2.5 mg today, INR at 1.4 prior to vit K -We will repeat labs on 6/17. If trend improves we will reschedule his liver biopsy. Since we do not have a date yet, he will restart aspirin and plavix today.  -I reviewed the plan with Dr. Burr Medico and notified Monia Sabal, PA in IR  2. Multiple liver masses, concerning for malignancy  -He presented with arthralgia and fatigue, initially thought to be related to high-dose statin. Due to prior h/o NSTEMI, he presented to ED for further eval. Work up showed was negative for ACS or PE but did show multiple liver masses, mediastinal and RP adenopathy, and wall thickening of the distal esophagus. The clinical picture is concerning for malignancy.  -We have referred him for liver biopsy to confirm malignancy and determine the primary site and to confirm metastatic disease. This was  aborted on 03/20/20 when he was found to have tachycardia and dyspnea as well as leukocytosis and elevated INR when he presented to IR. Reschedule pending CBC normalization and stable VS.  -If liver biopsy is negative or inconclusive, we recommend for him to proceed with endoscopy per GI Dr. Henrene Pastor. He met with him on 6/10 and discussed the procedure  if needed.  -F/u after liver biopsy to discuss diagnosis, prognosis and finalize a treatment plan.  -He has RUQ and epigastric pain currently managed with oxycodone PRN. He has expected constipation that has improved.  -Currently he is able to maintain adequate nutrition and hydration with stable weight, denies dysphagia or  odynophagia.  -F/u after liver biopsy   3. H/o NSTEMI, CAD, CVA, HTN, HL -he had CVA in 2008, no residual weakness or deficits  -s/p MI in 04/2019, cardiac cath showed severe LAD stenosis treated with DES. Moderate disease in the circumflex. Echo showed LVEF 65% without significant valve disease. Discharged on aspirin and brilinta which was changed to plavix in 11/2019 due to cost. No BB due to nocturnal bradycardia  -continues plavix, aspirin, lipitor, amlodipine, aldactone per PCP and cardiology  -Continue f/u per cardiology Dr. Lauree Chandler. He has been off plavix and ASA for a week for liver biopsy. Since it is being rescheduled with no date yet, OK to resume plavix and aspirin for now -He had tachycardia and dyspnea in IR before liver biopsy on 6/14, EKG showed sinus rhythm. Resolved today.  PLAN: -Labs reviewed -2.5 mg po vitamin K today in clinic -Treat for COPD exacerbation with prednisone taper and Zpak -Restart plavix and aspirin today -Repeat CBC and INR 6/17 or 6/18 -Reschedule liver biopsy if WBC trends down and VS stable, goal INR for biopsy is 1.5  -Return to [facility] for repeat labs and f/u day prior to biopsy, will arrange once scheduled  -Will cc to Monia Sabal and Dr. Kathlene Cote   No problem-specific  Assessment & Plan notes found for this encounter.   Orders Placed This Encounter  Procedures  . CBC with Differential (Cancer Center Only)    Standing Status:   Future    Number of Occurrences:   1    Standing Expiration Date:   03/21/2021  . CBC with Differential (Cancer Center Only)    Standing Status:   Future    Standing Expiration Date:   03/21/2021  . Protime-INR    Standing Status:   Future    Standing Expiration Date:   03/21/2021  . Iron and TIBC    Standing Status:   Future    Standing Expiration Date:   03/21/2021  . Ferritin    Standing Status:   Future    Standing Expiration Date:   03/21/2021  . CMP ([place] only)    Standing Status:   Future    Standing Expiration Date:   03/21/2021   All questions were answered. The patient knows to call the clinic with any problems, questions or concerns. No barriers to learning were detected. Total encounter time was 40 minutes     [name], NP 03/21/20

## 2020-03-21 NOTE — Progress Notes (Signed)
Spoke with MD Burr Medico and  she would like to give patient 2.5 mg of Vitamin K.   Larene Beach, PharmD

## 2020-03-23 ENCOUNTER — Encounter: Payer: Self-pay | Admitting: *Deleted

## 2020-03-23 ENCOUNTER — Encounter (HOSPITAL_COMMUNITY): Payer: Self-pay

## 2020-03-23 ENCOUNTER — Inpatient Hospital Stay (HOSPITAL_COMMUNITY): Payer: Medicare Other

## 2020-03-23 ENCOUNTER — Inpatient Hospital Stay: Payer: Medicare Other

## 2020-03-23 ENCOUNTER — Telehealth: Payer: Self-pay | Admitting: Nurse Practitioner

## 2020-03-23 ENCOUNTER — Ambulatory Visit: Payer: Medicare Other | Admitting: Nurse Practitioner

## 2020-03-23 ENCOUNTER — Inpatient Hospital Stay (HOSPITAL_COMMUNITY)
Admission: EM | Admit: 2020-03-23 | Discharge: 2020-03-29 | DRG: 435 | Disposition: A | Discharge status: Hospice | Payer: Medicare Other | Source: Ambulatory Visit | Attending: Internal Medicine | Admitting: Internal Medicine

## 2020-03-23 ENCOUNTER — Emergency Department (HOSPITAL_COMMUNITY): Payer: Medicare Other

## 2020-03-23 ENCOUNTER — Other Ambulatory Visit: Payer: Medicare Other

## 2020-03-23 ENCOUNTER — Other Ambulatory Visit: Payer: Self-pay

## 2020-03-23 DIAGNOSIS — E872 Acidosis: Secondary | ICD-10-CM | POA: Diagnosis present

## 2020-03-23 DIAGNOSIS — R600 Localized edema: Secondary | ICD-10-CM | POA: Diagnosis not present

## 2020-03-23 DIAGNOSIS — Z515 Encounter for palliative care: Secondary | ICD-10-CM | POA: Diagnosis not present

## 2020-03-23 DIAGNOSIS — C801 Malignant (primary) neoplasm, unspecified: Secondary | ICD-10-CM | POA: Diagnosis present

## 2020-03-23 DIAGNOSIS — Z20822 Contact with and (suspected) exposure to covid-19: Secondary | ICD-10-CM | POA: Diagnosis present

## 2020-03-23 DIAGNOSIS — Z882 Allergy status to sulfonamides status: Secondary | ICD-10-CM | POA: Diagnosis not present

## 2020-03-23 DIAGNOSIS — K7200 Acute and subacute hepatic failure without coma: Secondary | ICD-10-CM | POA: Diagnosis present

## 2020-03-23 DIAGNOSIS — Z7982 Long term (current) use of aspirin: Secondary | ICD-10-CM

## 2020-03-23 DIAGNOSIS — I639 Cerebral infarction, unspecified: Secondary | ICD-10-CM | POA: Diagnosis not present

## 2020-03-23 DIAGNOSIS — R933 Abnormal findings on diagnostic imaging of other parts of digestive tract: Secondary | ICD-10-CM

## 2020-03-23 DIAGNOSIS — R5381 Other malaise: Secondary | ICD-10-CM | POA: Diagnosis not present

## 2020-03-23 DIAGNOSIS — D696 Thrombocytopenia, unspecified: Secondary | ICD-10-CM | POA: Diagnosis present

## 2020-03-23 DIAGNOSIS — R7989 Other specified abnormal findings of blood chemistry: Secondary | ICD-10-CM | POA: Diagnosis not present

## 2020-03-23 DIAGNOSIS — D684 Acquired coagulation factor deficiency: Secondary | ICD-10-CM | POA: Diagnosis present

## 2020-03-23 DIAGNOSIS — Z7189 Other specified counseling: Secondary | ICD-10-CM

## 2020-03-23 DIAGNOSIS — R12 Heartburn: Secondary | ICD-10-CM | POA: Diagnosis present

## 2020-03-23 DIAGNOSIS — J90 Pleural effusion, not elsewhere classified: Secondary | ICD-10-CM

## 2020-03-23 DIAGNOSIS — Z66 Do not resuscitate: Secondary | ICD-10-CM | POA: Diagnosis not present

## 2020-03-23 DIAGNOSIS — R180 Malignant ascites: Secondary | ICD-10-CM

## 2020-03-23 DIAGNOSIS — G893 Neoplasm related pain (acute) (chronic): Secondary | ICD-10-CM | POA: Diagnosis not present

## 2020-03-23 DIAGNOSIS — D72829 Elevated white blood cell count, unspecified: Secondary | ICD-10-CM | POA: Diagnosis present

## 2020-03-23 DIAGNOSIS — Z8673 Personal history of transient ischemic attack (TIA), and cerebral infarction without residual deficits: Secondary | ICD-10-CM | POA: Diagnosis not present

## 2020-03-23 DIAGNOSIS — K769 Liver disease, unspecified: Secondary | ICD-10-CM

## 2020-03-23 DIAGNOSIS — Z955 Presence of coronary angioplasty implant and graft: Secondary | ICD-10-CM | POA: Diagnosis not present

## 2020-03-23 DIAGNOSIS — G8929 Other chronic pain: Secondary | ICD-10-CM | POA: Diagnosis present

## 2020-03-23 DIAGNOSIS — E875 Hyperkalemia: Secondary | ICD-10-CM | POA: Diagnosis present

## 2020-03-23 DIAGNOSIS — I69318 Other symptoms and signs involving cognitive functions following cerebral infarction: Secondary | ICD-10-CM | POA: Diagnosis not present

## 2020-03-23 DIAGNOSIS — Z87891 Personal history of nicotine dependence: Secondary | ICD-10-CM | POA: Diagnosis not present

## 2020-03-23 DIAGNOSIS — M2550 Pain in unspecified joint: Secondary | ICD-10-CM | POA: Diagnosis not present

## 2020-03-23 DIAGNOSIS — E785 Hyperlipidemia, unspecified: Secondary | ICD-10-CM | POA: Diagnosis present

## 2020-03-23 DIAGNOSIS — I252 Old myocardial infarction: Secondary | ICD-10-CM

## 2020-03-23 DIAGNOSIS — R932 Abnormal findings on diagnostic imaging of liver and biliary tract: Secondary | ICD-10-CM | POA: Diagnosis not present

## 2020-03-23 DIAGNOSIS — I10 Essential (primary) hypertension: Secondary | ICD-10-CM | POA: Diagnosis present

## 2020-03-23 DIAGNOSIS — C7800 Secondary malignant neoplasm of unspecified lung: Secondary | ICD-10-CM | POA: Diagnosis not present

## 2020-03-23 DIAGNOSIS — Z79899 Other long term (current) drug therapy: Secondary | ICD-10-CM

## 2020-03-23 DIAGNOSIS — R188 Other ascites: Secondary | ICD-10-CM

## 2020-03-23 DIAGNOSIS — R160 Hepatomegaly, not elsewhere classified: Secondary | ICD-10-CM

## 2020-03-23 DIAGNOSIS — Z7902 Long term (current) use of antithrombotics/antiplatelets: Secondary | ICD-10-CM | POA: Diagnosis not present

## 2020-03-23 DIAGNOSIS — D649 Anemia, unspecified: Secondary | ICD-10-CM

## 2020-03-23 DIAGNOSIS — I2583 Coronary atherosclerosis due to lipid rich plaque: Secondary | ICD-10-CM | POA: Diagnosis not present

## 2020-03-23 DIAGNOSIS — J441 Chronic obstructive pulmonary disease with (acute) exacerbation: Secondary | ICD-10-CM | POA: Diagnosis present

## 2020-03-23 DIAGNOSIS — D630 Anemia in neoplastic disease: Secondary | ICD-10-CM | POA: Diagnosis present

## 2020-03-23 DIAGNOSIS — M549 Dorsalgia, unspecified: Secondary | ICD-10-CM | POA: Diagnosis present

## 2020-03-23 DIAGNOSIS — E222 Syndrome of inappropriate secretion of antidiuretic hormone: Secondary | ICD-10-CM | POA: Diagnosis present

## 2020-03-23 DIAGNOSIS — E871 Hypo-osmolality and hyponatremia: Secondary | ICD-10-CM | POA: Diagnosis not present

## 2020-03-23 DIAGNOSIS — Z950 Presence of cardiac pacemaker: Secondary | ICD-10-CM

## 2020-03-23 DIAGNOSIS — R7401 Elevation of levels of liver transaminase levels: Secondary | ICD-10-CM | POA: Diagnosis not present

## 2020-03-23 DIAGNOSIS — I2510 Atherosclerotic heart disease of native coronary artery without angina pectoris: Secondary | ICD-10-CM | POA: Diagnosis not present

## 2020-03-23 DIAGNOSIS — N281 Cyst of kidney, acquired: Secondary | ICD-10-CM | POA: Diagnosis present

## 2020-03-23 DIAGNOSIS — N400 Enlarged prostate without lower urinary tract symptoms: Secondary | ICD-10-CM | POA: Diagnosis not present

## 2020-03-23 DIAGNOSIS — R17 Unspecified jaundice: Secondary | ICD-10-CM | POA: Diagnosis not present

## 2020-03-23 DIAGNOSIS — J449 Chronic obstructive pulmonary disease, unspecified: Secondary | ICD-10-CM | POA: Diagnosis not present

## 2020-03-23 DIAGNOSIS — R531 Weakness: Secondary | ICD-10-CM

## 2020-03-23 DIAGNOSIS — C787 Secondary malignant neoplasm of liver and intrahepatic bile duct: Principal | ICD-10-CM

## 2020-03-23 DIAGNOSIS — R52 Pain, unspecified: Secondary | ICD-10-CM

## 2020-03-23 DIAGNOSIS — Z6828 Body mass index (BMI) 28.0-28.9, adult: Secondary | ICD-10-CM | POA: Diagnosis not present

## 2020-03-23 DIAGNOSIS — J9811 Atelectasis: Secondary | ICD-10-CM | POA: Diagnosis not present

## 2020-03-23 DIAGNOSIS — K219 Gastro-esophageal reflux disease without esophagitis: Secondary | ICD-10-CM | POA: Diagnosis present

## 2020-03-23 DIAGNOSIS — N179 Acute kidney failure, unspecified: Secondary | ICD-10-CM | POA: Diagnosis present

## 2020-03-23 DIAGNOSIS — K7290 Hepatic failure, unspecified without coma: Secondary | ICD-10-CM | POA: Diagnosis not present

## 2020-03-23 DIAGNOSIS — C799 Secondary malignant neoplasm of unspecified site: Secondary | ICD-10-CM | POA: Diagnosis not present

## 2020-03-23 DIAGNOSIS — Z96653 Presence of artificial knee joint, bilateral: Secondary | ICD-10-CM | POA: Diagnosis present

## 2020-03-23 DIAGNOSIS — Z7401 Bed confinement status: Secondary | ICD-10-CM | POA: Diagnosis not present

## 2020-03-23 DIAGNOSIS — Z741 Need for assistance with personal care: Secondary | ICD-10-CM | POA: Diagnosis not present

## 2020-03-23 DIAGNOSIS — R0600 Dyspnea, unspecified: Secondary | ICD-10-CM

## 2020-03-23 DIAGNOSIS — R21 Rash and other nonspecific skin eruption: Secondary | ICD-10-CM | POA: Diagnosis present

## 2020-03-23 DIAGNOSIS — K7689 Other specified diseases of liver: Secondary | ICD-10-CM | POA: Diagnosis not present

## 2020-03-23 LAB — CBC WITH DIFFERENTIAL (CANCER CENTER ONLY)
Abs Immature Granulocytes: 0.13 10*3/uL — ABNORMAL HIGH (ref 0.00–0.07)
Basophils Absolute: 0 10*3/uL (ref 0.0–0.1)
Basophils Relative: 0 %
Eosinophils Absolute: 0 10*3/uL (ref 0.0–0.5)
Eosinophils Relative: 0 %
HCT: 38.7 % — ABNORMAL LOW (ref 39.0–52.0)
Hemoglobin: 13.2 g/dL (ref 13.0–17.0)
Immature Granulocytes: 1 %
Lymphocytes Relative: 3 %
Lymphs Abs: 0.7 10*3/uL (ref 0.7–4.0)
MCH: 25.9 pg — ABNORMAL LOW (ref 26.0–34.0)
MCHC: 34.1 g/dL (ref 30.0–36.0)
MCV: 76 fL — ABNORMAL LOW (ref 80.0–100.0)
Monocytes Absolute: 2 10*3/uL — ABNORMAL HIGH (ref 0.1–1.0)
Monocytes Relative: 10 %
Neutro Abs: 17.8 10*3/uL — ABNORMAL HIGH (ref 1.7–7.7)
Neutrophils Relative %: 86 %
Platelet Count: 226 10*3/uL (ref 150–400)
RBC: 5.09 MIL/uL (ref 4.22–5.81)
RDW: 15.2 % (ref 11.5–15.5)
WBC Count: 20.7 10*3/uL — ABNORMAL HIGH (ref 4.0–10.5)
nRBC: 0 % (ref 0.0–0.2)

## 2020-03-23 LAB — CMP (CANCER CENTER ONLY)
ALT: 252 U/L — ABNORMAL HIGH (ref 0–44)
AST: 231 U/L (ref 15–41)
Albumin: 2.2 g/dL — ABNORMAL LOW (ref 3.5–5.0)
Alkaline Phosphatase: 742 U/L — ABNORMAL HIGH (ref 38–126)
Anion gap: 13 (ref 5–15)
BUN: 39 mg/dL — ABNORMAL HIGH (ref 8–23)
CO2: 21 mmol/L — ABNORMAL LOW (ref 22–32)
Calcium: 7.9 mg/dL — ABNORMAL LOW (ref 8.9–10.3)
Chloride: 89 mmol/L — ABNORMAL LOW (ref 98–111)
Creatinine: 1.54 mg/dL — ABNORMAL HIGH (ref 0.61–1.24)
GFR, Est AFR Am: 53 mL/min — ABNORMAL LOW (ref >60)
GFR, Estimated: 46 mL/min — ABNORMAL LOW (ref >60)
Glucose, Bld: 101 mg/dL — ABNORMAL HIGH (ref 70–99)
Potassium: 4.8 mmol/L (ref 3.5–5.1)
Sodium: 123 mmol/L — ABNORMAL LOW (ref 135–145)
Total Bilirubin: 5.5 mg/dL (ref 0.3–1.2)
Total Protein: 6.3 g/dL — ABNORMAL LOW (ref 6.5–8.1)

## 2020-03-23 LAB — BASIC METABOLIC PANEL
Anion gap: 14 (ref 5–15)
Anion gap: 15 (ref 5–15)
BUN: 45 mg/dL — ABNORMAL HIGH (ref 8–23)
BUN: 42 mg/dL — ABNORMAL HIGH (ref 8–23)
CO2: 21 mmol/L — ABNORMAL LOW (ref 22–32)
CO2: 20 mmol/L — ABNORMAL LOW (ref 22–32)
Calcium: 7.6 mg/dL — ABNORMAL LOW (ref 8.9–10.3)
Calcium: 7.5 mg/dL — ABNORMAL LOW (ref 8.9–10.3)
Chloride: 87 mmol/L — ABNORMAL LOW (ref 98–111)
Chloride: 90 mmol/L — ABNORMAL LOW (ref 98–111)
Creatinine, Ser: 1.66 mg/dL — ABNORMAL HIGH (ref 0.61–1.24)
Creatinine, Ser: 1.5 mg/dL — ABNORMAL HIGH (ref 0.61–1.24)
GFR calc Af Amer: 49 mL/min — ABNORMAL LOW (ref >60)
GFR calc Af Amer: 55 mL/min — ABNORMAL LOW (ref >60)
GFR calc non Af Amer: 42 mL/min — ABNORMAL LOW (ref >60)
GFR calc non Af Amer: 47 mL/min — ABNORMAL LOW (ref >60)
Glucose, Bld: 105 mg/dL — ABNORMAL HIGH (ref 70–99)
Glucose, Bld: 96 mg/dL (ref 70–99)
Potassium: 5 mmol/L (ref 3.5–5.1)
Potassium: 5.4 mmol/L — ABNORMAL HIGH (ref 3.5–5.1)
Sodium: 122 mmol/L — ABNORMAL LOW (ref 135–145)
Sodium: 125 mmol/L — ABNORMAL LOW (ref 135–145)

## 2020-03-23 LAB — CBC WITH DIFFERENTIAL/PLATELET
Abs Immature Granulocytes: 0.11 10*3/uL — ABNORMAL HIGH (ref 0.00–0.07)
Basophils Absolute: 0 10*3/uL (ref 0.0–0.1)
Basophils Relative: 0 %
Eosinophils Absolute: 0 10*3/uL (ref 0.0–0.5)
Eosinophils Relative: 0 %
HCT: 37.8 % — ABNORMAL LOW (ref 39.0–52.0)
Hemoglobin: 12.7 g/dL — ABNORMAL LOW (ref 13.0–17.0)
Immature Granulocytes: 1 %
Lymphocytes Relative: 3 %
Lymphs Abs: 0.6 10*3/uL — ABNORMAL LOW (ref 0.7–4.0)
MCH: 26.2 pg (ref 26.0–34.0)
MCHC: 33.6 g/dL (ref 30.0–36.0)
MCV: 77.9 fL — ABNORMAL LOW (ref 80.0–100.0)
Monocytes Absolute: 1.3 10*3/uL — ABNORMAL HIGH (ref 0.1–1.0)
Monocytes Relative: 8 %
Neutro Abs: 14.9 10*3/uL — ABNORMAL HIGH (ref 1.7–7.7)
Neutrophils Relative %: 88 %
Platelets: 199 10*3/uL (ref 150–400)
RBC: 4.85 MIL/uL (ref 4.22–5.81)
RDW: 15.2 % (ref 11.5–15.5)
WBC: 16.9 10*3/uL — ABNORMAL HIGH (ref 4.0–10.5)
nRBC: 0 % (ref 0.0–0.2)

## 2020-03-23 LAB — IRON AND TIBC
Iron: 23 ug/dL — ABNORMAL LOW (ref 42–163)
Saturation Ratios: 14 % — ABNORMAL LOW (ref 20–55)
TIBC: 168 ug/dL — ABNORMAL LOW (ref 202–409)
UIBC: 144 ug/dL (ref 117–376)

## 2020-03-23 LAB — HEPATIC FUNCTION PANEL
ALT: 221 U/L — ABNORMAL HIGH (ref 0–44)
AST: 209 U/L — ABNORMAL HIGH (ref 15–41)
Albumin: 2.3 g/dL — ABNORMAL LOW (ref 3.5–5.0)
Alkaline Phosphatase: 614 U/L — ABNORMAL HIGH (ref 38–126)
Bilirubin, Direct: 3.4 mg/dL — ABNORMAL HIGH (ref 0.0–0.2)
Indirect Bilirubin: 2 mg/dL — ABNORMAL HIGH (ref 0.3–0.9)
Total Bilirubin: 5.4 mg/dL — ABNORMAL HIGH (ref 0.3–1.2)
Total Protein: 6.2 g/dL — ABNORMAL LOW (ref 6.5–8.1)

## 2020-03-23 LAB — URINALYSIS, ROUTINE W REFLEX MICROSCOPIC
Bilirubin Urine: NEGATIVE
Glucose, UA: NEGATIVE mg/dL
Hgb urine dipstick: NEGATIVE
Ketones, ur: NEGATIVE mg/dL
Leukocytes,Ua: NEGATIVE
Nitrite: NEGATIVE
Protein, ur: NEGATIVE mg/dL
Specific Gravity, Urine: 1.015 (ref 1.005–1.030)
pH: 5 (ref 5.0–8.0)

## 2020-03-23 LAB — PROTIME-INR
INR: 1.7 — ABNORMAL HIGH (ref 0.8–1.2)
Prothrombin Time: 19 seconds — ABNORMAL HIGH (ref 11.4–15.2)

## 2020-03-23 LAB — LACTIC ACID, PLASMA
Lactic Acid, Venous: 3.6 mmol/L (ref 0.5–1.9)
Lactic Acid, Venous: 3.6 mmol/L (ref 0.5–1.9)

## 2020-03-23 LAB — FERRITIN: Ferritin: 11196 ng/mL — ABNORMAL HIGH (ref 24–336)

## 2020-03-23 LAB — LACTATE DEHYDROGENASE: LDH: 647 U/L — ABNORMAL HIGH (ref 98–192)

## 2020-03-23 LAB — TROPONIN I (HIGH SENSITIVITY): Troponin I (High Sensitivity): 17 ng/L (ref <18)

## 2020-03-23 LAB — LIPASE, BLOOD: Lipase: 35 U/L (ref 11–51)

## 2020-03-23 LAB — AMMONIA: Ammonia: 35 umol/L (ref 9–35)

## 2020-03-23 MED ORDER — PANTOPRAZOLE SODIUM 40 MG PO TBEC
40.0000 mg | DELAYED_RELEASE_TABLET | Freq: Every day | ORAL | Status: DC
  Administered 2020-03-24 – 2020-03-28 (×5): 40 mg via ORAL
  Filled 2020-03-23 (×5): qty 1

## 2020-03-23 MED ORDER — MONTELUKAST SODIUM 10 MG PO TABS
10.0000 mg | ORAL_TABLET | Freq: Every day | ORAL | Status: DC
  Administered 2020-03-24 – 2020-03-28 (×5): 10 mg via ORAL
  Filled 2020-03-23 (×5): qty 1

## 2020-03-23 MED ORDER — AZITHROMYCIN 250 MG PO TABS
250.0000 mg | ORAL_TABLET | Freq: Every day | ORAL | Status: AC
  Administered 2020-03-24 – 2020-03-25 (×2): 250 mg via ORAL
  Filled 2020-03-23 (×2): qty 1

## 2020-03-23 MED ORDER — ASPIRIN EC 81 MG PO TBEC
81.0000 mg | DELAYED_RELEASE_TABLET | Freq: Every day | ORAL | Status: DC
  Administered 2020-03-24 – 2020-03-29 (×6): 81 mg via ORAL
  Filled 2020-03-23 (×6): qty 1

## 2020-03-23 MED ORDER — SODIUM CHLORIDE 0.9 % IV BOLUS
1000.0000 mL | Freq: Once | INTRAVENOUS | Status: AC
  Administered 2020-03-23: 1000 mL via INTRAVENOUS

## 2020-03-23 MED ORDER — ALBUTEROL SULFATE (2.5 MG/3ML) 0.083% IN NEBU: 2.5000 mg | INHALATION_SOLUTION | Freq: Four times a day (QID) | RESPIRATORY_TRACT | Status: DC | PRN

## 2020-03-23 MED ORDER — PREDNISONE 20 MG PO TABS
20.0000 mg | ORAL_TABLET | Freq: Every day | ORAL | Status: DC
  Administered 2020-03-24 – 2020-03-27 (×4): 20 mg via ORAL
  Filled 2020-03-23 (×4): qty 1

## 2020-03-23 MED ORDER — FLUTICASONE-SALMETEROL 232-14 MCG/ACT IN AEPB: 1.0000 | INHALATION_SPRAY | Freq: Two times a day (BID) | RESPIRATORY_TRACT | Status: DC

## 2020-03-23 MED ORDER — OXYCODONE HCL 5 MG PO TABS
5.0000 mg | ORAL_TABLET | ORAL | Status: DC | PRN
  Administered 2020-03-25 – 2020-03-26 (×2): 5 mg via ORAL
  Filled 2020-03-23 (×2): qty 1

## 2020-03-23 MED ORDER — CLOPIDOGREL BISULFATE 75 MG PO TABS
75.0000 mg | ORAL_TABLET | Freq: Every evening | ORAL | Status: DC
  Administered 2020-03-24: 75 mg via ORAL
  Filled 2020-03-23 (×2): qty 1

## 2020-03-23 MED ORDER — SPIRONOLACTONE 25 MG PO TABS
25.0000 mg | ORAL_TABLET | Freq: Every day | ORAL | Status: DC
  Administered 2020-03-24 – 2020-03-26 (×3): 25 mg via ORAL
  Filled 2020-03-23 (×4): qty 1

## 2020-03-23 MED ORDER — ALBUTEROL SULFATE HFA 108 (90 BASE) MCG/ACT IN AERS: 1.0000 | INHALATION_SPRAY | RESPIRATORY_TRACT | Status: DC | PRN

## 2020-03-23 MED ORDER — SODIUM CHLORIDE (PF) 0.9 % IJ SOLN
INTRAMUSCULAR | Status: AC
  Filled 2020-03-23: qty 50

## 2020-03-23 MED ORDER — FUROSEMIDE 10 MG/ML IJ SOLN
40.0000 mg | Freq: Once | INTRAMUSCULAR | Status: AC
  Administered 2020-03-24: 40 mg via INTRAVENOUS
  Filled 2020-03-23: qty 4

## 2020-03-23 MED ORDER — AMLODIPINE BESYLATE 10 MG PO TABS
10.0000 mg | ORAL_TABLET | Freq: Every day | ORAL | Status: DC
  Administered 2020-03-24 – 2020-03-27 (×4): 10 mg via ORAL
  Filled 2020-03-23 (×4): qty 1

## 2020-03-23 MED ORDER — FLUTICASONE FUROATE-VILANTEROL 200-25 MCG/INH IN AEPB
1.0000 | INHALATION_SPRAY | Freq: Every day | RESPIRATORY_TRACT | Status: DC
  Administered 2020-03-24 – 2020-03-29 (×6): 1 via RESPIRATORY_TRACT
  Filled 2020-03-23: qty 28

## 2020-03-23 MED ORDER — IOHEXOL 300 MG/ML  SOLN
100.0000 mL | Freq: Once | INTRAMUSCULAR | Status: AC | PRN
  Administered 2020-03-23: 100 mL via INTRAVENOUS

## 2020-03-23 NOTE — Progress Notes (Signed)
CRITICAL VALUE STICKER  CRITICAL VALUE: T.bili 5.5 and AST 231  RECEIVER (on-site recipient of call): Samara Stankowski,RN  DATE & TIME NOTIFIED: 03/23/20 @ 1501  MESSENGER (representative from lab): Ulice Dash  MD NOTIFIED: Cira Rue  TIME OF NOTIFICATION:1505  RESPONSE:

## 2020-03-23 NOTE — Progress Notes (Signed)
Report received from [place] and pt received to room 1323 via stretcher and ambulated to bed. Pt educated on use of call bell and given pt handbook

## 2020-03-23 NOTE — Telephone Encounter (Signed)
Called and spoke to Mrs. Cory regarding patient's labs today, showing AKI, transaminitis, hyperbilirubinemia, and leukocytosis (infection vs prednisone). She notes he is very sleepy and in pain today. I recommend for her to bring him to [facility] ED immediately for further work up and likely hospital admission. She understands and appreciates the call. I informed [facility] ED charge.   Cira Rue, NP

## 2020-03-23 NOTE — ED Notes (Signed)
Per cancer center, states he had labs drawn today-concerning for AKI-history of liver cancer/metz

## 2020-03-23 NOTE — ED Notes (Signed)
Patient transported to CT 

## 2020-03-23 NOTE — H&P (Signed)
History and Physical    Charles Mccoy RKY:[id number] DOB: 1951-01-14 DOA: 03/23/2020  PCP: [name], [name], MD  Patient coming from: Home, accompanied by wife Charles Mccoy at bedside  I have personally briefly reviewed patient's old medical records in [place]  Chief Complaint: Abnormal labs sent by oncology office  HPI: Charles Mccoy is a 69 y.o. male with medical history significant for COPD, CVA, CAD s/p stent, hypertension, hyperlipidemia and recent liver lesions who presents at the recommendation of his oncologist for transaminitis and hyperbilirubinemia.  Previously presented to the ED on 5/26 with complaints of right upper quadrant/chest pain.  Initially there was concern for PE and he has CTA which showed no PE but multiple liver masses.  He was then refer to oncology and initially had a planned liver biopsy on 6/15 but this had to be discontinued due to tachycardia, dyspnea and elevated INR.  He was started on azithromycin and steroids for presumed COPD exacerbation and also received a dose of oral vitamin K for his elevated INR.  Today, he had a repeat lab work with oncology and was asked to present to the ED after he was noted to have persistent leukocytosis, worsening transaminitis and hyperbilirubinemia.  Patient reports that ever since his ED evaluation in May he has been feeling like "something sitting on his chest" and continues to have sharp pain to his right upper quadrant worse with deep respiration.  He also notes that his temperature seems to be more elevated around the afternoon and evening times.  Last had a true fever of 100.9 about a week ago.  He also has noticed more dry cough but states that the azithromycin and steroid he received has been helping.  He denies any diarrhea, nausea or vomiting.  Has had decreased p.o. intake but tries to eat snacks throughout the day.  Drinks about 4-5 bottles of water.  He denies any abdominal swelling but has felt bloated.   Denies any lower extremity swelling.  Wife noticed he has been more forgetful in the past several days but not necessarily confused.  Patient has a former history of tobacco use but quit about 25 years ago.  States he drinks about 1-2 beers daily but has not had any ever since diagnosis of the liver masses.  Family history is significant for father who could be laryngeal cancer and was a smoker.   ED Course:  He was afebrile, normotensive on room air.   Labs notable for persistent leukocytosis of 16.9 mild anemia with hemoglobin of 12.7.  Lactate of 3.6. Sodium of 122, creatinine of 1.66 from prior normal.  BUN of 45. AST of 209 from prior normal, ALT of 221 from prior 48.  Alkaline phosphatase of 614.  Total bilirubin of 5.5 with elevated direct bilirubin of 3.4 and indirect bilirubin elevated at 2.  LDH 647.  CT abdomen pelvis showed new ascites and lack of gallbladder wall enhancement and suggested gallbladder ultrasound.  There is also multifocal hepatic metastatic lesion now more confluent and enlarging.  There is interval development of right-sided effusion and bibasilar airspace disease.  Review of Systems:  Constitutional: No Weight Change, No Fever ENT/Mouth: No sore throat, No Rhinorrhea Eyes: No Eye Pain, No Vision Changes Cardiovascular: + Chest Pain, + SOB, No PND,+ Dyspnea on Exertion, No Orthopnea,  No Edema, No Palpitations Respiratory: No Cough, No Sputum, No Wheezing, no Dyspnea  Gastrointestinal: No Nausea, No Vomiting, No Diarrhea, No Constipation, + Pain Genitourinary: no Urinary Incontinence Musculoskeletal:  No Arthralgias, No Myalgias Skin: No Skin Lesions, No Pruritus, Neuro: no Weakness, No Numbness Psych: No Anxiety/Panic, No Depression, + decrease appetite Heme/Lymph: No Bruising, No Bleeding  Past Medical History:  Diagnosis Date  . ALLERGIC RHINITIS 06/19/2007  . ASTHMA 08/17/2008  . Back pain 05/2016  . CAD (coronary artery disease)    05/03/19- NSTEMI,  05/04/19 DES to LAD  . COPD (chronic obstructive pulmonary disease) ([place])   . GERD (gastroesophageal reflux disease)   . Hyperlipidemia   . HYPERTENSION 06/19/2007  . OSTEOARTHRITIS 10/09/2009   wrists, knee.  . Pneumonia    hx of   . SINUSITIS 09/15/2007   recent issuses 2 weeks ago-clearing.  . Stroke [facility])     Past Surgical History:  Procedure Laterality Date  . CARDIAC CATHETERIZATION    . CORONARY STENT INTERVENTION N/A 05/04/2019   Procedure: CORONARY STENT INTERVENTION;  Surgeon: Leonie Man, MD;  Location: [place] [facility];  Service: Cardiovascular;  Laterality: N/A;  . cyst removed from neck      age 39   . KNEE ARTHROSCOPY     2 on left knee , 1 on right  . LEFT HEART CATH AND CORONARY ANGIOGRAPHY N/A 05/04/2019   Procedure: LEFT HEART CATH AND CORONARY ANGIOGRAPHY;  Surgeon: Leonie Man, MD;  Location: [place] [facility];  Service: Cardiovascular;  Laterality: N/A;  . LOOP RECORDER INSERTION N/A 01/28/2018   Procedure: LOOP RECORDER INSERTION;  Surgeon: Evans Lance, MD;  Location: [place] [facility];  Service: Cardiovascular;  Laterality: N/A;  . TEE WITHOUT CARDIOVERSION N/A 01/28/2018   Procedure: TRANSESOPHAGEAL ECHOCARDIOGRAM (TEE);  Surgeon: Acie Fredrickson Wonda Cheng, MD;  Location: [facility] ENDOS[facility];  Service: Cardiovascular;  Laterality: N/A;  . TOTAL KNEE ARTHROPLASTY Left 07/21/2014   Procedure: LEFT TOTAL KNEE ARTHROPLASTY;  Surgeon: Johnn Hai, MD;  Location: [facility];  Service: Orthopedics;  Laterality: Left;  . TOTAL KNEE ARTHROPLASTY Right 05/30/2016   Procedure: RIGHT TOTAL KNEE ARTHROPLASTY REPLACEMENT;  Surgeon: Susa Day, MD;  Location: [facility];  Service: Orthopedics;  Laterality: Right;     reports that he quit smoking about 18 years ago. His smoking use included cigarettes. He has never used smokeless tobacco. He reports previous alcohol use of about 6.0 standard drinks of alcohol per week. He reports that he does not use drugs.  Allergies  Allergen  Reactions  . Erythromycin Base Other (See Comments)    REACTION: stomach irritation, patient is unaware of this allergy    Family History  Problem Relation Age of Onset  . Cancer Father        laryngeal   . Colon cancer Neg Hx   . Esophageal cancer Neg Hx   . Rectal cancer Neg Hx   . Stomach cancer Neg Hx      Prior to Admission medications   Medication Sig Start Date End Date Taking? Authorizing Provider  albuterol (VENTOLIN HFA) 108 (90 Base) MCG/ACT inhaler Inhale 1 puff into the lungs every 4 (four) hours as needed for wheezing or shortness of breath. Use as directed 05/11/19  Yes [provider]  amLODipine (NORVASC) 10 MG tablet TAKE 1 TABLET BY MOUTH EVERY DAY IN THE MORNING Patient taking differently: Take 10 mg by mouth daily.  12/01/19  Yes [name], [name], MD  aspirin 81 MG EC tablet Take 1 tablet (81 mg total) by mouth daily. 05/17/19  Yes Daune Perch, NP  atorvastatin (LIPITOR) 40 MG tablet Take 1 tablet (40 mg  total) by mouth daily. 02/07/20  Yes [name], MD  azelastine (ASTELIN) 0.1 % nasal spray Place 1 spray into both nostrils as needed for allergies.  01/13/18  Yes [provider]  azithromycin (ZITHROMAX Z-PAK) 250 MG tablet Take as prescribed for package instructions Patient taking differently: Take 250-500 mg by mouth as directed. Take as prescribed for package instructions. Day 1: Take 2 tablets (500 mg) Then Days 2-5: Take 1 tablet Daily 03/21/20  Yes [name], NP  chlorpheniramine-HYDROcodone (TUSSIONEX) 10-8 MG/5ML SUER Take 5 mLs by mouth every 12 (twelve) hours as needed for cough. 03/14/20  Yes [name], NP  clopidogrel (PLAVIX) 75 MG tablet Take 1 tablet (75 mg total) by mouth daily. Patient taking differently: Take 75 mg by mouth every evening.  11/08/19  Yes [name], MD  fexofenadine (ALLEGRA) 60 MG tablet Take 60 mg by mouth daily as needed for allergies.    Yes [provider]    Fluticasone-Salmeterol 232-14 MCG/ACT AEPB Inhale 1 puff into the lungs 2 (two) times daily.   Yes [provider]  montelukast (SINGULAIR) 10 MG tablet Take 10 mg by mouth every morning. 05/12/19  Yes [provider]  nitroGLYCERIN (NITROSTAT) 0.4 MG SL tablet Place 1 tablet (0.4 mg total) under the tongue every 5 (five) minutes as needed for chest pain. 05/05/19  Yes Furth, Cadence H, PA-C  oxyCODONE (OXY IR/ROXICODONE) 5 MG immediate release tablet Take 1 tablet (5 mg total) by mouth every 4 (four) hours as needed for severe pain. 03/03/20  Yes [name], [name], MD  pantoprazole (PROTONIX) 40 MG tablet TAKE 1 TABLET BY MOUTH EVERY DAY Patient taking differently: Take 40 mg by mouth daily.  11/09/19  Yes [name], [name], MD  predniSONE (DELTASONE) 10 MG tablet Take 4 tablets once daily for 1 day, then take 3 tablets for 1 day, then take 2 tablets for 1 day, then take 1 tablet 03/21/20  Yes [name], NP  spironolactone (ALDACTONE) 25 MG tablet TAKE 1 TABLET BY MOUTH EVERY DAY Patient taking differently: Take 25 mg by mouth daily.  12/01/19  Yes [name], [name], MD  atorvastatin (LIPITOR) 80 MG tablet SMARTSIG:1 Tablet(s) By Mouth Every Evening Patient not taking: Reported on 03/23/2020 02/26/20   [provider]    Physical Exam: Vitals:   03/23/20 1608 03/23/20 1611 03/23/20 1857  BP: 104/75  116/72  Pulse: 92  78  Resp: 18  17  Temp: 98.1 F (36.7 C)    TempSrc: Oral    SpO2: 94%  94%  Weight:  107.5 kg   Height:  6' 3.5" (1.918 m)     Constitutional: NAD, calm, comfortable, jaundiced but nontoxic appearing elderly male laying flat in bed Vitals:   03/23/20 1608 03/23/20 1611 03/23/20 1857  BP: 104/75  116/72  Pulse: 92  78  Resp: 18  17  Temp: 98.1 F (36.7 C)    TempSrc: Oral    SpO2: 94%  94%  Weight:  107.5 kg   Height:  6' 3.5" (1.918 m)    Eyes: PERRL, lids and conjunctivae normal ENMT: Mucous membranes are  moist.  Neck: normal, supple Respiratory: clear to auscultation bilaterally, no wheezing, no crackles. Normal respiratory effort. No accessory muscle use.  Cardiovascular: Regular rate and rhythm, no murmurs / rubs / gallops. No extremity edema.   Abdomen: Mild tenderness to right upper quadrant, no masses palpated.  No rebound tenderness, rigidity  or guarding. no hepatosplenomegaly. Bowel sounds positive.  No asterixis.  No noted telangiectasia on skin. Musculoskeletal: no clubbing / cyanosis. No joint deformity upper and lower extremities. Good ROM, no contractures. Normal muscle tone.  Skin: no rashes, lesions, ulcers. No induration Neurologic: CN 2-12 grossly intact. Sensation intact. Strength 5/5 in all 4.  Psychiatric: Normal judgment and insight. Alert and oriented x 3. Normal mood.     Labs on Admission: I have personally reviewed following labs and imaging studies  CBC: Recent Labs  Lab 03/20/20 1115 03/20/20 1322 03/21/20 0852 03/23/20 1339 03/23/20 1852  WBC 20.1* 16.9* 18.1* 20.7* 16.9*  NEUTROABS  --   --  15.4* 17.8* 14.9*  HGB 14.2 12.7* 12.9* 13.2 12.7*  HCT 43.0 38.4* 38.1* 38.7* 37.8*  MCV 79.5* 79.0* 75.7* 76.0* 77.9*  PLT 274 201 200 226 357   Basic Metabolic Panel: Recent Labs  Lab 03/23/20 1339 03/23/20 1852  NA 123* 122*  K 4.8 5.0  CL 89* 87*  CO2 21* 21*  GLUCOSE 101* 105*  BUN 39* 45*  CREATININE 1.54* 1.66*  CALCIUM 7.9* 7.6*   GFR: Estimated Creatinine Clearance: 57.7 mL/min (A) (by C-G formula based on SCr of 1.66 mg/dL (H)). Liver Function Tests: Recent Labs  Lab 03/23/20 1339 03/23/20 1852  AST 231* 209*  ALT 252* 221*  ALKPHOS 742* 614*  BILITOT 5.5* 5.4*  PROT 6.3* 6.2*  ALBUMIN 2.2* 2.3*   Recent Labs  Lab 03/23/20 1852  LIPASE 35   No results for input(s): AMMONIA in the last 168 hours. Coagulation Profile: Recent Labs  Lab 03/20/20 1115 03/21/20 0750 03/23/20 1340  INR 1.7* 1.4* 1.7*   Cardiac Enzymes: No  results for input(s): CKTOTAL, CKMB, CKMBINDEX, TROPONINI in the last 168 hours. BNP (last 3 results) No results for input(s): PROBNP in the last 8760 hours. HbA1C: No results for input(s): HGBA1C in the last 72 hours. CBG: No results for input(s): GLUCAP in the last 168 hours. Lipid Profile: No results for input(s): CHOL, HDL, LDLCALC, TRIG, CHOLHDL, LDLDIRECT in the last 72 hours. Thyroid Function Tests: No results for input(s): TSH, T4TOTAL, FREET4, T3FREE, THYROIDAB in the last 72 hours. Anemia Panel: Recent Labs    03/23/20 1340  FERRITIN 11,196*  TIBC 168*  IRON 23*   Urine analysis:    Component Value Date/Time   COLORURINE AMBER (A) 03/23/2020 1943   APPEARANCEUR CLEAR 03/23/2020 1943   LABSPEC 1.015 03/23/2020 1943   PHURINE 5.0 03/23/2020 [place] 03/23/2020 [place] 03/23/2020 1943   HGBUR negative 09/05/2009 [place] 03/23/2020 1943   BILIRUBINUR neg 09/06/2016 [place] 03/23/2020 1943   PROTEINUR NEGATIVE 03/23/2020 1943   UROBILINOGEN 1.0 09/06/2016 1048   UROBILINOGEN 0.2 07/12/2014 1200   NITRITE NEGATIVE 03/23/2020 1943   LEUKOCYTESUR NEGATIVE 03/23/2020 1943    Radiological Exams on Admission: CT ABDOMEN PELVIS W CONTRAST  Result Date: 03/23/2020 CLINICAL DATA:  Abdominal distension, abdominal pain with elevated LFTs with known metastatic disease to the liver. EXAM: CT ABDOMEN AND PELVIS WITH CONTRAST TECHNIQUE: Multidetector CT imaging of the abdomen and pelvis was performed using the standard protocol following bolus administration of intravenous contrast. CONTRAST:  141mL OMNIPAQUE IOHEXOL 300 MG/ML  SOLN COMPARISON:  03/01/2020 FINDINGS: Lower chest: Interval development of RIGHT-sided effusion and basilar airspace disease since the prior study. Calcified coronary artery disease. No pericardial effusion. Heart is incompletely imaged. Minimal atelectasis at the LEFT lung base.  Hepatobiliary: Multifocal hepatic metastatic lesions now more confluent and enlarging when compared to the prior study. (Image 26, series 2) 3.5 cm lesion previously 2.9 cm. Near confluent disease in the LEFT hepatic lobe on today's exam. Area of confluent disease in the RIGHT hepatic lobe (image 39, series 2) 5.0 cm at most 3.7 cm on the prior study and without discrete intervening normal hepatic parenchyma between lesions in this area. Portal vein is patent. Gallbladder is collapsed. Question of lack of enhancement of the medial wall of the gallbladder. In the current context the significance is uncertain the gallbladder could be collapse upon itself. Signs of ascites and small pericholecystic fluid. Portal vein is patent. Pancreas: Pancreas is normal without ductal dilation or inflammation. Spleen: Spleen normal size and contour. Adrenals/Urinary Tract: Adrenal glands are normal. Symmetric renal enhancement. No hydronephrosis. LEFT renal cyst arises from lateral LEFT kidney as before. Urinary bladder is normal to the extent evaluated aside from LEFT posterolateral bladder diverticulum. Stomach/Bowel: Stomach is under distended. Small bowel without overt signs of obstruction. Mildly dilated distal small bowel loops filled with liquid stool like material. Stool and gas filling the proximal colon. Colonic diverticulosis. No diverticulitis. The appendix is normal. Vascular/Lymphatic: Calcific and noncalcific atheromatous plaque of the abdominal aorta. Mild dilation without aneurysm. 2.9 cm greatest axial dimension. Retroperitoneal adenopathy. (Image 46, series 2) 1.3 cm previously 1 cm short axis. Periportal nodal enlargement is similar. Juxta crural lymph node and hepatic gastric lymph nodes are similar. Reproductive: Prostate is unremarkable by CT. Other: Post LEFT inguinal herniorrhaphy. Ascites which is new from the prior study. No signs of free air. Musculoskeletal: Spinal degenerative changes. No acute or  destructive bone process. IMPRESSION: 1. Given new ascites and lack of gallbladder wall enhancement with collapse of the gallbladder since previous imaging studies would suggest focused assessment with gallbladder ultrasound to ensure gallbladder wall integrity. HIDA scan is likely to be un informative at this time due to hepatic dysfunction. 2. Multifocal hepatic metastatic lesions now more confluent and enlarging when compared to the prior study. 3. New small volume ascites is more likely related to worsening hepatic dysfunction in the setting of extensive hepatic metastatic disease. 4. Interval development of RIGHT-sided effusion and basilar airspace disease since the prior study. 5. Colonic diverticulosis without diverticulitis. 6. Aortic atherosclerosis. Aortic Atherosclerosis (ICD10-I70.0). Electronically Signed   By: Zetta Bills M.D.   On: 03/23/2020 20:17      Assessment/Plan  Hyperbilirubinemia/transaminitis/ascites in the setting of worsening suspected metastatic liver mass Patient had planned biopsy on 6/15 but this was discontinued due to patient being tachypneic, dyspneic and had elevated INR CT abdomen unable to visualize gallbladder wall.  Will obtain right upper quadrant ultrasound. Continue spironolactone Check ammonia given some mild confusion in the past few days will need oncology consult in the morning  Hyponatremia with AKI Suspect could be more hypovolemic and possible component of hepatorenal syndrome.   Stat repeat BMP shows worsening sodium after 1 L of fluid in the ED.  Discussed case with on-call nephrologist Dr. Moshe Cipro who recommended Lasix and follow-up in the morning.  Consider nephrology consult the morning if no improvement. Obtain urine studies CT of the abdomen shows stable left renal cyst  Questionable COPD exacerbation Suspect his symptoms are dry cough is more due to diaphragmatic irritation from ascites and now the right pleural effusion However  given that he feels improvement with steroids.  Will finish steroid taper and azithromycin Continue bronchodilators  Hypertension Continue Norvasc  History of  CAD s/p stent Continue aspirin Plavix  History of CVA Continue aspirin and Plavix  DVT prophylaxis:.SCDs Code Status: Full Family Communication: Plan discussed with patient and wife at bedside.  All concerns and questions were addressed extensively. disposition Plan: Home with at least 2 midnight stays  Consults called:  Admission status: inpatient  Status is: Inpatient  Remains inpatient appropriate because:Inpatient level of care appropriate due to severity of illness   Dispo: The patient is from: Home              Anticipated d/c is to: Home              Anticipated d/c date is: 3 days              Patient currently is not medically stable to d/c.         Orene Desanctis DO [facility]   If 7PM-7AM, please contact night-coverage [email or web address]   03/23/2020, 9:31 PM

## 2020-03-23 NOTE — ED Notes (Signed)
ED TO INPATIENT HANDOFF REPORT  ED Nurse Name and Phone #: Fredonia Highland [phone]  S Name/Age/Gender Charles Mccoy 69 y.o. male Room/Bed: WA23/WA23  Code Status   Code Status: Full Code  Home/SNF/Other Home Patient oriented to: self, place, time and situation Is this baseline? Yes   Triage Complete: Triage complete  Chief Complaint Hyperbilirubinemia [E80.6]  Triage Note Patient states he went to the [facility] today for lab work and was called and told to come to the ED. Patient has multiple abnormal labs.    Allergies Allergies  Allergen Reactions  . Erythromycin Base Other (See Comments)    REACTION: stomach irritation, patient is unaware of this allergy    Level of Care/Admitting Diagnosis ED Disposition    ED Disposition Condition [place]: [facility] [[id number]]  Level of Care: Med-Surg [16]  May admit patient to [facility] or [facility] if equivalent level of care is available:: No  Covid Evaluation: Asymptomatic Screening Protocol (No Symptoms)  Admission Type: Emergency [1]  Diagnosis: Hyperbilirubinemia [173059]  Admitting Physician: Orene Desanctis [[id number]]  Attending Physician: Orene Desanctis [[id number]]  Estimated length of stay: past midnight tomorrow  Certification:: I certify this patient will need inpatient services for at least 2 midnights       B Medical/Surgery History Past Medical History:  Diagnosis Date  . ALLERGIC RHINITIS 06/19/2007  . ASTHMA 08/17/2008  . Back pain 05/2016  . CAD (coronary artery disease)    05/03/19- NSTEMI, 05/04/19 DES to LAD  . COPD (chronic obstructive pulmonary disease) ([place])   . GERD (gastroesophageal reflux disease)   . Hyperlipidemia   . HYPERTENSION 06/19/2007  . OSTEOARTHRITIS 10/09/2009   wrists, knee.  . Pneumonia    hx of   . SINUSITIS 09/15/2007   recent issuses 2 weeks ago-clearing.  . Stroke [facility])    Past Surgical History:  Procedure Laterality Date  . CARDIAC  CATHETERIZATION    . CORONARY STENT INTERVENTION N/A 05/04/2019   Procedure: CORONARY STENT INTERVENTION;  Surgeon: Leonie Man, MD;  Location: [place] [facility] LAB;  Service: Cardiovascular;  Laterality: N/A;  . cyst removed from neck      age 49   . KNEE ARTHROSCOPY     2 on left knee , 1 on right  . LEFT HEART CATH AND CORONARY ANGIOGRAPHY N/A 05/04/2019   Procedure: LEFT HEART CATH AND CORONARY ANGIOGRAPHY;  Surgeon: Leonie Man, MD;  Location: [place] [facility];  Service: Cardiovascular;  Laterality: N/A;  . LOOP RECORDER INSERTION N/A 01/28/2018   Procedure: LOOP RECORDER INSERTION;  Surgeon: Evans Lance, MD;  Location: [place] [facility];  Service: Cardiovascular;  Laterality: N/A;  . TEE WITHOUT CARDIOVERSION N/A 01/28/2018   Procedure: TRANSESOPHAGEAL ECHOCARDIOGRAM (TEE);  Surgeon: Acie Fredrickson Wonda Cheng, MD;  Location: [facility] ENDOSCOPY;  Service: Cardiovascular;  Laterality: N/A;  . TOTAL KNEE ARTHROPLASTY Left 07/21/2014   Procedure: LEFT TOTAL KNEE ARTHROPLASTY;  Surgeon: Johnn Hai, MD;  Location: [facility] ORS;  Service: Orthopedics;  Laterality: Left;  . TOTAL KNEE ARTHROPLASTY Right 05/30/2016   Procedure: RIGHT TOTAL KNEE ARTHROPLASTY REPLACEMENT;  Surgeon: Susa Day, MD;  Location: [facility] ORS;  Service: Orthopedics;  Laterality: Right;     A IV Location/Drains/Wounds Patient Lines/Drains/Airways Status    Active Line/Drains/Airways    Name Placement date Placement time Site Days   Peripheral IV 03/23/20 Posterior;Right Forearm 03/23/20  1859  Forearm  less than 1  Incision (Closed) 07/21/14 Knee Left 07/21/14  --   2072   Incision (Closed) 05/30/16 Leg Right 05/30/16  0934   1393          Intake/Output Last 24 hours  Intake/Output Summary (Last 24 hours) at 03/23/2020 2301 Last data filed at 03/23/2020 2039 Gross per 24 hour  Intake 1000 ml  Output --  Net 1000 ml    Labs/Imaging Results for orders placed or performed during the hospital encounter of 03/23/20  (from the past 48 hour(s))  CBC with Differential/Platelet     Status: Abnormal   Collection Time: 03/23/20  6:52 PM  Result Value Ref Range   WBC 16.9 (H) 4.0 - 10.5 K/uL   RBC 4.85 4.22 - 5.81 MIL/uL   Hemoglobin 12.7 (L) 13.0 - 17.0 g/dL   HCT 37.8 (L) 39 - 52 %   MCV 77.9 (L) 80.0 - 100.0 fL   MCH 26.2 26.0 - 34.0 pg   MCHC 33.6 30.0 - 36.0 g/dL   RDW 15.2 11.5 - 15.5 %   Platelets 199 150 - 400 K/uL   nRBC 0.0 0.0 - 0.2 %   Neutrophils Relative % 88 %   Neutro Abs 14.9 (H) 1.7 - 7.7 K/uL   Lymphocytes Relative 3 %   Lymphs Abs 0.6 (L) 0.7 - 4.0 K/uL   Monocytes Relative 8 %   Monocytes Absolute 1.3 (H) 0 - 1 K/uL   Eosinophils Relative 0 %   Eosinophils Absolute 0.0 0 - 0 K/uL   Basophils Relative 0 %   Basophils Absolute 0.0 0 - 0 K/uL   Immature Granulocytes 1 %   Abs Immature Granulocytes 0.11 (H) 0.00 - 0.07 K/uL    Comment: Performed at [facility], [place] [address] [place], [place] [address]  Basic metabolic panel     Status: Abnormal   Collection Time: 03/23/20  6:52 PM  Result Value Ref Range   Sodium 122 (L) 135 - 145 mmol/L   Potassium 5.0 3.5 - 5.1 mmol/L   Chloride 87 (L) 98 - 111 mmol/L   CO2 21 (L) 22 - 32 mmol/L   Glucose, Bld 105 (H) 70 - 99 mg/dL    Comment: Glucose reference range applies only to samples taken after fasting for at least 8 hours.   BUN 45 (H) 8 - 23 mg/dL   Creatinine, Ser 1.66 (H) 0.61 - 1.24 mg/dL   Calcium 7.6 (L) 8.9 - 10.3 mg/dL   GFR calc non Af Amer 42 (L) >60 mL/min   GFR calc Af Amer 49 (L) >60 mL/min   Anion gap 14 5 - 15    Comment: Performed at [facility], [place] [address], [place], [place] [address]  Hepatic function panel     Status: Abnormal   Collection Time: 03/23/20  6:52 PM  Result Value Ref Range   Total Protein 6.2 (L) 6.5 - 8.1 g/dL   Albumin 2.3 (L) 3.5 - 5.0 g/dL   AST 209 (H) 15 - 41 U/L   ALT 221 (H) 0 - 44 U/L   Alkaline Phosphatase 614 (H) 38 - 126 U/L   Total  Bilirubin 5.4 (H) 0.3 - 1.2 mg/dL   Bilirubin, Direct 3.4 (H) 0.0 - 0.2 mg/dL   Indirect Bilirubin 2.0 (H) 0.3 - 0.9 mg/dL    Comment: Performed at [facility], [place] [address] [place], [place] [address]  Lipase, blood     Status: None   Collection  Time: 03/23/20  6:52 PM  Result Value Ref Range   Lipase 35 11 - 51 U/L    Comment: Performed at [facility], [place] [address], [place], [place] [address]  Lactate dehydrogenase     Status: Abnormal   Collection Time: 03/23/20  6:52 PM  Result Value Ref Range   LDH 647 (H) 98 - 192 U/L    Comment: Performed at [facility], [place] [address], [place], [place] [address]  Lactic acid, plasma     Status: Abnormal   Collection Time: 03/23/20  6:53 PM  Result Value Ref Range   Lactic Acid, Venous 3.6 (HH) 0.5 - 1.9 mmol/L    Comment: CRITICAL RESULT CALLED TO, READ BACK BY AND VERIFIED WITH: Santiesteban,J. RN @2002  ON 06.17.2021 BY COHEN,K Performed at [facility], [place] [address], [place], [place] [address]   Urinalysis, Routine w reflex microscopic     Status: Abnormal   Collection Time: 03/23/20  7:43 PM  Result Value Ref Range   Color, Urine AMBER (A) YELLOW    Comment: BIOCHEMICALS MAY BE AFFECTED BY COLOR   APPearance CLEAR CLEAR   Specific Gravity, Urine 1.015 1.005 - 1.030   pH 5.0 5.0 - 8.0   Glucose, UA NEGATIVE NEGATIVE mg/dL   Hgb urine dipstick NEGATIVE NEGATIVE   Bilirubin Urine NEGATIVE NEGATIVE   Ketones, ur NEGATIVE NEGATIVE mg/dL   Protein, ur NEGATIVE NEGATIVE mg/dL   Nitrite NEGATIVE NEGATIVE   Leukocytes,Ua NEGATIVE NEGATIVE    Comment: Performed at [place] [address] [place], [place] [address]   CT ABDOMEN PELVIS W CONTRAST  Result Date: 03/23/2020 CLINICAL DATA:  Abdominal distension, abdominal pain with elevated LFTs with known metastatic disease to the liver. EXAM: CT ABDOMEN AND PELVIS WITH CONTRAST TECHNIQUE:  Multidetector CT imaging of the abdomen and pelvis was performed using the standard protocol following bolus administration of intravenous contrast. CONTRAST:  154mL OMNIPAQUE IOHEXOL 300 MG/ML  SOLN COMPARISON:  03/01/2020 FINDINGS: Lower chest: Interval development of RIGHT-sided effusion and basilar airspace disease since the prior study. Calcified coronary artery disease. No pericardial effusion. Heart is incompletely imaged. Minimal atelectasis at the LEFT lung base. Hepatobiliary: Multifocal hepatic metastatic lesions now more confluent and enlarging when compared to the prior study. (Image 26, series 2) 3.5 cm lesion previously 2.9 cm. Near confluent disease in the LEFT hepatic lobe on today's exam. Area of confluent disease in the RIGHT hepatic lobe (image 39, series 2) 5.0 cm at most 3.7 cm on the prior study and without discrete intervening normal hepatic parenchyma between lesions in this area. Portal vein is patent. Gallbladder is collapsed. Question of lack of enhancement of the medial wall of the gallbladder. In the current context the significance is uncertain the gallbladder could be collapse upon itself. Signs of ascites and small pericholecystic fluid. Portal vein is patent. Pancreas: Pancreas is normal without ductal dilation or inflammation. Spleen: Spleen normal size and contour. Adrenals/Urinary Tract: Adrenal glands are normal. Symmetric renal enhancement. No hydronephrosis. LEFT renal cyst arises from lateral LEFT kidney as before. Urinary bladder is normal to the extent evaluated aside from LEFT posterolateral bladder diverticulum. Stomach/Bowel: Stomach is under distended. Small bowel without overt signs of obstruction. Mildly dilated distal small bowel loops filled with liquid stool like material. Stool and gas filling the proximal colon. Colonic diverticulosis. No diverticulitis. The appendix is normal. Vascular/Lymphatic: Calcific and noncalcific atheromatous plaque of the abdominal  aorta. Mild dilation without aneurysm.  2.9 cm greatest axial dimension. Retroperitoneal adenopathy. (Image 46, series 2) 1.3 cm previously 1 cm short axis. Periportal nodal enlargement is similar. Juxta crural lymph node and hepatic gastric lymph nodes are similar. Reproductive: Prostate is unremarkable by CT. Other: Post LEFT inguinal herniorrhaphy. Ascites which is new from the prior study. No signs of free air. Musculoskeletal: Spinal degenerative changes. No acute or destructive bone process. IMPRESSION: 1. Given new ascites and lack of gallbladder wall enhancement with collapse of the gallbladder since previous imaging studies would suggest focused assessment with gallbladder ultrasound to ensure gallbladder wall integrity. HIDA scan is likely to be un informative at this time due to hepatic dysfunction. 2. Multifocal hepatic metastatic lesions now more confluent and enlarging when compared to the prior study. 3. New small volume ascites is more likely related to worsening hepatic dysfunction in the setting of extensive hepatic metastatic disease. 4. Interval development of RIGHT-sided effusion and basilar airspace disease since the prior study. 5. Colonic diverticulosis without diverticulitis. 6. Aortic atherosclerosis. Aortic Atherosclerosis (ICD10-I70.0). Electronically Signed   By: Zetta Bills M.D.   On: 03/23/2020 20:17   US Abdomen Limited RUQ  Result Date: 03/23/2020 CLINICAL DATA:  Hepatic metastatic disease, abnormal gallbladder on CT EXAM: ULTRASOUND ABDOMEN LIMITED RIGHT UPPER QUADRANT COMPARISON:  03/23/2020 FINDINGS: Gallbladder: Not well visualized. The gallbladder was completely decompressed on preceding CT. Common bile duct: Diameter: 5 mm Liver: Numerous hypoechoic masses are seen throughout the liver parenchyma consistent with known metastatic disease. No intrahepatic duct dilation. Portal vein is patent on color Doppler imaging with normal direction of blood flow towards the liver.  Other: None. IMPRESSION: 1. Diffuse liver metastases. 2. Nonvisualization of the gallbladder due to decompressed state. Electronically Signed   By: Randa Ngo M.D.   On: 03/23/2020 22:19    Pending Labs Unresulted Labs (From admission, onward) Comment          Start     Ordered   03/24/20 0500  CBC  Tomorrow morning,   R        03/23/20 2127   03/24/20 0500  Comprehensive metabolic panel  Tomorrow morning,   R        03/23/20 2209   03/23/20 2247  Osmolality, urine  Once,   STAT        03/23/20 2246   03/23/20 2144  Hepatitis panel, acute  Once,   STAT        03/23/20 2143   03/23/20 2130  Ammonia  Once,   STAT        03/23/20 2129   03/23/20 2128  Sodium, urine, random  Once,   STAT        03/23/20 2128   03/23/20 2128  Creatinine, urine, 24 hour  Once,   STAT        03/23/20 2128   03/23/20 9381  Basic metabolic panel  ONCE - STAT,   STAT        03/23/20 2127   03/23/20 1800  Blood culture (routine x 2)  BLOOD CULTURE X 2,   STAT      03/23/20 1759   03/23/20 1759  Lactic acid, plasma  Now then every 2 hours,   STAT      03/23/20 1759          Vitals/Pain Today's Vitals   03/23/20 2252 03/23/20 2253 03/23/20 2254 03/23/20 2255  BP:      Pulse: 82 86 82 79  Resp: (!) 21 18 17  16  Temp:      TempSrc:      SpO2: 94% 94% 94% 95%  Weight:      Height:      PainSc:        Isolation Precautions No active isolations  Medications Medications  sodium chloride (PF) 0.9 % injection (  Not Given 03/23/20 1944)  furosemide (LASIX) injection 40 mg (has no administration in time range)  aspirin EC tablet 81 mg (has no administration in time range)  azithromycin (ZITHROMAX) tablet 250-500 mg (has no administration in time range)  oxyCODONE (Oxy IR/ROXICODONE) immediate release tablet 5 mg (has no administration in time range)  amLODipine (NORVASC) tablet 10 mg (has no administration in time range)  spironolactone (ALDACTONE) tablet 25 mg (has no administration in time  range)  pantoprazole (PROTONIX) EC tablet 40 mg (has no administration in time range)  clopidogrel (PLAVIX) tablet 75 mg (has no administration in time range)  albuterol (VENTOLIN HFA) 108 (90 Base) MCG/ACT inhaler 1 puff (has no administration in time range)  Fluticasone-Salmeterol 232-14 MCG/ACT AEPB 1 puff (has no administration in time range)  montelukast (SINGULAIR) tablet 10 mg (has no administration in time range)  predniSONE (DELTASONE) tablet 20 mg (has no administration in time range)  sodium chloride 0.9 % bolus 1,000 mL (0 mLs Intravenous Stopped 03/23/20 2039)  iohexol (OMNIPAQUE) 300 MG/ML solution 100 mL (100 mLs Intravenous Contrast Given 03/23/20 1920)    Mobility walks Low fall risk   Focused Assessments Gen med   R Recommendations: See Admitting Provider Note  Report given to:   Additional Notes:

## 2020-03-23 NOTE — ED Provider Notes (Signed)
[facility] DEPT Provider Note   CSN: [id number] Arrival date & time: 03/23/20  1547     History Chief Complaint  Patient presents with  . Abnormal Lab  . AKI    Charles Mccoy is a 69 y.o. male hx of CAD, COPD, who presented with elevated liver enzymes.  Patient was seen here recently for shortness of breath and had a CT PE that showed mets to the liver.  Patient had CT abdomen pelvis confirms the mets.  He was thought to have possible esophageal primary and is supposed to get a liver biopsy.  Patient has been seen by oncology multiple times.  His liver enzymes continue to get worse.  His bilirubin today is 5 and he also has mild renal insufficiency.  Patient states that he continues to have epigastric pain.  Patient denies any fevers or trouble urinating.  Denies any chest pain or shortness breath.  The history is provided by the patient.       Past Medical History:  Diagnosis Date  . ALLERGIC RHINITIS 06/19/2007  . ASTHMA 08/17/2008  . Back pain 05/2016  . CAD (coronary artery disease)    05/03/19- NSTEMI, 05/04/19 DES to LAD  . COPD (chronic obstructive pulmonary disease) ([place])   . GERD (gastroesophageal reflux disease)   . Hyperlipidemia   . HYPERTENSION 06/19/2007  . OSTEOARTHRITIS 10/09/2009   wrists, knee.  . Pneumonia    hx of   . SINUSITIS 09/15/2007   recent issuses 2 weeks ago-clearing.  . Stroke [facility])     Patient Active Problem List   Diagnosis Date Noted  . Hyperbilirubinemia 03/23/2020  . History of CVA (cerebrovascular accident) 03/23/2020  . Hyponatremia 03/23/2020  . Transaminitis 03/23/2020  . Liver lesion 03/23/2020  . CAD (coronary artery disease) 05/05/2019  . NSTEMI (non-ST elevated myocardial infarction) ([place]) 05/03/2019  . Pulmonary nodules/lesions, multiple 04/29/2019  . Right carpal tunnel syndrome 10/15/2018  . Left inguinal hernia 10/15/2018  . Cerebrovascular accident (CVA) ([place])   . Hyperlipidemia 01/20/2018    . BPH (benign prostatic hyperplasia) 01/20/2018  . COPD (chronic obstructive pulmonary disease) ([place]) 01/20/2018  . Cerebral infarction due to embolism of left middle cerebral artery ([place]) s/p tPA 01/19/2018  . Suspected stroke patient last known to be well 2 to 3 hours ago 01/18/2018  . Primary osteoarthritis of right knee 05/30/2016  . Right knee DJD 05/30/2016  . GERD (gastroesophageal reflux disease) 07/28/2014  . Left knee DJD 07/21/2014  . Osteoarthritis 10/09/2009  . Asthma 08/17/2008  . SINUSITIS 09/15/2007  . Essential hypertension 06/19/2007  . Allergic rhinitis 06/19/2007    Past Surgical History:  Procedure Laterality Date  . CARDIAC CATHETERIZATION    . CORONARY STENT INTERVENTION N/A 05/04/2019   Procedure: CORONARY STENT INTERVENTION;  Surgeon: Leonie Man, MD;  Location: [facility];  Service: Cardiovascular;  Laterality: N/A;  . cyst removed from neck      age 22   . KNEE ARTHROSCOPY     2 on left knee , 1 on right  . LEFT HEART CATH AND CORONARY ANGIOGRAPHY N/A 05/04/2019   Procedure: LEFT HEART CATH AND CORONARY ANGIOGRAPHY;  Surgeon: Leonie Man, MD;  Location: [facility];  Service: Cardiovascular;  Laterality: N/A;  . LOOP RECORDER INSERTION N/A 01/28/2018   Procedure: LOOP RECORDER INSERTION;  Surgeon: Evans Lance, MD;  Location: [facility];  Service: Cardiovascular;  Laterality: N/A;  . TEE WITHOUT CARDIOVERSION N/A 01/28/2018  Procedure: TRANSESOPHAGEAL ECHOCARDIOGRAM (TEE);  Surgeon: Acie Fredrickson Wonda Cheng, MD;  Location: [facility] ENDOSCOPY;  Service: Cardiovascular;  Laterality: N/A;  . TOTAL KNEE ARTHROPLASTY Left 07/21/2014   Procedure: LEFT TOTAL KNEE ARTHROPLASTY;  Surgeon: Johnn Hai, MD;  Location: [facility] ORS;  Service: Orthopedics;  Laterality: Left;  . TOTAL KNEE ARTHROPLASTY Right 05/30/2016   Procedure: RIGHT TOTAL KNEE ARTHROPLASTY REPLACEMENT;  Surgeon: Susa Day, MD;  Location: [facility] OR[facility];  Service: Orthopedics;   Laterality: Right;       Family History  Problem Relation Age of Onset  . Cancer Father        laryngeal   . Colon cancer Neg Hx   . Esophageal cancer Neg Hx   . Rectal cancer Neg Hx   . Stomach cancer Neg Hx     Social History   Tobacco Use  . Smoking status: Former Smoker    Types: Cigarettes    Quit date: 10/07/2001    Years since quitting: 18.4  . Smokeless tobacco: Never Used  Vaping Use  . Vaping Use: Never used  Substance Use Topics  . Alcohol use: Not Currently    Alcohol/week: 6.0 standard drinks    Types: 6 Cans of beer per week    Comment: beer daily, none since 02/2020   . Drug use: No    Home Medications Prior to Admission medications   Medication Sig Start Date End Date Taking? Authorizing Provider  albuterol (VENTOLIN HFA) 108 (90 Base) MCG/ACT inhaler Inhale 1 puff into the lungs every 4 (four) hours as needed for wheezing or shortness of breath. Use as directed 05/11/19  Yes [provider]  amLODipine (NORVASC) 10 MG tablet TAKE 1 TABLET BY MOUTH EVERY DAY IN THE MORNING Patient taking differently: Take 10 mg by mouth daily.  12/01/19  Yes [name], [name], MD  aspirin 81 MG EC tablet Take 1 tablet (81 mg total) by mouth daily. 05/17/19  Yes Daune Perch, NP  atorvastatin (LIPITOR) 40 MG tablet Take 1 tablet (40 mg total) by mouth daily. 02/07/20  Yes [name], MD  azelastine (ASTELIN) 0.1 % nasal spray Place 1 spray into both nostrils as needed for allergies.  01/13/18  Yes [provider]  azithromycin (ZITHROMAX Z-PAK) 250 MG tablet Take as prescribed for package instructions Patient taking differently: Take 250-500 mg by mouth as directed. Take as prescribed for package instructions. Day 1: Take 2 tablets (500 mg) Then Days 2-5: Take 1 tablet Daily 03/21/20  Yes [name], NP  chlorpheniramine-HYDROcodone (TUSSIONEX) 10-8 MG/5ML SUER Take 5 mLs by mouth every 12 (twelve) hours as needed for cough. 03/14/20  Yes  [name], NP  clopidogrel (PLAVIX) 75 MG tablet Take 1 tablet (75 mg total) by mouth daily. Patient taking differently: Take 75 mg by mouth every evening.  11/08/19  Yes [name], MD  fexofenadine (ALLEGRA) 60 MG tablet Take 60 mg by mouth daily as needed for allergies.    Yes [provider]  Fluticasone-Salmeterol 232-14 MCG/ACT AEPB Inhale 1 puff into the lungs 2 (two) times daily.   Yes [provider]  montelukast (SINGULAIR) 10 MG tablet Take 10 mg by mouth every morning. 05/12/19  Yes [provider]  nitroGLYCERIN (NITROSTAT) 0.4 MG SL tablet Place 1 tablet (0.4 mg total) under the tongue every 5 (five) minutes as needed for chest pain. 05/05/19  Yes Furth, Cadence H, PA-C  oxyCODONE (OXY IR/ROXICODONE) 5 MG immediate release tablet Take 1 tablet (  5 mg total) by mouth every 4 (four) hours as needed for severe pain. 03/03/20  Yes [name], [name], MD  pantoprazole (PROTONIX) 40 MG tablet TAKE 1 TABLET BY MOUTH EVERY DAY Patient taking differently: Take 40 mg by mouth daily.  11/09/19  Yes [name], [name], MD  predniSONE (DELTASONE) 10 MG tablet Take 4 tablets once daily for 1 day, then take 3 tablets for 1 day, then take 2 tablets for 1 day, then take 1 tablet 03/21/20  Yes [name], NP  spironolactone (ALDACTONE) 25 MG tablet TAKE 1 TABLET BY MOUTH EVERY DAY Patient taking differently: Take 25 mg by mouth daily.  12/01/19  Yes [name], [name], MD  atorvastatin (LIPITOR) 80 MG tablet SMARTSIG:1 Tablet(s) By Mouth Every Evening Patient not taking: Reported on 03/23/2020 02/26/20   [provider]    Allergies    Erythromycin base  Review of Systems   Review of Systems  Gastrointestinal: Positive for abdominal pain.  All other systems reviewed and are negative.   Physical Exam Updated Vital Signs BP 119/79 (BP Location: Left Arm)   Pulse 79   Temp 97.7 F (36.5 C) (Oral)   Resp 20   Ht 6' 3.5"  (1.918 m)   Wt 107.5 kg   SpO2 97%   BMI 29.23 kg/m   Physical Exam Vitals and nursing note reviewed.  HENT:     Head: Normocephalic.     Nose: Nose normal.     Mouth/Throat:     Mouth: Mucous membranes are moist.  Eyes:     Extraocular Movements: Extraocular movements intact.     Pupils: Pupils are equal, round, and reactive to light.     Comments: + mild scleral icterus   Cardiovascular:     Rate and Rhythm: Normal rate and regular rhythm.     Pulses: Normal pulses.     Heart sounds: Normal heart sounds.  Pulmonary:     Effort: Pulmonary effort is normal.     Breath sounds: Normal breath sounds.  Abdominal:     Comments: + RUQ tenderness, liver is enlarged on exam   Musculoskeletal:        General: Normal range of motion.     Cervical back: Normal range of motion.  Skin:    General: Skin is warm.     Capillary Refill: Capillary refill takes less than 2 seconds.  Neurological:     General: No focal deficit present.     Mental Status: He is alert.  Psychiatric:        Mood and Affect: Mood normal.        Behavior: Behavior normal.     ED Results / Procedures / Treatments   Labs (all labs ordered are listed, but only abnormal results are displayed) Labs Reviewed  CBC WITH DIFFERENTIAL/PLATELET - Abnormal; Notable for the following components:      Result Value   WBC 16.9 (*)    Hemoglobin 12.7 (*)    HCT 37.8 (*)    MCV 77.9 (*)    Neutro Abs 14.9 (*)    Lymphs Abs 0.6 (*)    Monocytes Absolute 1.3 (*)    Abs Immature Granulocytes 0.11 (*)    All other components within normal limits  BASIC METABOLIC PANEL - Abnormal; Notable for the following components:   Sodium 122 (*)    Chloride 87 (*)    CO2 21 (*)    Glucose, Bld 105 (*)  BUN 45 (*)    Creatinine, Ser 1.66 (*)    Calcium 7.6 (*)    GFR calc non Af Amer 42 (*)    GFR calc Af Amer 49 (*)    All other components within normal limits  HEPATIC FUNCTION PANEL - Abnormal; Notable for the following  components:   Total Protein 6.2 (*)    Albumin 2.3 (*)    AST 209 (*)    ALT 221 (*)    Alkaline Phosphatase 614 (*)    Total Bilirubin 5.4 (*)    Bilirubin, Direct 3.4 (*)    Indirect Bilirubin 2.0 (*)    All other components within normal limits  LACTATE DEHYDROGENASE - Abnormal; Notable for the following components:   LDH 647 (*)    All other components within normal limits  LACTIC ACID, PLASMA - Abnormal; Notable for the following components:   Lactic Acid, Venous 3.6 (*)    All other components within normal limits  LACTIC ACID, PLASMA - Abnormal; Notable for the following components:   Lactic Acid, Venous 3.6 (*)    All other components within normal limits  URINALYSIS, ROUTINE W REFLEX MICROSCOPIC - Abnormal; Notable for the following components:   Color, Urine AMBER (*)    All other components within normal limits  BASIC METABOLIC PANEL - Abnormal; Notable for the following components:   Sodium 125 (*)    Potassium 5.4 (*)    Chloride 90 (*)    CO2 20 (*)    BUN 42 (*)    Creatinine, Ser 1.50 (*)    Calcium 7.5 (*)    GFR calc non Af Amer 47 (*)    GFR calc Af Amer 55 (*)    All other components within normal limits  CULTURE, BLOOD (ROUTINE X 2)  CULTURE, BLOOD (ROUTINE X 2)  LIPASE, BLOOD  AMMONIA  CBC  SODIUM, URINE, RANDOM  CREATININE, URINE, 24 HOUR  HEPATITIS PANEL, ACUTE  COMPREHENSIVE METABOLIC PANEL  OSMOLALITY, URINE  TROPONIN I (HIGH SENSITIVITY)  TROPONIN I (HIGH SENSITIVITY)    EKG EKG Interpretation  Date/Time:  Thursday March 23 2020 18:39:14 EDT Ventricular Rate:  76 PR Interval:    QRS Duration: 108 QT Interval:  389 QTC Calculation: 438 R Axis:   0 Text Interpretation: Sinus rhythm Low voltage, precordial leads Probable anteroseptal infarct, old No significant change since last tracing Confirmed by Wandra Arthurs [phone]) on 03/23/2020 6:46:12 PM   Radiology CT ABDOMEN PELVIS W CONTRAST  Result Date: 03/23/2020 CLINICAL DATA:   Abdominal distension, abdominal pain with elevated LFTs with known metastatic disease to the liver. EXAM: CT ABDOMEN AND PELVIS WITH CONTRAST TECHNIQUE: Multidetector CT imaging of the abdomen and pelvis was performed using the standard protocol following bolus administration of intravenous contrast. CONTRAST:  153mL OMNIPAQUE IOHEXOL 300 MG/ML  SOLN COMPARISON:  03/01/2020 FINDINGS: Lower chest: Interval development of RIGHT-sided effusion and basilar airspace disease since the prior study. Calcified coronary artery disease. No pericardial effusion. Heart is incompletely imaged. Minimal atelectasis at the LEFT lung base. Hepatobiliary: Multifocal hepatic metastatic lesions now more confluent and enlarging when compared to the prior study. (Image 26, series 2) 3.5 cm lesion previously 2.9 cm. Near confluent disease in the LEFT hepatic lobe on today's exam. Area of confluent disease in the RIGHT hepatic lobe (image 39, series 2) 5.0 cm at most 3.7 cm on the prior study and without discrete intervening normal hepatic parenchyma between lesions in this area. Portal vein is patent.  Gallbladder is collapsed. Question of lack of enhancement of the medial wall of the gallbladder. In the current context the significance is uncertain the gallbladder could be collapse upon itself. Signs of ascites and small pericholecystic fluid. Portal vein is patent. Pancreas: Pancreas is normal without ductal dilation or inflammation. Spleen: Spleen normal size and contour. Adrenals/Urinary Tract: Adrenal glands are normal. Symmetric renal enhancement. No hydronephrosis. LEFT renal cyst arises from lateral LEFT kidney as before. Urinary bladder is normal to the extent evaluated aside from LEFT posterolateral bladder diverticulum. Stomach/Bowel: Stomach is under distended. Small bowel without overt signs of obstruction. Mildly dilated distal small bowel loops filled with liquid stool like material. Stool and gas filling the proximal colon.  Colonic diverticulosis. No diverticulitis. The appendix is normal. Vascular/Lymphatic: Calcific and noncalcific atheromatous plaque of the abdominal aorta. Mild dilation without aneurysm. 2.9 cm greatest axial dimension. Retroperitoneal adenopathy. (Image 46, series 2) 1.3 cm previously 1 cm short axis. Periportal nodal enlargement is similar. Juxta crural lymph node and hepatic gastric lymph nodes are similar. Reproductive: Prostate is unremarkable by CT. Other: Post LEFT inguinal herniorrhaphy. Ascites which is new from the prior study. No signs of free air. Musculoskeletal: Spinal degenerative changes. No acute or destructive bone process. IMPRESSION: 1. Given new ascites and lack of gallbladder wall enhancement with collapse of the gallbladder since previous imaging studies would suggest focused assessment with gallbladder ultrasound to ensure gallbladder wall integrity. HIDA scan is likely to be un informative at this time due to hepatic dysfunction. 2. Multifocal hepatic metastatic lesions now more confluent and enlarging when compared to the prior study. 3. New small volume ascites is more likely related to worsening hepatic dysfunction in the setting of extensive hepatic metastatic disease. 4. Interval development of RIGHT-sided effusion and basilar airspace disease since the prior study. 5. Colonic diverticulosis without diverticulitis. 6. Aortic atherosclerosis. Aortic Atherosclerosis (ICD10-I70.0). Electronically Signed   By: Zetta Bills M.D.   On: 03/23/2020 20:17   US Abdomen Limited RUQ  Result Date: 03/23/2020 CLINICAL DATA:  Hepatic metastatic disease, abnormal gallbladder on CT EXAM: ULTRASOUND ABDOMEN LIMITED RIGHT UPPER QUADRANT COMPARISON:  03/23/2020 FINDINGS: Gallbladder: Not well visualized. The gallbladder was completely decompressed on preceding CT. Common bile duct: Diameter: 5 mm Liver: Numerous hypoechoic masses are seen throughout the liver parenchyma consistent with known  metastatic disease. No intrahepatic duct dilation. Portal vein is patent on color Doppler imaging with normal direction of blood flow towards the liver. Other: None. IMPRESSION: 1. Diffuse liver metastases. 2. Nonvisualization of the gallbladder due to decompressed state. Electronically Signed   By: Randa Ngo M.D.   On: 03/23/2020 22:19    Procedures Procedures (including critical care time)  CRITICAL CARE Performed by: Wandra Arthurs   Total critical care time: 30 minute Critical care time was exclusive of separately billable procedures and treating other patients.  Critical care was necessary to treat or prevent imminent or life-threatening deterioration.  Critical care was time spent personally by me on the following activities: development of treatment plan with patient and/or surrogate as well as nursing, discussions with consultants, evaluation of patient's response to treatment, examination of patient, obtaining history from patient or surrogate, ordering and performing treatments and interventions, ordering and review of laboratory studies, ordering and review of radiographic studies, pulse oximetry and re-evaluation of patient's condition.  Medications Ordered in ED Medications  sodium chloride (PF) 0.9 % injection (  Not Given 03/23/20 1944)  furosemide (LASIX) injection 40 mg (has no administration in  time range)  aspirin EC tablet 81 mg (has no administration in time range)  azithromycin (ZITHROMAX) tablet 250 mg (has no administration in time range)  oxyCODONE (Oxy IR/ROXICODONE) immediate release tablet 5 mg (has no administration in time range)  amLODipine (NORVASC) tablet 10 mg (has no administration in time range)  spironolactone (ALDACTONE) tablet 25 mg (has no administration in time range)  pantoprazole (PROTONIX) EC tablet 40 mg (has no administration in time range)  clopidogrel (PLAVIX) tablet 75 mg (has no administration in time range)  montelukast (SINGULAIR)  tablet 10 mg (has no administration in time range)  predniSONE (DELTASONE) tablet 20 mg (has no administration in time range)  albuterol (PROVENTIL) (2.5 MG/3ML) 0.083% nebulizer solution 2.5 mg (has no administration in time range)  fluticasone furoate-vilanterol (BREO ELLIPTA) 200-25 MCG/INH 1 puff (has no administration in time range)  sodium chloride 0.9 % bolus 1,000 mL (0 mLs Intravenous Stopped 03/23/20 2039)  iohexol (OMNIPAQUE) 300 MG/ML solution 100 mL (100 mLs Intravenous Contrast Given 03/23/20 1920)    ED Course  I have reviewed the triage vital signs and the nursing notes.  Pertinent labs & imaging results that were available during my care of the patient were reviewed by me and considered in my medical decision making (see chart for details).    MDM Rules/Calculators/A&P                          Charles Mccoy is a 69 y.o. male here presenting with elevated bilirubin as well as elevated liver enzymes and mild AKI.  Patient has liver mets of unknown clear origin.  Will repeat CBC, CMP, CT abdomen pelvis to further assess.  8 pm Patient's labs are more abnormal than yesterday.  His sodium is down to 122 and his creatinine is up to 1.66.  His LFTs are also elevated his LDH is elevated as well.  His CT showed increasing metastasis and now he has ascites.  His lactate is 3.6 is likely from his liver dysfunction.  His urinalysis is normal and no fever so we will hold off on antibiotics and he will take his septic right now.  Patient was given IV fluids.  Patient will be admitted and likely will need paracentesis and diagnosis of the cancer in order to treat it.  Hospitalist to admit.  Final Clinical Impression(s) / ED Diagnoses Final diagnoses:  Liver lesion    Rx / DC Orders ED Discharge Orders    None       Drenda Freeze, MD 03/24/20 [phone]

## 2020-03-23 NOTE — ED Triage Notes (Signed)
Patient states he went to the [facility] today for lab work and was called and told to come to the ED. Patient has multiple abnormal labs.

## 2020-03-24 ENCOUNTER — Other Ambulatory Visit: Payer: Self-pay

## 2020-03-24 ENCOUNTER — Inpatient Hospital Stay (HOSPITAL_COMMUNITY): Payer: Medicare Other

## 2020-03-24 DIAGNOSIS — R188 Other ascites: Secondary | ICD-10-CM

## 2020-03-24 DIAGNOSIS — K7689 Other specified diseases of liver: Secondary | ICD-10-CM

## 2020-03-24 DIAGNOSIS — R7989 Other specified abnormal findings of blood chemistry: Secondary | ICD-10-CM

## 2020-03-24 DIAGNOSIS — C787 Secondary malignant neoplasm of liver and intrahepatic bile duct: Principal | ICD-10-CM

## 2020-03-24 DIAGNOSIS — R932 Abnormal findings on diagnostic imaging of liver and biliary tract: Secondary | ICD-10-CM

## 2020-03-24 LAB — CBC
HCT: 36.6 % — ABNORMAL LOW (ref 39.0–52.0)
Hemoglobin: 12.3 g/dL — ABNORMAL LOW (ref 13.0–17.0)
MCH: 26 pg (ref 26.0–34.0)
MCHC: 33.6 g/dL (ref 30.0–36.0)
MCV: 77.4 fL — ABNORMAL LOW (ref 80.0–100.0)
Platelets: 171 10*3/uL (ref 150–400)
RBC: 4.73 MIL/uL (ref 4.22–5.81)
RDW: 15.5 % (ref 11.5–15.5)
WBC: 16.9 10*3/uL — ABNORMAL HIGH (ref 4.0–10.5)
nRBC: 0 % (ref 0.0–0.2)

## 2020-03-24 LAB — COMPREHENSIVE METABOLIC PANEL
ALT: 209 U/L — ABNORMAL HIGH (ref 0–44)
AST: 196 U/L — ABNORMAL HIGH (ref 15–41)
Albumin: 2.2 g/dL — ABNORMAL LOW (ref 3.5–5.0)
Alkaline Phosphatase: 560 U/L — ABNORMAL HIGH (ref 38–126)
Anion gap: 12 (ref 5–15)
BUN: 41 mg/dL — ABNORMAL HIGH (ref 8–23)
CO2: 20 mmol/L — ABNORMAL LOW (ref 22–32)
Calcium: 7.3 mg/dL — ABNORMAL LOW (ref 8.9–10.3)
Chloride: 91 mmol/L — ABNORMAL LOW (ref 98–111)
Creatinine, Ser: 1.43 mg/dL — ABNORMAL HIGH (ref 0.61–1.24)
GFR calc Af Amer: 58 mL/min — ABNORMAL LOW (ref >60)
GFR calc non Af Amer: 50 mL/min — ABNORMAL LOW (ref >60)
Glucose, Bld: 128 mg/dL — ABNORMAL HIGH (ref 70–99)
Potassium: 4.6 mmol/L (ref 3.5–5.1)
Sodium: 123 mmol/L — ABNORMAL LOW (ref 135–145)
Total Bilirubin: 5.5 mg/dL — ABNORMAL HIGH (ref 0.3–1.2)
Total Protein: 5.8 g/dL — ABNORMAL LOW (ref 6.5–8.1)

## 2020-03-24 LAB — HEPATITIS A ANTIBODY, IGM: Hep A IgM: NONREACTIVE

## 2020-03-24 LAB — HEPATITIS B CORE ANTIBODY, IGM: Hep B C IgM: NONREACTIVE

## 2020-03-24 LAB — HEPATITIS A ANTIBODY, TOTAL: hep A Total Ab: NONREACTIVE

## 2020-03-24 LAB — CREATININE, URINE, 24 HOUR
Collection Interval-UCRE24: 24 hours
Creatinine, 24H Ur: 1407 mg/d (ref 800–2000)
Creatinine, Urine: 63.94 mg/dL
Urine Total Volume-UCRE24: 2200 mL

## 2020-03-24 LAB — HEPATITIS B SURFACE ANTIGEN: Hepatitis B Surface Ag: NONREACTIVE

## 2020-03-24 LAB — PROTIME-INR
INR: 1.6 — ABNORMAL HIGH (ref 0.8–1.2)
Prothrombin Time: 18.9 seconds — ABNORMAL HIGH (ref 11.4–15.2)

## 2020-03-24 LAB — TSH: TSH: 2.324 u[IU]/mL (ref 0.350–4.500)

## 2020-03-24 LAB — HEPATITIS B CORE ANTIBODY, TOTAL: Hep B Core Total Ab: NONREACTIVE

## 2020-03-24 LAB — SARS CORONAVIRUS 2 BY RT PCR (HOSPITAL ORDER, PERFORMED IN ~~LOC~~ HOSPITAL LAB): SARS Coronavirus 2: NEGATIVE

## 2020-03-24 LAB — BILIRUBIN, DIRECT: Bilirubin, Direct: 4 mg/dL — ABNORMAL HIGH (ref 0.0–0.2)

## 2020-03-24 LAB — NA AND K (SODIUM & POTASSIUM), RAND UR
Potassium Urine: 31 mmol/L
Sodium, Ur: 31 mmol/L

## 2020-03-24 MED ORDER — SODIUM CHLORIDE 0.9 % IV SOLN
INTRAVENOUS | Status: DC | PRN
  Administered 2020-03-24: 500 mL via INTRAVENOUS

## 2020-03-24 MED ORDER — POLYETHYLENE GLYCOL 3350 17 G PO PACK: 17.0000 g | PACK | Freq: Every day | ORAL | Status: DC | PRN

## 2020-03-24 MED ORDER — FENTANYL CITRATE (PF) 100 MCG/2ML IJ SOLN
INTRAMUSCULAR | Status: AC | PRN
  Administered 2020-03-24: 50 ug via INTRAVENOUS

## 2020-03-24 MED ORDER — VITAMIN K1 10 MG/ML IJ SOLN
10.0000 mg | Freq: Every day | INTRAVENOUS | Status: AC
  Administered 2020-03-24 – 2020-03-26 (×3): 10 mg via INTRAVENOUS
  Filled 2020-03-24 (×3): qty 1

## 2020-03-24 MED ORDER — IPRATROPIUM-ALBUTEROL 0.5-2.5 (3) MG/3ML IN SOLN: 3.0000 mL | RESPIRATORY_TRACT | Status: DC | PRN

## 2020-03-24 MED ORDER — SENNOSIDES-DOCUSATE SODIUM 8.6-50 MG PO TABS
2.0000 | ORAL_TABLET | Freq: Every evening | ORAL | Status: DC | PRN
  Administered 2020-03-26: 2 via ORAL
  Filled 2020-03-24: qty 2

## 2020-03-24 MED ORDER — GELATIN ABSORBABLE 12-7 MM EX MISC
CUTANEOUS | Status: AC
  Filled 2020-03-24: qty 1

## 2020-03-24 MED ORDER — FENTANYL CITRATE (PF) 100 MCG/2ML IJ SOLN
INTRAMUSCULAR | Status: AC
  Filled 2020-03-24: qty 2

## 2020-03-24 MED ORDER — CLOPIDOGREL BISULFATE 75 MG PO TABS: 75.0000 mg | ORAL_TABLET | Freq: Every evening | ORAL | Status: DC

## 2020-03-24 MED ORDER — MIDAZOLAM HCL 2 MG/2ML IJ SOLN
INTRAMUSCULAR | Status: AC | PRN
  Administered 2020-03-24: 1 mg via INTRAVENOUS

## 2020-03-24 MED ORDER — MIDAZOLAM HCL 2 MG/2ML IJ SOLN
INTRAMUSCULAR | Status: AC
  Filled 2020-03-24: qty 4

## 2020-03-24 MED ORDER — LIDOCAINE HCL 1 % IJ SOLN
INTRAMUSCULAR | Status: AC
  Filled 2020-03-24: qty 20

## 2020-03-24 NOTE — H&P (Signed)
Chief Complaint: Patient was seen in consultation today for liver lesion biopsy   Referring Physician(s): Burton,Lacie K Dr Ky Barban Dr. Reesa Chew  Supervising Physician: Dr. Pascal Lux  Patient Status: [place]  History of Present Illness: Charles Mccoy is a 69 y.o. male being worked up for hepatic masses. He was scheduled earlier this week but INR was elevated and tachycardic. Procedure was postponed. Has now been admitted to [facility] with AKI and hyponatremia. Plavix was originally held but restarted a few days ago, pt has received 3 doses. Since more stable from HR standpoint and INR level remains 1.6, IR is asked to proceed with bx while inpt. Chart, imaging reviewed. CT shows small amount ascites and small right pleural effusion. Too small for para/thora and not likely to be diagnostic anyway. Will proceed with liver bx. Wife at bedside.      Past Medical History:  Diagnosis Date  . ALLERGIC RHINITIS 06/19/2007  . ASTHMA 08/17/2008  . Back pain 05/2016  . CAD (coronary artery disease)    05/03/19- NSTEMI, 05/04/19 DES to LAD  . COPD (chronic obstructive pulmonary disease) ([place])   . GERD (gastroesophageal reflux disease)   . Hyperlipidemia   . HYPERTENSION 06/19/2007  . OSTEOARTHRITIS 10/09/2009   wrists, knee.  . Pneumonia    hx of   . SINUSITIS 09/15/2007   recent issuses 2 weeks ago-clearing.  . Stroke [facility])     Past Surgical History:  Procedure Laterality Date  . CARDIAC CATHETERIZATION    . CORONARY STENT INTERVENTION N/A 05/04/2019   Procedure: CORONARY STENT INTERVENTION;  Surgeon: Leonie Man, MD;  Location: [place] [facility];  Service: Cardiovascular;  Laterality: N/A;  . cyst removed from neck      age 17   . KNEE ARTHROSCOPY     2 on left knee , 1 on right  . LEFT HEART CATH AND CORONARY ANGIOGRAPHY N/A 05/04/2019   Procedure: LEFT HEART CATH AND CORONARY ANGIOGRAPHY;  Surgeon: Leonie Man, MD;  Location: [place] [facility];  Service: Cardiovascular;   Laterality: N/A;  . LOOP RECORDER INSERTION N/A 01/28/2018   Procedure: LOOP RECORDER INSERTION;  Surgeon: Evans Lance, MD;  Location: [place] [facility];  Service: Cardiovascular;  Laterality: N/A;  . TEE WITHOUT CARDIOVERSION N/A 01/28/2018   Procedure: TRANSESOPHAGEAL ECHOCARDIOGRAM (TEE);  Surgeon: Acie Fredrickson Wonda Cheng, MD;  Location: [facility] ENDOSCOPY;  Service: Cardiovascular;  Laterality: N/A;  . TOTAL KNEE ARTHROPLASTY Left 07/21/2014   Procedure: LEFT TOTAL KNEE ARTHROPLASTY;  Surgeon: Johnn Hai, MD;  Location: [facility] ORS;  Service: Orthopedics;  Laterality: Left;  . TOTAL KNEE ARTHROPLASTY Right 05/30/2016   Procedure: RIGHT TOTAL KNEE ARTHROPLASTY REPLACEMENT;  Surgeon: Susa Day, MD;  Location: [facility] ORS;  Service: Orthopedics;  Laterality: Right;    Allergies: Erythromycin base  Medications:  Current Facility-Administered Medications:  .  amLODipine (NORVASC) tablet 10 mg, 10 mg, Oral, Daily, Tu, Ching T, DO, 10 mg at 03/24/20 1034 .  aspirin EC tablet 81 mg, 81 mg, Oral, Daily, Tu, Ching T, DO, 81 mg at 03/24/20 1035 .  azithromycin (ZITHROMAX) tablet 250 mg, 250 mg, Oral, Daily, Tu, Ching T, DO, 250 mg at 03/24/20 1035 .  fluticasone furoate-vilanterol (BREO ELLIPTA) 200-25 MCG/INH 1 puff, 1 puff, Inhalation, Daily, Tu, Ching T, DO, 1 puff at 03/24/20 0902 .  ipratropium-albuterol (DUONEB) 0.5-2.5 (3) MG/3ML nebulizer solution 3 mL, 3 mL, Nebulization, Q4H PRN, Amin, Ankit Chirag, MD .  montelukast (SINGULAIR) tablet 10 mg,  10 mg, Oral, Daily, Tu, Ching T, DO, 10 mg at 03/24/20 1035 .  oxyCODONE (Oxy IR/ROXICODONE) immediate release tablet 5 mg, 5 mg, Oral, Q4H PRN, Tu, Ching T, DO .  pantoprazole (PROTONIX) EC tablet 40 mg, 40 mg, Oral, Daily, Tu, Ching T, DO, 40 mg at 03/24/20 1035 .  polyethylene glycol (MIRALAX / GLYCOLAX) packet 17 g, 17 g, Oral, Daily PRN, Amin, Ankit Chirag, MD .  predniSONE (DELTASONE) tablet 20 mg, 20 mg, Oral, Q breakfast, Tu, Ching T, DO, 20 mg at  03/24/20 0805 .  senna-docusate (Senokot-S) tablet 2 tablet, 2 tablet, Oral, QHS PRN, Amin, Ankit Chirag, MD .  spironolactone (ALDACTONE) tablet 25 mg, 25 mg, Oral, Daily, Tu, Ching T, DO, 25 mg at 03/24/20 1036    Family History  Problem Relation Age of Onset  . Cancer Father        laryngeal   . Colon cancer Neg Hx   . Esophageal cancer Neg Hx   . Rectal cancer Neg Hx   . Stomach cancer Neg Hx     Social History   Socioeconomic History  . Marital status: Married    Spouse name: Not on file  . Number of children: 2  . Years of education: Not on file  . Highest education level: High school graduate  Occupational History  . Not on file  Tobacco Use  . Smoking status: Former Smoker    Types: Cigarettes    Quit date: 10/07/2001    Years since quitting: 18.4  . Smokeless tobacco: Never Used  Vaping Use  . Vaping Use: Never used  Substance and Sexual Activity  . Alcohol use: Not Currently    Alcohol/week: 6.0 standard drinks    Types: 6 Cans of beer per week    Comment: beer daily, none since 02/2020   . Drug use: No  . Sexual activity: Yes  Other Topics Concern  . Not on file  Social History Narrative  . Not on file   Social Determinants of Health   Financial Resource Strain: Low Risk   . Difficulty of Paying Living Expenses: Not hard at all  Food Insecurity: No Food Insecurity  . Worried About Charity fundraiser in the Last Year: Never true  . Ran Out of Food in the Last Year: Never true  Transportation Needs: No Transportation Needs  . Lack of Transportation (Medical): No  . Lack of Transportation (Non-Medical): No  Physical Activity: Sufficiently Active  . Days of Exercise per Week: 5 days  . Minutes of Exercise per Session: 30 min  Stress: No Stress Concern Present  . Feeling of Stress : Only a little  Social Connections:   . Frequency of Communication with Friends and Family:   . Frequency of Social Gatherings with Friends and Family:   . Attends  Religious Services:   . Active Member of Clubs or Organizations:   . Attends Archivist Meetings:   Marland Kitchen Marital Status:      Review of Systems: A 12 point ROS discussed and pertinent positives are indicated in the HPI above.  All other systems are negative.  Review of Systems  Constitutional: Positive for activity change, appetite change, fatigue and unexpected weight change.  Respiratory: Positive for shortness of breath. Negative for cough.   Cardiovascular: Negative for chest pain.  Neurological: Positive for weakness.  Psychiatric/Behavioral: Negative for behavioral problems and confusion.    Vital Signs: BP 124/77 (BP Location: Right Arm)   Pulse 80  Temp 98 F (36.7 C) (Oral)   Resp 20   Ht 6' 3.5" (1.918 m)   Wt 107.5 kg   SpO2 94%   BMI 29.23 kg/m   Physical Exam Vitals reviewed.  Constitutional:      Appearance: He is ill-appearing. He is not diaphoretic.  Cardiovascular:     Rate and Rhythm: Normal rate and regular rhythm.     Heart sounds: Normal heart sounds.  Pulmonary:     Effort: Pulmonary effort is normal.     Breath sounds: Normal breath sounds.  Abdominal:     General: Abdomen is flat. There is no distension.     Palpations: Abdomen is soft. There is no mass.  Musculoskeletal:        General: Normal range of motion.  Skin:    General: Skin is warm and dry.  Neurological:     General: No focal deficit present.     Mental Status: He is alert and oriented to person, place, and time.  Psychiatric:        Mood and Affect: Mood normal.        Behavior: Behavior normal.        Thought Content: Thought content normal.     Imaging: DG Chest 2 View  Result Date: 03/20/2020 CLINICAL DATA:  Chest pain and shortness of breath EXAM: CHEST - 2 VIEW COMPARISON:  Mar 01, 2016 FINDINGS: There is bibasilar atelectatic change. The lungs elsewhere are clear. The heart size and pulmonary vascularity are normal. No adenopathy. There is aortic  atherosclerosis. Loop recorder present on the left anteriorly. There is degenerative change in the thoracic spine. IMPRESSION: Bibasilar atelectasis. No edema or airspace opacity. Stable cardiac silhouette. Aortic Atherosclerosis (ICD10-I70.0). Electronically Signed   By: Lowella Grip III M.D.   On: 03/20/2020 13:11   DG Chest 2 View  Result Date: 03/01/2020 CLINICAL DATA:  Chest pain EXAM: CHEST - 2 VIEW COMPARISON:  May 03, 2019 FINDINGS: There is mild bibasilar atelectasis. Lungs otherwise are clear. The heart size and pulmonary vascularity are normal. No adenopathy. There are foci left anterior descending coronary artery calcification. There is aortic atherosclerosis. There is degenerative change in the thoracic spine. There is a loop recorder on the left anteriorly. IMPRESSION: Bibasilar atelectasis.  Lungs otherwise clear.  Heart size normal. Coronary artery calcification noted. Aortic Atherosclerosis (ICD10-I70.0). Loop recorder on left anteriorly. Electronically Signed   By: Lowella Grip III M.D.   On: 03/01/2020 10:23   CT Angio Chest PE W/Cm &/Or Wo Cm  Result Date: 03/01/2020 CLINICAL DATA:  Shortness of breath. Concern for pulmonary embolism. Positive D-dimer. EXAM: CT ANGIOGRAPHY CHEST WITH CONTRAST TECHNIQUE: Multidetector CT imaging of the chest was performed using the standard protocol during bolus administration of intravenous contrast. Multiplanar CT image reconstructions and MIPs were obtained to evaluate the vascular anatomy. CONTRAST:  67mL OMNIPAQUE IOHEXOL 350 MG/ML SOLN COMPARISON:  None. FINDINGS: Cardiovascular: Contrast injection is sufficient to demonstrate satisfactory opacification of the pulmonary arteries to the segmental level. There is no pulmonary embolus. The main pulmonary artery is within normal limits for size. There is no CT evidence of acute right heart strain. There are atherosclerotic changes of the visualized thoracic aorta. The thoracic aorta is  borderline aneurysmal measuring approximately 4 cm in diameter. Heart size is coronary artery calcifications are noted. Mediastinum/Nodes: --there are mildly enlarged mediastinal lymph nodes. For example there is a right paratracheal lymph node measuring approximately 1.5 cm (axial series 5, image 52). --No axillary  lymphadenopathy. --No supraclavicular lymphadenopathy. --Normal thyroid gland. --The esophagus is unremarkable Lungs/Pleura: There is no pneumothorax. Atelectasis is noted at the lung bases. There is no significant pleural effusion. There is a nodule along the major fissure on the left favored to represent a small lymph node. Upper Abdomen: There are innumerable low-attenuation masses throughout the patient's liver. There are few mildly enlarged lymph nodes in the upper abdomen. Musculoskeletal: No chest wall abnormality. No acute or significant osseous findings. Review of the MIP images confirms the above findings. IMPRESSION: 1. No acute pulmonary embolism. 2. There are innumerable masses in the patient's liver concerning for metastatic disease, currently with an unknown primary. 3. There are mildly enlarged mediastinal lymph nodes which may be reactive or metastatic. Attention on follow-up examinations is recommended. 4. Bibasilar atelectasis is noted. Aortic Atherosclerosis (ICD10-I70.0). Electronically Signed   By: Constance Holster M.D.   On: 03/01/2020 19:03   CT ABDOMEN PELVIS W CONTRAST  Result Date: 03/23/2020 CLINICAL DATA:  Abdominal distension, abdominal pain with elevated LFTs with known metastatic disease to the liver. EXAM: CT ABDOMEN AND PELVIS WITH CONTRAST TECHNIQUE: Multidetector CT imaging of the abdomen and pelvis was performed using the standard protocol following bolus administration of intravenous contrast. CONTRAST:  143mL OMNIPAQUE IOHEXOL 300 MG/ML  SOLN COMPARISON:  03/01/2020 FINDINGS: Lower chest: Interval development of RIGHT-sided effusion and basilar airspace  disease since the prior study. Calcified coronary artery disease. No pericardial effusion. Heart is incompletely imaged. Minimal atelectasis at the LEFT lung base. Hepatobiliary: Multifocal hepatic metastatic lesions now more confluent and enlarging when compared to the prior study. (Image 26, series 2) 3.5 cm lesion previously 2.9 cm. Near confluent disease in the LEFT hepatic lobe on today's exam. Area of confluent disease in the RIGHT hepatic lobe (image 39, series 2) 5.0 cm at most 3.7 cm on the prior study and without discrete intervening normal hepatic parenchyma between lesions in this area. Portal vein is patent. Gallbladder is collapsed. Question of lack of enhancement of the medial wall of the gallbladder. In the current context the significance is uncertain the gallbladder could be collapse upon itself. Signs of ascites and small pericholecystic fluid. Portal vein is patent. Pancreas: Pancreas is normal without ductal dilation or inflammation. Spleen: Spleen normal size and contour. Adrenals/Urinary Tract: Adrenal glands are normal. Symmetric renal enhancement. No hydronephrosis. LEFT renal cyst arises from lateral LEFT kidney as before. Urinary bladder is normal to the extent evaluated aside from LEFT posterolateral bladder diverticulum. Stomach/Bowel: Stomach is under distended. Small bowel without overt signs of obstruction. Mildly dilated distal small bowel loops filled with liquid stool like material. Stool and gas filling the proximal colon. Colonic diverticulosis. No diverticulitis. The appendix is normal. Vascular/Lymphatic: Calcific and noncalcific atheromatous plaque of the abdominal aorta. Mild dilation without aneurysm. 2.9 cm greatest axial dimension. Retroperitoneal adenopathy. (Image 46, series 2) 1.3 cm previously 1 cm short axis. Periportal nodal enlargement is similar. Juxta crural lymph node and hepatic gastric lymph nodes are similar. Reproductive: Prostate is unremarkable by CT.  Other: Post LEFT inguinal herniorrhaphy. Ascites which is new from the prior study. No signs of free air. Musculoskeletal: Spinal degenerative changes. No acute or destructive bone process. IMPRESSION: 1. Given new ascites and lack of gallbladder wall enhancement with collapse of the gallbladder since previous imaging studies would suggest focused assessment with gallbladder ultrasound to ensure gallbladder wall integrity. HIDA scan is likely to be un informative at this time due to hepatic dysfunction. 2. Multifocal hepatic metastatic lesions now  more confluent and enlarging when compared to the prior study. 3. New small volume ascites is more likely related to worsening hepatic dysfunction in the setting of extensive hepatic metastatic disease. 4. Interval development of RIGHT-sided effusion and basilar airspace disease since the prior study. 5. Colonic diverticulosis without diverticulitis. 6. Aortic atherosclerosis. Aortic Atherosclerosis (ICD10-I70.0). Electronically Signed   By: Zetta Bills M.D.   On: 03/23/2020 20:17   CT ABDOMEN PELVIS W CONTRAST  Result Date: 03/01/2020 CLINICAL DATA:  Chest CTA with findings suspicious for hepatic metastatic disease. EXAM: CT ABDOMEN AND PELVIS WITH CONTRAST TECHNIQUE: Multidetector CT imaging of the abdomen and pelvis was performed using the standard protocol following bolus administration of intravenous contrast. CONTRAST:  22mL OMNIPAQUE IOHEXOL 300 MG/ML  SOLN COMPARISON:  Chest CTA earlier today. Abdominopelvic CT 08/17/2018 FINDINGS: Lower chest: Assessed on chest CT earlier today. Right greater than left basilar atelectasis. Pleural thickening without significant effusion. Hepatobiliary: Innumerable low-density lesions scattered throughout the liver parenchyma most consistent with diffuse hepatic metastatic disease. Majority of the lesions are 1-2 cm. Largest lesion in the right hepatic dome measures 2.4 cm. Gallbladder physiologically distended, no calcified  stone. No biliary dilatation. Pancreas: No ductal dilatation or inflammation. No evidence of pancreatic mass. Spleen: Normal in size without focal abnormality. Adrenals/Urinary Tract: Mild thickening of the lateral left adrenal gland is unchanged from prior exam. The right adrenal gland is normal. There is no suspicious adrenal nodule. No hydronephrosis. There is symmetric bilateral perinephric edema. There is a 3.3 cm exophytic low-density lesion from the posterior left kidney that likely represents a minimally complex cyst. This is not significantly changed in size from prior exam. Excreted IV contrast within both renal collecting systems which limits assessment for stone. Probable parapelvic cyst in the left kidney. There is excreted IV contrast within the urinary bladder without evidence of bladder wall thickening or focal lesion. Stomach/Bowel: Detailed bowel evaluation is limited in the absence of enteric contrast. Mild wall thickening of the distal esophagus. Unopacified stomach is unremarkable. Normal positioning of the ligament of Treitz. Small duodenal diverticulum. No small bowel inflammation or obstruction. No obvious small bowel mass. Normal appendix. Moderate volume of stool throughout the colon. Diverticulosis from the splenic flexure distally. No diverticulitis. Few areas of nondistention involving the sigmoid colon likely related to peristalsis, no obvious colonic mass. Vascular/Lymphatic: There is a 12 mm portal caval node, series 3, image 24. Suspected 14 mm retrocrural node adjacent to the distal esophagus, series 3, image 15. clustered prominent nodes adjacent to the celiac axis measuring up to 10 mm, series 3, image 22. 8 mm SMA node, series 3, image 28. There is left periaortic retroperitoneal adenopathy, with nodes measuring up to 13 mm, series 3, image 37. No definite enlarged pelvic lymph nodes. Ectatic infrarenal aorta measuring 2.7 cm. Aortic atherosclerosis. The portal vein is patent.  Reproductive: Prostate is unremarkable. Other: Trace fluid adjacent to the inferior liver tip and edema in the right pericolic gutter. No significant ascites. No omental thickening or nodularity. No free air. Prior left inguinal hernia repair. There is fat in the right inguinal canal. Musculoskeletal: No blastic osseous lesion. No evidence of destructive lytic lesion. Multilevel degenerative change in the spine. No suspicious subcutaneous or abdominal wall lesion. IMPRESSION: 1. Innumerable low-density lesions scattered throughout the liver parenchyma most consistent with diffuse hepatic metastatic disease. 2. Retroperitoneal and upper abdominal adenopathy, suspicious for metastatic disease. 3. Primary malignancy is not definitively characterized, however there is wall thickening of the distal esophagus  which raises concern for occult esophageal primary. Recommend further evaluation with endoscopy. 4. Colonic diverticulosis without diverticulitis. Areas of nondistention of the sigmoid colon without dominant colonic mass. 5. Ectatic abdominal aorta at risk for aneurysm development, maximal dimension 2.7 cm. Recommend followup by ultrasound in 5 years. This recommendation follows ACR consensus guidelines: White Paper of the ACR Incidental Findings Committee II on Vascular Findings. J Am Coll Radiol 2013; 10:789-794. Aortic aneurysm NOS (ICD10-I71.9) Aortic Atherosclerosis (ICD10-I70.0). Electronically Signed   By: Keith Rake M.D.   On: 03/01/2020 21:30   US Abdomen Limited RUQ  Result Date: 03/23/2020 CLINICAL DATA:  Hepatic metastatic disease, abnormal gallbladder on CT EXAM: ULTRASOUND ABDOMEN LIMITED RIGHT UPPER QUADRANT COMPARISON:  03/23/2020 FINDINGS: Gallbladder: Not well visualized. The gallbladder was completely decompressed on preceding CT. Common bile duct: Diameter: 5 mm Liver: Numerous hypoechoic masses are seen throughout the liver parenchyma consistent with known metastatic disease. No  intrahepatic duct dilation. Portal vein is patent on color Doppler imaging with normal direction of blood flow towards the liver. Other: None. IMPRESSION: 1. Diffuse liver metastases. 2. Nonvisualization of the gallbladder due to decompressed state. Electronically Signed   By: Randa Ngo M.D.   On: 03/23/2020 22:19    Labs:  CBC: Recent Labs    03/21/20 0852 03/23/20 1339 03/23/20 1852 03/24/20 0312  WBC 18.1* 20.7* 16.9* 16.9*  HGB 12.9* 13.2 12.7* 12.3*  HCT 38.1* 38.7* 37.8* 36.6*  PLT 200 226 199 171    COAGS: Recent Labs    03/20/20 1115 03/21/20 0750 03/23/20 1340 03/24/20 0823  INR 1.7* 1.4* 1.7* 1.6*    BMP: Recent Labs    03/23/20 1339 03/23/20 1852 03/23/20 2206 03/24/20 0312  NA 123* 122* 125* 123*  K 4.8 5.0 5.4* 4.6  CL 89* 87* 90* 91*  CO2 21* 21* 20* 20*  GLUCOSE 101* 105* 96 128*  BUN 39* 45* 42* 41*  CALCIUM 7.9* 7.6* 7.5* 7.3*  CREATININE 1.54* 1.66* 1.50* 1.43*  GFRNONAA 46* 42* 47* 50*  GFRAA 53* 49* 55* 58*    LIVER FUNCTION TESTS: Recent Labs    03/01/20 1411 03/23/20 1339 03/23/20 1852 03/24/20 0312  BILITOT 0.8 5.5* 5.4* 5.5*  AST 39 231* 209* 196*  ALT 48* 252* 221* 209*  ALKPHOS 134* 742* 614* 560*  PROT 6.9 6.3* 6.2* 5.8*  ALBUMIN 3.3* 2.2* 2.3* 2.2*    TUMOR MARKERS: No results for input(s): AFPTM, CEA, CA199, CHROMGRNA in the last 8760 hours.  Assessment and Plan: Hepatic masses, unknown etiology/primary After review of case and all imaging, feel risk of bleeding is acceptable with a few recent doses of Plavix. INR is 1.6 and not likely to improve as their is essentially intrinsic liver disease from tumor replacement. Risks and benefits of liver lesion biopsy was discussed with the patient and/or patient's family including, but not limited to bleeding, infection, damage to adjacent structures or low yield requiring additional tests.  All of the questions were answered and there is agreement to proceed. Consent  signed and in chart.   Thank you for this interesting consult.  I greatly enjoyed meeting Ahkeem Goede and look forward to participating in their care.  A copy of this report was sent to the requesting provider on this date.  Electronically Signed: Ascencion Dike, PA-C 03/24/2020, 11:51 AM   I spent a total of  30 Minutes   in face to face in clinical consultation, greater than 50% of which was counseling/coordinating care for liver lesion bx

## 2020-03-24 NOTE — Procedures (Signed)
Pre Procedure Dx: Liver lesion  Post Procedural Dx: Same  Technically successful US guided biopsy of indeterminate lesion within the right lobe of the liver.   EBL: None  No immediate complications.   Jay Bryli Mantey, MD Pager #: [phone]    

## 2020-03-24 NOTE — Plan of Care (Signed)
POC initiated 

## 2020-03-24 NOTE — Progress Notes (Signed)
Pt c/o abd. Pain, but refuses to take any PRN meds. Pt educated regarding pain management. Pt continued to refuses any medication for pain.

## 2020-03-24 NOTE — Progress Notes (Signed)
PROGRESS NOTE    Charles Mccoy  DXA:[id number] DOB: 14-Jun-1951 DOA: 03/23/2020 PCP: [name], [name], MD   Brief Narrative:  69 year old with history of COPD, CVA, CAD status post stent, HLD, HTN, recent finding of liver lesions sent to the hospital by oncology for transaminitis and hyperbilirubinemia.  Upon admission had lactic acidosis, hyponatremia, 1.66, BUN 45, transaminitis, elevated total bilirubin.  CT abdomen pelvis showed ascites, multifocal liver lesions.   Assessment & Plan:   Principal Problem:   Hyperbilirubinemia Active Problems:   Essential hypertension   COPD (chronic obstructive pulmonary disease) (HCC)   CAD (coronary artery disease)   History of CVA (cerebrovascular accident)   Hyponatremia   Transaminitis   Liver lesion  Hyperbilirubinemia/transaminitis/ascites in the setting of worsening suspected metastatic liver mass Initially planned biopsy on 6/15 but held due to hemodynamic instability and elevated INR.  CT abdomen pelvis shows ascites. RUQ ultrasound-pending INR better, IR can plan for liver biopsy while patient is here Continue Aldactone Ammonia levels- 35 Patient of Dr. Burr Medico, oncology-notified  Hyponatremia with AKI Initial concerns for hepatorenal syndrome. Continue to monitor this. Urine electrolytes have been ordered. CT of the abdomen shows stable left renal cyst  Mild COPD exacerbation with Cough Recently outpatient prescribed prednisone taper and Z-Pak.  Will complete the course here.  Incentive spirometer flutter valve  Leukocytosis -No evidence of infection. Currently is on prednisone. Continue to monitor.  Hypertension Continue Norvasc  History of CAD s/p stent Continue aspirin Plavix statin Aldactone. Currently will hold Plavix in anticipation for liver biopsy Cardiac cath July 2020-severe LAD stenosis status post DES.  Initially on aspirin and Brilinta then changed to Plavix  History of CVA, 2008 Continue aspirin.  Hold Plavix until after biopsy   DVT prophylaxis: SCDs Code Status: Full  Family Communication: None  Status is: Inpatient  Remains inpatient appropriate because:Persistent severe electrolyte disturbances   Dispo: The patient is from: Home              Anticipated d/c is to: Home              Anticipated d/c date is: 2 days              Patient currently is not medically stable to d/c. Plans for liver biopsy in the meantime monitoring hyponatremia and getting work-up.    Subjective: Feels okay no complaints  Review of Systems Otherwise negative except as per HPI, including: General: Denies fever, chills, night sweats or unintended weight loss. Resp: Denies cough, wheezing, shortness of breath. Cardiac: Denies chest pain, palpitations, orthopnea, paroxysmal nocturnal dyspnea. GI: Denies abdominal pain, nausea, vomiting, diarrhea or constipation GU: Denies dysuria, frequency, hesitancy or incontinence MS: Denies muscle aches, joint pain or swelling Neuro: Denies headache, neurologic deficits (focal weakness, numbness, tingling), abnormal gait Psych: Denies anxiety, depression, SI/HI/AVH Skin: Denies new rashes or lesions ID: Denies sick contacts, exotic exposures, travel  Examination:  General exam: Appears calm and comfortable  Respiratory system: Clear to auscultation. Respiratory effort normal. Cardiovascular system: S1 & S2 heard, RRR. No JVD, murmurs, rubs, gallops or clicks. No pedal edema. Gastrointestinal system: Abdomen is nondistended, soft and nontender. No organomegaly or masses felt. Normal bowel sounds heard. Central nervous system: Alert and oriented. No focal neurological deficits. Extremities: Symmetric 5 x 5 power. Skin: No rashes, lesions or ulcers Psychiatry: Judgement and insight appear normal. Mood & affect appropriate.     Objective: Vitals:   03/23/20 2335 03/24/20 0217 03/24/20 0325 03/24/20 0450  BP:  119/79 119/76  123/80  Pulse: 79 72  73    Resp: 20 15  15   Temp: 97.7 F (36.5 C) 98.4 F (36.9 C)  98 F (36.7 C)  TempSrc: Oral   Oral  SpO2: 97% 94%  98%  Weight:   107.5 kg   Height:   6' 3.5" (1.918 m)     Intake/Output Summary (Last 24 hours) at 03/24/2020 0742 Last data filed at 03/24/2020 0450 Gross per 24 hour  Intake 1000 ml  Output 0 ml  Net 1000 ml   Filed Weights   03/23/20 1611 03/24/20 0325  Weight: 107.5 kg 107.5 kg     Data Reviewed:   CBC: Recent Labs  Lab 03/20/20 1322 03/21/20 0852 03/23/20 1339 03/23/20 1852 03/24/20 0312  WBC 16.9* 18.1* 20.7* 16.9* 16.9*  NEUTROABS  --  15.4* 17.8* 14.9*  --   HGB 12.7* 12.9* 13.2 12.7* 12.3*  HCT 38.4* 38.1* 38.7* 37.8* 36.6*  MCV 79.0* 75.7* 76.0* 77.9* 77.4*  PLT 201 200 226 199 270   Basic Metabolic Panel: Recent Labs  Lab 03/23/20 1339 03/23/20 1852 03/23/20 2206 03/24/20 0312  NA 123* 122* 125* 123*  K 4.8 5.0 5.4* 4.6  CL 89* 87* 90* 91*  CO2 21* 21* 20* 20*  GLUCOSE 101* 105* 96 128*  BUN 39* 45* 42* 41*  CREATININE 1.54* 1.66* 1.50* 1.43*  CALCIUM 7.9* 7.6* 7.5* 7.3*   GFR: Estimated Creatinine Clearance: 66.9 mL/min (A) (by C-G formula based on SCr of 1.43 mg/dL (H)). Liver Function Tests: Recent Labs  Lab 03/23/20 1339 03/23/20 1852 03/24/20 0312  AST 231* 209* 196*  ALT 252* 221* 209*  ALKPHOS 742* 614* 560*  BILITOT 5.5* 5.4* 5.5*  PROT 6.3* 6.2* 5.8*  ALBUMIN 2.2* 2.3* 2.2*   Recent Labs  Lab 03/23/20 1852  LIPASE 35   Recent Labs  Lab 03/23/20 2207  AMMONIA 35   Coagulation Profile: Recent Labs  Lab 03/20/20 1115 03/21/20 0750 03/23/20 1340  INR 1.7* 1.4* 1.7*   Cardiac Enzymes: No results for input(s): CKTOTAL, CKMB, CKMBINDEX, TROPONINI in the last 168 hours. BNP (last 3 results) No results for input(s): PROBNP in the last 8760 hours. HbA1C: No results for input(s): HGBA1C in the last 72 hours. CBG: No results for input(s): GLUCAP in the last 168 hours. Lipid Profile: No results for  input(s): CHOL, HDL, LDLCALC, TRIG, CHOLHDL, LDLDIRECT in the last 72 hours. Thyroid Function Tests: No results for input(s): TSH, T4TOTAL, FREET4, T3FREE, THYROIDAB in the last 72 hours. Anemia Panel: Recent Labs    03/23/20 1340  FERRITIN 11,196*  TIBC 168*  IRON 23*   Sepsis Labs: Recent Labs  Lab 03/23/20 1853 03/23/20 2300  LATICACIDVEN 3.6* 3.6*    No results found for this or any previous visit (from the past 240 hour(s)).       Radiology Studies: CT ABDOMEN PELVIS W CONTRAST  Result Date: 03/23/2020 CLINICAL DATA:  Abdominal distension, abdominal pain with elevated LFTs with known metastatic disease to the liver. EXAM: CT ABDOMEN AND PELVIS WITH CONTRAST TECHNIQUE: Multidetector CT imaging of the abdomen and pelvis was performed using the standard protocol following bolus administration of intravenous contrast. CONTRAST:  171mL OMNIPAQUE IOHEXOL 300 MG/ML  SOLN COMPARISON:  03/01/2020 FINDINGS: Lower chest: Interval development of RIGHT-sided effusion and basilar airspace disease since the prior study. Calcified coronary artery disease. No pericardial effusion. Heart is incompletely imaged. Minimal atelectasis at the LEFT lung base. Hepatobiliary: Multifocal hepatic metastatic lesions now  more confluent and enlarging when compared to the prior study. (Image 26, series 2) 3.5 cm lesion previously 2.9 cm. Near confluent disease in the LEFT hepatic lobe on today's exam. Area of confluent disease in the RIGHT hepatic lobe (image 39, series 2) 5.0 cm at most 3.7 cm on the prior study and without discrete intervening normal hepatic parenchyma between lesions in this area. Portal vein is patent. Gallbladder is collapsed. Question of lack of enhancement of the medial wall of the gallbladder. In the current context the significance is uncertain the gallbladder could be collapse upon itself. Signs of ascites and small pericholecystic fluid. Portal vein is patent. Pancreas: Pancreas is  normal without ductal dilation or inflammation. Spleen: Spleen normal size and contour. Adrenals/Urinary Tract: Adrenal glands are normal. Symmetric renal enhancement. No hydronephrosis. LEFT renal cyst arises from lateral LEFT kidney as before. Urinary bladder is normal to the extent evaluated aside from LEFT posterolateral bladder diverticulum. Stomach/Bowel: Stomach is under distended. Small bowel without overt signs of obstruction. Mildly dilated distal small bowel loops filled with liquid stool like material. Stool and gas filling the proximal colon. Colonic diverticulosis. No diverticulitis. The appendix is normal. Vascular/Lymphatic: Calcific and noncalcific atheromatous plaque of the abdominal aorta. Mild dilation without aneurysm. 2.9 cm greatest axial dimension. Retroperitoneal adenopathy. (Image 46, series 2) 1.3 cm previously 1 cm short axis. Periportal nodal enlargement is similar. Juxta crural lymph node and hepatic gastric lymph nodes are similar. Reproductive: Prostate is unremarkable by CT. Other: Post LEFT inguinal herniorrhaphy. Ascites which is new from the prior study. No signs of free air. Musculoskeletal: Spinal degenerative changes. No acute or destructive bone process. IMPRESSION: 1. Given new ascites and lack of gallbladder wall enhancement with collapse of the gallbladder since previous imaging studies would suggest focused assessment with gallbladder ultrasound to ensure gallbladder wall integrity. HIDA scan is likely to be un informative at this time due to hepatic dysfunction. 2. Multifocal hepatic metastatic lesions now more confluent and enlarging when compared to the prior study. 3. New small volume ascites is more likely related to worsening hepatic dysfunction in the setting of extensive hepatic metastatic disease. 4. Interval development of RIGHT-sided effusion and basilar airspace disease since the prior study. 5. Colonic diverticulosis without diverticulitis. 6. Aortic  atherosclerosis. Aortic Atherosclerosis (ICD10-I70.0). Electronically Signed   By: Zetta Bills M.D.   On: 03/23/2020 20:17   US Abdomen Limited RUQ  Result Date: 03/23/2020 CLINICAL DATA:  Hepatic metastatic disease, abnormal gallbladder on CT EXAM: ULTRASOUND ABDOMEN LIMITED RIGHT UPPER QUADRANT COMPARISON:  03/23/2020 FINDINGS: Gallbladder: Not well visualized. The gallbladder was completely decompressed on preceding CT. Common bile duct: Diameter: 5 mm Liver: Numerous hypoechoic masses are seen throughout the liver parenchyma consistent with known metastatic disease. No intrahepatic duct dilation. Portal vein is patent on color Doppler imaging with normal direction of blood flow towards the liver. Other: None. IMPRESSION: 1. Diffuse liver metastases. 2. Nonvisualization of the gallbladder due to decompressed state. Electronically Signed   By: Randa Ngo M.D.   On: 03/23/2020 22:19        Scheduled Meds: . amLODipine  10 mg Oral Daily  . aspirin EC  81 mg Oral Daily  . azithromycin  250 mg Oral Daily  . clopidogrel  75 mg Oral QPM  . fluticasone furoate-vilanterol  1 puff Inhalation Daily  . montelukast  10 mg Oral Daily  . pantoprazole  40 mg Oral Daily  . predniSONE  20 mg Oral Q breakfast  . spironolactone  25 mg Oral Daily   Continuous Infusions:   LOS: 1 day   Time spent= 35 mins    Raju Coppolino Arsenio Loader, MD [facility]  If 7PM-7AM, please contact night-coverage  03/24/2020, 7:42 AM

## 2020-03-24 NOTE — Plan of Care (Signed)
Plan of care reviewed and discussed with the patient. 

## 2020-03-24 NOTE — Progress Notes (Signed)
Verbal orders given by Damita Lack, MD to change the diet from NPO to NPO x sips with meds.

## 2020-03-24 NOTE — Progress Notes (Signed)
Hold Plavix per Damita Lack, MD until further notice.

## 2020-03-24 NOTE — Progress Notes (Addendum)
HEMATOLOGY-ONCOLOGY PROGRESS NOTE  SUBJECTIVE: Mr. Spong was sent from the cancer center to the ER for abnormal lab work.  Labs from our office showed a sodium of 123, BUN 39, creatinine 1.54, AST 231, ALT 252, alkaline phosphatase 742, total bilirubin 5.5.  CT of the abdomen pelvis showed new ascites, multifocal hepatic metastatic lesions now more confluent and enlarging compared to prior study, interval development of right-sided effusion.  Right upper quadrant abdominal ultrasound showed diffuse liver metastases.  The patient has been undergoing work-up for his liver masses concerning for malignancy.  Liver biopsy attempted on 03/20/2020 but was aborted secondary to tachycardia, dyspnea, and leukocytosis as well as an elevated INR.  IR has been consulted for paracentesis versus liver biopsy to be performed today.  The patient is still having a lot of fatigue and weakness.  Family members at the bedside states that he sleeps a lot.  He reports abdominal discomfort particularly in his upper abdomen and over the right upper quadrant.  He is not having any chest pain or shortness of breath today.  No bleeding reported.  REVIEW OF SYSTEMS:   Constitutional: Has had intermittent fevers at home prior to admission Eyes: Notices yellowing of his eyes Ears, nose, mouth, throat, and face: Denies mucositis or sore throat Respiratory: Denies cough, dyspnea or wheezes Cardiovascular: Denies palpitation, chest discomfort Gastrointestinal: Denies nausea and vomiting but reports abdominal distention and pain in his upper abdomen and right upper quadrant Skin: Denies abnormal skin rashes Lymphatics: Denies new lymphadenopathy or easy bruising Neurological:Denies numbness, tingling or new weaknesses Behavioral/Psych: Mood is stable, no new changes  Extremities: No lower extremity edema All other systems were reviewed with the patient and are negative.  I have reviewed the past medical history, past surgical  history, social history and family history with the patient and they are unchanged from previous note.   PHYSICAL EXAMINATION: ECOG PERFORMANCE STATUS: 2 - Symptomatic, <50% confined to bed  Vitals:   03/24/20 0902 03/24/20 0905  BP:    Pulse:    Resp:    Temp:    SpO2: 95% 95%   Filed Weights   03/23/20 1611 03/24/20 0325  Weight: 107.5 kg 107.5 kg    Intake/Output from previous day: 06/17 0701 - 06/18 0700 In: 1000 [IV Piggyback:1000] Out: 0   GENERAL: Chronically ill-appearing male, no distress SKIN: skin color, texture, turgor are normal, no rashes or significant lesions EYES: Scleral icterus noted OROPHARYNX:no exudate, no erythema and lips, buccal mucosa, and tongue normal  LUNGS: clear to auscultation and percussion with normal breathing effort HEART: regular rate & rhythm and no murmurs and no lower extremity edema ABDOMEN: Positive bowel sounds, distended, tenderness to palpation over right upper quadrant NEURO: alert & oriented x 3 with fluent speech, no focal motor/sensory deficits  LABORATORY DATA:  I have reviewed the data as listed CMP Latest Ref Rng & Units 03/24/2020 03/23/2020 03/23/2020  Glucose 70 - 99 mg/dL 128(H) 96 105(H)  BUN 8 - 23 mg/dL 41(H) 42(H) 45(H)  Creatinine 0.61 - 1.24 mg/dL 1.43(H) 1.50(H) 1.66(H)  Sodium 135 - 145 mmol/L 123(L) 125(L) 122(L)  Potassium 3.5 - 5.1 mmol/L 4.6 5.4(H) 5.0  Chloride 98 - 111 mmol/L 91(L) 90(L) 87(L)  CO2 22 - 32 mmol/L 20(L) 20(L) 21(L)  Calcium 8.9 - 10.3 mg/dL 7.3(L) 7.5(L) 7.6(L)  Total Protein 6.5 - 8.1 g/dL 5.8(L) - 6.2(L)  Total Bilirubin 0.3 - 1.2 mg/dL 5.5(H) - 5.4(H)  Alkaline Phos 38 - 126 U/L 560(H) -  614(H)  AST 15 - 41 U/L 196(H) - 209(H)  ALT 0 - 44 U/L 209(H) - 221(H)    Lab Results  Component Value Date   WBC 16.9 (H) 03/24/2020   HGB 12.3 (L) 03/24/2020   HCT 36.6 (L) 03/24/2020   MCV 77.4 (L) 03/24/2020   PLT 171 03/24/2020   NEUTROABS 14.9 (H) 03/23/2020    DG Chest 2  View  Result Date: 03/20/2020 CLINICAL DATA:  Chest pain and shortness of breath EXAM: CHEST - 2 VIEW COMPARISON:  Mar 01, 2016 FINDINGS: There is bibasilar atelectatic change. The lungs elsewhere are clear. The heart size and pulmonary vascularity are normal. No adenopathy. There is aortic atherosclerosis. Loop recorder present on the left anteriorly. There is degenerative change in the thoracic spine. IMPRESSION: Bibasilar atelectasis. No edema or airspace opacity. Stable cardiac silhouette. Aortic Atherosclerosis (ICD10-I70.0). Electronically Signed   By: Lowella Grip III M.D.   On: 03/20/2020 13:11   DG Chest 2 View  Result Date: 03/01/2020 CLINICAL DATA:  Chest pain EXAM: CHEST - 2 VIEW COMPARISON:  May 03, 2019 FINDINGS: There is mild bibasilar atelectasis. Lungs otherwise are clear. The heart size and pulmonary vascularity are normal. No adenopathy. There are foci left anterior descending coronary artery calcification. There is aortic atherosclerosis. There is degenerative change in the thoracic spine. There is a loop recorder on the left anteriorly. IMPRESSION: Bibasilar atelectasis.  Lungs otherwise clear.  Heart size normal. Coronary artery calcification noted. Aortic Atherosclerosis (ICD10-I70.0). Loop recorder on left anteriorly. Electronically Signed   By: Lowella Grip III M.D.   On: 03/01/2020 10:23   CT Angio Chest PE W/Cm &/Or Wo Cm  Result Date: 03/01/2020 CLINICAL DATA:  Shortness of breath. Concern for pulmonary embolism. Positive D-dimer. EXAM: CT ANGIOGRAPHY CHEST WITH CONTRAST TECHNIQUE: Multidetector CT imaging of the chest was performed using the standard protocol during bolus administration of intravenous contrast. Multiplanar CT image reconstructions and MIPs were obtained to evaluate the vascular anatomy. CONTRAST:  22mL OMNIPAQUE IOHEXOL 350 MG/ML SOLN COMPARISON:  None. FINDINGS: Cardiovascular: Contrast injection is sufficient to demonstrate satisfactory  opacification of the pulmonary arteries to the segmental level. There is no pulmonary embolus. The main pulmonary artery is within normal limits for size. There is no CT evidence of acute right heart strain. There are atherosclerotic changes of the visualized thoracic aorta. The thoracic aorta is borderline aneurysmal measuring approximately 4 cm in diameter. Heart size is coronary artery calcifications are noted. Mediastinum/Nodes: --there are mildly enlarged mediastinal lymph nodes. For example there is a right paratracheal lymph node measuring approximately 1.5 cm (axial series 5, image 52). --No axillary lymphadenopathy. --No supraclavicular lymphadenopathy. --Normal thyroid gland. --The esophagus is unremarkable Lungs/Pleura: There is no pneumothorax. Atelectasis is noted at the lung bases. There is no significant pleural effusion. There is a nodule along the major fissure on the left favored to represent a small lymph node. Upper Abdomen: There are innumerable low-attenuation masses throughout the patient's liver. There are few mildly enlarged lymph nodes in the upper abdomen. Musculoskeletal: No chest wall abnormality. No acute or significant osseous findings. Review of the MIP images confirms the above findings. IMPRESSION: 1. No acute pulmonary embolism. 2. There are innumerable masses in the patient's liver concerning for metastatic disease, currently with an unknown primary. 3. There are mildly enlarged mediastinal lymph nodes which may be reactive or metastatic. Attention on follow-up examinations is recommended. 4. Bibasilar atelectasis is noted. Aortic Atherosclerosis (ICD10-I70.0). Electronically Signed   By: Harrell Gave  Green M.D.   On: 03/01/2020 19:03   CT ABDOMEN PELVIS W CONTRAST  Result Date: 03/23/2020 CLINICAL DATA:  Abdominal distension, abdominal pain with elevated LFTs with known metastatic disease to the liver. EXAM: CT ABDOMEN AND PELVIS WITH CONTRAST TECHNIQUE: Multidetector CT  imaging of the abdomen and pelvis was performed using the standard protocol following bolus administration of intravenous contrast. CONTRAST:  144mL OMNIPAQUE IOHEXOL 300 MG/ML  SOLN COMPARISON:  03/01/2020 FINDINGS: Lower chest: Interval development of RIGHT-sided effusion and basilar airspace disease since the prior study. Calcified coronary artery disease. No pericardial effusion. Heart is incompletely imaged. Minimal atelectasis at the LEFT lung base. Hepatobiliary: Multifocal hepatic metastatic lesions now more confluent and enlarging when compared to the prior study. (Image 26, series 2) 3.5 cm lesion previously 2.9 cm. Near confluent disease in the LEFT hepatic lobe on today's exam. Area of confluent disease in the RIGHT hepatic lobe (image 39, series 2) 5.0 cm at most 3.7 cm on the prior study and without discrete intervening normal hepatic parenchyma between lesions in this area. Portal vein is patent. Gallbladder is collapsed. Question of lack of enhancement of the medial wall of the gallbladder. In the current context the significance is uncertain the gallbladder could be collapse upon itself. Signs of ascites and small pericholecystic fluid. Portal vein is patent. Pancreas: Pancreas is normal without ductal dilation or inflammation. Spleen: Spleen normal size and contour. Adrenals/Urinary Tract: Adrenal glands are normal. Symmetric renal enhancement. No hydronephrosis. LEFT renal cyst arises from lateral LEFT kidney as before. Urinary bladder is normal to the extent evaluated aside from LEFT posterolateral bladder diverticulum. Stomach/Bowel: Stomach is under distended. Small bowel without overt signs of obstruction. Mildly dilated distal small bowel loops filled with liquid stool like material. Stool and gas filling the proximal colon. Colonic diverticulosis. No diverticulitis. The appendix is normal. Vascular/Lymphatic: Calcific and noncalcific atheromatous plaque of the abdominal aorta. Mild dilation  without aneurysm. 2.9 cm greatest axial dimension. Retroperitoneal adenopathy. (Image 46, series 2) 1.3 cm previously 1 cm short axis. Periportal nodal enlargement is similar. Juxta crural lymph node and hepatic gastric lymph nodes are similar. Reproductive: Prostate is unremarkable by CT. Other: Post LEFT inguinal herniorrhaphy. Ascites which is new from the prior study. No signs of free air. Musculoskeletal: Spinal degenerative changes. No acute or destructive bone process. IMPRESSION: 1. Given new ascites and lack of gallbladder wall enhancement with collapse of the gallbladder since previous imaging studies would suggest focused assessment with gallbladder ultrasound to ensure gallbladder wall integrity. HIDA scan is likely to be un informative at this time due to hepatic dysfunction. 2. Multifocal hepatic metastatic lesions now more confluent and enlarging when compared to the prior study. 3. New small volume ascites is more likely related to worsening hepatic dysfunction in the setting of extensive hepatic metastatic disease. 4. Interval development of RIGHT-sided effusion and basilar airspace disease since the prior study. 5. Colonic diverticulosis without diverticulitis. 6. Aortic atherosclerosis. Aortic Atherosclerosis (ICD10-I70.0). Electronically Signed   By: Zetta Bills M.D.   On: 03/23/2020 20:17   CT ABDOMEN PELVIS W CONTRAST  Result Date: 03/01/2020 CLINICAL DATA:  Chest CTA with findings suspicious for hepatic metastatic disease. EXAM: CT ABDOMEN AND PELVIS WITH CONTRAST TECHNIQUE: Multidetector CT imaging of the abdomen and pelvis was performed using the standard protocol following bolus administration of intravenous contrast. CONTRAST:  86mL OMNIPAQUE IOHEXOL 300 MG/ML  SOLN COMPARISON:  Chest CTA earlier today. Abdominopelvic CT 08/17/2018 FINDINGS: Lower chest: Assessed on chest CT earlier today.  Right greater than left basilar atelectasis. Pleural thickening without significant effusion.  Hepatobiliary: Innumerable low-density lesions scattered throughout the liver parenchyma most consistent with diffuse hepatic metastatic disease. Majority of the lesions are 1-2 cm. Largest lesion in the right hepatic dome measures 2.4 cm. Gallbladder physiologically distended, no calcified stone. No biliary dilatation. Pancreas: No ductal dilatation or inflammation. No evidence of pancreatic mass. Spleen: Normal in size without focal abnormality. Adrenals/Urinary Tract: Mild thickening of the lateral left adrenal gland is unchanged from prior exam. The right adrenal gland is normal. There is no suspicious adrenal nodule. No hydronephrosis. There is symmetric bilateral perinephric edema. There is a 3.3 cm exophytic low-density lesion from the posterior left kidney that likely represents a minimally complex cyst. This is not significantly changed in size from prior exam. Excreted IV contrast within both renal collecting systems which limits assessment for stone. Probable parapelvic cyst in the left kidney. There is excreted IV contrast within the urinary bladder without evidence of bladder wall thickening or focal lesion. Stomach/Bowel: Detailed bowel evaluation is limited in the absence of enteric contrast. Mild wall thickening of the distal esophagus. Unopacified stomach is unremarkable. Normal positioning of the ligament of Treitz. Small duodenal diverticulum. No small bowel inflammation or obstruction. No obvious small bowel mass. Normal appendix. Moderate volume of stool throughout the colon. Diverticulosis from the splenic flexure distally. No diverticulitis. Few areas of nondistention involving the sigmoid colon likely related to peristalsis, no obvious colonic mass. Vascular/Lymphatic: There is a 12 mm portal caval node, series 3, image 24. Suspected 14 mm retrocrural node adjacent to the distal esophagus, series 3, image 15. clustered prominent nodes adjacent to the celiac axis measuring up to 10 mm, series  3, image 22. 8 mm SMA node, series 3, image 28. There is left periaortic retroperitoneal adenopathy, with nodes measuring up to 13 mm, series 3, image 37. No definite enlarged pelvic lymph nodes. Ectatic infrarenal aorta measuring 2.7 cm. Aortic atherosclerosis. The portal vein is patent. Reproductive: Prostate is unremarkable. Other: Trace fluid adjacent to the inferior liver tip and edema in the right pericolic gutter. No significant ascites. No omental thickening or nodularity. No free air. Prior left inguinal hernia repair. There is fat in the right inguinal canal. Musculoskeletal: No blastic osseous lesion. No evidence of destructive lytic lesion. Multilevel degenerative change in the spine. No suspicious subcutaneous or abdominal wall lesion. IMPRESSION: 1. Innumerable low-density lesions scattered throughout the liver parenchyma most consistent with diffuse hepatic metastatic disease. 2. Retroperitoneal and upper abdominal adenopathy, suspicious for metastatic disease. 3. Primary malignancy is not definitively characterized, however there is wall thickening of the distal esophagus which raises concern for occult esophageal primary. Recommend further evaluation with endoscopy. 4. Colonic diverticulosis without diverticulitis. Areas of nondistention of the sigmoid colon without dominant colonic mass. 5. Ectatic abdominal aorta at risk for aneurysm development, maximal dimension 2.7 cm. Recommend followup by ultrasound in 5 years. This recommendation follows ACR consensus guidelines: White Paper of the ACR Incidental Findings Committee II on Vascular Findings. J Am Coll Radiol 2013; 10:789-794. Aortic aneurysm NOS (ICD10-I71.9) Aortic Atherosclerosis (ICD10-I70.0). Electronically Signed   By: Keith Rake M.D.   On: 03/01/2020 21:30   US Abdomen Limited RUQ  Result Date: 03/23/2020 CLINICAL DATA:  Hepatic metastatic disease, abnormal gallbladder on CT EXAM: ULTRASOUND ABDOMEN LIMITED RIGHT UPPER QUADRANT  COMPARISON:  03/23/2020 FINDINGS: Gallbladder: Not well visualized. The gallbladder was completely decompressed on preceding CT. Common bile duct: Diameter: 5 mm Liver: Numerous hypoechoic masses are  seen throughout the liver parenchyma consistent with known metastatic disease. No intrahepatic duct dilation. Portal vein is patent on color Doppler imaging with normal direction of blood flow towards the liver. Other: None. IMPRESSION: 1. Diffuse liver metastases. 2. Nonvisualization of the gallbladder due to decompressed state. Electronically Signed   By: Randa Ngo M.D.   On: 03/23/2020 22:19    ASSESSMENT AND PLAN: 1.  Liver masses concerning for malignancy; biopsy pending 2.  Liver dysfunction 3.  Hyponatremia 4.  AKI 5.  Questionable COPD exacerbation 6.  Hypertension 7.  History of CVA 8.  History of CAD status post stent 9.  Leukocytosis 10.  Anemia  -Discussed with the patient and his family member that we need biopsy to obtain diagnosis.  We discussed paracentesis versus liver biopsy.  Hospitalist has spoken with interventional radiology who may consider proceeding with liver biopsy today.  His Plavix was placed on hold this morning in anticipation of possible liver biopsy.  If liver biopsy cannot be obtained, agree with paracentesis and send for cytology.  We may be able to get diagnosis from cytology. -Liver dysfunction likely related to liver masses.  Monitor. -Leukocytosis likely reactive.  Recommend close monitoring.  I also note that he is on prednisone for possible COPD exacerbation.  He is also on antibiotics for possible COPD exacerbation. -Anemia is mild.  No transfusion indicated as hemoglobin is less than 8 or active bleeding. -Management of other medical conditions per hospitalist.   LOS: 1 day   Mikey Bussing, DNP, AGPCNP-BC, AOCNP 03/24/20   Addendum  I have seen the patient, examined him. I agree with the assessment and and plan and have edited the notes.   I  have reviewed his recent CT scan images in person. Again I see diffuse small liver metastasis, and no obvious primary tumor.  I recommend liver biopsy, patient is agreeable.  I have spoken to IR Dr. Pascal Lux, and he will do liver biopsy today. Pt is on plavix and has elevated INR from his liver dysfunction, he is at high risk for bleeding from biopsy.  I discussed with patient.  I think the yield from cytology of paracentesis is low.  Patient's total bilirubin was normal 3 weeks ago, now has significant gotten worse in the past week with total bilirubin of 5.5, no significant biliary obstruction.  I have reached out to  GI Dr. Bryan Lemma and he will see pt today. Pt was evaluated by Dr. Henrene Pastor in office one week ago.   I discussed the treatment of his metastatic cancer with chemotherapy will be limited by his significant liver dysfunction, and his overall poor performance status.  There is a possibility that we may not be able to offer any treatment if his liver function continues getting worse and his PS declines further.  His prognosis and treatment options will be determined by the type of malignancy.  Were discussed with patient and his wife as soon as we have the biopsy results.  I will be out of the office next week, and have signed out his case to my partners, Dr. Benay Spice will see him next week when his biopsy returns.   I answered all their questions, pt and his wife obviously are stressed out. Please consult SW for counseling if needed.  Truitt Merle  03/24/2020

## 2020-03-24 NOTE — Consult Note (Addendum)
Referring Provider: Dr. Gerlean Ren  Primary Care Physician:  [name], [name], MD Primary Gastroenterologist:  Dr. Scarlette Shorts  Reason for Consultation:  Elevated total bilirubin levels, liver metastasis   HPI: Charles Mccoy is a 69 y.o. male with a past medical history of asthma, COPD, hypertension, hyperlipidemia, coronary artery disease NSTEMI  with DES to LAD 04/2019 on Plavix, pacemaker, CVA 01/2018, chronic back pain and GERD.  He developed sharp right chest and RUQ pain on 03/01/2020. He presented to [facility] ER for further evaluation.  Laboratory studies showed alk phos level of 134.  AST 39.  ALT 48.  Total bili 0.8.  WBC 8.9.  Hemoglobin 13.8.  Hematocrit 41.8.  A  chest CT angiogram did not show any evidence of a PE.  However, there were innumerable masses in the patient's liver concerning for metastatic disease.  He was also noted to have large mediastinal lymph nodes.  He subsequently underwent an abdomen/pelvis CT which revealed similar changes in the liver as well as retroperitoneal and upper abdominal adenopathy.  Thickening of the distal esophagus was noted raising the question of esophageal carcinoma.  The colon was normal except for diverticulosis.  He was discharged home with instructions to follow-up with his primary care physician.  He was evaluated by Dr. [name] on 03/03/2020 and arrangements for GI follow-up and oncology consult were scheduled.  He was seen in our office by Dr. Henrene Pastor on 03/16/2020 to consider scheduling an EGD due to the distal esophageal thickening seen on CT.  Dr. Henrene Pastor recommended a liver biopsy prior to pursing an EGD. Dr. Burr Medico arranged a liver biopsy with IR on 03/20/2020, however, the liver biopsy was canceled due to the patient having tachycardia, SOB, leukocytosis and an elevated INR.  Plavix was held for 5 days prior to the attempted liver biopsy.  He was seen by oncology NP  Cira Rue in office 03/21/2020 and he was treated with Prednisone and a  Zpak for COPD exacerbation. He received oral vitamin  K 2.14m x 1 with plans to reschedule his liver biopsy if his INR improved. Goal INR for liver biopsy was  1.5. Laboratory studies done during his office visit showed AKI, transaminitis, hyperbilirubinemia and leukocytosis (on Prednisone). [facility]. In the ED, his creatinine was 1.54.  WBC 20.7.  INR 1.7.  Alk phos 742.  AST 231.  ALT 252.  Total bili 5.5.  Lipase 35.  An abdominal/pelvic CT scan showed a new small volume ascites and lack of gallbladder wall enhancement with multifocal hepatic metastasis lesions which appeared more confluent and enlarging when compared to the prior study in May.  He underwent a liver biopsy earlier today, results pending.  His last dose of Plavix was taken this morning at 12:35am. He is on ASA 867mdaily.  GI consult was requested due to his elevated total bilirubin levels with elevated alk phos and LFTs.   Currently, he is fatigued after having the liver biopsy completed.  He denies having any nausea or vomiting.  He has infrequent heartburn, takes Pantoprazole 4059maily. No dysphagia.  He continues to have right upper quadrant abdominal pain but it is no longer severe.  He reports passing normal formed brown bowel movements most days.  No rectal bleeding or melena.  His recent colonoscopy was 09/2014 which showed diverticulosis, no polyps.  He drinks 1-2 beers daily.  He quit smoking 25 years ago.  His father had cancer  to his larynx.  No family history of esophageal, gastric, colon, liver or pancreatic cancer.  He has lost 10 pounds over the past month.  He has a persistent dry cough.  His recent COPD exacerbation has been slow to improve.  No hemoptysis.  No other complaints at this time.  Wife is at the bedside.   ED Course: Sodium 123.  Potassium 4.8.  Glucose 101.  BUN 39.  Creatinine 1.54 -> 1.66 (Cr 0.86 on 5/26).  Calcium 7.9.  Alk phos 742.  Albumin 2.2.  AST 231.  ALT  252.  Total bili 5.5.  Lipase 35.  LDH 647.  WBC 20.7.  Hemoglobin 13.2.  Hematocrit 38.7.  Platelet 226.  Iron 23.  TIBC 168.  Ferritin 11,000 196.  INR 1.7.  Lactic acid 3.6.  Laboratory studies 03/24/2020: Sodium 123.  Potassium 4.6.  Glucose 128.  BUN 41.  Creatinine 1.43.  Alk phos 560.  Albumin 2.2.  AST 196.  ALT 209.  Total bili 5.5.  BBC 16.9.  Hemoglobin 12.3.  Platelet 171.  INR 1.6.  SARS coronavirus 2 Negative.   Abdominal/Pelvic CT with contrast 03/23/2020: 1. Given new ascites and lack of gallbladder wall enhancement with collapse of the gallbladder since previous imaging studies would suggest focused assessment with gallbladder ultrasound to ensure gallbladder wall integrity. HIDA scan is likely to be un informative at this time due to hepatic dysfunction. 2. Multifocal hepatic metastatic lesions now more confluent and enlarging when compared to the prior study. 3. New small volume ascites is more likely related to worsening hepatic dysfunction in the setting of extensive hepatic metastatic disease. 4. Interval development of RIGHT-sided effusion and basilar airspace disease since the prior study. 5. Colonic diverticulosis without diverticulitis. 6. Aortic atherosclerosis  Abdominal/pelvic CT with contrast 03/01/2020: 1. Innumerable low-density lesions scattered throughout the liver parenchyma most consistent with diffuse hepatic metastatic disease. 2. Retroperitoneal and upper abdominal adenopathy, suspicious for metastatic disease. 3. Primary malignancy is not definitively characterized, however there is wall thickening of the distal esophagus which raises concern for occult esophageal primary. Recommend further evaluation with endoscopy. 4. Colonic diverticulosis without diverticulitis. Areas of nondistention of the sigmoid colon without dominant colonic mass. 5. Ectatic abdominal aorta at risk for aneurysm development, maximal dimension 2.7 cm. Recommend followup by  ultrasound in 5 years. This recommendation follows ACR consensus guidelines: White Paper of the ACR Incidental Findings Committee II on Vascular Findings. J Am Coll Radiol 2013; 10:789-794. Aortic aneurysm NOS   Chest CT angiogram 03/01/2020:  1. No acute pulmonary embolism. 2. There are innumerable masses in the patient's liver concerning for metastatic disease, currently with an unknown primary. 3. There are mildly enlarged mediastinal lymph nodes which may be reactive or metastatic. Attention on follow-up examinations is recommended. 4. Bibasilar atelectasis is noted. 5. Aortic Atherosclerosis   Colonoscopy 09/08/2014 by Dr. Scarlette Shorts: 1. Moderate diverticulosis was noted in the left colon 2. The examination was otherwise normal 3.  Recall  colonoscopy in 10 years  Echo 05/03/2019: 1. The left ventricle has hyperdynamic systolic function, with an  ejection fraction of >65%. The cavity size was normal. There is mildly  increased left ventricular wall thickness. Left ventricular diastolic  Doppler parameters are consistent with  impaired relaxation. No evidence of left ventricular regional wall motion  abnormalities.  2. The right ventricle has normal systolic function. The cavity was  normal. There is no increase in right ventricular wall thickness.  3. The mitral valve is grossly normal.  4. The tricuspid valve is grossly normal.  5. The aortic valve was not well visualized. No stenosis of the aortic  valve.  6. The aorta is normal in size and structure.  7. The inferior vena cava was dilated in size with <50% respiratory  variability.    Past Medical History:  Diagnosis Date  . ALLERGIC RHINITIS 06/19/2007  . ASTHMA 08/17/2008  . Back pain 05/2016  . CAD (coronary artery disease)    05/03/19- NSTEMI, 05/04/19 DES to LAD  . COPD (chronic obstructive pulmonary disease) ([place])   . GERD (gastroesophageal reflux disease)   . Hyperlipidemia   . HYPERTENSION 06/19/2007    . OSTEOARTHRITIS 10/09/2009   wrists, knee.  . Pneumonia    hx of   . SINUSITIS 09/15/2007   recent issuses 2 weeks ago-clearing.  . Stroke [facility])     Past Surgical History:  Procedure Laterality Date  . CARDIAC CATHETERIZATION    . CORONARY STENT INTERVENTION N/A 05/04/2019   Procedure: CORONARY STENT INTERVENTION;  Surgeon: Leonie Man, MD;  Location: [place] [facility];  Service: Cardiovascular;  Laterality: N/A;  . cyst removed from neck      age 65   . KNEE ARTHROSCOPY     2 on left knee , 1 on right  . LEFT HEART CATH AND CORONARY ANGIOGRAPHY N/A 05/04/2019   Procedure: LEFT HEART CATH AND CORONARY ANGIOGRAPHY;  Surgeon: Leonie Man, MD;  Location: [place] [facility];  Service: Cardiovascular;  Laterality: N/A;  . LOOP RECORDER INSERTION N/A 01/28/2018   Procedure: LOOP RECORDER INSERTION;  Surgeon: Evans Lance, MD;  Location: [place] [facility];  Service: Cardiovascular;  Laterality: N/A;  . TEE WITHOUT CARDIOVERSION N/A 01/28/2018   Procedure: TRANSESOPHAGEAL ECHOCARDIOGRAM (TEE);  Surgeon: Acie Fredrickson Wonda Cheng, MD;  Location: [facility] ENDOSCOPY;  Service: Cardiovascular;  Laterality: N/A;  . TOTAL KNEE ARTHROPLASTY Left 07/21/2014   Procedure: LEFT TOTAL KNEE ARTHROPLASTY;  Surgeon: Johnn Hai, MD;  Location: [facility] ORS;  Service: Orthopedics;  Laterality: Left;  . TOTAL KNEE ARTHROPLASTY Right 05/30/2016   Procedure: RIGHT TOTAL KNEE ARTHROPLASTY REPLACEMENT;  Surgeon: Susa Day, MD;  Location: [facility] ORS;  Service: Orthopedics;  Laterality: Right;    Prior to Admission medications   Medication Sig Start Date End Date Taking? Authorizing Provider  albuterol (VENTOLIN HFA) 108 (90 Base) MCG/ACT inhaler Inhale 1 puff into the lungs every 4 (four) hours as needed for wheezing or shortness of breath. Use as directed 05/11/19  Yes [provider]  amLODipine (NORVASC) 10 MG tablet TAKE 1 TABLET BY MOUTH EVERY DAY IN THE MORNING Patient taking differently: Take 10 mg by  mouth daily.  12/01/19  Yes [name], [name], MD  aspirin 81 MG EC tablet Take 1 tablet (81 mg total) by mouth daily. 05/17/19  Yes Daune Perch, NP  atorvastatin (LIPITOR) 40 MG tablet Take 1 tablet (40 mg total) by mouth daily. 02/07/20  Yes [name], MD  azelastine (ASTELIN) 0.1 % nasal spray Place 1 spray into both nostrils as needed for allergies.  01/13/18  Yes [provider]  azithromycin (ZITHROMAX Z-PAK) 250 MG tablet Take as prescribed for package instructions Patient taking differently: Take 250-500 mg by mouth as directed. Take as prescribed for package instructions. Day 1: Take 2 tablets (500 mg) Then Days 2-5: Take 1 tablet Daily 03/21/20  Yes [name], NP  chlorpheniramine-HYDROcodone (TUSSIONEX) 10-8 MG/5ML SUER Take 5 mLs by mouth every 12 (twelve) hours  as needed for cough. 03/14/20  Yes [name], NP  clopidogrel (PLAVIX) 75 MG tablet Take 1 tablet (75 mg total) by mouth daily. Patient taking differently: Take 75 mg by mouth every evening.  11/08/19  Yes [name], MD  fexofenadine (ALLEGRA) 60 MG tablet Take 60 mg by mouth daily as needed for allergies.    Yes [provider]  Fluticasone-Salmeterol 232-14 MCG/ACT AEPB Inhale 1 puff into the lungs 2 (two) times daily.   Yes [provider]  montelukast (SINGULAIR) 10 MG tablet Take 10 mg by mouth every morning. 05/12/19  Yes [provider]  nitroGLYCERIN (NITROSTAT) 0.4 MG SL tablet Place 1 tablet (0.4 mg total) under the tongue every 5 (five) minutes as needed for chest pain. 05/05/19  Yes Furth, Cadence H, PA-C  oxyCODONE (OXY IR/ROXICODONE) 5 MG immediate release tablet Take 1 tablet (5 mg total) by mouth every 4 (four) hours as needed for severe pain. 03/03/20  Yes [name], [name], MD  pantoprazole (PROTONIX) 40 MG tablet TAKE 1 TABLET BY MOUTH EVERY DAY Patient taking differently: Take 40 mg by mouth daily.  11/09/19  Yes [name],  [name], MD  predniSONE (DELTASONE) 10 MG tablet Take 4 tablets once daily for 1 day, then take 3 tablets for 1 day, then take 2 tablets for 1 day, then take 1 tablet 03/21/20  Yes [name], NP  spironolactone (ALDACTONE) 25 MG tablet TAKE 1 TABLET BY MOUTH EVERY DAY Patient taking differently: Take 25 mg by mouth daily.  12/01/19  Yes [name], [name], MD  atorvastatin (LIPITOR) 80 MG tablet SMARTSIG:1 Tablet(s) By Mouth Every Evening Patient not taking: Reported on 03/23/2020 02/26/20   [provider]    Current Facility-Administered Medications  Medication Dose Route Frequency Provider Last Rate Last Admin  . amLODipine (NORVASC) tablet 10 mg  10 mg Oral Daily Tu, Ching T, DO   10 mg at 03/24/20 1034  . aspirin EC tablet 81 mg  81 mg Oral Daily Tu, Ching T, DO   81 mg at 03/24/20 1035  . azithromycin (ZITHROMAX) tablet 250 mg  250 mg Oral Daily Tu, Ching T, DO   250 mg at 03/24/20 1035  . fentaNYL (SUBLIMAZE) 100 MCG/2ML injection           . fluticasone furoate-vilanterol (BREO ELLIPTA) 200-25 MCG/INH 1 puff  1 puff Inhalation Daily Tu, Ching T, DO   1 puff at 03/24/20 0902  . gelatin adsorbable (GELFOAM/SURGIFOAM) 12-7 MM sponge 12-7 mm           . ipratropium-albuterol (DUONEB) 0.5-2.5 (3) MG/3ML nebulizer solution 3 mL  3 mL Nebulization Q4H PRN Amin, Ankit Chirag, MD      . lidocaine (XYLOCAINE) 1 % (with pres) injection           . midazolam (VERSED) 2 MG/2ML injection           . montelukast (SINGULAIR) tablet 10 mg  10 mg Oral Daily Tu, Ching T, DO   10 mg at 03/24/20 1035  . oxyCODONE (Oxy IR/ROXICODONE) immediate release tablet 5 mg  5 mg Oral Q4H PRN Tu, Ching T, DO      . pantoprazole (PROTONIX) EC tablet 40 mg  40 mg Oral Daily Tu, Ching T, DO   40 mg at 03/24/20 1035  . polyethylene glycol (MIRALAX / GLYCOLAX) packet 17 g  17 g Oral Daily PRN Damita Lack, MD      .  predniSONE (DELTASONE) tablet 20 mg  20 mg Oral Q breakfast Tu, Ching T, DO   20  mg at 03/24/20 0805  . senna-docusate (Senokot-S) tablet 2 tablet  2 tablet Oral QHS PRN Amin, Ankit Chirag, MD      . spironolactone (ALDACTONE) tablet 25 mg  25 mg Oral Daily Tu, Ching T, DO   25 mg at 03/24/20 1036    Allergies as of 03/23/2020 - Review Complete 03/23/2020  Allergen Reaction Noted  . Erythromycin base Other (See Comments)     Family History  Problem Relation Age of Onset  . Cancer Father        laryngeal   . Colon cancer Neg Hx   . Esophageal cancer Neg Hx   . Rectal cancer Neg Hx   . Stomach cancer Neg Hx     Social History   Socioeconomic History  . Marital status: Married    Spouse name: Not on file  . Number of children: 2  . Years of education: Not on file  . Highest education level: High school graduate  Occupational History  . Not on file  Tobacco Use  . Smoking status: Former Smoker    Types: Cigarettes    Quit date: 10/07/2001    Years since quitting: 18.4  . Smokeless tobacco: Never Used  Vaping Use  . Vaping Use: Never used  Substance and Sexual Activity  . Alcohol use: Not Currently    Alcohol/week: 6.0 standard drinks    Types: 6 Cans of beer per week    Comment: beer daily, none since 02/2020   . Drug use: No  . Sexual activity: Yes  Other Topics Concern  . Not on file  Social History Narrative  . Not on file   Social Determinants of Health   Financial Resource Strain: Low Risk   . Difficulty of Paying Living Expenses: Not hard at all  Food Insecurity: No Food Insecurity  . Worried About Charity fundraiser in the Last Year: Never true  . Ran Out of Food in the Last Year: Never true  Transportation Needs: No Transportation Needs  . Lack of Transportation (Medical): No  . Lack of Transportation (Non-Medical): No  Physical Activity: Sufficiently Active  . Days of Exercise per Week: 5 days  . Minutes of Exercise per Session: 30 min  Stress: No Stress Concern Present  . Feeling of Stress : Only a little  Social Connections:     . Frequency of Communication with Friends and Family:   . Frequency of Social Gatherings with Friends and Family:   . Attends Religious Services:   . Active Member of Clubs or Organizations:   . Attends Archivist Meetings:   Marland Kitchen Marital Status:   Intimate Partner Violence:   . Fear of Current or Ex-Partner:   . Emotionally Abused:   Marland Kitchen Physically Abused:   . Sexually Abused:     Review of Systems: The HPI, all other systems reviewed and are negative  Physical Exam: Vital signs in last 24 hours: Temp:  [97.7 F (36.5 C)-98.4 F (36.9 C)] 98 F (36.7 C) (06/18 0450) Pulse Rate:  [66-107] 71 (06/18 1330) Resp:  [11-25] 14 (06/18 1330) BP: (104-146)/(64-93) 117/78 (06/18 1330) SpO2:  [87 %-98 %] 97 % (06/18 1330) Weight:  [107.5 kg] 107.5 kg (06/18 0325) Last BM Date: 03/23/20   General:  Alert fatigued appearing male in  NAD. Head:  Normocephalic and atraumatic. Eyes:  Mild scleral icterus. Conjunctiva  pink. Ears:  Normal auditory acuity. Nose:  No deformity, discharge or lesions. Mouth:  Dentition intact. No ulcers or lesions.  Neck:  Supple. No lymphadenopathy or thyromegaly.  Lungs: Breath sounds clear throughout.  Heart:  RRR, no murmurs.  Abdomen: Large abdomen, RUQ liver biopsy site intact. No palpable mass. Small amount of ascites. Hypoactive bowel sounds x 4 quads.   Rectal: Deferred. Musculoskeletal:  Symmetrical without gross deformities.  Pulses:  Normal pulses noted. Extremities:  Without clubbing or edema. Neurologic:  Alert and  oriented x4. No focal deficits.  Skin:  Intact without significant lesions or rashes. Psych:  Alert and cooperative. Normal mood and affect.  Intake/Output from previous day: 06/17 0701 - 06/18 0700 In: 1000 [IV Piggyback:1000] Out: 0  Intake/Output this shift: Total I/O In: 60 [P.O.:60] Out: 700 [Urine:700]  Lab Results: Recent Labs    03/23/20 1339 03/23/20 1852 03/24/20 0312  WBC 20.7* 16.9* 16.9*  HGB 13.2  12.7* 12.3*  HCT 38.7* 37.8* 36.6*  PLT 226 199 171   BMET Recent Labs    03/23/20 1852 03/23/20 2206 03/24/20 0312  NA 122* 125* 123*  K 5.0 5.4* 4.6  CL 87* 90* 91*  CO2 21* 20* 20*  GLUCOSE 105* 96 128*  BUN 45* 42* 41*  CREATININE 1.66* 1.50* 1.43*  CALCIUM 7.6* 7.5* 7.3*   LFT Recent Labs    03/23/20 1852 03/23/20 1852 03/24/20 0312  PROT 6.2*   < > 5.8*  ALBUMIN 2.3*   < > 2.2*  AST 209*   < > 196*  ALT 221*   < > 209*  ALKPHOS 614*   < > 560*  BILITOT 5.4*   < > 5.5*  BILIDIR 3.4*  --   --   IBILI 2.0*  --   --    < > = values in this interval not displayed.   PT/INR Recent Labs    03/23/20 1340 03/24/20 0823  LABPROT 19.0* 18.9*  INR 1.7* 1.6*   Hepatitis Panel No results for input(s): HEPBSAG, HCVAB, HEPAIGM, HEPBIGM in the last 72 hours.    Studies/Results: CT ABDOMEN PELVIS W CONTRAST  Result Date: 03/23/2020 CLINICAL DATA:  Abdominal distension, abdominal pain with elevated LFTs with known metastatic disease to the liver. EXAM: CT ABDOMEN AND PELVIS WITH CONTRAST TECHNIQUE: Multidetector CT imaging of the abdomen and pelvis was performed using the standard protocol following bolus administration of intravenous contrast. CONTRAST:  140m OMNIPAQUE IOHEXOL 300 MG/ML  SOLN COMPARISON:  03/01/2020 FINDINGS: Lower chest: Interval development of RIGHT-sided effusion and basilar airspace disease since the prior study. Calcified coronary artery disease. No pericardial effusion. Heart is incompletely imaged. Minimal atelectasis at the LEFT lung base. Hepatobiliary: Multifocal hepatic metastatic lesions now more confluent and enlarging when compared to the prior study. (Image 26, series 2) 3.5 cm lesion previously 2.9 cm. Near confluent disease in the LEFT hepatic lobe on today's exam. Area of confluent disease in the RIGHT hepatic lobe (image 39, series 2) 5.0 cm at most 3.7 cm on the prior study and without discrete intervening normal hepatic parenchyma between  lesions in this area. Portal vein is patent. Gallbladder is collapsed. Question of lack of enhancement of the medial wall of the gallbladder. In the current context the significance is uncertain the gallbladder could be collapse upon itself. Signs of ascites and small pericholecystic fluid. Portal vein is patent. Pancreas: Pancreas is normal without ductal dilation or inflammation. Spleen: Spleen normal size and contour. Adrenals/Urinary Tract: Adrenal glands are  normal. Symmetric renal enhancement. No hydronephrosis. LEFT renal cyst arises from lateral LEFT kidney as before. Urinary bladder is normal to the extent evaluated aside from LEFT posterolateral bladder diverticulum. Stomach/Bowel: Stomach is under distended. Small bowel without overt signs of obstruction. Mildly dilated distal small bowel loops filled with liquid stool like material. Stool and gas filling the proximal colon. Colonic diverticulosis. No diverticulitis. The appendix is normal. Vascular/Lymphatic: Calcific and noncalcific atheromatous plaque of the abdominal aorta. Mild dilation without aneurysm. 2.9 cm greatest axial dimension. Retroperitoneal adenopathy. (Image 46, series 2) 1.3 cm previously 1 cm short axis. Periportal nodal enlargement is similar. Juxta crural lymph node and hepatic gastric lymph nodes are similar. Reproductive: Prostate is unremarkable by CT. Other: Post LEFT inguinal herniorrhaphy. Ascites which is new from the prior study. No signs of free air. Musculoskeletal: Spinal degenerative changes. No acute or destructive bone process. IMPRESSION: 1. Given new ascites and lack of gallbladder wall enhancement with collapse of the gallbladder since previous imaging studies would suggest focused assessment with gallbladder ultrasound to ensure gallbladder wall integrity. HIDA scan is likely to be un informative at this time due to hepatic dysfunction. 2. Multifocal hepatic metastatic lesions now more confluent and enlarging when  compared to the prior study. 3. New small volume ascites is more likely related to worsening hepatic dysfunction in the setting of extensive hepatic metastatic disease. 4. Interval development of RIGHT-sided effusion and basilar airspace disease since the prior study. 5. Colonic diverticulosis without diverticulitis. 6. Aortic atherosclerosis. Aortic Atherosclerosis (ICD10-I70.0). Electronically Signed   By: Zetta Bills M.D.   On: 03/23/2020 20:17   US Abdomen Limited RUQ  Result Date: 03/23/2020 CLINICAL DATA:  Hepatic metastatic disease, abnormal gallbladder on CT EXAM: ULTRASOUND ABDOMEN LIMITED RIGHT UPPER QUADRANT COMPARISON:  03/23/2020 FINDINGS: Gallbladder: Not well visualized. The gallbladder was completely decompressed on preceding CT. Common bile duct: Diameter: 5 mm Liver: Numerous hypoechoic masses are seen throughout the liver parenchyma consistent with known metastatic disease. No intrahepatic duct dilation. Portal vein is patent on color Doppler imaging with normal direction of blood flow towards the liver. Other: None. IMPRESSION: 1. Diffuse liver metastases. 2. Nonvisualization of the gallbladder due to decompressed state. Electronically Signed   By: Randa Ngo M.D.   On: 03/23/2020 22:19    IMPRESSION/PLAN:  35. 69 year old male with liver metastasis, primary malignancy unclear. S/P liver biopsy by IR today.  Alk phos 560. AST 196. ALT 209. T. Bili 5.5.  CTAP  5/26 innumerable liver metastatic lesions and thickening to the distal esophagus. CTAP 6/17 showed increasing size of metastatic liver lesions and a small volume of ascites. -Await liver biopsy results -Consider eventual EGD to evaluate the distal esophagus -Consider MRCP to evaluate the biliary tract with elevated alk phos, total bili and LFT levels -Hepatic panel, BMP, CBC, INR in am -IV fluids per the hospitalist   2. AKI. Cr 1.43.  3. Leukocytosis. WBC 20.1. He is afebrile. Patient is on Prednisone for COPD  exacerbation   4.  History of GERD -Continue Pantoprazole 58m daily   5.  History of coronary artery disease status post stent on Plavix and ASA  6. History of CVA    Further recommendations per Dr. MCorena PilgrimMDorathy Daft 03/24/2020, 2:24 PM

## 2020-03-25 LAB — CBC
HCT: 39 % (ref 39.0–52.0)
Hemoglobin: 13.2 g/dL (ref 13.0–17.0)
MCH: 26.5 pg (ref 26.0–34.0)
MCHC: 33.8 g/dL (ref 30.0–36.0)
MCV: 78.2 fL — ABNORMAL LOW (ref 80.0–100.0)
Platelets: 179 10*3/uL (ref 150–400)
RBC: 4.99 MIL/uL (ref 4.22–5.81)
RDW: 15.9 % — ABNORMAL HIGH (ref 11.5–15.5)
WBC: 19.2 10*3/uL — ABNORMAL HIGH (ref 4.0–10.5)
nRBC: 0 % (ref 0.0–0.2)

## 2020-03-25 LAB — COMPREHENSIVE METABOLIC PANEL
ALT: 225 U/L — ABNORMAL HIGH (ref 0–44)
AST: 185 U/L — ABNORMAL HIGH (ref 15–41)
Albumin: 2.2 g/dL — ABNORMAL LOW (ref 3.5–5.0)
Alkaline Phosphatase: 649 U/L — ABNORMAL HIGH (ref 38–126)
Anion gap: 14 (ref 5–15)
BUN: 43 mg/dL — ABNORMAL HIGH (ref 8–23)
CO2: 19 mmol/L — ABNORMAL LOW (ref 22–32)
Calcium: 7.3 mg/dL — ABNORMAL LOW (ref 8.9–10.3)
Chloride: 90 mmol/L — ABNORMAL LOW (ref 98–111)
Creatinine, Ser: 1.43 mg/dL — ABNORMAL HIGH (ref 0.61–1.24)
GFR calc Af Amer: 58 mL/min — ABNORMAL LOW (ref >60)
GFR calc non Af Amer: 50 mL/min — ABNORMAL LOW (ref >60)
Glucose, Bld: 89 mg/dL (ref 70–99)
Potassium: 5 mmol/L (ref 3.5–5.1)
Sodium: 123 mmol/L — ABNORMAL LOW (ref 135–145)
Total Bilirubin: 6.6 mg/dL — ABNORMAL HIGH (ref 0.3–1.2)
Total Protein: 6.1 g/dL — ABNORMAL LOW (ref 6.5–8.1)

## 2020-03-25 LAB — CEA: CEA: 118 ng/mL — ABNORMAL HIGH (ref 0.0–4.7)

## 2020-03-25 LAB — ANTI-SMOOTH MUSCLE ANTIBODY, IGG: F-Actin IgG: 16 Units (ref 0–19)

## 2020-03-25 LAB — PROTIME-INR
INR: 1.6 — ABNORMAL HIGH (ref 0.8–1.2)
Prothrombin Time: 18.8 seconds — ABNORMAL HIGH (ref 11.4–15.2)

## 2020-03-25 LAB — AFP TUMOR MARKER: AFP, Serum, Tumor Marker: 1.5 ng/mL (ref 0.0–8.3)

## 2020-03-25 LAB — MAGNESIUM: Magnesium: 3.1 mg/dL — ABNORMAL HIGH (ref 1.7–2.4)

## 2020-03-25 LAB — CANCER ANTIGEN 19-9: CA 19-9: 128346 U/mL — ABNORMAL HIGH (ref 0–35)

## 2020-03-25 LAB — HEPATITIS B SURFACE ANTIBODY, QUANTITATIVE: Hep B S AB Quant (Post): <3.1 m[IU]/mL — ABNORMAL LOW (ref >9.9)

## 2020-03-25 MED ORDER — BISACODYL 5 MG PO TBEC: 5.0000 mg | DELAYED_RELEASE_TABLET | Freq: Every day | ORAL | Status: DC | PRN

## 2020-03-25 MED ORDER — POLYETHYLENE GLYCOL 3350 17 G PO PACK
17.0000 g | PACK | Freq: Every day | ORAL | Status: DC
  Administered 2020-03-25 – 2020-03-29 (×4): 17 g via ORAL
  Filled 2020-03-25 (×5): qty 1

## 2020-03-25 MED ORDER — BOOST / RESOURCE BREEZE PO LIQD CUSTOM
1.0000 | Freq: Three times a day (TID) | ORAL | Status: DC
  Administered 2020-03-25 – 2020-03-29 (×8): 1 via ORAL

## 2020-03-25 NOTE — Plan of Care (Signed)
  Problem: Clinical Measurements: Goal: Respiratory complications will improve Outcome: Progressing   Problem: Clinical Measurements: Goal: Cardiovascular complication will be avoided Outcome: Progressing   Problem: Coping: Goal: Level of anxiety will decrease Outcome: Progressing   Problem: Elimination: Goal: Will not experience complications related to bowel motility Outcome: Progressing   Problem: Pain Managment: Goal: General experience of comfort will improve Outcome: Progressing   Problem: Skin Integrity: Goal: Risk for impaired skin integrity will decrease Outcome: Progressing   Problem: Safety: Goal: Ability to remain free from injury will improve Outcome: Progressing

## 2020-03-25 NOTE — Progress Notes (Signed)
PROGRESS NOTE    Charles Mccoy   GHW:[id number]  DOB: 07/14/1951  PCP: [name], [name], MD    DOA: 03/23/2020 LOS: 2   Brief Narrative   69 year old with history of COPD, CVA, CAD status post stent, HLD, HTN, recent finding of liver lesions sent to the hospital by oncology for transaminitis and hyperbilirubinemia.  Upon admission had lactic acidosis, hyponatremia, 1.66, BUN 45, transaminitis, elevated total bilirubin.  CT abdomen pelvis showed ascites, multifocal liver lesions.   Assessment & Plan   Principal Problem:   Hyperbilirubinemia Active Problems:   Essential hypertension   COPD (chronic obstructive pulmonary disease) (HCC)   CAD (coronary artery disease)   History of CVA (cerebrovascular accident)   Hyponatremia   Transaminitis   Liver lesion   Hyperbilirubinemia/transaminitis/ascitesin the setting of worsening suspected metastatic liver mass Biopsied on 6/18.  Pathology pending. CT abdomen pelvis shows ascites. RUQ ultrasound showed diffuse liver metastases. Continue Aldactone Ammonia levels- 35 Patient of Dr. Burr Medico, oncology-notified   Hyponatremia with AKI Initial concerns for hepatorenal syndrome.  CT of the abdomen shows stable left renal cyst. Follow BMP daily. Urine electrolytes: Na 31, K 31 Urine and serum osmolalities ordered. Suspect hypervolemic hyponatremia in setting of liver disease as pt with distended abdomen and peripheral edema.   Mild COPD exacerbation with Cough Recently outpatient prescribed prednisone taper and Z-Pak.  Will complete the course here.  Incentive spirometer flutter valve  Leukocytosis -No evidence of infection. Currently is on prednisone. Continue to monitor.  Hypertension Continue Norvasc  History of CAD s/p stent Continue aspirin Plavix statin Aldactone. Currently will hold Plavix in anticipation for liver biopsy Cardiac cath July 2020-severe LAD stenosis status post DES.  Initially on aspirin and  Brilinta then changed to Plavix  History of CVA, 2008 Continue aspirin. Hold Plavix until after biopsy  Patient BMI: Body mass index is 29.23 kg/m.   DVT prophylaxis: SCDs Start: 03/23/20 2126   Diet:  Diet Orders (From admission, onward)    Start     Ordered   03/24/20 1352  Diet regular Room service appropriate? Yes; Fluid consistency: Thin  Diet effective now       Comments: NPO x 1 hr upon arrival to short stay unit or hospital room, than advance as tolerated to pre procedural diet.  Question Answer Comment  Room service appropriate? Yes   Fluid consistency: Thin      03/24/20 1351            Code Status: Full Code    Subjective 03/25/20    Patient reports he had a miserable night, could not get comfortable due to RUQ pain.  Feeling somewhat better and resting comfortably this morning.  Denies nausea, vomiting, diarrhea, fevers chills or other acute complaints.   Disposition Plan & Communication   Status is: Inpatient  Remains inpatient appropriate because:Inpatient level of care appropriate due to severity of illness   Dispo: The patient is from: Home              Anticipated d/c is to: Home              Anticipated d/c date is: 2-3 days              Patient currently is not medically stable to d/c.   Family Communication: none at bedside, will attempt to call.    Consults, Procedures, Significant Events   Consultants:   Oncology  Gastroenterology  Procedures:   Ultrasound-guided liver biopsy on 03/24/20  Objective   Vitals:   03/24/20 1358 03/24/20 1444 03/24/20 2051 03/25/20 0525  BP: 116/76 122/77 118/74 127/76  Pulse: 74 73 75 92  Resp: 16 18 16 18   Temp: 97.6 F (36.4 C) 97.6 F (36.4 C) 98.4 F (36.9 C) 98.4 F (36.9 C)  TempSrc:      SpO2: 92% 93% 97% 93%  Weight:      Height:        Intake/Output Summary (Last 24 hours) at 03/25/2020 0715 Last data filed at 03/25/2020 0600 Gross per 24 hour  Intake 702.46 ml  Output 1875  ml  Net -1172.54 ml   Filed Weights   03/23/20 1611 03/24/20 0325  Weight: 107.5 kg 107.5 kg    Physical Exam:  General exam: awake, alert, no acute distress HEENT: moist mucus membranes, hearing grossly normal  Respiratory system: CTAB, no wheezes, rales or rhonchi, normal respiratory effort. Cardiovascular system: normal S1/S2, RRR, pitting edema of bilateral lower extremities 1-2+.   Gastrointestinal system: distended with RUQ tenderness, no guarding or rebound, bowel sounds present. Central nervous system: A&O x4. no gross focal neurologic deficits, normal speech Psychiatry: normal mood, congruent affect, judgement and insight appear normal  Labs   Data Reviewed: I have personally reviewed following labs and imaging studies  CBC: Recent Labs  Lab 03/21/20 0852 03/23/20 1339 03/23/20 1852 03/24/20 0312 03/25/20 0329  WBC 18.1* 20.7* 16.9* 16.9* 19.2*  NEUTROABS 15.4* 17.8* 14.9*  --   --   HGB 12.9* 13.2 12.7* 12.3* 13.2  HCT 38.1* 38.7* 37.8* 36.6* 39.0  MCV 75.7* 76.0* 77.9* 77.4* 78.2*  PLT 200 226 199 171 875   Basic Metabolic Panel: Recent Labs  Lab 03/23/20 1339 03/23/20 1852 03/23/20 2206 03/24/20 0312 03/25/20 0329  NA 123* 122* 125* 123* 123*  K 4.8 5.0 5.4* 4.6 5.0  CL 89* 87* 90* 91* 90*  CO2 21* 21* 20* 20* 19*  GLUCOSE 101* 105* 96 128* 89  BUN 39* 45* 42* 41* 43*  CREATININE 1.54* 1.66* 1.50* 1.43* 1.43*  CALCIUM 7.9* 7.6* 7.5* 7.3* 7.3*  MG  --   --   --   --  3.1*   GFR: Estimated Creatinine Clearance: 66.9 mL/min (A) (by C-G formula based on SCr of 1.43 mg/dL (H)). Liver Function Tests: Recent Labs  Lab 03/23/20 1339 03/23/20 1852 03/24/20 0312 03/25/20 0329  AST 231* 209* 196* 185*  ALT 252* 221* 209* 225*  ALKPHOS 742* 614* 560* 649*  BILITOT 5.5* 5.4* 5.5* 6.6*  PROT 6.3* 6.2* 5.8* 6.1*  ALBUMIN 2.2* 2.3* 2.2* 2.2*   Recent Labs  Lab 03/23/20 1852  LIPASE 35   Recent Labs  Lab 03/23/20 2207  AMMONIA 35    Coagulation Profile: Recent Labs  Lab 03/20/20 1115 03/21/20 0750 03/23/20 1340 03/24/20 0823 03/25/20 0329  INR 1.7* 1.4* 1.7* 1.6* 1.6*   Cardiac Enzymes: No results for input(s): CKTOTAL, CKMB, CKMBINDEX, TROPONINI in the last 168 hours. BNP (last 3 results) No results for input(s): PROBNP in the last 8760 hours. HbA1C: No results for input(s): HGBA1C in the last 72 hours. CBG: No results for input(s): GLUCAP in the last 168 hours. Lipid Profile: No results for input(s): CHOL, HDL, LDLCALC, TRIG, CHOLHDL, LDLDIRECT in the last 72 hours. Thyroid Function Tests: Recent Labs    03/24/20 0823  TSH 2.324   Anemia Panel: Recent Labs    03/23/20 1340  FERRITIN 11,196*  TIBC 168*  IRON 23*   Sepsis Labs: Recent Labs  Lab 03/23/20 1853 03/23/20 2300  LATICACIDVEN 3.6* 3.6*    Recent Results (from the past 240 hour(s))  Blood culture (routine x 2)     Status: None (Preliminary result)   Collection Time: 03/23/20  6:53 PM   Specimen: BLOOD  Result Value Ref Range Status   Specimen Description   Final    BLOOD LEFT ANTECUBITAL Performed at [facility] [address], [place], [place] [address]    Special Requests   Final    BOTTLES DRAWN AEROBIC AND ANAEROBIC Blood Culture adequate volume Performed at [facility] [address] [place], [place] [address]    Culture   Final    NO GROWTH < 12 HOURS Performed at [facility] [address], [place], [place] [address]    Report Status PENDING  Incomplete  Blood culture (routine x 2)     Status: None (Preliminary result)   Collection Time: 03/23/20  6:53 PM   Specimen: BLOOD RIGHT FOREARM  Result Value Ref Range Status   Specimen Description   Final    BLOOD RIGHT FOREARM Performed at [facility] [address] [place], [place] [address]    Special Requests   Final    BOTTLES DRAWN AEROBIC AND ANAEROBIC Blood Culture adequate  volume Performed at [facility] [address] [place], [place] [address]    Culture   Final    NO GROWTH < 12 HOURS Performed at [facility] [address] [place], [place] [address]    Report Status PENDING  Incomplete  SARS Coronavirus 2 by RT PCR (hospital order, performed in [facility] hospital lab) Nasopharyngeal Nasopharyngeal Swab     Status: None   Collection Time: 03/24/20 10:42 AM   Specimen: Nasopharyngeal Swab  Result Value Ref Range Status   SARS Coronavirus 2 NEGATIVE NEGATIVE Final    Comment: (NOTE) SARS-CoV-2 target nucleic acids are NOT DETECTED.  The SARS-CoV-2 RNA is generally detectable in upper and lower respiratory specimens during the acute phase of infection. The lowest concentration of SARS-CoV-2 viral copies this assay can detect is 250 copies / mL. A negative result does not preclude SARS-CoV-2 infection and should not be used as the sole basis for treatment or other patient management decisions.  A negative result may occur with improper specimen collection / handling, submission of specimen other than nasopharyngeal swab, presence of viral mutation(s) within the areas targeted by this assay, and inadequate number of viral copies (<250 copies / mL). A negative result must be combined with clinical observations, patient history, and epidemiological information.  Fact Sheet for Patients:   StrictlyIdeas.no  Fact Sheet for Healthcare Providers: BankingDealers.co.za  This test is not yet approved or  cleared by the [place] FDA and has been authorized for detection and/or diagnosis of SARS-CoV-2 by FDA under an Emergency Use Authorization (EUA).  This EUA will remain in effect (meaning this test can be used) for the duration of the COVID-19 declaration under Section 564(b)(1) of the Act, 21 U.S.C. section 360bbb-3(b)(1), unless the authorization is terminated or revoked  sooner.  Performed at [facility], [place] [address] [place], [place] [address]       Imaging Studies   CT ABDOMEN PELVIS W CONTRAST  Result Date: 03/23/2020 CLINICAL DATA:  Abdominal distension, abdominal pain with elevated LFTs with known metastatic disease to the liver. EXAM: CT ABDOMEN AND PELVIS WITH CONTRAST TECHNIQUE: Multidetector CT imaging of  the abdomen and pelvis was performed using the standard protocol following bolus administration of intravenous contrast. CONTRAST:  171mL OMNIPAQUE IOHEXOL 300 MG/ML  SOLN COMPARISON:  03/01/2020 FINDINGS: Lower chest: Interval development of RIGHT-sided effusion and basilar airspace disease since the prior study. Calcified coronary artery disease. No pericardial effusion. Heart is incompletely imaged. Minimal atelectasis at the LEFT lung base. Hepatobiliary: Multifocal hepatic metastatic lesions now more confluent and enlarging when compared to the prior study. (Image 26, series 2) 3.5 cm lesion previously 2.9 cm. Near confluent disease in the LEFT hepatic lobe on today's exam. Area of confluent disease in the RIGHT hepatic lobe (image 39, series 2) 5.0 cm at most 3.7 cm on the prior study and without discrete intervening normal hepatic parenchyma between lesions in this area. Portal vein is patent. Gallbladder is collapsed. Question of lack of enhancement of the medial wall of the gallbladder. In the current context the significance is uncertain the gallbladder could be collapse upon itself. Signs of ascites and small pericholecystic fluid. Portal vein is patent. Pancreas: Pancreas is normal without ductal dilation or inflammation. Spleen: Spleen normal size and contour. Adrenals/Urinary Tract: Adrenal glands are normal. Symmetric renal enhancement. No hydronephrosis. LEFT renal cyst arises from lateral LEFT kidney as before. Urinary bladder is normal to the extent evaluated aside from LEFT posterolateral bladder diverticulum.  Stomach/Bowel: Stomach is under distended. Small bowel without overt signs of obstruction. Mildly dilated distal small bowel loops filled with liquid stool like material. Stool and gas filling the proximal colon. Colonic diverticulosis. No diverticulitis. The appendix is normal. Vascular/Lymphatic: Calcific and noncalcific atheromatous plaque of the abdominal aorta. Mild dilation without aneurysm. 2.9 cm greatest axial dimension. Retroperitoneal adenopathy. (Image 46, series 2) 1.3 cm previously 1 cm short axis. Periportal nodal enlargement is similar. Juxta crural lymph node and hepatic gastric lymph nodes are similar. Reproductive: Prostate is unremarkable by CT. Other: Post LEFT inguinal herniorrhaphy. Ascites which is new from the prior study. No signs of free air. Musculoskeletal: Spinal degenerative changes. No acute or destructive bone process. IMPRESSION: 1. Given new ascites and lack of gallbladder wall enhancement with collapse of the gallbladder since previous imaging studies would suggest focused assessment with gallbladder ultrasound to ensure gallbladder wall integrity. HIDA scan is likely to be un informative at this time due to hepatic dysfunction. 2. Multifocal hepatic metastatic lesions now more confluent and enlarging when compared to the prior study. 3. New small volume ascites is more likely related to worsening hepatic dysfunction in the setting of extensive hepatic metastatic disease. 4. Interval development of RIGHT-sided effusion and basilar airspace disease since the prior study. 5. Colonic diverticulosis without diverticulitis. 6. Aortic atherosclerosis. Aortic Atherosclerosis (ICD10-I70.0). Electronically Signed   By: Zetta Bills M.D.   On: 03/23/2020 20:17   US BIOPSY (LIVER)  Result Date: 03/24/2020 INDICATION: No known primary, now with multiple infiltrative liver lesions worrisome for metastatic disease. Please perform ultrasound-guided liver lesion biopsy for tissue diagnostic  purposes. EXAM: ULTRASOUND GUIDED LIVER LESION BIOPSY COMPARISON:  CT abdomen pelvis-03/23/2020; 03/01/2020 MEDICATIONS: None ANESTHESIA/SEDATION: Fentanyl 100 mcg IV; Versed 2 mg IV Total Moderate Sedation time:  10 Minutes. The patient's level of consciousness and vital signs were monitored continuously by radiology nursing throughout the procedure under my direct supervision. COMPLICATIONS: None immediate. PROCEDURE: Informed written consent was obtained from the patient after a discussion of the risks, benefits and alternatives to treatment. The patient understands and consents the procedure. A timeout was performed prior to the initiation of the procedure.  Ultrasound scanning was performed of the right upper abdominal quadrant demonstrates multiple peripherally hypoechoic nodules and masses scattered throughout the liver compatible with findings seen on preceding noncontrast abdominal CT. A dominant approximately 2.4 x 2.2 cm hypoechoic nodule within the subcapsular aspect the right lobe of the liver, likely correlating with the dominant lesion seen on preceding abdominal CT image 26, series 2, was targeted for biopsy given lesion location and sonographic window (image 11). The procedure was planned. The right upper abdominal quadrant was prepped and draped in the usual sterile fashion. The overlying soft tissues were anesthetized with 1% lidocaine with epinephrine. A 17 gauge, 6.8 cm co-axial needle was advanced into a peripheral aspect of the lesion. This was followed by 6 core biopsies with an 18 gauge core device under direct ultrasound guidance. The coaxial needle tract was embolized with a small amount of Gel-Foam slurry and superficial hemostasis was obtained with manual compression. Post procedural scanning was negative for definitive area of hemorrhage or additional complication. A dressing was placed. The patient tolerated the procedure well without immediate post procedural complication. IMPRESSION:  Technically successful ultrasound guided core needle biopsy of indeterminate lesion within the right lobe of the liver. Electronically Signed   By: Sandi Mariscal M.D.   On: 03/24/2020 15:27   US Abdomen Limited RUQ  Result Date: 03/23/2020 CLINICAL DATA:  Hepatic metastatic disease, abnormal gallbladder on CT EXAM: ULTRASOUND ABDOMEN LIMITED RIGHT UPPER QUADRANT COMPARISON:  03/23/2020 FINDINGS: Gallbladder: Not well visualized. The gallbladder was completely decompressed on preceding CT. Common bile duct: Diameter: 5 mm Liver: Numerous hypoechoic masses are seen throughout the liver parenchyma consistent with known metastatic disease. No intrahepatic duct dilation. Portal vein is patent on color Doppler imaging with normal direction of blood flow towards the liver. Other: None. IMPRESSION: 1. Diffuse liver metastases. 2. Nonvisualization of the gallbladder due to decompressed state. Electronically Signed   By: Randa Ngo M.D.   On: 03/23/2020 22:19     Medications   Scheduled Meds: . amLODipine  10 mg Oral Daily  . aspirin EC  81 mg Oral Daily  . azithromycin  250 mg Oral Daily  . fluticasone furoate-vilanterol  1 puff Inhalation Daily  . montelukast  10 mg Oral Daily  . pantoprazole  40 mg Oral Daily  . predniSONE  20 mg Oral Q breakfast  . spironolactone  25 mg Oral Daily   Continuous Infusions: . sodium chloride 10 mL/hr at 03/25/20 0600  . phytonadione (VITAMIN K) IV 10 mg (03/24/20 1743)       LOS: 2 days    Time spent: 25 minutes    Ezekiel Slocumb, DO [facility]  03/25/2020, 7:15 AM    If 7PM-7AM, please contact night-coverage. How to contact the [facility] Attending or Consulting provider [place] or covering provider during after hours [place], for this patient?    1. Check the care team in [facility] and look for a) attending/consulting [facility] provider listed and b) the [facility] team listed 2. Log into [email or web address] and use [place]'s universal password to access. If you  do not have the password, please contact the hospital operator. 3. Locate the [facility] provider you are looking for under [facility] and page to a number that you can be directly reached. 4. If you still have difficulty reaching the provider, please page the [facility] (Director on Call) for the Hospitalists listed on amion for assistance.

## 2020-03-26 ENCOUNTER — Inpatient Hospital Stay (HOSPITAL_COMMUNITY): Payer: Medicare Other

## 2020-03-26 LAB — COMPREHENSIVE METABOLIC PANEL
ALT: 230 U/L — ABNORMAL HIGH (ref 0–44)
AST: 191 U/L — ABNORMAL HIGH (ref 15–41)
Albumin: 2 g/dL — ABNORMAL LOW (ref 3.5–5.0)
Alkaline Phosphatase: 644 U/L — ABNORMAL HIGH (ref 38–126)
Anion gap: 11 (ref 5–15)
BUN: 46 mg/dL — ABNORMAL HIGH (ref 8–23)
CO2: 20 mmol/L — ABNORMAL LOW (ref 22–32)
Calcium: 7.4 mg/dL — ABNORMAL LOW (ref 8.9–10.3)
Chloride: 91 mmol/L — ABNORMAL LOW (ref 98–111)
Creatinine, Ser: 1.31 mg/dL — ABNORMAL HIGH (ref 0.61–1.24)
GFR calc Af Amer: >60 mL/min (ref >60)
GFR calc non Af Amer: 56 mL/min — ABNORMAL LOW (ref >60)
Glucose, Bld: 128 mg/dL — ABNORMAL HIGH (ref 70–99)
Potassium: 4.6 mmol/L (ref 3.5–5.1)
Sodium: 122 mmol/L — ABNORMAL LOW (ref 135–145)
Total Bilirubin: 8.4 mg/dL — ABNORMAL HIGH (ref 0.3–1.2)
Total Protein: 5.7 g/dL — ABNORMAL LOW (ref 6.5–8.1)

## 2020-03-26 LAB — CBC
HCT: 37.8 % — ABNORMAL LOW (ref 39.0–52.0)
Hemoglobin: 12.9 g/dL — ABNORMAL LOW (ref 13.0–17.0)
MCH: 26.3 pg (ref 26.0–34.0)
MCHC: 34.1 g/dL (ref 30.0–36.0)
MCV: 77.1 fL — ABNORMAL LOW (ref 80.0–100.0)
Platelets: 152 10*3/uL (ref 150–400)
RBC: 4.9 MIL/uL (ref 4.22–5.81)
RDW: 15.9 % — ABNORMAL HIGH (ref 11.5–15.5)
WBC: 19.6 10*3/uL — ABNORMAL HIGH (ref 4.0–10.5)
nRBC: 0 % (ref 0.0–0.2)

## 2020-03-26 LAB — MAGNESIUM: Magnesium: 3.3 mg/dL — ABNORMAL HIGH (ref 1.7–2.4)

## 2020-03-26 LAB — OSMOLALITY: Osmolality: 283 mOsm/kg (ref 275–295)

## 2020-03-26 LAB — ANA W/REFLEX IF POSITIVE: Anti Nuclear Antibody (ANA): NEGATIVE

## 2020-03-26 MED ORDER — FUROSEMIDE 10 MG/ML IJ SOLN
40.0000 mg | Freq: Once | INTRAMUSCULAR | Status: AC
  Administered 2020-03-26: 40 mg via INTRAVENOUS
  Filled 2020-03-26: qty 4

## 2020-03-26 MED ORDER — TRAMADOL HCL 50 MG PO TABS
50.0000 mg | ORAL_TABLET | Freq: Two times a day (BID) | ORAL | Status: DC
  Administered 2020-03-26 – 2020-03-27 (×3): 50 mg via ORAL
  Filled 2020-03-26 (×3): qty 1

## 2020-03-26 MED ORDER — CLOPIDOGREL BISULFATE 75 MG PO TABS
75.0000 mg | ORAL_TABLET | Freq: Every day | ORAL | Status: DC
  Administered 2020-03-26 – 2020-03-28 (×3): 75 mg via ORAL
  Filled 2020-03-26 (×3): qty 1

## 2020-03-26 MED ORDER — DIPHENHYDRAMINE HCL 25 MG PO CAPS
25.0000 mg | ORAL_CAPSULE | Freq: Four times a day (QID) | ORAL | Status: DC | PRN
  Administered 2020-03-26 – 2020-03-29 (×3): 25 mg via ORAL
  Filled 2020-03-26 (×3): qty 1

## 2020-03-26 NOTE — Progress Notes (Signed)
Md notified of pt having hives after blue pt gown placed. Visible hives on back and arms were gown was. Rn took gown off and pt put on his own shirt. Pt with no trouble breathing and Rn will monitor itching and hives.

## 2020-03-26 NOTE — Plan of Care (Signed)

## 2020-03-26 NOTE — Plan of Care (Signed)
  Problem: Clinical Measurements: Goal: Respiratory complications will improve Outcome: Progressing   Problem: Clinical Measurements: Goal: Cardiovascular complication will be avoided Outcome: Progressing   Problem: Pain Managment: Goal: General experience of comfort will improve Outcome: Progressing   Problem: Safety: Goal: Ability to remain free from injury will improve Outcome: Progressing   Problem: Skin Integrity: Goal: Risk for impaired skin integrity will decrease Outcome: Progressing   

## 2020-03-26 NOTE — Progress Notes (Signed)
Pt up to to chair with two person assist. Pt very weak with limited leg mobility. No needs at this time. Family at bedside.

## 2020-03-26 NOTE — Progress Notes (Addendum)
PROGRESS NOTE    Charles Mccoy   YFV:[id number]  DOB: July 25, 1951  PCP: [name], [name], MD    DOA: 03/23/2020 LOS: 3   Brief Narrative   69 year old with history of COPD, CVA, CAD status post stent, HLD, HTN, recent finding of liver lesions sent to the hospital by oncology for transaminitis and hyperbilirubinemia.  Upon admission had lactic acidosis, hyponatremia, 1.66, BUN 45, transaminitis, elevated total bilirubin.  CT abdomen pelvis showed ascites, multifocal liver lesions.   Assessment & Plan   Principal Problem:   Hyperbilirubinemia Active Problems:   Essential hypertension   COPD (chronic obstructive pulmonary disease) (HCC)   CAD (coronary artery disease)   History of CVA (cerebrovascular accident)   Hyponatremia   Transaminitis   Liver lesion   Hyperbilirubinemia/transaminitis/ascitesin the setting of progressive metastatic liver disease Liver biopsied on 6/18.  Pathology pending. CT abdomen pelvis showed small volume ascites. RUQ ultrasound showed diffuse liver metastases. Continue Aldactone. Ammonia levels- 35 Patient of Dr. Burr Medico, oncology-notified Palliative care consulted Will give 40mg  IV Lasix today as abdomen feeling more distended and lower extremity edema.  Abdominal pain -secondary to above.  Continue as needed pain medication and bowel regimen.  Patient has been very hesitant to use pain medications per nursing.  Discussed with patient, he is agreeable to scheduled medication for baseline pain control.   Will try Tramadol 50 mg q12h for now.  Palliative to follow, appreciate recommendations.  AKI - present on admission.  Improving. Initial concerns for hepatorenal syndrome.  CT of the abdomen shows stable left renal cyst.   Follow BMP daily.  Hyponatremia - present on admission.  Suspect hypervolemic hyponatremia in setting of liver disease as pt with distended abdomen and peripheral edema vs SIADH in setting of malignancy.  Urine  electrolytes on admission: Na 31, K 31 (pt takes Aldactone). Giving 40 mg IV Lasix today as above.  Recheck BMP in AM.  If no improvement, would place on fluid restriction and monitor. Serum and urine osmolalities are still pending, reordered.  Consider nephrology consult.  Mild COPD exacerbation with Cough Recently outpatient prescribed prednisone taper and Z-Pak.  Will complete the course here.  Incentive spirometer flutter valve  Leukocytosis - persistent -No evidence of infection. Currently is on prednisone, likely steroid induced and reactive. Continue to monitor for signs/symptoms of infection. Chest xray 6/20 shows no infiltrates.  Generalized weakness -due to above.  PT evaluation pending.  Hypertension - Continue Norvasc  History of CAD s/p stent Continue aspirin Plavix statin Aldactone. Currently will hold Plavix in anticipation for liver biopsy Cardiac cath July 2020-severe LAD stenosis status post DES.  Initially on aspirin and Brilinta then changed to Plavix  History of CVA, 2008 Continue aspirin. Resume Plavix (held for biopsy).   Patient BMI: Body mass index is 29.23 kg/m.   DVT prophylaxis: SCDs Start: 03/23/20 2126   Diet:  Diet Orders (From admission, onward)    Start     Ordered   03/24/20 1352  Diet regular Room service appropriate? Yes; Fluid consistency: Thin  Diet effective now       Comments: NPO x 1 hr upon arrival to short stay unit or hospital room, than advance as tolerated to pre procedural diet.  Question Answer Comment  Room service appropriate? Yes   Fluid consistency: Thin      03/24/20 1351            Code Status: Full Code    Subjective 03/26/20    Patient  seen at bedside with wife present this morning.  Has not had BM yet.  Denies nausea vomiting.  Does continue report right upper quadrant pain which is worse when he is in a reclined position.  Appears uncomfortable.  Denies fevers or chills.   Disposition Plan &  Communication   Status is: Inpatient  Remains inpatient appropriate because:Inpatient level of care appropriate due to severity of illness   Dispo: The patient is from: Home              Anticipated d/c is to: Home              Anticipated d/c date is: 2-3 days              Patient currently is not medically stable to d/c.   Family Communication: none at bedside, will attempt to call.    Consults, Procedures, Significant Events   Consultants:   Oncology  Gastroenterology  Palliative Care  Procedures:   Ultrasound-guided liver biopsy on 03/24/20   Objective   Vitals:   03/25/20 0525 03/25/20 1313 03/25/20 2108 03/26/20 0454  BP: 127/76 120/80 116/74 108/72  Pulse: 92 83 74 84  Resp: 18 14 16 18   Temp: 98.4 F (36.9 C)  97.8 F (36.6 C) 98.1 F (36.7 C)  TempSrc:      SpO2: 93% 95% 96% 93%  Weight:      Height:        Intake/Output Summary (Last 24 hours) at 03/26/2020 0801 Last data filed at 03/26/2020 0718 Gross per 24 hour  Intake 1971.72 ml  Output 1225 ml  Net 746.72 ml   Filed Weights   03/23/20 1611 03/24/20 0325  Weight: 107.5 kg 107.5 kg    Physical Exam:  General exam: awake, alert, no acute distress HEENT: moist mucus membranes, hearing grossly normal  Respiratory system: CTAB, shallow inspirations, no wheezes, rales or rhonchi, normal respiratory effort. Cardiovascular system: normal S1/S2, RRR, 1+ pitting edema of bilateral lower extremities.   Gastrointestinal system: distended with RUQ tenderness, no guarding or rebound, bowel sounds present. Central nervous system: A&O x4. no gross focal neurologic deficits, normal speech Psychiatry: normal mood, congruent affect, judgement and insight appear normal  Labs   Data Reviewed: I have personally reviewed following labs and imaging studies  CBC: Recent Labs  Lab 03/21/20 0852 03/21/20 0852 03/23/20 1339 03/23/20 1852 03/24/20 0312 03/25/20 0329 03/26/20 0308  WBC 18.1*   < > 20.7*  16.9* 16.9* 19.2* 19.6*  NEUTROABS 15.4*  --  17.8* 14.9*  --   --   --   HGB 12.9*   < > 13.2 12.7* 12.3* 13.2 12.9*  HCT 38.1*   < > 38.7* 37.8* 36.6* 39.0 37.8*  MCV 75.7*   < > 76.0* 77.9* 77.4* 78.2* 77.1*  PLT 200   < > 226 199 171 179 152   < > = values in this interval not displayed.   Basic Metabolic Panel: Recent Labs  Lab 03/23/20 1852 03/23/20 2206 03/24/20 0312 03/25/20 0329 03/26/20 0308  NA 122* 125* 123* 123* 122*  K 5.0 5.4* 4.6 5.0 4.6  CL 87* 90* 91* 90* 91*  CO2 21* 20* 20* 19* 20*  GLUCOSE 105* 96 128* 89 128*  BUN 45* 42* 41* 43* 46*  CREATININE 1.66* 1.50* 1.43* 1.43* 1.31*  CALCIUM 7.6* 7.5* 7.3* 7.3* 7.4*  MG  --   --   --  3.1* 3.3*   GFR: Estimated Creatinine Clearance: 73.1 mL/min (A) (  by C-G formula based on SCr of 1.31 mg/dL (H)). Liver Function Tests: Recent Labs  Lab 03/23/20 1339 03/23/20 1852 03/24/20 0312 03/25/20 0329 03/26/20 0308  AST 231* 209* 196* 185* 191*  ALT 252* 221* 209* 225* 230*  ALKPHOS 742* 614* 560* 649* 644*  BILITOT 5.5* 5.4* 5.5* 6.6* 8.4*  PROT 6.3* 6.2* 5.8* 6.1* 5.7*  ALBUMIN 2.2* 2.3* 2.2* 2.2* 2.0*   Recent Labs  Lab 03/23/20 1852  LIPASE 35   Recent Labs  Lab 03/23/20 2207  AMMONIA 35   Coagulation Profile: Recent Labs  Lab 03/20/20 1115 03/21/20 0750 03/23/20 1340 03/24/20 0823 03/25/20 0329  INR 1.7* 1.4* 1.7* 1.6* 1.6*   Cardiac Enzymes: No results for input(s): CKTOTAL, CKMB, CKMBINDEX, TROPONINI in the last 168 hours. BNP (last 3 results) No results for input(s): PROBNP in the last 8760 hours. HbA1C: No results for input(s): HGBA1C in the last 72 hours. CBG: No results for input(s): GLUCAP in the last 168 hours. Lipid Profile: No results for input(s): CHOL, HDL, LDLCALC, TRIG, CHOLHDL, LDLDIRECT in the last 72 hours. Thyroid Function Tests: Recent Labs    03/24/20 0823  TSH 2.324   Anemia Panel: Recent Labs    03/23/20 1340  FERRITIN 11,196*  TIBC 168*  IRON 23*    Sepsis Labs: Recent Labs  Lab 03/23/20 1853 03/23/20 2300  LATICACIDVEN 3.6* 3.6*    Recent Results (from the past 240 hour(s))  Blood culture (routine x 2)     Status: None (Preliminary result)   Collection Time: 03/23/20  6:53 PM   Specimen: BLOOD  Result Value Ref Range Status   Specimen Description   Final    BLOOD LEFT ANTECUBITAL Performed at [facility], [place] [address], [place], [place] [address]    Special Requests   Final    BOTTLES DRAWN AEROBIC AND ANAEROBIC Blood Culture adequate volume Performed at [place] [address], [place], [place] [address]    Culture   Final    NO GROWTH 3 DAYS Performed at [facility], [place] [address], [place], [place] [address]    Report Status PENDING  Incomplete  Blood culture (routine x 2)     Status: None (Preliminary result)   Collection Time: 03/23/20  6:53 PM   Specimen: BLOOD RIGHT FOREARM  Result Value Ref Range Status   Specimen Description   Final    BLOOD RIGHT FOREARM Performed at [place] [address], [place],  [address]    Special Requests   Final    BOTTLES DRAWN AEROBIC AND ANAEROBIC Blood Culture adequate volume Performed at [facility] [address] [place],  [address]    Culture   Final    NO GROWTH 3 DAYS Performed at [facility], [place] [address], [place],  [address]    Report Status PENDING  Incomplete  SARS Coronavirus 2 by RT PCR (hospital order, performed in [facility] hospital lab) Nasopharyngeal Nasopharyngeal Swab     Status: None   Collection Time: 03/24/20 10:42 AM   Specimen: Nasopharyngeal Swab  Result Value Ref Range Status   SARS Coronavirus 2 NEGATIVE NEGATIVE Final    Comment: (NOTE) SARS-CoV-2 target nucleic acids are NOT DETECTED.  The SARS-CoV-2 RNA is generally detectable in upper and lower respiratory specimens during the acute phase of infection. The  lowest concentration of SARS-CoV-2 viral copies this assay can detect is 250 copies / mL. A negative  result does not preclude SARS-CoV-2 infection and should not be used as the sole basis for treatment or other patient management decisions.  A negative result may occur with improper specimen collection / handling, submission of specimen other than nasopharyngeal swab, presence of viral mutation(s) within the areas targeted by this assay, and inadequate number of viral copies (<250 copies / mL). A negative result must be combined with clinical observations, patient history, and epidemiological information.  Fact Sheet for Patients:   StrictlyIdeas.no  Fact Sheet for Healthcare Providers: BankingDealers.co.za  This test is not yet approved or  cleared by the [place] FDA and has been authorized for detection and/or diagnosis of SARS-CoV-2 by FDA under an Emergency Use Authorization (EUA).  This EUA will remain in effect (meaning this test can be used) for the duration of the COVID-19 declaration under Section 564(b)(1) of the Act, 21 U.S.C. section 360bbb-3(b)(1), unless the authorization is terminated or revoked sooner.  Performed at [facility], [place] [address], [place], [place] [address]       Imaging Studies   US BIOPSY (LIVER)  Result Date: 03/24/2020 INDICATION: No known primary, now with multiple infiltrative liver lesions worrisome for metastatic disease. Please perform ultrasound-guided liver lesion biopsy for tissue diagnostic purposes. EXAM: ULTRASOUND GUIDED LIVER LESION BIOPSY COMPARISON:  CT abdomen pelvis-03/23/2020; 03/01/2020 MEDICATIONS: None ANESTHESIA/SEDATION: Fentanyl 100 mcg IV; Versed 2 mg IV Total Moderate Sedation time:  10 Minutes. The patient's level of consciousness and vital signs were monitored continuously by radiology nursing throughout the procedure under my direct supervision.  COMPLICATIONS: None immediate. PROCEDURE: Informed written consent was obtained from the patient after a discussion of the risks, benefits and alternatives to treatment. The patient understands and consents the procedure. A timeout was performed prior to the initiation of the procedure. Ultrasound scanning was performed of the right upper abdominal quadrant demonstrates multiple peripherally hypoechoic nodules and masses scattered throughout the liver compatible with findings seen on preceding noncontrast abdominal CT. A dominant approximately 2.4 x 2.2 cm hypoechoic nodule within the subcapsular aspect the right lobe of the liver, likely correlating with the dominant lesion seen on preceding abdominal CT image 26, series 2, was targeted for biopsy given lesion location and sonographic window (image 11). The procedure was planned. The right upper abdominal quadrant was prepped and draped in the usual sterile fashion. The overlying soft tissues were anesthetized with 1% lidocaine with epinephrine. A 17 gauge, 6.8 cm co-axial needle was advanced into a peripheral aspect of the lesion. This was followed by 6 core biopsies with an 18 gauge core device under direct ultrasound guidance. The coaxial needle tract was embolized with a small amount of Gel-Foam slurry and superficial hemostasis was obtained with manual compression. Post procedural scanning was negative for definitive area of hemorrhage or additional complication. A dressing was placed. The patient tolerated the procedure well without immediate post procedural complication. IMPRESSION: Technically successful ultrasound guided core needle biopsy of indeterminate lesion within the right lobe of the liver. Electronically Signed   By: Sandi Mariscal M.D.   On: 03/24/2020 15:27     Medications   Scheduled Meds: . amLODipine  10 mg Oral Daily  . aspirin EC  81 mg Oral Daily  . feeding supplement  1 Container Oral TID BM  . fluticasone furoate-vilanterol  1  puff Inhalation Daily  . montelukast  10 mg Oral Daily  . pantoprazole  40 mg Oral Daily  . polyethylene glycol  17 g Oral Daily  .  predniSONE  20 mg Oral Q breakfast  . spironolactone  25 mg Oral Daily   Continuous Infusions: . sodium chloride 10 mL/hr at 03/26/20 0718  . phytonadione (VITAMIN K) IV Stopped (03/25/20 1118)       LOS: 3 days    Time spent: 30 minutes    Ezekiel Slocumb, DO [facility]  03/26/2020, 8:01 AM    If 7PM-7AM, please contact night-coverage. How to contact the [facility] Attending or Consulting provider [place] or covering provider during after hours [place], for this patient?    1. Check the care team in [facility] and look for a) attending/consulting [facility] provider listed and b) the [facility] team listed 2. Log into [email or web address] and use [place]'s universal password to access. If you do not have the password, please contact the hospital operator. 3. Locate the [facility] provider you are looking for under [facility] and page to a number that you can be directly reached. 4. If you still have difficulty reaching the provider, please page the [facility] (Director on Call) for the Hospitalists listed on amion for assistance.

## 2020-03-27 ENCOUNTER — Ambulatory Visit (HOSPITAL_COMMUNITY): Payer: Medicare Other | Admitting: *Deleted

## 2020-03-27 ENCOUNTER — Inpatient Hospital Stay (HOSPITAL_COMMUNITY): Payer: Medicare Other

## 2020-03-27 DIAGNOSIS — C787 Secondary malignant neoplasm of liver and intrahepatic bile duct: Secondary | ICD-10-CM

## 2020-03-27 DIAGNOSIS — E875 Hyperkalemia: Secondary | ICD-10-CM

## 2020-03-27 DIAGNOSIS — Z7189 Other specified counseling: Secondary | ICD-10-CM

## 2020-03-27 DIAGNOSIS — Z515 Encounter for palliative care: Secondary | ICD-10-CM

## 2020-03-27 DIAGNOSIS — I639 Cerebral infarction, unspecified: Secondary | ICD-10-CM

## 2020-03-27 DIAGNOSIS — G893 Neoplasm related pain (acute) (chronic): Secondary | ICD-10-CM

## 2020-03-27 LAB — COMPREHENSIVE METABOLIC PANEL
ALT: 248 U/L — ABNORMAL HIGH (ref 0–44)
AST: 207 U/L — ABNORMAL HIGH (ref 15–41)
Albumin: 2.1 g/dL — ABNORMAL LOW (ref 3.5–5.0)
Alkaline Phosphatase: 656 U/L — ABNORMAL HIGH (ref 38–126)
Anion gap: 13 (ref 5–15)
BUN: 54 mg/dL — ABNORMAL HIGH (ref 8–23)
CO2: 22 mmol/L (ref 22–32)
Calcium: 7.5 mg/dL — ABNORMAL LOW (ref 8.9–10.3)
Chloride: 89 mmol/L — ABNORMAL LOW (ref 98–111)
Creatinine, Ser: 1.53 mg/dL — ABNORMAL HIGH (ref 0.61–1.24)
GFR calc Af Amer: 54 mL/min — ABNORMAL LOW (ref >60)
GFR calc non Af Amer: 46 mL/min — ABNORMAL LOW (ref >60)
Glucose, Bld: 96 mg/dL (ref 70–99)
Potassium: 5.6 mmol/L — ABNORMAL HIGH (ref 3.5–5.1)
Sodium: 124 mmol/L — ABNORMAL LOW (ref 135–145)
Total Bilirubin: 11.1 mg/dL — ABNORMAL HIGH (ref 0.3–1.2)
Total Protein: 6 g/dL — ABNORMAL LOW (ref 6.5–8.1)

## 2020-03-27 LAB — CUP PACEART REMOTE DEVICE CHECK
Date Time Interrogation Session: 20210621001755
Implantable Pulse Generator Implant Date: 20190424

## 2020-03-27 LAB — BASIC METABOLIC PANEL
Anion gap: 19 — ABNORMAL HIGH (ref 5–15)
BUN: 64 mg/dL — ABNORMAL HIGH (ref 8–23)
CO2: 18 mmol/L — ABNORMAL LOW (ref 22–32)
Calcium: 7.9 mg/dL — ABNORMAL LOW (ref 8.9–10.3)
Chloride: 89 mmol/L — ABNORMAL LOW (ref 98–111)
Creatinine, Ser: 1.75 mg/dL — ABNORMAL HIGH (ref 0.61–1.24)
GFR calc Af Amer: 46 mL/min — ABNORMAL LOW (ref >60)
GFR calc non Af Amer: 39 mL/min — ABNORMAL LOW (ref >60)
Glucose, Bld: 121 mg/dL — ABNORMAL HIGH (ref 70–99)
Potassium: 5.6 mmol/L — ABNORMAL HIGH (ref 3.5–5.1)
Sodium: 126 mmol/L — ABNORMAL LOW (ref 135–145)

## 2020-03-27 LAB — OSMOLALITY, URINE: Osmolality, Ur: 407 mOsm/kg (ref 300–900)

## 2020-03-27 LAB — CBC
HCT: 39.4 % (ref 39.0–52.0)
Hemoglobin: 13.5 g/dL (ref 13.0–17.0)
MCH: 26.4 pg (ref 26.0–34.0)
MCHC: 34.3 g/dL (ref 30.0–36.0)
MCV: 77 fL — ABNORMAL LOW (ref 80.0–100.0)
Platelets: 146 10*3/uL — ABNORMAL LOW (ref 150–400)
RBC: 5.12 MIL/uL (ref 4.22–5.81)
RDW: 16.7 % — ABNORMAL HIGH (ref 11.5–15.5)
WBC: 23.3 10*3/uL — ABNORMAL HIGH (ref 4.0–10.5)
nRBC: 0.1 % (ref 0.0–0.2)

## 2020-03-27 LAB — AMMONIA: Ammonia: 87 umol/L — ABNORMAL HIGH (ref 9–35)

## 2020-03-27 LAB — POTASSIUM: Potassium: 5 mmol/L (ref 3.5–5.1)

## 2020-03-27 LAB — PROTIME-INR
INR: 1.7 — ABNORMAL HIGH (ref 0.8–1.2)
Prothrombin Time: 19.1 seconds — ABNORMAL HIGH (ref 11.4–15.2)

## 2020-03-27 LAB — MAGNESIUM: Magnesium: 3.2 mg/dL — ABNORMAL HIGH (ref 1.7–2.4)

## 2020-03-27 MED ORDER — SODIUM POLYSTYRENE SULFONATE 15 GM/60ML PO SUSP
15.0000 g | Freq: Once | ORAL | Status: AC
  Administered 2020-03-27: 15 g via ORAL
  Filled 2020-03-27: qty 60

## 2020-03-27 MED ORDER — ONDANSETRON HCL 4 MG/2ML IJ SOLN
4.0000 mg | Freq: Four times a day (QID) | INTRAMUSCULAR | Status: DC | PRN
  Filled 2020-03-27: qty 2

## 2020-03-27 MED ORDER — SODIUM ZIRCONIUM CYCLOSILICATE 10 G PO PACK
10.0000 g | PACK | Freq: Once | ORAL | Status: AC
  Administered 2020-03-27: 10 g via ORAL
  Filled 2020-03-27: qty 1

## 2020-03-27 MED ORDER — SODIUM BICARBONATE 650 MG PO TABS
650.0000 mg | ORAL_TABLET | Freq: Two times a day (BID) | ORAL | Status: DC
  Administered 2020-03-27 – 2020-03-28 (×3): 650 mg via ORAL
  Filled 2020-03-27 (×5): qty 1

## 2020-03-27 MED ORDER — SODIUM BICARBONATE 8.4 % IV SOLN
50.0000 meq | Freq: Once | INTRAVENOUS | Status: AC
  Administered 2020-03-27: 50 meq via INTRAVENOUS
  Filled 2020-03-27: qty 50

## 2020-03-27 MED ORDER — PHYTONADIONE 5 MG PO TABS
10.0000 mg | ORAL_TABLET | Freq: Once | ORAL | Status: AC
  Administered 2020-03-27: 10 mg via ORAL
  Filled 2020-03-27: qty 2

## 2020-03-27 MED ORDER — HYDROMORPHONE HCL 2 MG PO TABS
2.0000 mg | ORAL_TABLET | ORAL | Status: DC | PRN
  Administered 2020-03-28: 2 mg via ORAL
  Filled 2020-03-27: qty 1

## 2020-03-27 MED ORDER — GADOBUTROL 1 MMOL/ML IV SOLN
10.0000 mL | Freq: Once | INTRAVENOUS | Status: AC | PRN
  Administered 2020-03-27: 10 mL via INTRAVENOUS

## 2020-03-27 MED ORDER — LACTULOSE 10 GM/15ML PO SOLN
20.0000 g | Freq: Three times a day (TID) | ORAL | Status: DC
  Administered 2020-03-27 – 2020-03-29 (×4): 20 g via ORAL
  Filled 2020-03-27 (×4): qty 30

## 2020-03-27 NOTE — Progress Notes (Signed)
PT Cancellation Note  Patient Details Name: Charles Mccoy MRN: [id number] DOB: 1951/09/08   Cancelled Treatment:    Reason Eval/Treat Not Completed: Patient at procedure or test/unavailable. Will check back on tomorrow.    [facility]  Office: [phone] Pager: [phone]

## 2020-03-27 NOTE — Progress Notes (Signed)
Progress Note   Subjective  Patient feels lethargic, otherwise has some tenderness at biopsy site but no other pain. Denies dysphagia. No fevers. Breathing at baseline. LAEs significantly worse compared to last Friday when last evaluated   Objective   Vital signs in last 24 hours: Temp:  [97.5 F (36.4 C)-98.6 F (37 C)] 98.6 F (37 C) (06/21 0447) Pulse Rate:  [72-82] 82 (06/21 0447) Resp:  [15-17] 17 (06/21 0447) BP: (110-122)/(79-80) 122/79 (06/21 0447) SpO2:  [93 %-96 %] 93 % (06/21 0447) Last BM Date: 03/26/20 General:    white male in NAD, jaundiced Heart:  Regular rate and rhythm Lungs: Respirations even and unlabored Abdomen:  Soft, some mild tenderness at biopsy site, nondistended Extremities:  (+) 1 edema LE. Neurologic:  Alert and oriented,  Psych:  Cooperative. Normal mood and affect.  Intake/Output from previous day: 06/20 0701 - 06/21 0700 In: 1475.6 [P.O.:1320; I.V.:107.4; IV Piggyback:48.2] Out: 900 [Urine:900] Intake/Output this shift: No intake/output data recorded.  Lab Results: Recent Labs    03/25/20 0329 03/26/20 0308 03/27/20 0316  WBC 19.2* 19.6* 23.3*  HGB 13.2 12.9* 13.5  HCT 39.0 37.8* 39.4  PLT 179 152 146*   BMET Recent Labs    03/25/20 0329 03/26/20 0308 03/27/20 0316  NA 123* 122* 124*  K 5.0 4.6 5.6*  CL 90* 91* 89*  CO2 19* 20* 22  GLUCOSE 89 128* 96  BUN 43* 46* 54*  CREATININE 1.43* 1.31* 1.53*  CALCIUM 7.3* 7.4* 7.5*   LFT Recent Labs    03/24/20 1700 03/25/20 0329 03/27/20 0316  PROT  --    < > 6.0*  ALBUMIN  --    < > 2.1*  AST  --    < > 207*  ALT  --    < > 248*  ALKPHOS  --    < > 656*  BILITOT  --    < > 11.1*  BILIDIR 4.0*  --   --    < > = values in this interval not displayed.   PT/INR Recent Labs    03/25/20 0329  LABPROT 18.8*  INR 1.6*    Studies/Results: DG CHEST PORT 1 VIEW  Result Date: 03/26/2020 CLINICAL DATA:  Dyspnea. EXAM: PORTABLE CHEST 1 VIEW COMPARISON:  03/20/2020  FINDINGS: Loop recorder projects over the left heart. Lungs are hypoinflated with mild stable elevation of the right hemidiaphragm. Possible mild linear atelectasis left base. No evidence of effusion. Cardiomediastinal silhouette and remainder the exam is unchanged. IMPRESSION: Hypoinflation without acute cardiopulmonary disease. Minimal linear atelectasis left base. Stable elevation right hemidiaphragm. Electronically Signed   By: Marin Olp M.D.   On: 03/26/2020 13:30       Assessment / Plan:   69 y/o male with malignancy of unknown primary with extensive liver mets, s/p liver biopsy last week, admitted with worsening liver enzymes / jaundice and synthetic dysfunction, elevated INR. Biopsy is pending, both CEA and CA 19-9 are elevated. Some esophageal thickening on CT but he has no dysphagia and recent CTs haven't shown significant abnormalities of the esophagus. Given vitamin K on admission.  Since he was last seen by our service on Friday his liver enzymes are worse with progressive rise in bilirubin, now 11s from 5s, with AP elevation to 600s and AST / AST moderately elevated to 200s. This is very likely from his global tumor burden / progressive malignancy, the question is if there is a focal area of stenosis /  stricture in the biliary tree that could be treated / stented with ERCP to relieve any biliary obstruction. I have spoken with radiology today, they reviewed his prosthesis and think okay for MRCP, his GFR remains > 30 so risk from contrast is quite low. I spoke with the patient and recommend MRCP today with and without contrast to further evaluate, he wishes to proceed with this. I will also recheck INR and give more vitamin K if needed. Hopefully path from liver biopsy is back today. Oncology following. His prognosis appears quite poor at this time, unclear if he is a candidate for chemo, spoke with Oncology they will see him and discuss goals of care and options when path is back.  Otherwise, persistent leukocytosis noted, no fevers. Patient is on some prednisone for COPD but would have low threshold for adding empiric antibiotics at this point.   Will await MRCP and path results with further recommendations.  Call with questions.  [place] Cellar, MD [facility] Gastroenterology

## 2020-03-27 NOTE — Plan of Care (Signed)
  Problem: Education: Goal: Knowledge of General Education information will improve Description: Including pain rating scale, medication(s)/side effects and non-pharmacologic comfort measures Outcome: Progressing   Problem: Coping: Goal: Level of anxiety will decrease Outcome: Progressing   Problem: Pain Managment: Goal: General experience of comfort will improve Outcome: Progressing   

## 2020-03-27 NOTE — Care Management Important Message (Signed)
Important Message  Patient Details IM Letter given to [name] Case Manager to present to the Patient Name: Charles Mccoy MRN: [id number] Date of Birth: [birth year]   Medicare Important Message Given:  Yes     [name] 03/27/2020, 11:54 AM

## 2020-03-27 NOTE — Progress Notes (Signed)
GI UPDATE: Reviewed MRCP with Dr. Jacalyn Lefevre of radiology who interpreted MRCP. Unfortunately there is marked worsening of hepatic metastatic disease, without focal area / stricture that would be amenable to treatment with ERCP / biliary drainage. The CBD and hepatic ducts are normal diameter and patent. There is some segmental areas of ductal distension peripherally in the liver due to mass effect but nothing radiology feels would provide benefit from external drainage. Unfortunately this is a very difficult situation. He has progressive jaundice, and coagulopathic from this due to liver dysfunction / liver failure. I spoke with the patient, patient's wife and daughter, and the palliative care team who was at bedside with the patient. They asked about his candidacy for liver transplant, unfortunately due to burden of disease and metastatic nature of his malignancy, he is not a candidate for transplant. Tissue path from biopsy is pending to clarify source, although given the worsening of his liver dysfunction and performance status, that may be prohibitive for treatment with chemotherapy, but will await oncology evaluation. Palliative care has discussed that option with him and they wish to hear more from oncology first.    Please call with questions.  [place] Cellar, MD [facility] Gastroenterology

## 2020-03-27 NOTE — Plan of Care (Signed)
  Problem: Education: Goal: Knowledge of General Education information will improve Description: Including pain rating scale, medication(s)/side effects and non-pharmacologic comfort measures Outcome: Progressing   Problem: Health Behavior/Discharge Planning: Goal: Ability to manage health-related needs will improve Outcome: Progressing   Problem: Clinical Measurements: Goal: Ability to maintain clinical measurements within normal limits will improve Outcome: Progressing Goal: Will remain free from infection Outcome: Progressing Goal: Diagnostic test results will improve Outcome: Progressing Goal: Respiratory complications will improve Outcome: Progressing Goal: Cardiovascular complication will be avoided Outcome: Progressing   Problem: Activity: Goal: Risk for activity intolerance will decrease Outcome: Progressing   Problem: Nutrition: Goal: Adequate nutrition will be maintained Outcome: Progressing   Problem: Coping: Goal: Level of anxiety will decrease Outcome: Progressing   Problem: Elimination: Goal: Will not experience complications related to bowel motility Outcome: Progressing   Problem: Safety: Goal: Ability to remain free from injury will improve Outcome: Progressing   Problem: Skin Integrity: Goal: Risk for impaired skin integrity will decrease Outcome: Progressing

## 2020-03-27 NOTE — Progress Notes (Signed)
PROGRESS NOTE    Charles Mccoy   XJO:[id number]  DOB: 04/04/1951  PCP: [name], [name], MD    DOA: 03/23/2020 LOS: 4   Brief Narrative   68 year old with history of COPD, CVA, CAD status post stent, HLD, HTN, recent finding of liver lesions sent to the hospital by oncology for transaminitis and hyperbilirubinemia.  Upon admission had lactic acidosis, hyponatremia, 1.66, BUN 45, transaminitis, elevated total bilirubin.  CT abdomen pelvis showed ascites, multifocal liver lesions.   Assessment & Plan    Hyperbilirubinemia/transaminitis/ascitesin the setting of progressive metastatic liver disease Liver biopsied on 6/18.  Pathology pending. CT abdomen pelvis showed small volume ascites. RUQ ultrasound showed diffuse liver metastases. Ammonia levels- 35 Patient of Dr. Burr Medico, oncology-notified Palliative care consulted Gastroenterology is also following.  Plan is for MRCP to rule out any biliary process. Prognosis appears to be poor at this time.  Patient noted to be a little bit more confused.  Recheck ammonia level.  Hyperkalemia Aldactone has been stopped.  Kayexalate x1.  Recheck labs this afternoon.  Abdominal pain Secondary to above.  Continue as needed pain medication and bowel regimen.    Currently on tramadol twice a day. Palliative care has been consulted.  Acute kidney injury Monitor urine output.  Renal function is stable.  Creatinine noted to be higher today.  Continue to monitor for now.  Hyponatremia Suspect hypervolemic hyponatremia in setting of liver disease as pt with distended abdomen and peripheral edema vs SIADH in setting of malignancy.  Urine electrolytes on admission: Na 31, K 31 (pt takes Aldactone).  Urine osmolality is 407 with a serum osmolality of 283.  Most likely SIADH. Patient was given Lasix yesterday with some improvement in sodium.  Due to rising creatinine we will hold off on further doses for now.    Mild COPD exacerbation with  Cough Recently outpatient prescribed prednisone taper and Z-Pak.  Will complete the course here.  Incentive spirometer flutter valve  Leukocytosis WBC continues to rise.  No evidence for any overt infection.  Could be due to steroids.  Chest x-ray from 6/20 did not show any infiltrates.    Generalized weakness Due to above.  PT evaluation.  Essential hypertension  Continue Norvasc.  Blood pressure is somewhat on the lower side.  Hold amlodipine for now.  History of CAD s/p stent Patient on aspirin Plavix statin.  Plavix was held for the liver biopsy.  Now resumed.  Cardiac cath July 2020-severe LAD stenosis status post DES.  Initially on aspirin and Brilinta then changed to Plavix  History of CVA, 2008 On aspirin and Plavix   Patient BMI: Body mass index is 29.23 kg/m.   DVT prophylaxis: SCDs Start: 03/23/20 2126   Diet:  Diet Orders (From admission, onward)    Start     Ordered   03/24/20 1352  Diet regular Room service appropriate? Yes; Fluid consistency: Thin  Diet effective now       Comments: NPO x 1 hr upon arrival to short stay unit or hospital room, than advance as tolerated to pre procedural diet.  Question Answer Comment  Room service appropriate? Yes   Fluid consistency: Thin      03/24/20 1351            Code Status: Full Code   Family Communication:  Discussed with wife.  Subjective 03/27/20    Patient complains of pain in the right side of his abdomen.  Denies any nausea vomiting.  Feels tired.  No shortness of breath.   Disposition Plan & Communication   Status is: Inpatient  Remains inpatient appropriate because:Inpatient level of care appropriate due to severity of illness   Dispo: The patient is from: Home              Anticipated d/c is to: Home              Anticipated d/c date is: 2-3 days              Patient currently is not medically stable to d/c.   Family Communication: none at bedside, will attempt to call.    Consults,  Procedures, Significant Events   Consultants:   Oncology  Gastroenterology  Palliative Care  Procedures:   Ultrasound-guided liver biopsy on 03/24/20   Objective   Vitals:   03/25/20 2108 03/26/20 0454 03/26/20 2025 03/27/20 0447  BP: 116/74 108/72 110/80 122/79  Pulse: 74 84 72 82  Resp: 16 18 15 17   Temp: 97.8 F (36.6 C) 98.1 F (36.7 C) (!) 97.5 F (36.4 C) 98.6 F (37 C)  TempSrc:      SpO2: 96% 93% 96% 93%  Weight:      Height:        Intake/Output Summary (Last 24 hours) at 03/27/2020 1153 Last data filed at 03/27/2020 0200 Gross per 24 hour  Intake 747.09 ml  Output 650 ml  Net 97.09 ml   Filed Weights   03/23/20 1611 03/24/20 0325  Weight: 107.5 kg 107.5 kg    Physical Exam:  General appearance: Somnolent but easily arousable.  No distress.  Noted to be in some discomfort. Resp: Diminished air entry at the bases.  Otherwise clear to auscultation.  Normal effort.   Cardio: S1-S2 is normal regular.  No S3-S4.  No rubs murmurs or bruit GI: Abdomen is soft.  Tender in the right upper quadrant without any rebound rigidity or guarding.  No masses organomegaly.   Extremities: No edema.  Full range of motion of lower extremities. Neurologic: Alert and oriented x3.  No focal neurological deficits.    Labs   Data Reviewed: I have personally reviewed following labs and imaging studies  CBC: Recent Labs  Lab 03/20/20 1322 03/21/20 0852 03/21/20 0852 03/23/20 1339 03/23/20 1852 03/24/20 0312 03/25/20 0329 03/26/20 0308 03/27/20 0316  WBC   < > 18.1*   < > 20.7* 16.9* 16.9* 19.2* 19.6* 23.3*  NEUTROABS  --  15.4*  --  17.8* 14.9*  --   --   --   --   HGB   < > 12.9*   < > 13.2 12.7* 12.3* 13.2 12.9* 13.5  HCT   < > 38.1*   < > 38.7* 37.8* 36.6* 39.0 37.8* 39.4  MCV   < > 75.7*   < > 76.0* 77.9* 77.4* 78.2* 77.1* 77.0*  PLT   < > 200   < > 226 199 171 179 152 146*   < > = values in this interval not displayed.   Basic Metabolic Panel: Recent Labs    Lab 03/23/20 2206 03/24/20 0312 03/25/20 0329 03/26/20 0308 03/27/20 0316  NA 125* 123* 123* 122* 124*  K 5.4* 4.6 5.0 4.6 5.6*  CL 90* 91* 90* 91* 89*  CO2 20* 20* 19* 20* 22  GLUCOSE 96 128* 89 128* 96  BUN 42* 41* 43* 46* 54*  CREATININE 1.50* 1.43* 1.43* 1.31* 1.53*  CALCIUM 7.5* 7.3* 7.3* 7.4* 7.5*  MG  --   --  3.1* 3.3* 3.2*   GFR: Estimated Creatinine Clearance: 62.6 mL/min (A) (by C-G formula based on SCr of 1.53 mg/dL (H)). Liver Function Tests: Recent Labs  Lab 03/23/20 1852 03/24/20 0312 03/25/20 0329 03/26/20 0308 03/27/20 0316  AST 209* 196* 185* 191* 207*  ALT 221* 209* 225* 230* 248*  ALKPHOS 614* 560* 649* 644* 656*  BILITOT 5.4* 5.5* 6.6* 8.4* 11.1*  PROT 6.2* 5.8* 6.1* 5.7* 6.0*  ALBUMIN 2.3* 2.2* 2.2* 2.0* 2.1*   Recent Labs  Lab 03/23/20 1852  LIPASE 35   Recent Labs  Lab 03/23/20 2207  AMMONIA 35   Coagulation Profile: Recent Labs  Lab 03/21/20 0750 03/23/20 1340 03/24/20 0823 03/25/20 0329 03/27/20 1005  INR 1.4* 1.7* 1.6* 1.6* 1.7*   Sepsis Labs: Recent Labs  Lab 03/23/20 1853 03/23/20 2300  LATICACIDVEN 3.6* 3.6*    Recent Results (from the past 240 hour(s))  Blood culture (routine x 2)     Status: None (Preliminary result)   Collection Time: 03/23/20  6:53 PM   Specimen: BLOOD  Result Value Ref Range Status   Specimen Description   Final    BLOOD LEFT ANTECUBITAL Performed at [facility], [place] [address] [place], [place] [address]    Special Requests   Final    BOTTLES DRAWN AEROBIC AND ANAEROBIC Blood Culture adequate volume Performed at [place] [address], [place], [place] [address]    Culture   Final    NO GROWTH 4 DAYS Performed at [facility], [place] [address] [place], [place] [address]    Report Status PENDING  Incomplete  Blood culture (routine x 2)     Status: None (Preliminary result)   Collection Time: 03/23/20  6:53 PM   Specimen: BLOOD RIGHT  FOREARM  Result Value Ref Range Status   Specimen Description   Final    BLOOD RIGHT FOREARM Performed at [facility] [address], [place], [place] [address]    Special Requests   Final    BOTTLES DRAWN AEROBIC AND ANAEROBIC Blood Culture adequate volume Performed at [place] [address] [place], [place] [address]    Culture   Final    NO GROWTH 4 DAYS Performed at [facility], [place] [address], [place],  [address]    Report Status PENDING  Incomplete  SARS Coronavirus 2 by RT PCR (hospital order, performed in [facility] hospital lab) Nasopharyngeal Nasopharyngeal Swab     Status: None   Collection Time: 03/24/20 10:42 AM   Specimen: Nasopharyngeal Swab  Result Value Ref Range Status   SARS Coronavirus 2 NEGATIVE NEGATIVE Final    Comment: (NOTE) SARS-CoV-2 target nucleic acids are NOT DETECTED.  The SARS-CoV-2 RNA is generally detectable in upper and lower respiratory specimens during the acute phase of infection. The lowest concentration of SARS-CoV-2 viral copies this assay can detect is 250 copies / mL. A negative result does not preclude SARS-CoV-2 infection and should not be used as the sole basis for treatment or other patient management decisions.  A negative result may occur with improper specimen collection / handling, submission of specimen other than nasopharyngeal swab, presence of viral mutation(s) within the areas targeted by this assay, and inadequate number of viral copies (<250 copies / mL). A negative result must be combined with clinical observations, patient history, and epidemiological information.  Fact Sheet for Patients:   StrictlyIdeas.no  Fact Sheet for Healthcare Providers: BankingDealers.co.za  This test is not yet approved or  cleared by the [place] and has been authorized for detection and/or diagnosis of SARS-CoV-2 by FDA  under an Emergency Use Authorization (EUA).  This EUA will remain in effect (meaning this test can be used) for the duration of the COVID-19 declaration under Section 564(b)(1) of the Act, 21 U.S.C. section 360bbb-3(b)(1), unless the authorization is terminated or revoked sooner.  Performed at [facility], [place] [address], [place], [place] [address]       Imaging Studies   DG CHEST PORT 1 VIEW  Result Date: 03/26/2020 CLINICAL DATA:  Dyspnea. EXAM: PORTABLE CHEST 1 VIEW COMPARISON:  03/20/2020 FINDINGS: Loop recorder projects over the left heart. Lungs are hypoinflated with mild stable elevation of the right hemidiaphragm. Possible mild linear atelectasis left base. No evidence of effusion. Cardiomediastinal silhouette and remainder the exam is unchanged. IMPRESSION: Hypoinflation without acute cardiopulmonary disease. Minimal linear atelectasis left base. Stable elevation right hemidiaphragm. Electronically Signed   By: Marin Olp M.D.   On: 03/26/2020 13:30     Medications   Scheduled Meds: . amLODipine  10 mg Oral Daily  . aspirin EC  81 mg Oral Daily  . clopidogrel  75 mg Oral Daily  . feeding supplement  1 Container Oral TID BM  . fluticasone furoate-vilanterol  1 puff Inhalation Daily  . montelukast  10 mg Oral Daily  . pantoprazole  40 mg Oral Daily  . polyethylene glycol  17 g Oral Daily  . predniSONE  20 mg Oral Q breakfast  . traMADol  50 mg Oral Q12H   Continuous Infusions: . sodium chloride Stopped (03/26/20 1344)       LOS: 4 days     Bonnielee Haff,  [facility]  03/27/2020, 11:53 AM    If 7PM-7AM, please contact night-coverage. How to contact the [facility] Attending or Consulting provider [place] or covering provider during after hours [place], for this patient?    1. Check the care team in [facility] and look for a) attending/consulting [facility] provider listed and b) the [facility] team listed 2. Log into [email or web address] and use [facility]'s  universal password to access. If you do not have the password, please contact the hospital operator. 3. Locate the [facility] provider you are looking for under [facility] and page to a number that you can be directly reached. 4. If you still have difficulty reaching the provider, please page the [facility] (Director on Call) for the Hospitalists listed on amion for assistance.

## 2020-03-28 DIAGNOSIS — R52 Pain, unspecified: Secondary | ICD-10-CM

## 2020-03-28 DIAGNOSIS — R531 Weakness: Secondary | ICD-10-CM

## 2020-03-28 DIAGNOSIS — N179 Acute kidney failure, unspecified: Secondary | ICD-10-CM

## 2020-03-28 LAB — PROTIME-INR
INR: 1.9 — ABNORMAL HIGH (ref 0.8–1.2)
Prothrombin Time: 21.1 seconds — ABNORMAL HIGH (ref 11.4–15.2)

## 2020-03-28 LAB — COMPREHENSIVE METABOLIC PANEL
ALT: 249 U/L — ABNORMAL HIGH (ref 0–44)
AST: 212 U/L — ABNORMAL HIGH (ref 15–41)
Albumin: 2 g/dL — ABNORMAL LOW (ref 3.5–5.0)
Alkaline Phosphatase: 658 U/L — ABNORMAL HIGH (ref 38–126)
Anion gap: 18 — ABNORMAL HIGH (ref 5–15)
BUN: 73 mg/dL — ABNORMAL HIGH (ref 8–23)
CO2: 20 mmol/L — ABNORMAL LOW (ref 22–32)
Calcium: 7.4 mg/dL — ABNORMAL LOW (ref 8.9–10.3)
Chloride: 86 mmol/L — ABNORMAL LOW (ref 98–111)
Creatinine, Ser: 2.05 mg/dL — ABNORMAL HIGH (ref 0.61–1.24)
GFR calc Af Amer: 38 mL/min — ABNORMAL LOW (ref >60)
GFR calc non Af Amer: 33 mL/min — ABNORMAL LOW (ref >60)
Glucose, Bld: 117 mg/dL — ABNORMAL HIGH (ref 70–99)
Potassium: 5.4 mmol/L — ABNORMAL HIGH (ref 3.5–5.1)
Sodium: 124 mmol/L — ABNORMAL LOW (ref 135–145)
Total Bilirubin: 12.8 mg/dL — ABNORMAL HIGH (ref 0.3–1.2)
Total Protein: 5.8 g/dL — ABNORMAL LOW (ref 6.5–8.1)

## 2020-03-28 LAB — CBC
HCT: 42.4 % (ref 39.0–52.0)
Hemoglobin: 14.6 g/dL (ref 13.0–17.0)
MCH: 26.2 pg (ref 26.0–34.0)
MCHC: 34.4 g/dL (ref 30.0–36.0)
MCV: 76.1 fL — ABNORMAL LOW (ref 80.0–100.0)
Platelets: 141 10*3/uL — ABNORMAL LOW (ref 150–400)
RBC: 5.57 MIL/uL (ref 4.22–5.81)
RDW: 17.6 % — ABNORMAL HIGH (ref 11.5–15.5)
WBC: 22.1 10*3/uL — ABNORMAL HIGH (ref 4.0–10.5)
nRBC: 0.2 % (ref 0.0–0.2)

## 2020-03-28 LAB — CULTURE, BLOOD (ROUTINE X 2)
Culture: NO GROWTH
Culture: NO GROWTH
Special Requests: ADEQUATE
Special Requests: ADEQUATE

## 2020-03-28 LAB — AMMONIA: Ammonia: 64 umol/L — ABNORMAL HIGH (ref 9–35)

## 2020-03-28 LAB — MAGNESIUM: Magnesium: 3.3 mg/dL — ABNORMAL HIGH (ref 1.7–2.4)

## 2020-03-28 MED ORDER — OXYCODONE HCL 5 MG PO TABS: 10.0000 mg | ORAL_TABLET | ORAL | Status: DC | PRN

## 2020-03-28 MED ORDER — FUROSEMIDE 10 MG/ML IJ SOLN
60.0000 mg | Freq: Once | INTRAMUSCULAR | Status: AC
  Administered 2020-03-28: 60 mg via INTRAVENOUS
  Filled 2020-03-28: qty 6

## 2020-03-28 MED ORDER — SODIUM ZIRCONIUM CYCLOSILICATE 10 G PO PACK
10.0000 g | PACK | Freq: Once | ORAL | Status: AC
  Administered 2020-03-28: 10 g via ORAL
  Filled 2020-03-28: qty 1

## 2020-03-28 MED ORDER — HYDROMORPHONE HCL 1 MG/ML IJ SOLN: 1.0000 mg | INTRAMUSCULAR | Status: DC | PRN

## 2020-03-28 NOTE — Progress Notes (Signed)
PROGRESS NOTE    Charles Mccoy   OYD:[id number]  DOB: 1951/01/31  PCP: [name], [name], MD    DOA: 03/23/2020 LOS: 5   Brief Narrative   69 year old with history of COPD, CVA, CAD status post stent, HLD, HTN, recent finding of liver lesions sent to the hospital by oncology for transaminitis and hyperbilirubinemia.  Upon admission had lactic acidosis, hyponatremia, 1.66, BUN 45, transaminitis, elevated total bilirubin.  CT abdomen pelvis showed ascites, multifocal liver lesions.   Assessment & Plan   Diffuse liver metastases of unknown primary with transaminitis and hyperbilirubinemia Patient was seen by gastroenterology and patient underwent liver biopsy on 6/18. Pathology is still pending. Oncology was also consulted. CT of the abdomen pelvis showed small volume ascites. Palliative care was also consulted. Patient underwent MRCP which showed significant tumor burden but no biliary strictures. Patient noted to have elevated CA 19-9 as well as CEA.  Further plan based on results of pathology and oncology discussion with the patient and his family.  Acute hepatic encephalopathy/coagulopathy/liver failure Patient noted to have elevated ammonia level along with elevated PT/INR. He has been given vitamin K. He was also started on lactulose yesterday. From 87-64. Continue current dose. No focal deficits noted.  Acute kidney injury/hyperkalemia Patient's creatinine continues to climb along with BUN. He is noted to be quite edematous. He has ascites. He appears to be volume overloaded. Has not had much urine output in the last 24 to 48 hours. We will give him a dose of Lasix. Bladder scan. Give him another dose of Lokelma today. Aldactone was discontinued yesterday.   Abdominal pain Secondary to liver mets.  Continue as needed pain medication and bowel regimen.    Palliative care has been consulted.  Hyponatremia Suspect hypervolemic hyponatremia in setting of liver disease as pt  with distended abdomen and peripheral edema vs SIADH in setting of malignancy.  Urine electrolytes on admission: Na 31, K 31 (pt takes Aldactone).  Urine osmolality is 407 with a serum osmolality of 283.  Most likely SIADH. Patient was given intravenous Lasix. Sodium level is stable for the most part.  Mild COPD exacerbation with Cough Seems to be stable. Stop steroids.  Leukocytosis WBC has remained elevated most likely due to steroids. Chest x-ray from 6/20 did not show any infiltrates. No fever. Continue to monitor for now.   Generalized weakness Due to above.  PT evaluation.  Essential hypertension  Amlodipine on hold due to borderline low blood pressures.   History of CAD s/p stent Patient on aspirin Plavix statin.  Plavix was held for the liver biopsy.  Now resumed.  Cardiac cath July 2020-severe LAD stenosis status post DES.  Initially on aspirin and Brilinta then changed to Plavix  History of CVA, 2008 On aspirin and Plavix   Patient BMI: Body mass index is 29.23 kg/m.   DVT prophylaxis: SCDs Start: 03/23/20 2126   Diet:  Diet Orders (From admission, onward)    Start     Ordered   03/24/20 1352  Diet regular Room service appropriate? Yes; Fluid consistency: Thin  Diet effective now       Comments: NPO x 1 hr upon arrival to short stay unit or hospital room, than advance as tolerated to pre procedural diet.  Question Answer Comment  Room service appropriate? Yes   Fluid consistency: Thin      03/24/20 1351            Code Status: Full Code   Family Communication:  Discussed with wife.  Subjective 03/28/20    Patient remains somnolent though easily arousable. Continues to have pain in his right abdomen. Denies any other complaints at this time.   Disposition Plan & Communication   Status is: Inpatient  Remains inpatient appropriate because:Inpatient level of care appropriate due to severity of illness   Dispo: The patient is from: Home               Anticipated d/c is to: Home              Anticipated d/c date is: 2-3 days              Patient currently is not medically stable to d/c.   Family Communication: Discussed with his wife yesterday.   Consults, Procedures, Significant Events   Consultants:   Oncology  Gastroenterology  Palliative Care  Procedures:   Ultrasound-guided liver biopsy on 03/24/20   Objective   Vitals:   03/27/20 2234 03/28/20 0549 03/28/20 0752 03/28/20 1042  BP: 117/74 105/69  112/67  Pulse: 87 84  90  Resp:  15    Temp: 98.4 F (36.9 C) 97.6 F (36.4 C)  98 F (36.7 C)  TempSrc: Oral Oral  Oral  SpO2: 95% 94% 93% 96%  Weight:      Height:        Intake/Output Summary (Last 24 hours) at 03/28/2020 1047 Last data filed at 03/28/2020 0400 Gross per 24 hour  Intake 360 ml  Output 250 ml  Net 110 ml   Filed Weights   03/23/20 1611 03/24/20 0325  Weight: 107.5 kg 107.5 kg    Physical Exam:  General appearance: Somnolent but easily arousable. In no distress.  Resp: Diminished air entry at the bases with a few crackles. No wheezing. No rhonchi. Normal effort. Cardio: S1-S2 is normal regular.  No S3-S4.  No rubs murmurs or bruit GI: Abdomen is soft. Mildly distended. Tender in the right upper quadrant without any rebound rigidity or guarding. Extremities: Edema noted bilateral lower extremities. Neurologic: Disoriented. No obvious focal neurological deficits.    Labs   Data Reviewed: I have personally reviewed following labs and imaging studies  CBC: Recent Labs  Lab 03/23/20 1339 03/23/20 1339 03/23/20 1852 03/23/20 1852 03/24/20 0312 03/25/20 0329 03/26/20 0308 03/27/20 0316 03/28/20 0309  WBC 20.7*  --  16.9*   < > 16.9* 19.2* 19.6* 23.3* 22.1*  NEUTROABS 17.8*  --  14.9*  --   --   --   --   --   --   HGB 13.2  --  12.7*   < > 12.3* 13.2 12.9* 13.5 14.6  HCT 38.7*   < > 37.8*   < > 36.6* 39.0 37.8* 39.4 42.4  MCV 76.0*   < > 77.9*   < > 77.4* 78.2* 77.1* 77.0*  76.1*  PLT 226  --  199   < > 171 179 152 146* 141*   < > = values in this interval not displayed.   Basic Metabolic Panel: Recent Labs  Lab 03/25/20 0329 03/25/20 0329 03/26/20 0308 03/27/20 0316 03/27/20 1409 03/27/20 2023 03/28/20 0309  NA 123*  --  122* 124* 126*  --  124*  K 5.0   < > 4.6 5.6* 5.6* 5.0 5.4*  CL 90*  --  91* 89* 89*  --  86*  CO2 19*  --  20* 22 18*  --  20*  GLUCOSE 89  --  128* 96 121*  --  117*  BUN 43*  --  46* 54* 64*  --  73*  CREATININE 1.43*  --  1.31* 1.53* 1.75*  --  2.05*  CALCIUM 7.3*  --  7.4* 7.5* 7.9*  --  7.4*  MG 3.1*  --  3.3* 3.2*  --   --  3.3*   < > = values in this interval not displayed.   GFR: Estimated Creatinine Clearance: 46.7 mL/min (A) (by C-G formula based on SCr of 2.05 mg/dL (H)). Liver Function Tests: Recent Labs  Lab 03/24/20 0312 03/25/20 0329 03/26/20 0308 03/27/20 0316 03/28/20 0309  AST 196* 185* 191* 207* 212*  ALT 209* 225* 230* 248* 249*  ALKPHOS 560* 649* 644* 656* 658*  BILITOT 5.5* 6.6* 8.4* 11.1* 12.8*  PROT 5.8* 6.1* 5.7* 6.0* 5.8*  ALBUMIN 2.2* 2.2* 2.0* 2.1* 2.0*   Recent Labs  Lab 03/23/20 1852  LIPASE 35   Recent Labs  Lab 03/23/20 2207 03/27/20 1409 03/28/20 0309  AMMONIA 35 87* 64*   Coagulation Profile: Recent Labs  Lab 03/23/20 1340 03/24/20 0823 03/25/20 0329 03/27/20 1005 03/28/20 0309  INR 1.7* 1.6* 1.6* 1.7* 1.9*   Sepsis Labs: Recent Labs  Lab 03/23/20 1853 03/23/20 2300  LATICACIDVEN 3.6* 3.6*    Recent Results (from the past 240 hour(s))  Blood culture (routine x 2)     Status: None   Collection Time: 03/23/20  6:53 PM   Specimen: BLOOD  Result Value Ref Range Status   Specimen Description   Final    BLOOD LEFT ANTECUBITAL Performed at [facility], [place] [address], [place], [place] [address]    Special Requests   Final    BOTTLES DRAWN AEROBIC AND ANAEROBIC Blood Culture adequate volume Performed at [place] [address] [place], [place] [address]    Culture   Final    NO GROWTH 5 DAYS Performed at [facility], [place] [address], [place], [place] [address]    Report Status 03/28/2020 FINAL  Final  Blood culture (routine x 2)     Status: None   Collection Time: 03/23/20  6:53 PM   Specimen: BLOOD RIGHT FOREARM  Result Value Ref Range Status   Specimen Description   Final    BLOOD RIGHT FOREARM Performed at [place] [address], [place], [place] [address]    Special Requests   Final    BOTTLES DRAWN AEROBIC AND ANAEROBIC Blood Culture adequate volume Performed at [place] [address] [place], [place] [address]    Culture   Final    NO GROWTH 5 DAYS Performed at [facility], [place] [address], [place],  [address]    Report Status 03/28/2020 FINAL  Final  SARS Coronavirus 2 by RT PCR (hospital order, performed in [facility] hospital lab) Nasopharyngeal Nasopharyngeal Swab     Status: None   Collection Time: 03/24/20 10:42 AM   Specimen: Nasopharyngeal Swab  Result Value Ref Range Status   SARS Coronavirus 2 NEGATIVE NEGATIVE Final    Comment: (NOTE) SARS-CoV-2 target nucleic acids are NOT DETECTED.  The SARS-CoV-2 RNA is generally detectable in upper and lower respiratory specimens during the acute phase of infection. The lowest concentration of SARS-CoV-2 viral copies this assay can detect is 250 copies / mL. A negative result does not preclude SARS-CoV-2 infection and should not be used as the sole basis for treatment or other patient management decisions.  A negative  result may occur with improper specimen collection / handling, submission of specimen other than nasopharyngeal swab, presence of viral mutation(s) within the areas targeted by this assay, and inadequate number of viral copies (<250 copies / mL). A negative result must be combined with clinical observations, patient history, and epidemiological  information.  Fact Sheet for Patients:   StrictlyIdeas.no  Fact Sheet for Healthcare Providers: BankingDealers.co.za  This test is not yet approved or  cleared by the [place] FDA and has been authorized for detection and/or diagnosis of SARS-CoV-2 by FDA under an Emergency Use Authorization (EUA).  This EUA will remain in effect (meaning this test can be used) for the duration of the COVID-19 declaration under Section 564(b)(1) of the Act, 21 U.S.C. section 360bbb-3(b)(1), unless the authorization is terminated or revoked sooner.  Performed at [facility], [place] [address], [place], [place] [address]       Imaging Studies   MR 3D Recon At Scanner  Result Date: 03/27/2020 CLINICAL DATA:  Jaundice extensive liver metastases from unknown primary EXAM: MRI ABDOMEN WITHOUT AND WITH CONTRAST (INCLUDING MRCP) TECHNIQUE: Multiplanar multisequence MR imaging of the abdomen was performed both before and after the administration of intravenous contrast. Heavily T2-weighted images of the biliary and pancreatic ducts were obtained, and three-dimensional MRCP images were rendered by post processing. CONTRAST:  32mL GADAVIST GADOBUTROL 1 MMOL/ML IV SOLN COMPARISON:  CT abdomen and pelvis of March 23, 2020 FINDINGS: Lower chest: RIGHT-sided pleural effusion similar to previous evaluation. Is basilar airspace disease. Similar to prior study. Mediastinal adenopathy better displayed on a recent CT angiogram of the chest. Hepatobiliary: Marked worsening of hepatic metastatic disease as on the recent CT of the abdomen and pelvis near confluent in the LEFT hepatic lobe. Gallbladder is decompressed otherwise unremarkable. Areas of segmental biliary ductal distension in the RIGHT hepatic lobe seen to anterior inferior RIGHT hemi liver and anterior superior RIGHT hemi liver likely due to local mass effect. Common duct is nondilated as are RIGHT  and LEFT hepatic ducts. Gallbladder is collapsed upon itself likely accounting for the appearance on the previous imaging study. Portal vein is patent. Pancreas: No mass, inflammatory changes, or other parenchymal abnormality identified. Spleen:  Normal size without suspicious focal lesion. Adrenals/Urinary Tract: Adrenal glands are normal. LEFT renal cyst. No hydronephrosis. Stomach/Bowel: Limited assessment of the gastrointestinal tract with moderate colonic distension in the upper abdomen nearly 7 cm. Vascular/Lymphatic: Atheromatous plaque of the abdominal aorta, no aneurysmal dilation. Adenopathy in the retroperitoneum and upper abdomen is similar to the previous study. Other:  Ascites better demonstrated on CT of the abdomen and pelvis. Musculoskeletal: No suspicious bone lesions identified. IMPRESSION: 1. Marked worsening of hepatic metastatic disease as on the recent CT of the abdomen and pelvis near confluent in the LEFT hepatic lobe. 2. Areas of segmental biliary ductal distension in the RIGHT hepatic lobe seen to anterior inferior RIGHT hemi liver and anterior superior RIGHT hemi liver likely due to local mass effect. Scattered small peripheral biliary radicles throughout the liver in other areas with less dilation, essentially mild peripheral dilation elsewhere. This is likely due to local areas of mass effect in the setting of worsening of hepatic metastatic disease which may also explain the degree of hepatic dysfunction. 3. Collapsed gallbladder 4. Common duct is nondilated as are RIGHT and LEFT hepatic ducts. 5. Adenopathy in the retroperitoneum and upper abdomen is similar to the previous study. Also on some series adenopathy in the chest is partially imaged, better  seen on recent chest imaging from May of 2021 but likely enlarged since that time. 6. Limited assessment of the gastrointestinal tract with moderate colonic distension in the upper abdomen nearly 7 cm. Correlate with any clinical signs  of developing colonic ileus. 7. RIGHT-sided pleural effusion similar to previous evaluation. 8. Aortic atherosclerosis. Aortic Atherosclerosis (ICD10-I70.0). Electronically Signed   By: Zetta Bills M.D.   On: 03/27/2020 15:35   DG CHEST PORT 1 VIEW  Result Date: 03/26/2020 CLINICAL DATA:  Dyspnea. EXAM: PORTABLE CHEST 1 VIEW COMPARISON:  03/20/2020 FINDINGS: Loop recorder projects over the left heart. Lungs are hypoinflated with mild stable elevation of the right hemidiaphragm. Possible mild linear atelectasis left base. No evidence of effusion. Cardiomediastinal silhouette and remainder the exam is unchanged. IMPRESSION: Hypoinflation without acute cardiopulmonary disease. Minimal linear atelectasis left base. Stable elevation right hemidiaphragm. Electronically Signed   By: Marin Olp M.D.   On: 03/26/2020 13:30   MR ABDOMEN MRCP W WO CONTAST  Result Date: 03/27/2020 CLINICAL DATA:  Jaundice extensive liver metastases from unknown primary EXAM: MRI ABDOMEN WITHOUT AND WITH CONTRAST (INCLUDING MRCP) TECHNIQUE: Multiplanar multisequence MR imaging of the abdomen was performed both before and after the administration of intravenous contrast. Heavily T2-weighted images of the biliary and pancreatic ducts were obtained, and three-dimensional MRCP images were rendered by post processing. CONTRAST:  28mL GADAVIST GADOBUTROL 1 MMOL/ML IV SOLN COMPARISON:  CT abdomen and pelvis of March 23, 2020 FINDINGS: Lower chest: RIGHT-sided pleural effusion similar to previous evaluation. Is basilar airspace disease. Similar to prior study. Mediastinal adenopathy better displayed on a recent CT angiogram of the chest. Hepatobiliary: Marked worsening of hepatic metastatic disease as on the recent CT of the abdomen and pelvis near confluent in the LEFT hepatic lobe. Gallbladder is decompressed otherwise unremarkable. Areas of segmental biliary ductal distension in the RIGHT hepatic lobe seen to anterior inferior RIGHT hemi  liver and anterior superior RIGHT hemi liver likely due to local mass effect. Common duct is nondilated as are RIGHT and LEFT hepatic ducts. Gallbladder is collapsed upon itself likely accounting for the appearance on the previous imaging study. Portal vein is patent. Pancreas: No mass, inflammatory changes, or other parenchymal abnormality identified. Spleen:  Normal size without suspicious focal lesion. Adrenals/Urinary Tract: Adrenal glands are normal. LEFT renal cyst. No hydronephrosis. Stomach/Bowel: Limited assessment of the gastrointestinal tract with moderate colonic distension in the upper abdomen nearly 7 cm. Vascular/Lymphatic: Atheromatous plaque of the abdominal aorta, no aneurysmal dilation. Adenopathy in the retroperitoneum and upper abdomen is similar to the previous study. Other:  Ascites better demonstrated on CT of the abdomen and pelvis. Musculoskeletal: No suspicious bone lesions identified. IMPRESSION: 1. Marked worsening of hepatic metastatic disease as on the recent CT of the abdomen and pelvis near confluent in the LEFT hepatic lobe. 2. Areas of segmental biliary ductal distension in the RIGHT hepatic lobe seen to anterior inferior RIGHT hemi liver and anterior superior RIGHT hemi liver likely due to local mass effect. Scattered small peripheral biliary radicles throughout the liver in other areas with less dilation, essentially mild peripheral dilation elsewhere. This is likely due to local areas of mass effect in the setting of worsening of hepatic metastatic disease which may also explain the degree of hepatic dysfunction. 3. Collapsed gallbladder 4. Common duct is nondilated as are RIGHT and LEFT hepatic ducts. 5. Adenopathy in the retroperitoneum and upper abdomen is similar to the previous study. Also on some series adenopathy in the chest is partially imaged,  better seen on recent chest imaging from May of 2021 but likely enlarged since that time. 6. Limited assessment of the  gastrointestinal tract with moderate colonic distension in the upper abdomen nearly 7 cm. Correlate with any clinical signs of developing colonic ileus. 7. RIGHT-sided pleural effusion similar to previous evaluation. 8. Aortic atherosclerosis. Aortic Atherosclerosis (ICD10-I70.0). Electronically Signed   By: Zetta Bills M.D.   On: 03/27/2020 15:35   CUP PACEART REMOTE DEVICE CHECK  Result Date: 03/27/2020 [facility] summary report received. Battery status OK. Normal device function. No new symptom episodes, tachy episodes, brady, or pause episodes. No new AF episodes. Monthly summary reports and ROV/PRN. Felisa Bonier, RN, MSN    Medications   Scheduled Meds: . aspirin EC  81 mg Oral Daily  . clopidogrel  75 mg Oral Daily  . feeding supplement  1 Container Oral TID BM  . fluticasone furoate-vilanterol  1 puff Inhalation Daily  . lactulose  20 g Oral TID  . montelukast  10 mg Oral Daily  . pantoprazole  40 mg Oral Daily  . polyethylene glycol  17 g Oral Daily  . sodium bicarbonate  650 mg Oral BID   Continuous Infusions: . sodium chloride Stopped (03/26/20 1344)       LOS: 5 days     Bonnielee Haff,  [facility]  03/28/2020, 10:47 AM    If 7PM-7AM, please contact night-coverage. How to contact the [facility] Attending or Consulting provider [place] or covering provider during after hours [place], for this patient?    1. Check the care team in [facility] and look for a) attending/consulting [facility] provider listed and b) the [facility] team listed 2. Log into [email or web address] and use [facility]'s universal password to access. If you do not have the password, please contact the hospital operator. 3. Locate the [facility] provider you are looking for under [facility] and page to a number that you can be directly reached. 4. If you still have difficulty reaching the provider, please page the [facility] (Director on Call) for the Hospitalists listed on amion for assistance.

## 2020-03-28 NOTE — Progress Notes (Signed)
Daily Progress Note   Patient Name: Charles Mccoy       Date: 03/28/2020 DOB: [birth year]  Age: 69 y.o. MRN#: [id number] Attending Physician: Bonnielee Haff, MD Primary Care Physician: [name], [name], MD Admit Date: 03/23/2020  Reason for Consultation/Follow-up: Establishing goals of care  Subjective: Complains of pain, appears restless. Wife and daughter at bedside.   Length of Stay: 5  Current Medications: Scheduled Meds:  . aspirin EC  81 mg Oral Daily  . clopidogrel  75 mg Oral Daily  . feeding supplement  1 Container Oral TID BM  . fluticasone furoate-vilanterol  1 puff Inhalation Daily  . lactulose  20 g Oral TID  . montelukast  10 mg Oral Daily  . pantoprazole  40 mg Oral Daily  . polyethylene glycol  17 g Oral Daily  . sodium bicarbonate  650 mg Oral BID    Continuous Infusions: . sodium chloride Stopped (03/26/20 1344)    PRN Meds: sodium chloride, bisacodyl, diphenhydrAMINE, HYDROmorphone, ipratropium-albuterol, ondansetron (ZOFRAN) IV, senna-docusate  Physical Exam         Mild to moderate distress Restless Increased work of breathing Abdomen distended Some edema Appears icteric  Vital Signs: BP 112/67 (BP Location: Left Arm)   Pulse 90   Temp 98 F (36.7 C) (Oral)   Resp 15   Ht 6' 3.5" (1.918 m)   Wt 106.5 kg   SpO2 96%   BMI 28.96 kg/m  SpO2: SpO2: 96 % O2 Device: O2 Device: Room Air O2 Flow Rate: O2 Flow Rate (L/min): 2 L/min  Intake/output summary:   Intake/Output Summary (Last 24 hours) at 03/28/2020 1559 Last data filed at 03/28/2020 1111 Gross per 24 hour  Intake 360 ml  Output 250 ml  Net 110 ml   LBM: Last BM Date: 03/27/20 Baseline Weight: Weight: 107.5 kg Most recent weight: Weight: 106.5 kg       Palliative  Assessment/Data: PPS 30%   Flowsheet Rows     Most Recent Value  Intake Tab  Referral Department Hospitalist  Unit at Time of Referral Med/Surg Unit  Palliative Care Primary Diagnosis Cancer  Date Notified 03/26/20  Palliative Care Type New Palliative care  Reason for referral Clarify Goals of Care, Non-pain Symptom, Pain  Date of Admission 03/23/20  Date first seen by Palliative Care 03/27/20  #  of days Palliative referral response time 1 Day(s)  # of days IP prior to Palliative referral 3  Clinical Assessment  Palliative Performance Scale Score 50%  Psychosocial & Spiritual Assessment  Palliative Care Outcomes  Patient/Family meeting held? Yes  Who was at the meeting? Patient, daughter, wife via phone  Palliative Care Outcomes Clarified goals of care, Improved non-pain symptom therapy, Improved pain interventions      Patient Active Problem List   Diagnosis Date Noted  . Liver metastases ([place])   . Hyperbilirubinemia 03/23/2020  . History of CVA (cerebrovascular accident) 03/23/2020  . Hyponatremia 03/23/2020  . Transaminitis 03/23/2020  . Liver lesion 03/23/2020  . CAD (coronary artery disease) 05/05/2019  . NSTEMI (non-ST elevated myocardial infarction) ([place]) 05/03/2019  . Pulmonary nodules/lesions, multiple 04/29/2019  . Right carpal tunnel syndrome 10/15/2018  . Left inguinal hernia 10/15/2018  . Cerebrovascular accident (CVA) ([place])   . Hyperlipidemia 01/20/2018  . BPH (benign prostatic hyperplasia) 01/20/2018  . COPD (chronic obstructive pulmonary disease) ([place]) 01/20/2018  . Cerebral infarction due to embolism of left middle cerebral artery ([place]) s/p tPA 01/19/2018  . Suspected stroke patient last known to be well 2 to 3 hours ago 01/18/2018  . Primary osteoarthritis of right knee 05/30/2016  . Right knee DJD 05/30/2016  . GERD (gastroesophageal reflux disease) 07/28/2014  . Left knee DJD 07/21/2014  . Osteoarthritis 10/09/2009  . Asthma 08/17/2008  .  SINUSITIS 09/15/2007  . Essential hypertension 06/19/2007  . Allergic rhinitis 06/19/2007    Palliative Care Assessment & Plan   Patient Profile:  68 y/o male with metastatic adenocarcinoma of unknown primary, suspicious for cholangiocarcinoma vs. other GI tract source, extensive involvement of the liver causing severe liver dysfunction / failure which has progressed, now worsening renal function and hepatic encephalopathy  Assessment:  generalized pain Generalized weakness Functional decline.  Recommendations/Plan:  Discussed about hospice level of care, difference between home with hospice versus residential hospice discussed in detail.   Plan:  Residential hospice. TOC consulted.      Code Status:    Code Status Orders  (From admission, onward)         Start     Ordered   03/28/20 1407  Do not attempt resuscitation (DNR)  Continuous       Question Answer Comment  In the event of cardiac or respiratory ARREST Do not call a "code blue"   In the event of cardiac or respiratory ARREST Do not perform Intubation, CPR, defibrillation or ACLS   In the event of cardiac or respiratory ARREST Use medication by any route, position, wound care, and other measures to relive pain and suffering. May use oxygen, suction and manual treatment of airway obstruction as needed for comfort.      03/28/20 1407        Code Status History    Date Active Date Inactive Code Status Order ID Comments User Context   03/23/2020 2127 03/28/2020 1407 Full Code [id number]  Orene Desanctis, DO ED   05/03/2019 2116 05/05/2019 1605 Full Code [id number]  Abigail Butts., PA-C Inpatient   01/19/2018 0219 01/20/2018 1718 Full Code [id number]  Aroor, Lanice Schwab, MD Inpatient   05/30/2016 1125 06/01/2016 1754 Full Code [id number]  Susa Day, MD Inpatient   07/21/2014 1214 07/25/2014 1711 Full Code [id number]  Johnn Hai, MD Inpatient   Advance Care Planning Activity    Advance Directive Documentation      Most Recent Value  Type of Advance Directive Healthcare  Power of Attorney, Living will  Pre-existing out of facility DNR order (yellow form or pink MOST form) --  "MOST" Form in Place? --       Prognosis:   < 2 weeks  Discharge Planning:  Hospice facility  Care plan was discussed with Patient's wife   Thank you for allowing the Palliative Medicine Team to assist in the care of this patient.   Time In: 1500 Time Out: 1525 Total Time 25 Prolonged Time Billed No       Greater than 50%  of this time was spent counseling and coordinating care related to the above assessment and plan.  Loistine Chance, MD  Please contact Palliative Medicine Team phone at [phone] for questions and concerns.

## 2020-03-28 NOTE — Consult Note (Signed)
Palliative care consult note  Reason for consult: Goals of care/symptom management in light of metastatic disease of unknown primary with significant liver metastasis  Part of care consult received.  Chart reviewed including personal review of pertinent labs and imaging.  Briefly, Charles Mccoy is a pleasant 69 year old male with past medical history of COPD, CVA, CAD status post stent, HLD, HTN, recent finding of liver lesions who was sent to the hospital by oncology for transaminitis and hyperbilirubinemia.  Labs on admission revealed lactic acidosis, hyponatremia, elevated creatinine and transaminitis with elevated bilirubin.  CT of abdomen pelvis showed ascites and multifocal liver lesions.  Biopsy was performed and results are pending. GI was consulted and MRCP performed today.  Palliative care consulted for ongoing discussion regarding goals of care as more information becomes available as well as recommendations for symptom management.  I met today with Charles Mccoy in conjunction with his daughter, Estill Bamberg, his wife via phone, and Dr. Havery Moros.  My initial conversation was with Charles Mccoy and his daughter and largely focused on symptom management.  As I was leaving the room, however, Dr. Havery Moros was coming to share results of MRCP and I joined them for conversation as well.  His wife also joined via phone for this conversation as well.  My initial conversation prior to Dr. Havery Moros centered around symptom management and also initial conversation regarding long-term goals.  We predominately talked about pain control and working to balance benefit of pain relief with concern that opioids have many side effects.  He hopes to minimize the amount of medication that may contribute to unwanted effects such as constipation and nausea.  He is also interested in complementary interventions such as essential and cannabis oils.  We discussed current pain management regimen as well as options for opioid therapies  moving forward.  His daughter then noted that he was having worsening itching and had developed more of a rash, predominantly on his back and posterior left forearm.  He was noted to have developed a rash yesterday after putting on a particular gown in the hospital, but he does not recall exactly what the rash looks like nor where it was located.  Neither his daughter nor any other staff were here at that time either to see if this is the same or different type of rash.  We did give him his currently ordered IV Benadryl and this did seem to improve.  I did reach out to Dr. [place] Pink and we discussed plan to continue to observe after getting Benadryl and RN to reach out to on-call team if this worsens tonight or if he has any other systemic symptoms that may be indicative of a more serious event (trouble breathing, vital sign changes, etc.)  We also had initial conversations regarding goals of care.  At this point, his goal is to wait for input by oncology to have a better idea exactly what his options are in terms of disease modifying therapy and also understanding prognosis.  I then ran into Dr. Havery Moros as he was going to share results of MRCP.  I joined him for a family meeting with patient, his daughter, and his wife via phone.   When I returned with Dr. Havery Moros, we discussed that he has significant disease burden in his liver, and unfortunately, he is not a candidate for intervention such as ERCP/biliary drainage.  Discussed that CBD and hepatic ducts are normal diameter and patent, but there is peripheral ductal distention due to mass-effect.  Review of  images reveales nothing that appears to be amenable to external drainage.  Dr. Havery Moros discussed the implications of this and severity of his situation with progressive jaundice and coagulopathy.  Dr. Havery Moros also explained why treatment options are limited and why he is not a candidate for liver transplant.  We further clarified that biopsy  results and oncology input would help determine next steps for potential disease modifying therapy and I would ask another member of the palliative medicine team to follow-up once biopsy results are available.  -Full code/full scope treatment.  Initial discussions today in conjunction with Dr. Havery Moros regarding the severity of his illness and concern that he is not a candidate for GI interventions. Also discussed concern that oncologic interventions may be limited secondary to hepatic disease and limited functional status.  Plan is to await biopsy results and further input by oncology to help determine what interventions are most likely to help Charles Mccoy live as well as possible for as long as possible.  We discussed a little today that this may be through oncology interventions such as chemotherapy, radiation, or immunotherapy or it may be that the best way to add as much time and quality to his life as possible is through aggressive symptom management and best supportive care to preserve his functional status for as long as possible.  Obviously, he wants more information about nature of his illness prior to making decisions, but he and family are both open and appreciative to conversation regarding next steps moving forward. -His wife would be his surrogate decision maker in the event he cannot make his own medical decisions.  -He is having a lot of itching today.  This is likely multifactorial and he does have significant jaundice and also had allergic reaction to gown yesterday.  I did stop his scheduled tramadol today in case this may be contributing.  He did have a rash covering his back as well as arms (most prominent on posterior left forearm).  This appeared to decrease some following IV Benadryl. -Pain: Cancer related.  Current regimen is scheduled tramadol as well as as needed oxycodone.  I talked with him and his daughter at length about pain management and cancer pain.  He and family are  concerned about being on narcotic pain medication and side effects associated with this.  We discussed utilization of pain medication to improve his quality of life and talked about options for pain medication moving forward.  Due to concern for contribution to the itching, I did stop scheduled tramadol.  I made addition of oral Dilaudid 2 mg every 4 hours as needed for breakthrough pain as I believe this to be less likely to add to component of itching with his hepatic failure. -He and his family also utilize complementary interventions in his therapy such as essential oils and full spectrum cannabis oil.  I discussed this today with his daughter as well.  It is not a case where the expectation is that complementary medicines are going to "cure" Charles Mccoy.  Family is very appropriate with their expectations with complimentary interventions and are utilizing them as an adjunct to his other medical care.  Their goal with these therapies is to help cleanse liver toxins, relieve pain, and help with nausea.  I discussed that we would continue to support any interventions that are important to him and may help him feel better as long as there are not known risks/interactions with other medications/interventions that are ordered. -I will ask another member the  palliative medicine team to follow-up once biopsy results are available.  His daughter has my card and will call if there are further questions in the interim.  Please reach out if there are other specific palliative care needs with which we can be of assistance.  Start time: 1500 End time: 1720 Total time: 80 minutes  Greater than 50%  of this time was spent counseling and coordinating care related to the above assessment and plan.  Micheline Rough, MD [facility] Team [phone]

## 2020-03-28 NOTE — Progress Notes (Signed)
Progress Note   Subjective  Patient lethargic. Pathology results returned, liver function worsening   Objective   Vital signs in last 24 hours: Temp:  [97.6 F (36.4 C)-98.4 F (36.9 C)] 98 F (36.7 C) (06/22 1042) Pulse Rate:  [84-90] 90 (06/22 1042) Resp:  [15] 15 (06/22 0549) BP: (105-117)/(67-74) 112/67 (06/22 1042) SpO2:  [93 %-96 %] 96 % (06/22 1042) Weight:  [106.5 kg] 106.5 kg (06/22 1100) Last BM Date: 03/27/20 General:    white male lying in bed, NAD, jaundiced   Intake/Output from previous day: 06/21 0701 - 06/22 0700 In: 360 [P.O.:360] Out: 250 [Urine:250] Intake/Output this shift: No intake/output data recorded.  Lab Results: Recent Labs    03/26/20 0308 03/27/20 0316 03/28/20 0309  WBC 19.6* 23.3* 22.1*  HGB 12.9* 13.5 14.6  HCT 37.8* 39.4 42.4  PLT 152 146* 141*   BMET Recent Labs    03/27/20 0316 03/27/20 0316 03/27/20 1409 03/27/20 2023 03/28/20 0309  NA 124*  --  126*  --  124*  K 5.6*   < > 5.6* 5.0 5.4*  CL 89*  --  89*  --  86*  CO2 22  --  18*  --  20*  GLUCOSE 96  --  121*  --  117*  BUN 54*  --  64*  --  73*  CREATININE 1.53*  --  1.75*  --  2.05*  CALCIUM 7.5*  --  7.9*  --  7.4*   < > = values in this interval not displayed.   LFT Recent Labs    03/28/20 0309  PROT 5.8*  ALBUMIN 2.0*  AST 212*  ALT 249*  ALKPHOS 658*  BILITOT 12.8*   PT/INR Recent Labs    03/27/20 1005 03/28/20 0309  LABPROT 19.1* 21.1*  INR 1.7* 1.9*    Studies/Results: MR 3D Recon At Scanner  Result Date: 03/27/2020 CLINICAL DATA:  Jaundice extensive liver metastases from unknown primary EXAM: MRI ABDOMEN WITHOUT AND WITH CONTRAST (INCLUDING MRCP) TECHNIQUE: Multiplanar multisequence MR imaging of the abdomen was performed both before and after the administration of intravenous contrast. Heavily T2-weighted images of the biliary and pancreatic ducts were obtained, and three-dimensional MRCP images were rendered by post processing.  CONTRAST:  31mL GADAVIST GADOBUTROL 1 MMOL/ML IV SOLN COMPARISON:  CT abdomen and pelvis of March 23, 2020 FINDINGS: Lower chest: RIGHT-sided pleural effusion similar to previous evaluation. Is basilar airspace disease. Similar to prior study. Mediastinal adenopathy better displayed on a recent CT angiogram of the chest. Hepatobiliary: Marked worsening of hepatic metastatic disease as on the recent CT of the abdomen and pelvis near confluent in the LEFT hepatic lobe. Gallbladder is decompressed otherwise unremarkable. Areas of segmental biliary ductal distension in the RIGHT hepatic lobe seen to anterior inferior RIGHT hemi liver and anterior superior RIGHT hemi liver likely due to local mass effect. Common duct is nondilated as are RIGHT and LEFT hepatic ducts. Gallbladder is collapsed upon itself likely accounting for the appearance on the previous imaging study. Portal vein is patent. Pancreas: No mass, inflammatory changes, or other parenchymal abnormality identified. Spleen:  Normal size without suspicious focal lesion. Adrenals/Urinary Tract: Adrenal glands are normal. LEFT renal cyst. No hydronephrosis. Stomach/Bowel: Limited assessment of the gastrointestinal tract with moderate colonic distension in the upper abdomen nearly 7 cm. Vascular/Lymphatic: Atheromatous plaque of the abdominal aorta, no aneurysmal dilation. Adenopathy in the retroperitoneum and upper abdomen is similar to the previous study. Other:  Ascites better  demonstrated on CT of the abdomen and pelvis. Musculoskeletal: No suspicious bone lesions identified. IMPRESSION: 1. Marked worsening of hepatic metastatic disease as on the recent CT of the abdomen and pelvis near confluent in the LEFT hepatic lobe. 2. Areas of segmental biliary ductal distension in the RIGHT hepatic lobe seen to anterior inferior RIGHT hemi liver and anterior superior RIGHT hemi liver likely due to local mass effect. Scattered small peripheral biliary radicles throughout  the liver in other areas with less dilation, essentially mild peripheral dilation elsewhere. This is likely due to local areas of mass effect in the setting of worsening of hepatic metastatic disease which may also explain the degree of hepatic dysfunction. 3. Collapsed gallbladder 4. Common duct is nondilated as are RIGHT and LEFT hepatic ducts. 5. Adenopathy in the retroperitoneum and upper abdomen is similar to the previous study. Also on some series adenopathy in the chest is partially imaged, better seen on recent chest imaging from May of 2021 but likely enlarged since that time. 6. Limited assessment of the gastrointestinal tract with moderate colonic distension in the upper abdomen nearly 7 cm. Correlate with any clinical signs of developing colonic ileus. 7. RIGHT-sided pleural effusion similar to previous evaluation. 8. Aortic atherosclerosis. Aortic Atherosclerosis (ICD10-I70.0). Electronically Signed   By: Zetta Bills M.D.   On: 03/27/2020 15:35   MR ABDOMEN MRCP W WO CONTAST  Result Date: 03/27/2020 CLINICAL DATA:  Jaundice extensive liver metastases from unknown primary EXAM: MRI ABDOMEN WITHOUT AND WITH CONTRAST (INCLUDING MRCP) TECHNIQUE: Multiplanar multisequence MR imaging of the abdomen was performed both before and after the administration of intravenous contrast. Heavily T2-weighted images of the biliary and pancreatic ducts were obtained, and three-dimensional MRCP images were rendered by post processing. CONTRAST:  11mL GADAVIST GADOBUTROL 1 MMOL/ML IV SOLN COMPARISON:  CT abdomen and pelvis of March 23, 2020 FINDINGS: Lower chest: RIGHT-sided pleural effusion similar to previous evaluation. Is basilar airspace disease. Similar to prior study. Mediastinal adenopathy better displayed on a recent CT angiogram of the chest. Hepatobiliary: Marked worsening of hepatic metastatic disease as on the recent CT of the abdomen and pelvis near confluent in the LEFT hepatic lobe. Gallbladder is  decompressed otherwise unremarkable. Areas of segmental biliary ductal distension in the RIGHT hepatic lobe seen to anterior inferior RIGHT hemi liver and anterior superior RIGHT hemi liver likely due to local mass effect. Common duct is nondilated as are RIGHT and LEFT hepatic ducts. Gallbladder is collapsed upon itself likely accounting for the appearance on the previous imaging study. Portal vein is patent. Pancreas: No mass, inflammatory changes, or other parenchymal abnormality identified. Spleen:  Normal size without suspicious focal lesion. Adrenals/Urinary Tract: Adrenal glands are normal. LEFT renal cyst. No hydronephrosis. Stomach/Bowel: Limited assessment of the gastrointestinal tract with moderate colonic distension in the upper abdomen nearly 7 cm. Vascular/Lymphatic: Atheromatous plaque of the abdominal aorta, no aneurysmal dilation. Adenopathy in the retroperitoneum and upper abdomen is similar to the previous study. Other:  Ascites better demonstrated on CT of the abdomen and pelvis. Musculoskeletal: No suspicious bone lesions identified. IMPRESSION: 1. Marked worsening of hepatic metastatic disease as on the recent CT of the abdomen and pelvis near confluent in the LEFT hepatic lobe. 2. Areas of segmental biliary ductal distension in the RIGHT hepatic lobe seen to anterior inferior RIGHT hemi liver and anterior superior RIGHT hemi liver likely due to local mass effect. Scattered small peripheral biliary radicles throughout the liver in other areas with less dilation, essentially mild peripheral dilation  elsewhere. This is likely due to local areas of mass effect in the setting of worsening of hepatic metastatic disease which may also explain the degree of hepatic dysfunction. 3. Collapsed gallbladder 4. Common duct is nondilated as are RIGHT and LEFT hepatic ducts. 5. Adenopathy in the retroperitoneum and upper abdomen is similar to the previous study. Also on some series adenopathy in the chest is  partially imaged, better seen on recent chest imaging from May of 2021 but likely enlarged since that time. 6. Limited assessment of the gastrointestinal tract with moderate colonic distension in the upper abdomen nearly 7 cm. Correlate with any clinical signs of developing colonic ileus. 7. RIGHT-sided pleural effusion similar to previous evaluation. 8. Aortic atherosclerosis. Aortic Atherosclerosis (ICD10-I70.0). Electronically Signed   By: Zetta Bills M.D.   On: 03/27/2020 15:35   CUP PACEART REMOTE DEVICE CHECK  Result Date: 03/27/2020 [facility] summary report received. Battery status OK. Normal device function. No new symptom episodes, tachy episodes, brady, or pause episodes. No new AF episodes. Monthly summary reports and ROV/PRN. Felisa Bonier, RN, MSN      Assessment / Plan:   69 y/o male with metastatic adenocarcinoma of unknown primary, suspicious for cholangiocarcinoma vs. other GI tract source, extensive involvement of the liver causing severe liver dysfunction / failure which has progressed, now worsening renal function and hepatic encephalopathy. Unfortunately MRCP did not show any area that could be intervened upon by ERCP or IR for drainage to provide any benefit. His functional status is quite poor, given his worsening liver failure, he does not appear to be a candidate for chemotherapy. Family has spoken with Dr. Benay Spice and opting or hospice at this time which is appropriate. Discussed the course of the patient with the family, answered their questions. Please call with any further questions moving forward.  [place] Cellar, MD [facility]

## 2020-03-28 NOTE — Plan of Care (Signed)
  Problem: Education: Goal: Knowledge of General Education information will improve Description: Including pain rating scale, medication(s)/side effects and non-pharmacologic comfort measures 03/28/2020 0455 by Sheilah Mins, RN Outcome: Progressing 03/27/2020 2038 by Sheilah Mins, RN Outcome: Progressing   Problem: Pain Managment: Goal: General experience of comfort will improve 03/28/2020 0455 by Sheilah Mins, RN Outcome: Progressing 03/27/2020 2038 by Sheilah Mins, RN Outcome: Progressing

## 2020-03-28 NOTE — Progress Notes (Signed)
Carelink Summary Report / Loop Recorder 

## 2020-03-28 NOTE — Evaluation (Signed)
Physical Therapy Evaluation Patient Details Name: Charles Mccoy MRN: [id number] DOB: 15-Oct-1950 Today's Date: 03/28/2020   History of Present Illness  69 year old with history of COPD, CVA, CAD status post stent, HLD, HTN, recent finding of liver lesions sent to the hospital by oncology for transaminitis and hyperbilirubinemia.  Upon admission had lactic acidosis, hyponatremia, 1.66, BUN 45, transaminitis, elevated total bilirubin.  CT abdomen pelvis showed ascites, multifocal liver lesions.  Clinical Impression  Pt admitted with above diagnosis.  Pt currently with functional limitations due to the deficits listed below (see PT Problem List). Pt will benefit from skilled PT to increase their independence and safety with mobility to allow discharge to the venue listed below.  Pt agreeable to attempt mobility however only remained in room and fatigued very quickly.  Pt presents with limited endurance and generalized weakness.  Pt reports 16 steps to enter home and bedroom upstairs (uncertain of accuracy).  Pt is not able to tolerate stairs at this time.  Recommend SNF if family is unable to provide assist at home.  If home, pt will likely need hospital bed and possibly w/c.  Palliative care also following pt.       Follow Up Recommendations SNF;Supervision/Assistance - 24 hour    Equipment Recommendations  Wheelchair cushion (measurements PT);Wheelchair (measurements PT);Hospital bed    Recommendations for Other Services       Precautions / Restrictions Precautions Precautions: Fall      Mobility  Bed Mobility Overal bed mobility: Needs Assistance Bed Mobility: Supine to Sit;Sit to Supine     Supine to sit: Min guard Sit to supine: Mod assist   General bed mobility comments: assist for LEs onto bed due to fatigue  Transfers Overall transfer level: Needs assistance Equipment used: Rolling walker (2 wheeled) Transfers: Sit to/from Stand Sit to Stand: Min assist         General  transfer comment: assist to rise and steady, cues for hand placement; performed x3  Ambulation/Gait Ambulation/Gait assistance: Min assist Gait Distance (Feet): 3 Feet Assistive device: Rolling walker (2 wheeled) Gait Pattern/deviations: Step-through pattern;Decreased stride length;Narrow base of support;Trunk flexed     General Gait Details: pt attempted a few steps and assisted to sitting on window bench due to weakness and fatigue  Stairs            Wheelchair Mobility    Modified Rankin (Stroke Patients Only)       Balance Overall balance assessment: Needs assistance         Standing balance support: Bilateral upper extremity supported Standing balance-Leahy Scale: Poor Standing balance comment: reliant on UE support                             Pertinent Vitals/Pain Pain Assessment: No/denies pain    Home Living Family/patient expects to be discharged to:: Private residence Living Arrangements: Spouse/significant other Available Help at Discharge: Family;Available PRN/intermittently Type of Home: House Home Access: Stairs to enter   Entrance Stairs-Number of Steps: 16? Home Layout: Bed/bath upstairs;Two level Home Equipment: Walker - 2 wheels      Prior Function Level of Independence: Independent               Hand Dominance        Extremity/Trunk Assessment   Upper Extremity Assessment Upper Extremity Assessment: Generalized weakness    Lower Extremity Assessment Lower Extremity Assessment: Generalized weakness    Cervical / Trunk Assessment Cervical / Trunk  Assessment: Normal  Communication   Communication: Other (comment) (somnolent today)  Cognition Arousal/Alertness: Lethargic Behavior During Therapy: Flat affect Overall Cognitive Status: No family/caregiver present to determine baseline cognitive functioning                                 General Comments: very slow to mobilize, little  verbalizations, does follow commands with increased time (likely due more to fatigue/weakness)      General Comments      Exercises     Assessment/Plan    PT Assessment Patient needs continued PT services  PT Problem List Decreased balance;Decreased activity tolerance;Decreased strength;Decreased mobility;Decreased knowledge of use of DME;Decreased coordination       PT Treatment Interventions DME instruction;Therapeutic exercise;Gait training;Balance training;Functional mobility training;Therapeutic activities;Patient/family education;Neuromuscular re-education    PT Goals (Current goals can be found in the Care Plan section)  Acute Rehab PT Goals PT Goal Formulation: With patient Time For Goal Achievement: 04/11/20 Potential to Achieve Goals: Fair    Frequency Min 2X/week   Barriers to discharge        Co-evaluation               AM-PAC PT "6 Clicks" Mobility  Outcome Measure Help needed turning from your back to your side while in a flat bed without using bedrails?: A Little Help needed moving from lying on your back to sitting on the side of a flat bed without using bedrails?: A Little Help needed moving to and from a bed to a chair (including a wheelchair)?: A Little Help needed standing up from a chair using your arms (e.g., wheelchair or bedside chair)?: A Little Help needed to walk in hospital room?: A Lot Help needed climbing 3-5 steps with a railing? : A Lot 6 Click Score: 16    End of Session Equipment Utilized During Treatment: Gait belt Activity Tolerance: Patient limited by fatigue Patient left: in bed;with nursing/sitter in room;with call bell/phone within reach;with bed alarm set Nurse Communication: Mobility status PT Visit Diagnosis: Other abnormalities of gait and mobility (R26.89)    Time: 4665-9935 PT Time Calculation (min) (ACUTE ONLY): 16 min   Charges:   PT Evaluation $PT Eval Low Complexity: 1 Low        Kati PT, DPT Acute  Rehabilitation Services Pager: [phone] Office: [phone]  Remi Lopata,KATHrine E 03/28/2020, 11:22 AM

## 2020-03-28 NOTE — Progress Notes (Addendum)
HEMATOLOGY-ONCOLOGY PROGRESS NOTE  SUBJECTIVE: Somewhat lethargic this morning but does awaken and answers questions.  He denies any pain.  Denies any nausea, vomiting, diarrhea.  PHYSICAL EXAMINATION:  Vitals:   03/28/20 0549 03/28/20 0752  BP: 105/69   Pulse: 84   Resp: 15   Temp: 97.6 F (36.4 C)   SpO2: 94% 93%   Filed Weights   03/23/20 1611 03/24/20 0325  Weight: 107.5 kg 107.5 kg    Intake/Output from previous day: 06/21 0701 - 06/22 0700 In: 360 [P.O.:360] Out: 250 [Urine:250]  GENERAL: Chronically ill-appearing, no distress EYES: Scleral icterus noted OROPHARYNX: Oral mucosa dry, no thrush or mucositis HEART: regular rate & rhythm and no murmurs, 1+ lower extremity edema bilaterally ABDOMEN: Positive bowel sounds, soft, reports tenderness with palpation over the right upper quadrant NEURO: Alert and oriented, somewhat somnolent at times but able answer questions  LABORATORY DATA:  I have reviewed the data as listed CMP Latest Ref Rng & Units 03/28/2020 03/27/2020 03/27/2020  Glucose 70 - 99 mg/dL 117(H) - 121(H)  BUN 8 - 23 mg/dL 73(H) - 64(H)  Creatinine 0.61 - 1.24 mg/dL 2.05(H) - 1.75(H)  Sodium 135 - 145 mmol/L 124(L) - 126(L)  Potassium 3.5 - 5.1 mmol/L 5.4(H) 5.0 5.6(H)  Chloride 98 - 111 mmol/L 86(L) - 89(L)  CO2 22 - 32 mmol/L 20(L) - 18(L)  Calcium 8.9 - 10.3 mg/dL 7.4(L) - 7.9(L)  Total Protein 6.5 - 8.1 g/dL 5.8(L) - -  Total Bilirubin 0.3 - 1.2 mg/dL 12.8(H) - -  Alkaline Phos 38 - 126 U/L 658(H) - -  AST 15 - 41 U/L 212(H) - -  ALT 0 - 44 U/L 249(H) - -    Lab Results  Component Value Date   WBC 22.1 (H) 03/28/2020   HGB 14.6 03/28/2020   HCT 42.4 03/28/2020   MCV 76.1 (L) 03/28/2020   PLT 141 (L) 03/28/2020   NEUTROABS 14.9 (H) 03/23/2020    DG Chest 2 View  Result Date: 03/20/2020 CLINICAL DATA:  Chest pain and shortness of breath EXAM: CHEST - 2 VIEW COMPARISON:  Mar 01, 2016 FINDINGS: There is bibasilar atelectatic change. The lungs  elsewhere are clear. The heart size and pulmonary vascularity are normal. No adenopathy. There is aortic atherosclerosis. Loop recorder present on the left anteriorly. There is degenerative change in the thoracic spine. IMPRESSION: Bibasilar atelectasis. No edema or airspace opacity. Stable cardiac silhouette. Aortic Atherosclerosis (ICD10-I70.0). Electronically Signed   By: Lowella Grip III M.D.   On: 03/20/2020 13:11   DG Chest 2 View  Result Date: 03/01/2020 CLINICAL DATA:  Chest pain EXAM: CHEST - 2 VIEW COMPARISON:  May 03, 2019 FINDINGS: There is mild bibasilar atelectasis. Lungs otherwise are clear. The heart size and pulmonary vascularity are normal. No adenopathy. There are foci left anterior descending coronary artery calcification. There is aortic atherosclerosis. There is degenerative change in the thoracic spine. There is a loop recorder on the left anteriorly. IMPRESSION: Bibasilar atelectasis.  Lungs otherwise clear.  Heart size normal. Coronary artery calcification noted. Aortic Atherosclerosis (ICD10-I70.0). Loop recorder on left anteriorly. Electronically Signed   By: Lowella Grip III M.D.   On: 03/01/2020 10:23   CT Angio Chest PE W/Cm &/Or Wo Cm  Result Date: 03/01/2020 CLINICAL DATA:  Shortness of breath. Concern for pulmonary embolism. Positive D-dimer. EXAM: CT ANGIOGRAPHY CHEST WITH CONTRAST TECHNIQUE: Multidetector CT imaging of the chest was performed using the standard protocol during bolus administration of intravenous contrast. Multiplanar CT  image reconstructions and MIPs were obtained to evaluate the vascular anatomy. CONTRAST:  26mL OMNIPAQUE IOHEXOL 350 MG/ML SOLN COMPARISON:  None. FINDINGS: Cardiovascular: Contrast injection is sufficient to demonstrate satisfactory opacification of the pulmonary arteries to the segmental level. There is no pulmonary embolus. The main pulmonary artery is within normal limits for size. There is no CT evidence of acute right heart  strain. There are atherosclerotic changes of the visualized thoracic aorta. The thoracic aorta is borderline aneurysmal measuring approximately 4 cm in diameter. Heart size is coronary artery calcifications are noted. Mediastinum/Nodes: --there are mildly enlarged mediastinal lymph nodes. For example there is a right paratracheal lymph node measuring approximately 1.5 cm (axial series 5, image 52). --No axillary lymphadenopathy. --No supraclavicular lymphadenopathy. --Normal thyroid gland. --The esophagus is unremarkable Lungs/Pleura: There is no pneumothorax. Atelectasis is noted at the lung bases. There is no significant pleural effusion. There is a nodule along the major fissure on the left favored to represent a small lymph node. Upper Abdomen: There are innumerable low-attenuation masses throughout the patient's liver. There are few mildly enlarged lymph nodes in the upper abdomen. Musculoskeletal: No chest wall abnormality. No acute or significant osseous findings. Review of the MIP images confirms the above findings. IMPRESSION: 1. No acute pulmonary embolism. 2. There are innumerable masses in the patient's liver concerning for metastatic disease, currently with an unknown primary. 3. There are mildly enlarged mediastinal lymph nodes which may be reactive or metastatic. Attention on follow-up examinations is recommended. 4. Bibasilar atelectasis is noted. Aortic Atherosclerosis (ICD10-I70.0). Electronically Signed   By: Katherine Mantle M.D.   On: 03/01/2020 19:03   CT ABDOMEN PELVIS W CONTRAST  Result Date: 03/23/2020 CLINICAL DATA:  Abdominal distension, abdominal pain with elevated LFTs with known metastatic disease to the liver. EXAM: CT ABDOMEN AND PELVIS WITH CONTRAST TECHNIQUE: Multidetector CT imaging of the abdomen and pelvis was performed using the standard protocol following bolus administration of intravenous contrast. CONTRAST:  OMNIPAQUE IOHEXOL 300 MG/ML  SOLN COMPARISON:   03/01/2020 FINDINGS: Lower chest: Interval development of RIGHT-sided effusion and basilar airspace disease since the prior study. Calcified coronary artery disease. No pericardial effusion. Heart is incompletely imaged. Minimal atelectasis at the LEFT lung base. Hepatobiliary: Multifocal hepatic metastatic lesions now more confluent and enlarging when compared to the prior study. (Image 26, series 2) 3.5 cm lesion previously 2.9 cm. Near confluent disease in the LEFT hepatic lobe on today's exam. Area of confluent disease in the RIGHT hepatic lobe (image 39, series 2) 5.0 cm at most 3.7 cm on the prior study and without discrete intervening normal hepatic parenchyma between lesions in this area. Portal vein is patent. Gallbladder is collapsed. Question of lack of enhancement of the medial wall of the gallbladder. In the current context the significance is uncertain the gallbladder could be collapse upon itself. Signs of ascites and small pericholecystic fluid. Portal vein is patent. Pancreas: Pancreas is normal without ductal dilation or inflammation. Spleen: Spleen normal size and contour. Adrenals/Urinary Tract: Adrenal glands are normal. Symmetric renal enhancement. No hydronephrosis. LEFT renal cyst arises from lateral LEFT kidney as before. Urinary bladder is normal to the extent evaluated aside from LEFT posterolateral bladder diverticulum. Stomach/Bowel: Stomach is under distended. Small bowel without overt signs of obstruction. Mildly dilated distal small bowel loops filled with liquid stool like material. Stool and gas filling the proximal colon. Colonic diverticulosis. No diverticulitis. The appendix is normal. Vascular/Lymphatic: Calcific and noncalcific atheromatous plaque of the abdominal aorta. Mild dilation without  aneurysm. 2.9 cm greatest axial dimension. Retroperitoneal adenopathy. (Image 46, series 2) 1.3 cm previously 1 cm short axis. Periportal nodal enlargement is similar. Juxta crural lymph  node and hepatic gastric lymph nodes are similar. Reproductive: Prostate is unremarkable by CT. Other: Post LEFT inguinal herniorrhaphy. Ascites which is new from the prior study. No signs of free air. Musculoskeletal: Spinal degenerative changes. No acute or destructive bone process. IMPRESSION: 1. Given new ascites and lack of gallbladder wall enhancement with collapse of the gallbladder since previous imaging studies would suggest focused assessment with gallbladder ultrasound to ensure gallbladder wall integrity. HIDA scan is likely to be un informative at this time due to hepatic dysfunction. 2. Multifocal hepatic metastatic lesions now more confluent and enlarging when compared to the prior study. 3. New small volume ascites is more likely related to worsening hepatic dysfunction in the setting of extensive hepatic metastatic disease. 4. Interval development of RIGHT-sided effusion and basilar airspace disease since the prior study. 5. Colonic diverticulosis without diverticulitis. 6. Aortic atherosclerosis. Aortic Atherosclerosis (ICD10-I70.0). Electronically Signed   By: Zetta Bills M.D.   On: 03/23/2020 20:17   CT ABDOMEN PELVIS W CONTRAST  Result Date: 03/01/2020 CLINICAL DATA:  Chest CTA with findings suspicious for hepatic metastatic disease. EXAM: CT ABDOMEN AND PELVIS WITH CONTRAST TECHNIQUE: Multidetector CT imaging of the abdomen and pelvis was performed using the standard protocol following bolus administration of intravenous contrast. CONTRAST:  35m OMNIPAQUE IOHEXOL 300 MG/ML  SOLN COMPARISON:  Chest CTA earlier today. Abdominopelvic CT 08/17/2018 FINDINGS: Lower chest: Assessed on chest CT earlier today. Right greater than left basilar atelectasis. Pleural thickening without significant effusion. Hepatobiliary: Innumerable low-density lesions scattered throughout the liver parenchyma most consistent with diffuse hepatic metastatic disease. Majority of the lesions are 1-2 cm. Largest  lesion in the right hepatic dome measures 2.4 cm. Gallbladder physiologically distended, no calcified stone. No biliary dilatation. Pancreas: No ductal dilatation or inflammation. No evidence of pancreatic mass. Spleen: Normal in size without focal abnormality. Adrenals/Urinary Tract: Mild thickening of the lateral left adrenal gland is unchanged from prior exam. The right adrenal gland is normal. There is no suspicious adrenal nodule. No hydronephrosis. There is symmetric bilateral perinephric edema. There is a 3.3 cm exophytic low-density lesion from the posterior left kidney that likely represents a minimally complex cyst. This is not significantly changed in size from prior exam. Excreted IV contrast within both renal collecting systems which limits assessment for stone. Probable parapelvic cyst in the left kidney. There is excreted IV contrast within the urinary bladder without evidence of bladder wall thickening or focal lesion. Stomach/Bowel: Detailed bowel evaluation is limited in the absence of enteric contrast. Mild wall thickening of the distal esophagus. Unopacified stomach is unremarkable. Normal positioning of the ligament of Treitz. Small duodenal diverticulum. No small bowel inflammation or obstruction. No obvious small bowel mass. Normal appendix. Moderate volume of stool throughout the colon. Diverticulosis from the splenic flexure distally. No diverticulitis. Few areas of nondistention involving the sigmoid colon likely related to peristalsis, no obvious colonic mass. Vascular/Lymphatic: There is a 12 mm portal caval node, series 3, image 24. Suspected 14 mm retrocrural node adjacent to the distal esophagus, series 3, image 15. clustered prominent nodes adjacent to the celiac axis measuring up to 10 mm, series 3, image 22. 8 mm SMA node, series 3, image 28. There is left periaortic retroperitoneal adenopathy, with nodes measuring up to 13 mm, series 3, image 37. No definite enlarged pelvic lymph  nodes. Ectatic  infrarenal aorta measuring 2.7 cm. Aortic atherosclerosis. The portal vein is patent. Reproductive: Prostate is unremarkable. Other: Trace fluid adjacent to the inferior liver tip and edema in the right pericolic gutter. No significant ascites. No omental thickening or nodularity. No free air. Prior left inguinal hernia repair. There is fat in the right inguinal canal. Musculoskeletal: No blastic osseous lesion. No evidence of destructive lytic lesion. Multilevel degenerative change in the spine. No suspicious subcutaneous or abdominal wall lesion. IMPRESSION: 1. Innumerable low-density lesions scattered throughout the liver parenchyma most consistent with diffuse hepatic metastatic disease. 2. Retroperitoneal and upper abdominal adenopathy, suspicious for metastatic disease. 3. Primary malignancy is not definitively characterized, however there is wall thickening of the distal esophagus which raises concern for occult esophageal primary. Recommend further evaluation with endoscopy. 4. Colonic diverticulosis without diverticulitis. Areas of nondistention of the sigmoid colon without dominant colonic mass. 5. Ectatic abdominal aorta at risk for aneurysm development, maximal dimension 2.7 cm. Recommend followup by ultrasound in 5 years. This recommendation follows ACR consensus guidelines: White Paper of the ACR Incidental Findings Committee II on Vascular Findings. J Am Coll Radiol 2013; 10:789-794. Aortic aneurysm NOS (ICD10-I71.9) Aortic Atherosclerosis (ICD10-I70.0). Electronically Signed   By: Keith Rake M.D.   On: 03/01/2020 21:30   MR 3D Recon At Scanner  Result Date: 03/27/2020 CLINICAL DATA:  Jaundice extensive liver metastases from unknown primary EXAM: MRI ABDOMEN WITHOUT AND WITH CONTRAST (INCLUDING MRCP) TECHNIQUE: Multiplanar multisequence MR imaging of the abdomen was performed both before and after the administration of intravenous contrast. Heavily T2-weighted images of the  biliary and pancreatic ducts were obtained, and three-dimensional MRCP images were rendered by post processing. CONTRAST:  35m GADAVIST GADOBUTROL 1 MMOL/ML IV SOLN COMPARISON:  CT abdomen and pelvis of March 23, 2020 FINDINGS: Lower chest: RIGHT-sided pleural effusion similar to previous evaluation. Is basilar airspace disease. Similar to prior study. Mediastinal adenopathy better displayed on a recent CT angiogram of the chest. Hepatobiliary: Marked worsening of hepatic metastatic disease as on the recent CT of the abdomen and pelvis near confluent in the LEFT hepatic lobe. Gallbladder is decompressed otherwise unremarkable. Areas of segmental biliary ductal distension in the RIGHT hepatic lobe seen to anterior inferior RIGHT hemi liver and anterior superior RIGHT hemi liver likely due to local mass effect. Common duct is nondilated as are RIGHT and LEFT hepatic ducts. Gallbladder is collapsed upon itself likely accounting for the appearance on the previous imaging study. Portal vein is patent. Pancreas: No mass, inflammatory changes, or other parenchymal abnormality identified. Spleen:  Normal size without suspicious focal lesion. Adrenals/Urinary Tract: Adrenal glands are normal. LEFT renal cyst. No hydronephrosis. Stomach/Bowel: Limited assessment of the gastrointestinal tract with moderate colonic distension in the upper abdomen nearly 7 cm. Vascular/Lymphatic: Atheromatous plaque of the abdominal aorta, no aneurysmal dilation. Adenopathy in the retroperitoneum and upper abdomen is similar to the previous study. Other:  Ascites better demonstrated on CT of the abdomen and pelvis. Musculoskeletal: No suspicious bone lesions identified. IMPRESSION: 1. Marked worsening of hepatic metastatic disease as on the recent CT of the abdomen and pelvis near confluent in the LEFT hepatic lobe. 2. Areas of segmental biliary ductal distension in the RIGHT hepatic lobe seen to anterior inferior RIGHT hemi liver and anterior  superior RIGHT hemi liver likely due to local mass effect. Scattered small peripheral biliary radicles throughout the liver in other areas with less dilation, essentially mild peripheral dilation elsewhere. This is likely due to local areas of mass effect in the  setting of worsening of hepatic metastatic disease which may also explain the degree of hepatic dysfunction. 3. Collapsed gallbladder 4. Common duct is nondilated as are RIGHT and LEFT hepatic ducts. 5. Adenopathy in the retroperitoneum and upper abdomen is similar to the previous study. Also on some series adenopathy in the chest is partially imaged, better seen on recent chest imaging from May of 2021 but likely enlarged since that time. 6. Limited assessment of the gastrointestinal tract with moderate colonic distension in the upper abdomen nearly 7 cm. Correlate with any clinical signs of developing colonic ileus. 7. RIGHT-sided pleural effusion similar to previous evaluation. 8. Aortic atherosclerosis. Aortic Atherosclerosis (ICD10-I70.0). Electronically Signed   By: Zetta Bills M.D.   On: 03/27/2020 15:35   US BIOPSY (LIVER)  Result Date: 03/24/2020 INDICATION: No known primary, now with multiple infiltrative liver lesions worrisome for metastatic disease. Please perform ultrasound-guided liver lesion biopsy for tissue diagnostic purposes. EXAM: ULTRASOUND GUIDED LIVER LESION BIOPSY COMPARISON:  CT abdomen pelvis-03/23/2020; 03/01/2020 MEDICATIONS: None ANESTHESIA/SEDATION: Fentanyl 100 mcg IV; Versed 2 mg IV Total Moderate Sedation time:  10 Minutes. The patient's level of consciousness and vital signs were monitored continuously by radiology nursing throughout the procedure under my direct supervision. COMPLICATIONS: None immediate. PROCEDURE: Informed written consent was obtained from the patient after a discussion of the risks, benefits and alternatives to treatment. The patient understands and consents the procedure. A timeout was performed  prior to the initiation of the procedure. Ultrasound scanning was performed of the right upper abdominal quadrant demonstrates multiple peripherally hypoechoic nodules and masses scattered throughout the liver compatible with findings seen on preceding noncontrast abdominal CT. A dominant approximately 2.4 x 2.2 cm hypoechoic nodule within the subcapsular aspect the right lobe of the liver, likely correlating with the dominant lesion seen on preceding abdominal CT image 26, series 2, was targeted for biopsy given lesion location and sonographic window (image 11). The procedure was planned. The right upper abdominal quadrant was prepped and draped in the usual sterile fashion. The overlying soft tissues were anesthetized with 1% lidocaine with epinephrine. A 17 gauge, 6.8 cm co-axial needle was advanced into a peripheral aspect of the lesion. This was followed by 6 core biopsies with an 18 gauge core device under direct ultrasound guidance. The coaxial needle tract was embolized with a small amount of Gel-Foam slurry and superficial hemostasis was obtained with manual compression. Post procedural scanning was negative for definitive area of hemorrhage or additional complication. A dressing was placed. The patient tolerated the procedure well without immediate post procedural complication. IMPRESSION: Technically successful ultrasound guided core needle biopsy of indeterminate lesion within the right lobe of the liver. Electronically Signed   By: Sandi Mariscal M.D.   On: 03/24/2020 15:27   DG CHEST PORT 1 VIEW  Result Date: 03/26/2020 CLINICAL DATA:  Dyspnea. EXAM: PORTABLE CHEST 1 VIEW COMPARISON:  03/20/2020 FINDINGS: Loop recorder projects over the left heart. Lungs are hypoinflated with mild stable elevation of the right hemidiaphragm. Possible mild linear atelectasis left base. No evidence of effusion. Cardiomediastinal silhouette and remainder the exam is unchanged. IMPRESSION: Hypoinflation without acute  cardiopulmonary disease. Minimal linear atelectasis left base. Stable elevation right hemidiaphragm. Electronically Signed   By: Marin Olp M.D.   On: 03/26/2020 13:30   MR ABDOMEN MRCP W WO CONTAST  Result Date: 03/27/2020 CLINICAL DATA:  Jaundice extensive liver metastases from unknown primary EXAM: MRI ABDOMEN WITHOUT AND WITH CONTRAST (INCLUDING MRCP) TECHNIQUE: Multiplanar multisequence MR imaging of the  abdomen was performed both before and after the administration of intravenous contrast. Heavily T2-weighted images of the biliary and pancreatic ducts were obtained, and three-dimensional MRCP images were rendered by post processing. CONTRAST:  72m GADAVIST GADOBUTROL 1 MMOL/ML IV SOLN COMPARISON:  CT abdomen and pelvis of March 23, 2020 FINDINGS: Lower chest: RIGHT-sided pleural effusion similar to previous evaluation. Is basilar airspace disease. Similar to prior study. Mediastinal adenopathy better displayed on a recent CT angiogram of the chest. Hepatobiliary: Marked worsening of hepatic metastatic disease as on the recent CT of the abdomen and pelvis near confluent in the LEFT hepatic lobe. Gallbladder is decompressed otherwise unremarkable. Areas of segmental biliary ductal distension in the RIGHT hepatic lobe seen to anterior inferior RIGHT hemi liver and anterior superior RIGHT hemi liver likely due to local mass effect. Common duct is nondilated as are RIGHT and LEFT hepatic ducts. Gallbladder is collapsed upon itself likely accounting for the appearance on the previous imaging study. Portal vein is patent. Pancreas: No mass, inflammatory changes, or other parenchymal abnormality identified. Spleen:  Normal size without suspicious focal lesion. Adrenals/Urinary Tract: Adrenal glands are normal. LEFT renal cyst. No hydronephrosis. Stomach/Bowel: Limited assessment of the gastrointestinal tract with moderate colonic distension in the upper abdomen nearly 7 cm. Vascular/Lymphatic: Atheromatous  plaque of the abdominal aorta, no aneurysmal dilation. Adenopathy in the retroperitoneum and upper abdomen is similar to the previous study. Other:  Ascites better demonstrated on CT of the abdomen and pelvis. Musculoskeletal: No suspicious bone lesions identified. IMPRESSION: 1. Marked worsening of hepatic metastatic disease as on the recent CT of the abdomen and pelvis near confluent in the LEFT hepatic lobe. 2. Areas of segmental biliary ductal distension in the RIGHT hepatic lobe seen to anterior inferior RIGHT hemi liver and anterior superior RIGHT hemi liver likely due to local mass effect. Scattered small peripheral biliary radicles throughout the liver in other areas with less dilation, essentially mild peripheral dilation elsewhere. This is likely due to local areas of mass effect in the setting of worsening of hepatic metastatic disease which may also explain the degree of hepatic dysfunction. 3. Collapsed gallbladder 4. Common duct is nondilated as are RIGHT and LEFT hepatic ducts. 5. Adenopathy in the retroperitoneum and upper abdomen is similar to the previous study. Also on some series adenopathy in the chest is partially imaged, better seen on recent chest imaging from May of 2021 but likely enlarged since that time. 6. Limited assessment of the gastrointestinal tract with moderate colonic distension in the upper abdomen nearly 7 cm. Correlate with any clinical signs of developing colonic ileus. 7. RIGHT-sided pleural effusion similar to previous evaluation. 8. Aortic atherosclerosis. Aortic Atherosclerosis (ICD10-I70.0). Electronically Signed   By: GZetta BillsM.D.   On: 03/27/2020 15:35   CUP PACEART REMOTE DEVICE CHECK  Result Date: 03/27/2020 [facility] summary report received. Battery status OK. Normal device function. No new symptom episodes, tachy episodes, brady, or pause episodes. No new AF episodes. Monthly summary reports and ROV/PRN. LFelisa Bonier RN, MSN  [place]Abdomen Limited  RUQ  Result Date: 03/23/2020 CLINICAL DATA:  Hepatic metastatic disease, abnormal gallbladder on CT EXAM: ULTRASOUND ABDOMEN LIMITED RIGHT UPPER QUADRANT COMPARISON:  03/23/2020 FINDINGS: Gallbladder: Not well visualized. The gallbladder was completely decompressed on preceding CT. Common bile duct: Diameter: 5 mm Liver: Numerous hypoechoic masses are seen throughout the liver parenchyma consistent with known metastatic disease. No intrahepatic duct dilation. Portal vein is patent on color Doppler imaging with normal direction of blood flow towards  the liver. Other: None. IMPRESSION: 1. Diffuse liver metastases. 2. Nonvisualization of the gallbladder due to decompressed state. Electronically Signed   By: Randa Ngo M.D.   On: 03/23/2020 22:19    ASSESSMENT AND PLAN: 1.  Liver metastases -suspect pancreatic cancer versus cholangiocarcinoma  -CT abdomen pelvis on 03/01/2020-no role masses in the patient's liver concerning for metastatic disease,  mildly enlarged mediastinal lymph nodes  -03/24/2020 -CEA 118, CA 19.9 128,346  -03/24/2020-ultrasound-guided liver biopsy; adenocarcinoma, cytokeratin 7 and cytokeratin 20 positive, differential diagnosis includes cholangiocarcinoma, upper gastrointestinal, and pancreatobiliary carcinomas 2.  Worsening liver dysfunction 3.  Hyponatremia 4.  AKI 5.  COPD 6.  Hypertension 7.  History of CVA 8.  History of CAD status post stent 9.  Leukocytosis 10.  Thrombocytopenia  Mr. Lundeen appears more somnolent today.  He has worsening renal and liver dysfunction.  Liver biopsy was obtained on 03/24/2020 and results are pending.  Pathology will be contacted today for preliminary results. He has a mildly elevated CEA and markedly elevated Ca 19.9.  Clinical presentation is concerning for pancreatic cancer versus cholangiocarcinoma.  1.  We will contact pathology for preliminary biopsy results. 2.  May need to consider EGD and EUS pending biopsy results. 3.  Further  recommendations for treatment when biopsy results known.   LOS: 5 days   Mikey Bussing, DNP, AGPCNP-BC, AOCNP 03/28/20 Mr. Route was interviewed and examined.  I reviewed the CT images and discussed the case with Dr. Havery Moros.  I saw him earlier this morning and again at approximately 1 PM.  His wife and daughter were at the bedside this afternoon.  The pathology from the liver biopsy confirms adenocarcinoma.  The clinical presentation and biopsy findings are most consistent with cholangiocarcinoma, though a definite primary tumor site has not been defined.  It is possible he has another occult gastrointestinal primary.  Mr. Tiedt has a poor performance status and hepatic encephalopathy.  He has renal failure.  I discussed the pathology findings and poor prognosis with Mr. Weimar and his family.  Mr. Pietrzak appeared very lethargic and it is unclear whether he was able to comprehend our discussion.  I would recommend gemcitabine/cisplatin chemotherapy or FOLFOX if his performance status and liver function were better.  I do not recommend chemotherapy in his case.  We discussed the small chance he would be a candidate for immunotherapy.  I will request the liver biopsy be submitted for microsatellite instability testing.  I recommend comfort care.  He is a candidate for home hospice care or [employer].  His family would like to take him home if this can be arranged.  We discussed CPR and ACLS issues.  His wife and daughter agree he should be placed on a no CODE BLUE status.  I will plan to serve as the primary provider with the hospice program.  Recommendations: 1.  Continue treatment of hepatic encephalopathy to see if he will become more alert and able to participate in discussion 2.  [facility] referral for home hospice care versus [facility] 3.  No CODE BLUE

## 2020-03-29 DIAGNOSIS — C799 Secondary malignant neoplasm of unspecified site: Secondary | ICD-10-CM

## 2020-03-29 DIAGNOSIS — Z7189 Other specified counseling: Secondary | ICD-10-CM

## 2020-03-29 DIAGNOSIS — Z515 Encounter for palliative care: Secondary | ICD-10-CM

## 2020-03-29 LAB — PROTIME-INR
INR: 1.8 — ABNORMAL HIGH (ref 0.8–1.2)
Prothrombin Time: 20 seconds — ABNORMAL HIGH (ref 11.4–15.2)

## 2020-03-29 LAB — COMPREHENSIVE METABOLIC PANEL
ALT: 261 U/L — ABNORMAL HIGH (ref 0–44)
AST: 238 U/L — ABNORMAL HIGH (ref 15–41)
Albumin: 2 g/dL — ABNORMAL LOW (ref 3.5–5.0)
Alkaline Phosphatase: 714 U/L — ABNORMAL HIGH (ref 38–126)
Anion gap: 19 — ABNORMAL HIGH (ref 5–15)
BUN: 98 mg/dL — ABNORMAL HIGH (ref 8–23)
CO2: 20 mmol/L — ABNORMAL LOW (ref 22–32)
Calcium: 7.3 mg/dL — ABNORMAL LOW (ref 8.9–10.3)
Chloride: 86 mmol/L — ABNORMAL LOW (ref 98–111)
Creatinine, Ser: 2.97 mg/dL — ABNORMAL HIGH (ref 0.61–1.24)
GFR calc Af Amer: 24 mL/min — ABNORMAL LOW (ref >60)
GFR calc non Af Amer: 21 mL/min — ABNORMAL LOW (ref >60)
Glucose, Bld: 94 mg/dL (ref 70–99)
Potassium: 5.7 mmol/L — ABNORMAL HIGH (ref 3.5–5.1)
Sodium: 125 mmol/L — ABNORMAL LOW (ref 135–145)
Total Bilirubin: 14.2 mg/dL — ABNORMAL HIGH (ref 0.3–1.2)
Total Protein: 5.7 g/dL — ABNORMAL LOW (ref 6.5–8.1)

## 2020-03-29 LAB — CBC
HCT: 41.2 % (ref 39.0–52.0)
Hemoglobin: 14.5 g/dL (ref 13.0–17.0)
MCH: 26.3 pg (ref 26.0–34.0)
MCHC: 35.2 g/dL (ref 30.0–36.0)
MCV: 74.8 fL — ABNORMAL LOW (ref 80.0–100.0)
Platelets: 126 10*3/uL — ABNORMAL LOW (ref 150–400)
RBC: 5.51 MIL/uL (ref 4.22–5.81)
RDW: 18.3 % — ABNORMAL HIGH (ref 11.5–15.5)
WBC: 29.1 10*3/uL — ABNORMAL HIGH (ref 4.0–10.5)
nRBC: 0.2 % (ref 0.0–0.2)

## 2020-03-29 LAB — MAGNESIUM: Magnesium: 3.5 mg/dL — ABNORMAL HIGH (ref 1.7–2.4)

## 2020-03-29 MED ORDER — HYDROMORPHONE HCL 1 MG/ML IJ SOLN: 1.0000 mg | INTRAMUSCULAR | 0 refills | Status: AC | PRN

## 2020-03-29 MED ORDER — SENNOSIDES-DOCUSATE SODIUM 8.6-50 MG PO TABS: 2.0000 | ORAL_TABLET | Freq: Every evening | ORAL | Status: AC | PRN

## 2020-03-29 MED ORDER — LACTULOSE 10 GM/15ML PO SOLN: 20.0000 g | Freq: Three times a day (TID) | ORAL | 0 refills | Status: AC

## 2020-03-29 MED ORDER — BISACODYL 5 MG PO TBEC: 5.0000 mg | DELAYED_RELEASE_TABLET | Freq: Every day | ORAL | 0 refills | Status: AC | PRN

## 2020-03-29 MED ORDER — SODIUM ZIRCONIUM CYCLOSILICATE 10 G PO PACK
10.0000 g | PACK | Freq: Once | ORAL | Status: AC
  Administered 2020-03-29: 10 g via ORAL
  Filled 2020-03-29: qty 1

## 2020-03-29 MED ORDER — DIPHENHYDRAMINE HCL 25 MG PO CAPS: 25.0000 mg | ORAL_CAPSULE | Freq: Four times a day (QID) | ORAL | 0 refills | Status: AC | PRN

## 2020-03-29 MED ORDER — OXYCODONE HCL 10 MG PO TABS: 10.0000 mg | ORAL_TABLET | ORAL | 0 refills | Status: AC | PRN

## 2020-03-29 NOTE — Progress Notes (Addendum)
Manufacturing engineer [facility]) Hospital Liaison Note  Received request from [facility] Kathrin [place]ouse)  for family interest in [facility] with request for transfer this afternoon. Chart reviewed. Spoke with patient's family to confirm interest and explain services. Family agreeable to transfer today after completion of [employer] is completed - TOC aware.    Please fax discharge summary to [phone] and arrange transport for patient once registration paper work is done (will update this note and TOC when ready to transfer).  RN - please call report to [phone].  Thank you.   Gar Ponto, RN [facility] Liaison  [place] are on [place] PAPERWORK COMPLETE - PLEASE ARRANGE TRANSPORT FOR PT TO ARRIVE AT [facility] ASAP. TOC MADE AWARE. THANKS FOR THIS REFERRAL

## 2020-03-29 NOTE — Progress Notes (Signed)
HEMATOLOGY-ONCOLOGY PROGRESS NOTE  SUBJECTIVE: He is alert this morning.  No specific complaint.  His daughter is at the bedside. PHYSICAL EXAMINATION:  Vitals:   03/29/20 0637 03/29/20 0835  BP: 123/75   Pulse: 87   Resp:    Temp:    SpO2: 94% 94%   Filed Weights   03/23/20 1611 03/24/20 0325 03/28/20 1100  Weight: 237 lb (107.5 kg) 237 lb (107.5 kg) 234 lb 12.6 oz (106.5 kg)    Intake/Output from previous day: 06/22 0701 - 06/23 0700 In: 0  Out: 590 [Urine:590]  GENERAL: Chronically ill-appearing, no distress EYES: Scleral icterus noted OROPHARYNX: Oral mucosa dry, no thrush or mucositis ABDOMEN: Liver edge is palpable in the mid upper abdomen with associated tenderness  NEURO: Alert, follows commands, answers questions  LABORATORY DATA:  I have reviewed the data as listed CMP Latest Ref Rng & Units 03/29/2020 03/28/2020 03/27/2020  Glucose 70 - 99 mg/dL 94 117(H) -  BUN 8 - 23 mg/dL 98(H) 73(H) -  Creatinine 0.61 - 1.24 mg/dL 2.97(H) 2.05(H) -  Sodium 135 - 145 mmol/L 125(L) 124(L) -  Potassium 3.5 - 5.1 mmol/L 5.7(H) 5.4(H) 5.0  Chloride 98 - 111 mmol/L 86(L) 86(L) -  CO2 22 - 32 mmol/L 20(L) 20(L) -  Calcium 8.9 - 10.3 mg/dL 7.3(L) 7.4(L) -  Total Protein 6.5 - 8.1 g/dL 5.7(L) 5.8(L) -  Total Bilirubin 0.3 - 1.2 mg/dL 14.2(H) 12.8(H) -  Alkaline Phos 38 - 126 U/L 714(H) 658(H) -  AST 15 - 41 U/L 238(H) 212(H) -  ALT 0 - 44 U/L 261(H) 249(H) -    Lab Results  Component Value Date   WBC 29.1 (H) 03/29/2020   HGB 14.5 03/29/2020   HCT 41.2 03/29/2020   MCV 74.8 (L) 03/29/2020   PLT 126 (L) 03/29/2020   NEUTROABS 14.9 (H) 03/23/2020    DG Chest 2 View  Result Date: 03/20/2020 CLINICAL DATA:  Chest pain and shortness of breath EXAM: CHEST - 2 VIEW COMPARISON:  Mar 01, 2016 FINDINGS: There is bibasilar atelectatic change. The lungs elsewhere are clear. The heart size and pulmonary vascularity are normal. No adenopathy. There is aortic atherosclerosis. Loop  recorder present on the left anteriorly. There is degenerative change in the thoracic spine. IMPRESSION: Bibasilar atelectasis. No edema or airspace opacity. Stable cardiac silhouette. Aortic Atherosclerosis (ICD10-I70.0). Electronically Signed   By: Lowella Grip III M.D.   On: 03/20/2020 13:11   DG Chest 2 View  Result Date: 03/01/2020 CLINICAL DATA:  Chest pain EXAM: CHEST - 2 VIEW COMPARISON:  May 03, 2019 FINDINGS: There is mild bibasilar atelectasis. Lungs otherwise are clear. The heart size and pulmonary vascularity are normal. No adenopathy. There are foci left anterior descending coronary artery calcification. There is aortic atherosclerosis. There is degenerative change in the thoracic spine. There is a loop recorder on the left anteriorly. IMPRESSION: Bibasilar atelectasis.  Lungs otherwise clear.  Heart size normal. Coronary artery calcification noted. Aortic Atherosclerosis (ICD10-I70.0). Loop recorder on left anteriorly. Electronically Signed   By: Lowella Grip III M.D.   On: 03/01/2020 10:23   CT Angio Chest PE W/Cm &/Or Wo Cm  Result Date: 03/01/2020 CLINICAL DATA:  Shortness of breath. Concern for pulmonary embolism. Positive D-dimer. EXAM: CT ANGIOGRAPHY CHEST WITH CONTRAST TECHNIQUE: Multidetector CT imaging of the chest was performed using the standard protocol during bolus administration of intravenous contrast. Multiplanar CT image reconstructions and MIPs were obtained to evaluate the vascular anatomy. CONTRAST:  50mL OMNIPAQUE IOHEXOL  350 MG/ML SOLN COMPARISON:  None. FINDINGS: Cardiovascular: Contrast injection is sufficient to demonstrate satisfactory opacification of the pulmonary arteries to the segmental level. There is no pulmonary embolus. The main pulmonary artery is within normal limits for size. There is no CT evidence of acute right heart strain. There are atherosclerotic changes of the visualized thoracic aorta. The thoracic aorta is borderline aneurysmal  measuring approximately 4 cm in diameter. Heart size is coronary artery calcifications are noted. Mediastinum/Nodes: --there are mildly enlarged mediastinal lymph nodes. For example there is a right paratracheal lymph node measuring approximately 1.5 cm (axial series 5, image 52). --No axillary lymphadenopathy. --No supraclavicular lymphadenopathy. --Normal thyroid gland. --The esophagus is unremarkable Lungs/Pleura: There is no pneumothorax. Atelectasis is noted at the lung bases. There is no significant pleural effusion. There is a nodule along the major fissure on the left favored to represent a small lymph node. Upper Abdomen: There are innumerable low-attenuation masses throughout the patient's liver. There are few mildly enlarged lymph nodes in the upper abdomen. Musculoskeletal: No chest wall abnormality. No acute or significant osseous findings. Review of the MIP images confirms the above findings. IMPRESSION: 1. No acute pulmonary embolism. 2. There are innumerable masses in the patient's liver concerning for metastatic disease, currently with an unknown primary. 3. There are mildly enlarged mediastinal lymph nodes which may be reactive or metastatic. Attention on follow-up examinations is recommended. 4. Bibasilar atelectasis is noted. Aortic Atherosclerosis (ICD10-I70.0). Electronically Signed   By: Constance Holster M.D.   On: 03/01/2020 19:03   CT ABDOMEN PELVIS W CONTRAST  Result Date: 03/23/2020 CLINICAL DATA:  Abdominal distension, abdominal pain with elevated LFTs with known metastatic disease to the liver. EXAM: CT ABDOMEN AND PELVIS WITH CONTRAST TECHNIQUE: Multidetector CT imaging of the abdomen and pelvis was performed using the standard protocol following bolus administration of intravenous contrast. CONTRAST:  147mL OMNIPAQUE IOHEXOL 300 MG/ML  SOLN COMPARISON:  03/01/2020 FINDINGS: Lower chest: Interval development of RIGHT-sided effusion and basilar airspace disease since the prior  study. Calcified coronary artery disease. No pericardial effusion. Heart is incompletely imaged. Minimal atelectasis at the LEFT lung base. Hepatobiliary: Multifocal hepatic metastatic lesions now more confluent and enlarging when compared to the prior study. (Image 26, series 2) 3.5 cm lesion previously 2.9 cm. Near confluent disease in the LEFT hepatic lobe on today's exam. Area of confluent disease in the RIGHT hepatic lobe (image 39, series 2) 5.0 cm at most 3.7 cm on the prior study and without discrete intervening normal hepatic parenchyma between lesions in this area. Portal vein is patent. Gallbladder is collapsed. Question of lack of enhancement of the medial wall of the gallbladder. In the current context the significance is uncertain the gallbladder could be collapse upon itself. Signs of ascites and small pericholecystic fluid. Portal vein is patent. Pancreas: Pancreas is normal without ductal dilation or inflammation. Spleen: Spleen normal size and contour. Adrenals/Urinary Tract: Adrenal glands are normal. Symmetric renal enhancement. No hydronephrosis. LEFT renal cyst arises from lateral LEFT kidney as before. Urinary bladder is normal to the extent evaluated aside from LEFT posterolateral bladder diverticulum. Stomach/Bowel: Stomach is under distended. Small bowel without overt signs of obstruction. Mildly dilated distal small bowel loops filled with liquid stool like material. Stool and gas filling the proximal colon. Colonic diverticulosis. No diverticulitis. The appendix is normal. Vascular/Lymphatic: Calcific and noncalcific atheromatous plaque of the abdominal aorta. Mild dilation without aneurysm. 2.9 cm greatest axial dimension. Retroperitoneal adenopathy. (Image 46, series 2) 1.3 cm previously 1  cm short axis. Periportal nodal enlargement is similar. Juxta crural lymph node and hepatic gastric lymph nodes are similar. Reproductive: Prostate is unremarkable by CT. Other: Post LEFT inguinal  herniorrhaphy. Ascites which is new from the prior study. No signs of free air. Musculoskeletal: Spinal degenerative changes. No acute or destructive bone process. IMPRESSION: 1. Given new ascites and lack of gallbladder wall enhancement with collapse of the gallbladder since previous imaging studies would suggest focused assessment with gallbladder ultrasound to ensure gallbladder wall integrity. HIDA scan is likely to be un informative at this time due to hepatic dysfunction. 2. Multifocal hepatic metastatic lesions now more confluent and enlarging when compared to the prior study. 3. New small volume ascites is more likely related to worsening hepatic dysfunction in the setting of extensive hepatic metastatic disease. 4. Interval development of RIGHT-sided effusion and basilar airspace disease since the prior study. 5. Colonic diverticulosis without diverticulitis. 6. Aortic atherosclerosis. Aortic Atherosclerosis (ICD10-I70.0). Electronically Signed   By: Zetta Bills M.D.   On: 03/23/2020 20:17   CT ABDOMEN PELVIS W CONTRAST  Result Date: 03/01/2020 CLINICAL DATA:  Chest CTA with findings suspicious for hepatic metastatic disease. EXAM: CT ABDOMEN AND PELVIS WITH CONTRAST TECHNIQUE: Multidetector CT imaging of the abdomen and pelvis was performed using the standard protocol following bolus administration of intravenous contrast. CONTRAST:  20mL OMNIPAQUE IOHEXOL 300 MG/ML  SOLN COMPARISON:  Chest CTA earlier today. Abdominopelvic CT 08/17/2018 FINDINGS: Lower chest: Assessed on chest CT earlier today. Right greater than left basilar atelectasis. Pleural thickening without significant effusion. Hepatobiliary: Innumerable low-density lesions scattered throughout the liver parenchyma most consistent with diffuse hepatic metastatic disease. Majority of the lesions are 1-2 cm. Largest lesion in the right hepatic dome measures 2.4 cm. Gallbladder physiologically distended, no calcified stone. No biliary  dilatation. Pancreas: No ductal dilatation or inflammation. No evidence of pancreatic mass. Spleen: Normal in size without focal abnormality. Adrenals/Urinary Tract: Mild thickening of the lateral left adrenal gland is unchanged from prior exam. The right adrenal gland is normal. There is no suspicious adrenal nodule. No hydronephrosis. There is symmetric bilateral perinephric edema. There is a 3.3 cm exophytic low-density lesion from the posterior left kidney that likely represents a minimally complex cyst. This is not significantly changed in size from prior exam. Excreted IV contrast within both renal collecting systems which limits assessment for stone. Probable parapelvic cyst in the left kidney. There is excreted IV contrast within the urinary bladder without evidence of bladder wall thickening or focal lesion. Stomach/Bowel: Detailed bowel evaluation is limited in the absence of enteric contrast. Mild wall thickening of the distal esophagus. Unopacified stomach is unremarkable. Normal positioning of the ligament of Treitz. Small duodenal diverticulum. No small bowel inflammation or obstruction. No obvious small bowel mass. Normal appendix. Moderate volume of stool throughout the colon. Diverticulosis from the splenic flexure distally. No diverticulitis. Few areas of nondistention involving the sigmoid colon likely related to peristalsis, no obvious colonic mass. Vascular/Lymphatic: There is a 12 mm portal caval node, series 3, image 24. Suspected 14 mm retrocrural node adjacent to the distal esophagus, series 3, image 15. clustered prominent nodes adjacent to the celiac axis measuring up to 10 mm, series 3, image 22. 8 mm SMA node, series 3, image 28. There is left periaortic retroperitoneal adenopathy, with nodes measuring up to 13 mm, series 3, image 37. No definite enlarged pelvic lymph nodes. Ectatic infrarenal aorta measuring 2.7 cm. Aortic atherosclerosis. The portal vein is patent. Reproductive:  Prostate is unremarkable.  Other: Trace fluid adjacent to the inferior liver tip and edema in the right pericolic gutter. No significant ascites. No omental thickening or nodularity. No free air. Prior left inguinal hernia repair. There is fat in the right inguinal canal. Musculoskeletal: No blastic osseous lesion. No evidence of destructive lytic lesion. Multilevel degenerative change in the spine. No suspicious subcutaneous or abdominal wall lesion. IMPRESSION: 1. Innumerable low-density lesions scattered throughout the liver parenchyma most consistent with diffuse hepatic metastatic disease. 2. Retroperitoneal and upper abdominal adenopathy, suspicious for metastatic disease. 3. Primary malignancy is not definitively characterized, however there is wall thickening of the distal esophagus which raises concern for occult esophageal primary. Recommend further evaluation with endoscopy. 4. Colonic diverticulosis without diverticulitis. Areas of nondistention of the sigmoid colon without dominant colonic mass. 5. Ectatic abdominal aorta at risk for aneurysm development, maximal dimension 2.7 cm. Recommend followup by ultrasound in 5 years. This recommendation follows ACR consensus guidelines: White Paper of the ACR Incidental Findings Committee II on Vascular Findings. J Am Coll Radiol 2013; 10:789-794. Aortic aneurysm NOS (ICD10-I71.9) Aortic Atherosclerosis (ICD10-I70.0). Electronically Signed   By: Keith Rake M.D.   On: 03/01/2020 21:30   MR 3D Recon At Scanner  Result Date: 03/27/2020 CLINICAL DATA:  Jaundice extensive liver metastases from unknown primary EXAM: MRI ABDOMEN WITHOUT AND WITH CONTRAST (INCLUDING MRCP) TECHNIQUE: Multiplanar multisequence MR imaging of the abdomen was performed both before and after the administration of intravenous contrast. Heavily T2-weighted images of the biliary and pancreatic ducts were obtained, and three-dimensional MRCP images were rendered by post processing.  CONTRAST:  55mL GADAVIST GADOBUTROL 1 MMOL/ML IV SOLN COMPARISON:  CT abdomen and pelvis of March 23, 2020 FINDINGS: Lower chest: RIGHT-sided pleural effusion similar to previous evaluation. Is basilar airspace disease. Similar to prior study. Mediastinal adenopathy better displayed on a recent CT angiogram of the chest. Hepatobiliary: Marked worsening of hepatic metastatic disease as on the recent CT of the abdomen and pelvis near confluent in the LEFT hepatic lobe. Gallbladder is decompressed otherwise unremarkable. Areas of segmental biliary ductal distension in the RIGHT hepatic lobe seen to anterior inferior RIGHT hemi liver and anterior superior RIGHT hemi liver likely due to local mass effect. Common duct is nondilated as are RIGHT and LEFT hepatic ducts. Gallbladder is collapsed upon itself likely accounting for the appearance on the previous imaging study. Portal vein is patent. Pancreas: No mass, inflammatory changes, or other parenchymal abnormality identified. Spleen:  Normal size without suspicious focal lesion. Adrenals/Urinary Tract: Adrenal glands are normal. LEFT renal cyst. No hydronephrosis. Stomach/Bowel: Limited assessment of the gastrointestinal tract with moderate colonic distension in the upper abdomen nearly 7 cm. Vascular/Lymphatic: Atheromatous plaque of the abdominal aorta, no aneurysmal dilation. Adenopathy in the retroperitoneum and upper abdomen is similar to the previous study. Other:  Ascites better demonstrated on CT of the abdomen and pelvis. Musculoskeletal: No suspicious bone lesions identified. IMPRESSION: 1. Marked worsening of hepatic metastatic disease as on the recent CT of the abdomen and pelvis near confluent in the LEFT hepatic lobe. 2. Areas of segmental biliary ductal distension in the RIGHT hepatic lobe seen to anterior inferior RIGHT hemi liver and anterior superior RIGHT hemi liver likely due to local mass effect. Scattered small peripheral biliary radicles throughout  the liver in other areas with less dilation, essentially mild peripheral dilation elsewhere. This is likely due to local areas of mass effect in the setting of worsening of hepatic metastatic disease which may also explain the degree of hepatic dysfunction.  3. Collapsed gallbladder 4. Common duct is nondilated as are RIGHT and LEFT hepatic ducts. 5. Adenopathy in the retroperitoneum and upper abdomen is similar to the previous study. Also on some series adenopathy in the chest is partially imaged, better seen on recent chest imaging from May of 2021 but likely enlarged since that time. 6. Limited assessment of the gastrointestinal tract with moderate colonic distension in the upper abdomen nearly 7 cm. Correlate with any clinical signs of developing colonic ileus. 7. RIGHT-sided pleural effusion similar to previous evaluation. 8. Aortic atherosclerosis. Aortic Atherosclerosis (ICD10-I70.0). Electronically Signed   By: Zetta Bills M.D.   On: 03/27/2020 15:35   US BIOPSY (LIVER)  Result Date: 03/24/2020 INDICATION: No known primary, now with multiple infiltrative liver lesions worrisome for metastatic disease. Please perform ultrasound-guided liver lesion biopsy for tissue diagnostic purposes. EXAM: ULTRASOUND GUIDED LIVER LESION BIOPSY COMPARISON:  CT abdomen pelvis-03/23/2020; 03/01/2020 MEDICATIONS: None ANESTHESIA/SEDATION: Fentanyl 100 mcg IV; Versed 2 mg IV Total Moderate Sedation time:  10 Minutes. The patient's level of consciousness and vital signs were monitored continuously by radiology nursing throughout the procedure under my direct supervision. COMPLICATIONS: None immediate. PROCEDURE: Informed written consent was obtained from the patient after a discussion of the risks, benefits and alternatives to treatment. The patient understands and consents the procedure. A timeout was performed prior to the initiation of the procedure. Ultrasound scanning was performed of the right upper abdominal quadrant  demonstrates multiple peripherally hypoechoic nodules and masses scattered throughout the liver compatible with findings seen on preceding noncontrast abdominal CT. A dominant approximately 2.4 x 2.2 cm hypoechoic nodule within the subcapsular aspect the right lobe of the liver, likely correlating with the dominant lesion seen on preceding abdominal CT image 26, series 2, was targeted for biopsy given lesion location and sonographic window (image 11). The procedure was planned. The right upper abdominal quadrant was prepped and draped in the usual sterile fashion. The overlying soft tissues were anesthetized with 1% lidocaine with epinephrine. A 17 gauge, 6.8 cm co-axial needle was advanced into a peripheral aspect of the lesion. This was followed by 6 core biopsies with an 18 gauge core device under direct ultrasound guidance. The coaxial needle tract was embolized with a small amount of Gel-Foam slurry and superficial hemostasis was obtained with manual compression. Post procedural scanning was negative for definitive area of hemorrhage or additional complication. A dressing was placed. The patient tolerated the procedure well without immediate post procedural complication. IMPRESSION: Technically successful ultrasound guided core needle biopsy of indeterminate lesion within the right lobe of the liver. Electronically Signed   By: Sandi Mariscal M.D.   On: 03/24/2020 15:27   DG CHEST PORT 1 VIEW  Result Date: 03/26/2020 CLINICAL DATA:  Dyspnea. EXAM: PORTABLE CHEST 1 VIEW COMPARISON:  03/20/2020 FINDINGS: Loop recorder projects over the left heart. Lungs are hypoinflated with mild stable elevation of the right hemidiaphragm. Possible mild linear atelectasis left base. No evidence of effusion. Cardiomediastinal silhouette and remainder the exam is unchanged. IMPRESSION: Hypoinflation without acute cardiopulmonary disease. Minimal linear atelectasis left base. Stable elevation right hemidiaphragm. Electronically  Signed   By: Marin Olp M.D.   On: 03/26/2020 13:30   MR ABDOMEN MRCP W WO CONTAST  Result Date: 03/27/2020 CLINICAL DATA:  Jaundice extensive liver metastases from unknown primary EXAM: MRI ABDOMEN WITHOUT AND WITH CONTRAST (INCLUDING MRCP) TECHNIQUE: Multiplanar multisequence MR imaging of the abdomen was performed both before and after the administration of intravenous contrast. Heavily T2-weighted images of  the biliary and pancreatic ducts were obtained, and three-dimensional MRCP images were rendered by post processing. CONTRAST:  98mL GADAVIST GADOBUTROL 1 MMOL/ML IV SOLN COMPARISON:  CT abdomen and pelvis of March 23, 2020 FINDINGS: Lower chest: RIGHT-sided pleural effusion similar to previous evaluation. Is basilar airspace disease. Similar to prior study. Mediastinal adenopathy better displayed on a recent CT angiogram of the chest. Hepatobiliary: Marked worsening of hepatic metastatic disease as on the recent CT of the abdomen and pelvis near confluent in the LEFT hepatic lobe. Gallbladder is decompressed otherwise unremarkable. Areas of segmental biliary ductal distension in the RIGHT hepatic lobe seen to anterior inferior RIGHT hemi liver and anterior superior RIGHT hemi liver likely due to local mass effect. Common duct is nondilated as are RIGHT and LEFT hepatic ducts. Gallbladder is collapsed upon itself likely accounting for the appearance on the previous imaging study. Portal vein is patent. Pancreas: No mass, inflammatory changes, or other parenchymal abnormality identified. Spleen:  Normal size without suspicious focal lesion. Adrenals/Urinary Tract: Adrenal glands are normal. LEFT renal cyst. No hydronephrosis. Stomach/Bowel: Limited assessment of the gastrointestinal tract with moderate colonic distension in the upper abdomen nearly 7 cm. Vascular/Lymphatic: Atheromatous plaque of the abdominal aorta, no aneurysmal dilation. Adenopathy in the retroperitoneum and upper abdomen is similar to  the previous study. Other:  Ascites better demonstrated on CT of the abdomen and pelvis. Musculoskeletal: No suspicious bone lesions identified. IMPRESSION: 1. Marked worsening of hepatic metastatic disease as on the recent CT of the abdomen and pelvis near confluent in the LEFT hepatic lobe. 2. Areas of segmental biliary ductal distension in the RIGHT hepatic lobe seen to anterior inferior RIGHT hemi liver and anterior superior RIGHT hemi liver likely due to local mass effect. Scattered small peripheral biliary radicles throughout the liver in other areas with less dilation, essentially mild peripheral dilation elsewhere. This is likely due to local areas of mass effect in the setting of worsening of hepatic metastatic disease which may also explain the degree of hepatic dysfunction. 3. Collapsed gallbladder 4. Common duct is nondilated as are RIGHT and LEFT hepatic ducts. 5. Adenopathy in the retroperitoneum and upper abdomen is similar to the previous study. Also on some series adenopathy in the chest is partially imaged, better seen on recent chest imaging from May of 2021 but likely enlarged since that time. 6. Limited assessment of the gastrointestinal tract with moderate colonic distension in the upper abdomen nearly 7 cm. Correlate with any clinical signs of developing colonic ileus. 7. RIGHT-sided pleural effusion similar to previous evaluation. 8. Aortic atherosclerosis. Aortic Atherosclerosis (ICD10-I70.0). Electronically Signed   By: Zetta Bills M.D.   On: 03/27/2020 15:35   CUP PACEART REMOTE DEVICE CHECK  Result Date: 03/27/2020 [facility] summary report received. Battery status OK. Normal device function. No new symptom episodes, tachy episodes, brady, or pause episodes. No new AF episodes. Monthly summary reports and ROV/PRN. Felisa Bonier, RN, MSN  US Abdomen Limited RUQ  Result Date: 03/23/2020 CLINICAL DATA:  Hepatic metastatic disease, abnormal gallbladder on CT EXAM: ULTRASOUND ABDOMEN  LIMITED RIGHT UPPER QUADRANT COMPARISON:  03/23/2020 FINDINGS: Gallbladder: Not well visualized. The gallbladder was completely decompressed on preceding CT. Common bile duct: Diameter: 5 mm Liver: Numerous hypoechoic masses are seen throughout the liver parenchyma consistent with known metastatic disease. No intrahepatic duct dilation. Portal vein is patent on color Doppler imaging with normal direction of blood flow towards the liver. Other: None. IMPRESSION: 1. Diffuse liver metastases. 2. Nonvisualization of the gallbladder due to  decompressed state. Electronically Signed   By: Randa Ngo M.D.   On: 03/23/2020 22:19    ASSESSMENT AND PLAN: 1.  Liver metastases -suspect pancreatic cancer versus cholangiocarcinoma  -CT abdomen pelvis on 03/01/2020-no role masses in the patient's liver concerning for metastatic disease,  mildly enlarged mediastinal lymph nodes  -03/24/2020 -CEA 118, CA 19.9 128,346  -03/24/2020-ultrasound-guided liver biopsy; adenocarcinoma, cytokeratin 7 and cytokeratin 20 positive, differential diagnosis includes cholangiocarcinoma, upper gastrointestinal, and pancreatobiliary carcinomas 2.  Worsening liver dysfunction 3.  Hyponatremia 4.  AKI 5.  COPD 6.  Hypertension 7.  History of CVA 8.  History of CAD status post stent 9.  Leukocytosis 10.  Thrombocytopenia 11.  Altered mental status secondary to delirium from critical illness, renal failure, and hepatic encephalopathy 12.  Renal failure  Mr. Ehresman has been diagnosed with metastatic adenocarcinoma.  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  He has a poor performance status and progressive renal/liver failure.  Mr. Grigorian is not a candidate for systemic chemotherapy.  I discussed disposition options with Mr. Latin and his daughter.  He agrees to hospice care.  They prefer transfer to [facility]  Recommendations: 1.  Hospice consult to consider transfer to [facility] 2.  Comfort care  measures 3.  Consolidate medical regimen for comfort   LOS: 6 days   Betsy Coder, MD 03/29/20

## 2020-03-29 NOTE — Discharge Summary (Signed)
[facility]  Physician Discharge Summary   Patient ID: Charles Mccoy MRN: [id number] DOB/AGE: [birth year] 69 y.o.  Admit date: 03/23/2020 Discharge date: 03/29/2020  PCP: [name], [name], MD  DISCHARGE DIAGNOSES:  Diffuse liver metastases, unknown adenocarcinoma primary Liver failure Acute hepatic encephalopathy Coagulopathy Acute kidney injury/hyperkalemia Hyponatremia COPD Essential hypertension Coronary artery disease History of CVA   RECOMMENDATIONS FOR OUTPATIENT FOLLOW UP: 1. Patient being discharged to residential hospice    CODE STATUS: DNR  DISCHARGE CONDITION: poor  Diet recommendation: Comfort feeds as tolerated  INITIAL HISTORY: 69 year old with history of COPD, CVA, CAD status post stent, HLD, HTN, recent finding of liver lesions sent to the hospital by oncology for transaminitis and hyperbilirubinemia. Upon admission had lactic acidosis, hyponatremia, 1.66, BUN 45, transaminitis, elevated total bilirubin. CT abdomen pelvis showedascites, multifocal liver lesions.  Consultations:  Oncology  Gastroenterology  Palliative Care  Procedures:  Ultrasound-guided liver biopsy on 03/24/20   HOSPITAL COURSE:   Diffuse liver metastases/adenocarcinoma unknown primary/transaminitis and hyperbilirubinemia Patient was seen by gastroenterology and patient underwent liver biopsy on 6/18. Oncology was also consulted. CT of the abdomen pelvis showed small volume ascites. Palliative care was also consulted. Patient underwent MRCP which showed significant tumor burden but no biliary strictures. Patient noted to have elevated CA 19-9 as well as CEA.  Pathology shows adenocarcinoma.  Could be cholangiocarcinoma although a specific tumor site has not been identified on imaging studies.  He could also have another occult GI primary.  Oncology reviewed these findings with the patient and his family.  Plan is for residential hospice placement.  Pain is  adequately controlled at this time.  Discussed with daughter who was at the bedside.    Acute hepatic encephalopathy/coagulopathy/liver failure Patient noted to have elevated ammonia level along with elevated PT/INR. He has been given vitamin K. He was also started on lactulose .  Ammonia level went from 87 to 64.  Continue current dose for now.  Mentation seems to be stable.  No focal deficits.    Acute kidney injury/hyperkalemia Patient's renal function continues to worsen.  He was given a dose of Lasix yesterday due to peripheral edema.  He has not made much urine.  Potassium remains elevated.  Give him another dose of Lokelma.  Now that the plan is for residential hospice we will not be too aggressive.  His prognosis is very poor.  Hold off on further doses of Lasix.  His Aldactone was also discontinued a few days ago.    Abdominal pain Secondary to liver mets.  Continue as needed pain medication and bowel regimen.    Hyponatremia Suspect hypervolemic hyponatremia in setting of liver disease as pt with distended abdomen and peripheral edema vs SIADH in setting of malignancy.  Urine electrolytes on admission: Na 31, K 31 (pt takes Aldactone).  Urine osmolality is 407 with a serum osmolality of 283.  Most likely SIADH.  Patient was given diuretics with worsening in his renal function.    MildCOPD exacerbationwith Cough Seems to be stable.  Steroids were discontinued.  Leukocytosis WBC continues to climb.  Probably related to his metastatic cancer.  No obvious source of infection.  Prognosis is poor.    Generalized weakness Due to above.   Essential hypertension  Amlodipine on hold due to borderline low blood pressures.   History of CAD s/p stent Patient on aspirin Plavix statin.  Plavix was held for the liver biopsy.  Now resumed.  Cardiac cath July 2020-severe LAD stenosis status post  DES. Initially on aspirin and Brilinta then changed to Plavix.  History of CVA, 2008 On  aspirin and Plavix  Patient to be discharged to residential hospice today.   PERTINENT LABS:  The results of significant diagnostics from this hospitalization (including imaging, microbiology, ancillary and laboratory) are listed below for reference.    Microbiology: Recent Results (from the past 240 hour(s))  Blood culture (routine x 2)     Status: None   Collection Time: 03/23/20  6:53 PM   Specimen: BLOOD  Result Value Ref Range Status   Specimen Description   Final    BLOOD LEFT ANTECUBITAL Performed at [facility] [address] [place], [place] [address]    Special Requests   Final    BOTTLES DRAWN AEROBIC AND ANAEROBIC Blood Culture adequate volume Performed at [facility] [address] [place], [place] [address]    Culture   Final    NO GROWTH 5 DAYS Performed at [facility], [place] [address], [place], [place] [address]    Report Status 03/28/2020 FINAL  Final  Blood culture (routine x 2)     Status: None   Collection Time: 03/23/20  6:53 PM   Specimen: BLOOD RIGHT FOREARM  Result Value Ref Range Status   Specimen Description   Final    BLOOD RIGHT FOREARM Performed at [facility] [address] [place], [place] [address]    Special Requests   Final    BOTTLES DRAWN AEROBIC AND ANAEROBIC Blood Culture adequate volume Performed at [facility] [address], [place], [place] [address]    Culture   Final    NO GROWTH 5 DAYS Performed at [facility], [place] [address], [place],  [address]    Report Status 03/28/2020 FINAL  Final  SARS Coronavirus 2 by RT PCR (hospital order, performed in [facility] hospital lab) Nasopharyngeal Nasopharyngeal Swab     Status: None   Collection Time: 03/24/20 10:42 AM   Specimen: Nasopharyngeal Swab  Result Value Ref Range Status   SARS Coronavirus 2 NEGATIVE NEGATIVE Final    Comment: (NOTE) SARS-CoV-2 target nucleic acids  are NOT DETECTED.  The SARS-CoV-2 RNA is generally detectable in upper and lower respiratory specimens during the acute phase of infection. The lowest concentration of SARS-CoV-2 viral copies this assay can detect is 250 copies / mL. A negative result does not preclude SARS-CoV-2 infection and should not be used as the sole basis for treatment or other patient management decisions.  A negative result may occur with improper specimen collection / handling, submission of specimen other than nasopharyngeal swab, presence of viral mutation(s) within the areas targeted by this assay, and inadequate number of viral copies (<250 copies / mL). A negative result must be combined with clinical observations, patient history, and epidemiological information.  Fact Sheet for Patients:   StrictlyIdeas.no  Fact Sheet for Healthcare Providers: BankingDealers.co.za  This test is not yet approved or  cleared by the [place] FDA and has been authorized for detection and/or diagnosis of SARS-CoV-2 by FDA under an Emergency Use Authorization (EUA).  This EUA will remain in effect (meaning this test can be used) for the duration of the COVID-19 declaration under Section 564(b)(1) of the Act, 21 U.S.C. section 360bbb-3(b)(1), unless the authorization is terminated or revoked sooner.  Performed at [facility], [place] [address], [place],  [address]      Labs:  Basic Metabolic Panel: Recent Labs  Lab 03/25/20 0329 03/25/20 0329 03/26/20 0308 03/26/20 0308 03/27/20 0316 03/27/20 1409 03/27/20 2023 03/28/20 0309 03/29/20 0240  NA 123*   < > 122*  --  124* 126*  --  124* 125*  K 5.0   < > 4.6   < > 5.6* 5.6* 5.0 5.4* 5.7*  CL 90*   < > 91*  --  89* 89*  --  86* 86*  CO2 19*   < > 20*  --  22 18*  --  20* 20*  GLUCOSE 89   < > 128*  --  96 121*  --  117* 94  BUN 43*   < > 46*  --  54* 64*  --  73* 98*  CREATININE  1.43*   < > 1.31*  --  1.53* 1.75*  --  2.05* 2.97*  CALCIUM 7.3*   < > 7.4*  --  7.5* 7.9*  --  7.4* 7.3*  MG 3.1*  --  3.3*  --  3.2*  --   --  3.3* 3.5*   < > = values in this interval not displayed.   Liver Function Tests: Recent Labs  Lab 03/25/20 0329 03/26/20 0308 03/27/20 0316 03/28/20 0309 03/29/20 0240  AST 185* 191* 207* 212* 238*  ALT 225* 230* 248* 249* 261*  ALKPHOS 649* 644* 656* 658* 714*  BILITOT 6.6* 8.4* 11.1* 12.8* 14.2*  PROT 6.1* 5.7* 6.0* 5.8* 5.7*  ALBUMIN 2.2* 2.0* 2.1* 2.0* 2.0*   Recent Labs  Lab 03/23/20 1852  LIPASE 35   Recent Labs  Lab 03/23/20 2207 03/27/20 1409 03/28/20 0309  AMMONIA 35 87* 64*   CBC: Recent Labs  Lab 03/23/20 1339 03/23/20 1339 03/23/20 1852 03/24/20 0312 03/25/20 0329 03/26/20 0308 03/27/20 0316 03/28/20 0309 03/29/20 0240  WBC 20.7*  --  16.9*   < > 19.2* 19.6* 23.3* 22.1* 29.1*  NEUTROABS 17.8*  --  14.9*  --   --   --   --   --   --   HGB 13.2  --  12.7*   < > 13.2 12.9* 13.5 14.6 14.5  HCT 38.7*   < > 37.8*   < > 39.0 37.8* 39.4 42.4 41.2  MCV 76.0*   < > 77.9*   < > 78.2* 77.1* 77.0* 76.1* 74.8*  PLT 226  --  199   < > 179 152 146* 141* 126*   < > = values in this interval not displayed.     IMAGING STUDIES DG Chest 2 View  Result Date: 03/20/2020 CLINICAL DATA:  Chest pain and shortness of breath EXAM: CHEST - 2 VIEW COMPARISON:  Mar 01, 2016 FINDINGS: There is bibasilar atelectatic change. The lungs elsewhere are clear. The heart size and pulmonary vascularity are normal. No adenopathy. There is aortic atherosclerosis. Loop recorder present on the left anteriorly. There is degenerative change in the thoracic spine. IMPRESSION: Bibasilar atelectasis. No edema or airspace opacity. Stable cardiac silhouette. Aortic Atherosclerosis (ICD10-I70.0). Electronically Signed   By: Lowella Grip III M.D.   On: 03/20/2020 13:11   DG Chest 2 View  Result Date: 03/01/2020 CLINICAL DATA:  Chest pain EXAM:  CHEST - 2 VIEW COMPARISON:  May 03, 2019 FINDINGS: There is mild bibasilar atelectasis. Lungs otherwise are clear. The heart size and pulmonary vascularity are normal. No adenopathy. There are foci left anterior descending coronary artery calcification. There is aortic atherosclerosis. There is degenerative change in the thoracic spine.  There is a loop recorder on the left anteriorly. IMPRESSION: Bibasilar atelectasis.  Lungs otherwise clear.  Heart size normal. Coronary artery calcification noted. Aortic Atherosclerosis (ICD10-I70.0). Loop recorder on left anteriorly. Electronically Signed   By: Lowella Grip III M.D.   On: 03/01/2020 10:23   CT Angio Chest PE W/Cm &/Or Wo Cm  Result Date: 03/01/2020 CLINICAL DATA:  Shortness of breath. Concern for pulmonary embolism. Positive D-dimer. EXAM: CT ANGIOGRAPHY CHEST WITH CONTRAST TECHNIQUE: Multidetector CT imaging of the chest was performed using the standard protocol during bolus administration of intravenous contrast. Multiplanar CT image reconstructions and MIPs were obtained to evaluate the vascular anatomy. CONTRAST:  86mL OMNIPAQUE IOHEXOL 350 MG/ML SOLN COMPARISON:  None. FINDINGS: Cardiovascular: Contrast injection is sufficient to demonstrate satisfactory opacification of the pulmonary arteries to the segmental level. There is no pulmonary embolus. The main pulmonary artery is within normal limits for size. There is no CT evidence of acute right heart strain. There are atherosclerotic changes of the visualized thoracic aorta. The thoracic aorta is borderline aneurysmal measuring approximately 4 cm in diameter. Heart size is coronary artery calcifications are noted. Mediastinum/Nodes: --there are mildly enlarged mediastinal lymph nodes. For example there is a right paratracheal lymph node measuring approximately 1.5 cm (axial series 5, image 52). --No axillary lymphadenopathy. --No supraclavicular lymphadenopathy. --Normal thyroid gland. --The  esophagus is unremarkable Lungs/Pleura: There is no pneumothorax. Atelectasis is noted at the lung bases. There is no significant pleural effusion. There is a nodule along the major fissure on the left favored to represent a small lymph node. Upper Abdomen: There are innumerable low-attenuation masses throughout the patient's liver. There are few mildly enlarged lymph nodes in the upper abdomen. Musculoskeletal: No chest wall abnormality. No acute or significant osseous findings. Review of the MIP images confirms the above findings. IMPRESSION: 1. No acute pulmonary embolism. 2. There are innumerable masses in the patient's liver concerning for metastatic disease, currently with an unknown primary. 3. There are mildly enlarged mediastinal lymph nodes which may be reactive or metastatic. Attention on follow-up examinations is recommended. 4. Bibasilar atelectasis is noted. Aortic Atherosclerosis (ICD10-I70.0). Electronically Signed   By: Constance Holster M.D.   On: 03/01/2020 19:03   CT ABDOMEN PELVIS W CONTRAST  Result Date: 03/23/2020 CLINICAL DATA:  Abdominal distension, abdominal pain with elevated LFTs with known metastatic disease to the liver. EXAM: CT ABDOMEN AND PELVIS WITH CONTRAST TECHNIQUE: Multidetector CT imaging of the abdomen and pelvis was performed using the standard protocol following bolus administration of intravenous contrast. CONTRAST:  13mL OMNIPAQUE IOHEXOL 300 MG/ML  SOLN COMPARISON:  03/01/2020 FINDINGS: Lower chest: Interval development of RIGHT-sided effusion and basilar airspace disease since the prior study. Calcified coronary artery disease. No pericardial effusion. Heart is incompletely imaged. Minimal atelectasis at the LEFT lung base. Hepatobiliary: Multifocal hepatic metastatic lesions now more confluent and enlarging when compared to the prior study. (Image 26, series 2) 3.5 cm lesion previously 2.9 cm. Near confluent disease in the LEFT hepatic lobe on today's exam. Area  of confluent disease in the RIGHT hepatic lobe (image 39, series 2) 5.0 cm at most 3.7 cm on the prior study and without discrete intervening normal hepatic parenchyma between lesions in this area. Portal vein is patent. Gallbladder is collapsed. Question of lack of enhancement of the medial wall of the gallbladder. In the current context the significance is uncertain the gallbladder could be collapse upon itself. Signs of ascites and small pericholecystic fluid. Portal vein is patent. Pancreas: Pancreas  is normal without ductal dilation or inflammation. Spleen: Spleen normal size and contour. Adrenals/Urinary Tract: Adrenal glands are normal. Symmetric renal enhancement. No hydronephrosis. LEFT renal cyst arises from lateral LEFT kidney as before. Urinary bladder is normal to the extent evaluated aside from LEFT posterolateral bladder diverticulum. Stomach/Bowel: Stomach is under distended. Small bowel without overt signs of obstruction. Mildly dilated distal small bowel loops filled with liquid stool like material. Stool and gas filling the proximal colon. Colonic diverticulosis. No diverticulitis. The appendix is normal. Vascular/Lymphatic: Calcific and noncalcific atheromatous plaque of the abdominal aorta. Mild dilation without aneurysm. 2.9 cm greatest axial dimension. Retroperitoneal adenopathy. (Image 46, series 2) 1.3 cm previously 1 cm short axis. Periportal nodal enlargement is similar. Juxta crural lymph node and hepatic gastric lymph nodes are similar. Reproductive: Prostate is unremarkable by CT. Other: Post LEFT inguinal herniorrhaphy. Ascites which is new from the prior study. No signs of free air. Musculoskeletal: Spinal degenerative changes. No acute or destructive bone process. IMPRESSION: 1. Given new ascites and lack of gallbladder wall enhancement with collapse of the gallbladder since previous imaging studies would suggest focused assessment with gallbladder ultrasound to ensure gallbladder  wall integrity. HIDA scan is likely to be un informative at this time due to hepatic dysfunction. 2. Multifocal hepatic metastatic lesions now more confluent and enlarging when compared to the prior study. 3. New small volume ascites is more likely related to worsening hepatic dysfunction in the setting of extensive hepatic metastatic disease. 4. Interval development of RIGHT-sided effusion and basilar airspace disease since the prior study. 5. Colonic diverticulosis without diverticulitis. 6. Aortic atherosclerosis. Aortic Atherosclerosis (ICD10-I70.0). Electronically Signed   By: Zetta Bills M.D.   On: 03/23/2020 20:17   CT ABDOMEN PELVIS W CONTRAST  Result Date: 03/01/2020 CLINICAL DATA:  Chest CTA with findings suspicious for hepatic metastatic disease. EXAM: CT ABDOMEN AND PELVIS WITH CONTRAST TECHNIQUE: Multidetector CT imaging of the abdomen and pelvis was performed using the standard protocol following bolus administration of intravenous contrast. CONTRAST:  79mL OMNIPAQUE IOHEXOL 300 MG/ML  SOLN COMPARISON:  Chest CTA earlier today. Abdominopelvic CT 08/17/2018 FINDINGS: Lower chest: Assessed on chest CT earlier today. Right greater than left basilar atelectasis. Pleural thickening without significant effusion. Hepatobiliary: Innumerable low-density lesions scattered throughout the liver parenchyma most consistent with diffuse hepatic metastatic disease. Majority of the lesions are 1-2 cm. Largest lesion in the right hepatic dome measures 2.4 cm. Gallbladder physiologically distended, no calcified stone. No biliary dilatation. Pancreas: No ductal dilatation or inflammation. No evidence of pancreatic mass. Spleen: Normal in size without focal abnormality. Adrenals/Urinary Tract: Mild thickening of the lateral left adrenal gland is unchanged from prior exam. The right adrenal gland is normal. There is no suspicious adrenal nodule. No hydronephrosis. There is symmetric bilateral perinephric edema. There  is a 3.3 cm exophytic low-density lesion from the posterior left kidney that likely represents a minimally complex cyst. This is not significantly changed in size from prior exam. Excreted IV contrast within both renal collecting systems which limits assessment for stone. Probable parapelvic cyst in the left kidney. There is excreted IV contrast within the urinary bladder without evidence of bladder wall thickening or focal lesion. Stomach/Bowel: Detailed bowel evaluation is limited in the absence of enteric contrast. Mild wall thickening of the distal esophagus. Unopacified stomach is unremarkable. Normal positioning of the ligament of Treitz. Small duodenal diverticulum. No small bowel inflammation or obstruction. No obvious small bowel mass. Normal appendix. Moderate volume of stool throughout the colon. Diverticulosis  from the splenic flexure distally. No diverticulitis. Few areas of nondistention involving the sigmoid colon likely related to peristalsis, no obvious colonic mass. Vascular/Lymphatic: There is a 12 mm portal caval node, series 3, image 24. Suspected 14 mm retrocrural node adjacent to the distal esophagus, series 3, image 15. clustered prominent nodes adjacent to the celiac axis measuring up to 10 mm, series 3, image 22. 8 mm SMA node, series 3, image 28. There is left periaortic retroperitoneal adenopathy, with nodes measuring up to 13 mm, series 3, image 37. No definite enlarged pelvic lymph nodes. Ectatic infrarenal aorta measuring 2.7 cm. Aortic atherosclerosis. The portal vein is patent. Reproductive: Prostate is unremarkable. Other: Trace fluid adjacent to the inferior liver tip and edema in the right pericolic gutter. No significant ascites. No omental thickening or nodularity. No free air. Prior left inguinal hernia repair. There is fat in the right inguinal canal. Musculoskeletal: No blastic osseous lesion. No evidence of destructive lytic lesion. Multilevel degenerative change in the  spine. No suspicious subcutaneous or abdominal wall lesion. IMPRESSION: 1. Innumerable low-density lesions scattered throughout the liver parenchyma most consistent with diffuse hepatic metastatic disease. 2. Retroperitoneal and upper abdominal adenopathy, suspicious for metastatic disease. 3. Primary malignancy is not definitively characterized, however there is wall thickening of the distal esophagus which raises concern for occult esophageal primary. Recommend further evaluation with endoscopy. 4. Colonic diverticulosis without diverticulitis. Areas of nondistention of the sigmoid colon without dominant colonic mass. 5. Ectatic abdominal aorta at risk for aneurysm development, maximal dimension 2.7 cm. Recommend followup by ultrasound in 5 years. This recommendation follows ACR consensus guidelines: White Paper of the ACR Incidental Findings Committee II on Vascular Findings. J Am Coll Radiol 2013; 10:789-794. Aortic aneurysm NOS (ICD10-I71.9) Aortic Atherosclerosis (ICD10-I70.0). Electronically Signed   By: Keith Rake M.D.   On: 03/01/2020 21:30   MR 3D Recon At Scanner  Result Date: 03/27/2020 CLINICAL DATA:  Jaundice extensive liver metastases from unknown primary EXAM: MRI ABDOMEN WITHOUT AND WITH CONTRAST (INCLUDING MRCP) TECHNIQUE: Multiplanar multisequence MR imaging of the abdomen was performed both before and after the administration of intravenous contrast. Heavily T2-weighted images of the biliary and pancreatic ducts were obtained, and three-dimensional MRCP images were rendered by post processing. CONTRAST:  27mL GADAVIST GADOBUTROL 1 MMOL/ML IV SOLN COMPARISON:  CT abdomen and pelvis of March 23, 2020 FINDINGS: Lower chest: RIGHT-sided pleural effusion similar to previous evaluation. Is basilar airspace disease. Similar to prior study. Mediastinal adenopathy better displayed on a recent CT angiogram of the chest. Hepatobiliary: Marked worsening of hepatic metastatic disease as on the recent  CT of the abdomen and pelvis near confluent in the LEFT hepatic lobe. Gallbladder is decompressed otherwise unremarkable. Areas of segmental biliary ductal distension in the RIGHT hepatic lobe seen to anterior inferior RIGHT hemi liver and anterior superior RIGHT hemi liver likely due to local mass effect. Common duct is nondilated as are RIGHT and LEFT hepatic ducts. Gallbladder is collapsed upon itself likely accounting for the appearance on the previous imaging study. Portal vein is patent. Pancreas: No mass, inflammatory changes, or other parenchymal abnormality identified. Spleen:  Normal size without suspicious focal lesion. Adrenals/Urinary Tract: Adrenal glands are normal. LEFT renal cyst. No hydronephrosis. Stomach/Bowel: Limited assessment of the gastrointestinal tract with moderate colonic distension in the upper abdomen nearly 7 cm. Vascular/Lymphatic: Atheromatous plaque of the abdominal aorta, no aneurysmal dilation. Adenopathy in the retroperitoneum and upper abdomen is similar to the previous study. Other:  Ascites better demonstrated on  CT of the abdomen and pelvis. Musculoskeletal: No suspicious bone lesions identified. IMPRESSION: 1. Marked worsening of hepatic metastatic disease as on the recent CT of the abdomen and pelvis near confluent in the LEFT hepatic lobe. 2. Areas of segmental biliary ductal distension in the RIGHT hepatic lobe seen to anterior inferior RIGHT hemi liver and anterior superior RIGHT hemi liver likely due to local mass effect. Scattered small peripheral biliary radicles throughout the liver in other areas with less dilation, essentially mild peripheral dilation elsewhere. This is likely due to local areas of mass effect in the setting of worsening of hepatic metastatic disease which may also explain the degree of hepatic dysfunction. 3. Collapsed gallbladder 4. Common duct is nondilated as are RIGHT and LEFT hepatic ducts. 5. Adenopathy in the retroperitoneum and upper  abdomen is similar to the previous study. Also on some series adenopathy in the chest is partially imaged, better seen on recent chest imaging from May of 2021 but likely enlarged since that time. 6. Limited assessment of the gastrointestinal tract with moderate colonic distension in the upper abdomen nearly 7 cm. Correlate with any clinical signs of developing colonic ileus. 7. RIGHT-sided pleural effusion similar to previous evaluation. 8. Aortic atherosclerosis. Aortic Atherosclerosis (ICD10-I70.0). Electronically Signed   By: Zetta Bills M.D.   On: 03/27/2020 15:35   US BIOPSY (LIVER)  Result Date: 03/24/2020 INDICATION: No known primary, now with multiple infiltrative liver lesions worrisome for metastatic disease. Please perform ultrasound-guided liver lesion biopsy for tissue diagnostic purposes. EXAM: ULTRASOUND GUIDED LIVER LESION BIOPSY COMPARISON:  CT abdomen pelvis-03/23/2020; 03/01/2020 MEDICATIONS: None ANESTHESIA/SEDATION: Fentanyl 100 mcg IV; Versed 2 mg IV Total Moderate Sedation time:  10 Minutes. The patient's level of consciousness and vital signs were monitored continuously by radiology nursing throughout the procedure under my direct supervision. COMPLICATIONS: None immediate. PROCEDURE: Informed written consent was obtained from the patient after a discussion of the risks, benefits and alternatives to treatment. The patient understands and consents the procedure. A timeout was performed prior to the initiation of the procedure. Ultrasound scanning was performed of the right upper abdominal quadrant demonstrates multiple peripherally hypoechoic nodules and masses scattered throughout the liver compatible with findings seen on preceding noncontrast abdominal CT. A dominant approximately 2.4 x 2.2 cm hypoechoic nodule within the subcapsular aspect the right lobe of the liver, likely correlating with the dominant lesion seen on preceding abdominal CT image 26, series 2, was targeted for  biopsy given lesion location and sonographic window (image 11). The procedure was planned. The right upper abdominal quadrant was prepped and draped in the usual sterile fashion. The overlying soft tissues were anesthetized with 1% lidocaine with epinephrine. A 17 gauge, 6.8 cm co-axial needle was advanced into a peripheral aspect of the lesion. This was followed by 6 core biopsies with an 18 gauge core device under direct ultrasound guidance. The coaxial needle tract was embolized with a small amount of Gel-Foam slurry and superficial hemostasis was obtained with manual compression. Post procedural scanning was negative for definitive area of hemorrhage or additional complication. A dressing was placed. The patient tolerated the procedure well without immediate post procedural complication. IMPRESSION: Technically successful ultrasound guided core needle biopsy of indeterminate lesion within the right lobe of the liver. Electronically Signed   By: Sandi Mariscal M.D.   On: 03/24/2020 15:27   DG CHEST PORT 1 VIEW  Result Date: 03/26/2020 CLINICAL DATA:  Dyspnea. EXAM: PORTABLE CHEST 1 VIEW COMPARISON:  03/20/2020 FINDINGS: Loop recorder projects over  the left heart. Lungs are hypoinflated with mild stable elevation of the right hemidiaphragm. Possible mild linear atelectasis left base. No evidence of effusion. Cardiomediastinal silhouette and remainder the exam is unchanged. IMPRESSION: Hypoinflation without acute cardiopulmonary disease. Minimal linear atelectasis left base. Stable elevation right hemidiaphragm. Electronically Signed   By: Marin Olp M.D.   On: 03/26/2020 13:30   MR ABDOMEN MRCP W WO CONTAST  Result Date: 03/27/2020 CLINICAL DATA:  Jaundice extensive liver metastases from unknown primary EXAM: MRI ABDOMEN WITHOUT AND WITH CONTRAST (INCLUDING MRCP) TECHNIQUE: Multiplanar multisequence MR imaging of the abdomen was performed both before and after the administration of intravenous contrast.  Heavily T2-weighted images of the biliary and pancreatic ducts were obtained, and three-dimensional MRCP images were rendered by post processing. CONTRAST:  62mL GADAVIST GADOBUTROL 1 MMOL/ML IV SOLN COMPARISON:  CT abdomen and pelvis of March 23, 2020 FINDINGS: Lower chest: RIGHT-sided pleural effusion similar to previous evaluation. Is basilar airspace disease. Similar to prior study. Mediastinal adenopathy better displayed on a recent CT angiogram of the chest. Hepatobiliary: Marked worsening of hepatic metastatic disease as on the recent CT of the abdomen and pelvis near confluent in the LEFT hepatic lobe. Gallbladder is decompressed otherwise unremarkable. Areas of segmental biliary ductal distension in the RIGHT hepatic lobe seen to anterior inferior RIGHT hemi liver and anterior superior RIGHT hemi liver likely due to local mass effect. Common duct is nondilated as are RIGHT and LEFT hepatic ducts. Gallbladder is collapsed upon itself likely accounting for the appearance on the previous imaging study. Portal vein is patent. Pancreas: No mass, inflammatory changes, or other parenchymal abnormality identified. Spleen:  Normal size without suspicious focal lesion. Adrenals/Urinary Tract: Adrenal glands are normal. LEFT renal cyst. No hydronephrosis. Stomach/Bowel: Limited assessment of the gastrointestinal tract with moderate colonic distension in the upper abdomen nearly 7 cm. Vascular/Lymphatic: Atheromatous plaque of the abdominal aorta, no aneurysmal dilation. Adenopathy in the retroperitoneum and upper abdomen is similar to the previous study. Other:  Ascites better demonstrated on CT of the abdomen and pelvis. Musculoskeletal: No suspicious bone lesions identified. IMPRESSION: 1. Marked worsening of hepatic metastatic disease as on the recent CT of the abdomen and pelvis near confluent in the LEFT hepatic lobe. 2. Areas of segmental biliary ductal distension in the RIGHT hepatic lobe seen to anterior  inferior RIGHT hemi liver and anterior superior RIGHT hemi liver likely due to local mass effect. Scattered small peripheral biliary radicles throughout the liver in other areas with less dilation, essentially mild peripheral dilation elsewhere. This is likely due to local areas of mass effect in the setting of worsening of hepatic metastatic disease which may also explain the degree of hepatic dysfunction. 3. Collapsed gallbladder 4. Common duct is nondilated as are RIGHT and LEFT hepatic ducts. 5. Adenopathy in the retroperitoneum and upper abdomen is similar to the previous study. Also on some series adenopathy in the chest is partially imaged, better seen on recent chest imaging from May of 2021 but likely enlarged since that time. 6. Limited assessment of the gastrointestinal tract with moderate colonic distension in the upper abdomen nearly 7 cm. Correlate with any clinical signs of developing colonic ileus. 7. RIGHT-sided pleural effusion similar to previous evaluation. 8. Aortic atherosclerosis. Aortic Atherosclerosis (ICD10-I70.0). Electronically Signed   By: Zetta Bills M.D.   On: 03/27/2020 15:35   CUP PACEART REMOTE DEVICE CHECK  Result Date: 03/27/2020 [facility] summary report received. Battery status OK. Normal device function. No new symptom episodes, tachy episodes,  brady, or pause episodes. No new AF episodes. Monthly summary reports and ROV/PRN. Felisa Bonier, RN, MSN  US Abdomen Limited RUQ  Result Date: 03/23/2020 CLINICAL DATA:  Hepatic metastatic disease, abnormal gallbladder on CT EXAM: ULTRASOUND ABDOMEN LIMITED RIGHT UPPER QUADRANT COMPARISON:  03/23/2020 FINDINGS: Gallbladder: Not well visualized. The gallbladder was completely decompressed on preceding CT. Common bile duct: Diameter: 5 mm Liver: Numerous hypoechoic masses are seen throughout the liver parenchyma consistent with known metastatic disease. No intrahepatic duct dilation. Portal vein is patent on color Doppler imaging  with normal direction of blood flow towards the liver. Other: None. IMPRESSION: 1. Diffuse liver metastases. 2. Nonvisualization of the gallbladder due to decompressed state. Electronically Signed   By: Randa Ngo M.D.   On: 03/23/2020 22:19    DISCHARGE EXAMINATION: See progress note from earlier today  DISPOSITION: Residential hospice   Discharge Instructions    Discharge instructions   Complete by: As directed    You were cared for by a hospitalist during your hospital stay. If you have any questions about your discharge medications or the care you received while you were in the hospital after you are discharged, you can call the unit and asked to speak with the hospitalist on call if the hospitalist that took care of you is not available. Once you are discharged, your primary care physician will handle any further medical issues. Please note that NO REFILLS for any discharge medications will be authorized once you are discharged, as it is imperative that you return to your primary care physician (or establish a relationship with a primary care physician if you do not have one) for your aftercare needs so that they can reassess your need for medications and monitor your lab values. If you do not have a primary care physician, you can call [phone] for a physician referral.   Increase activity slowly   Complete by: As directed    No wound care   Complete by: As directed         Allergies as of 03/29/2020      Reactions   Erythromycin Base Other (See Comments)   REACTION: stomach irritation, patient is unaware of this allergy      Medication List    STOP taking these medications   amLODipine 10 MG tablet Commonly known as: NORVASC   atorvastatin 40 MG tablet Commonly known as: LIPITOR   atorvastatin 80 MG tablet Commonly known as: LIPITOR   azelastine 0.1 % nasal spray Commonly known as: ASTELIN   azithromycin 250 MG tablet Commonly known as: Zithromax Z-Pak     chlorpheniramine-HYDROcodone 10-8 MG/5ML Suer Commonly known as: TUSSIONEX   fexofenadine 60 MG tablet Commonly known as: ALLEGRA   montelukast 10 MG tablet Commonly known as: SINGULAIR   pantoprazole 40 MG tablet Commonly known as: PROTONIX   predniSONE 10 MG tablet Commonly known as: DELTASONE   spironolactone 25 MG tablet Commonly known as: ALDACTONE     TAKE these medications   albuterol 108 (90 Base) MCG/ACT inhaler Commonly known as: VENTOLIN HFA Inhale 1 puff into the lungs every 4 (four) hours as needed for wheezing or shortness of breath. Use as directed   aspirin 81 MG EC tablet Take 1 tablet (81 mg total) by mouth daily.   bisacodyl 5 MG EC tablet Commonly known as: DULCOLAX Take 1 tablet (5 mg total) by mouth daily as needed for moderate constipation.   clopidogrel 75 MG tablet Commonly known as: PLAVIX Take 1 tablet (75  mg total) by mouth daily. What changed: when to take this   diphenhydrAMINE 25 mg capsule Commonly known as: BENADRYL Take 1 capsule (25 mg total) by mouth every 6 (six) hours as needed for itching.   Fluticasone-Salmeterol 232-14 MCG/ACT Aepb Inhale 1 puff into the lungs 2 (two) times daily.   HYDROmorphone 1 MG/ML injection Commonly known as: DILAUDID Inject 1-2 mLs (1-2 mg total) into the vein every 2 (two) hours as needed (breakthrough pain).   lactulose 10 GM/15ML solution Commonly known as: CHRONULAC Take 30 mLs (20 g total) by mouth 3 (three) times daily.   nitroGLYCERIN 0.4 MG SL tablet Commonly known as: NITROSTAT Place 1 tablet (0.4 mg total) under the tongue every 5 (five) minutes as needed for chest pain.   Oxycodone HCl 10 MG Tabs Take 1 tablet (10 mg total) by mouth every 4 (four) hours as needed for severe pain. What changed:   medication strength  how much to take   senna-docusate 8.6-50 MG tablet Commonly known as: Senokot-S Take 2 tablets by mouth at bedtime as needed for mild constipation or moderate  constipation.         Follow-up Information    Ladell Pier, MD Follow up.   Specialty: Oncology Contact information: [place] [address] [phone]               TOTAL DISCHARGE TIME: 47 minutes  [facility] Pager on [email or web address]  03/29/2020, 1:52 PM

## 2020-03-29 NOTE — Progress Notes (Signed)
Report called to [facility]. Bethann Punches RN

## 2020-03-29 NOTE — Discharge Instructions (Signed)
Hospice Hospice is a service that is designed to provide people who are terminally ill and their families with medical, spiritual, and psychological support. Its aim is to improve your quality of life by keeping you as comfortable as possible in the final stages of life. Who will be my providers when I begin hospice care? Hospice teams often include:  A nurse.  A doctor. The hospice doctor will be available for your care, but you can include your regular doctor or nurse practitioner.  A social worker.  A counselor.  A religious leader (such as a chaplain).  A dietitian.  Therapists.  Trained volunteers who can help with care. What services does hospice provide? Hospice services can vary depending on the center or organization. Generally, they include:  Ways to keep you comfortable, such as: ? Providing care in your home or in a home-like setting. ? Working with your family and friends to help meet your needs. ? Allowing you to enjoy the support of loved ones by receiving much of your basic care from family and friends.  Pain relief and symptom management. The staff will supply all necessary medicines and equipment so that you can stay comfortable and alert enough to enjoy the company of your friends and family.  Visits or care from a nurse and doctor. This may include 24-hour on-call services.  Companionship when you are alone.  Allowing you and your family to rest. Hospice staff may do light housekeeping, prepare meals, and run errands.  Counseling. They will make sure your emotional, spiritual, and social needs are being met, as well as those needs of your family members.  Spiritual care. This will be individualized to meet your needs and your family's needs. It may involve: ? Helping you and your family understand the dying process. ? Helping you say goodbye to your family and friends. ? Performing a specific religious ceremony or ritual.  Massage.  Nutrition  therapy.  Physical and occupational therapy.  Short-term inpatient care, if something cannot be managed in the home.  Art or music therapy.  Bereavement support for grieving family members. When should hospice care begin? Most people who use hospice are believed to have less than 6 months to live.  Your family and health care providers can help you decide when hospice services should begin.  If you live longer than 6 months but your condition does not improve, your doctor may be able to approve you for continued hospice care.  If your condition improves, you may discontinue the program. What should I consider before selecting a program? Most hospice programs are run by nonprofit, independent organizations. Some are affiliated with hospitals, nursing homes, or home health care agencies. Hospice programs can take place in your home or at a hospice center, hospital, or skilled nursing facility. When choosing a hospice program, ask the following questions:  What services are available to me?  What services will be offered to my loved ones?  How involved will my loved ones be?  How involved will my health care provider be?  Who makes up the hospice care team? How are they trained or screened?  How will my pain and symptoms be managed?  If my circumstances change, can the services be provided in a different setting, such as my home or in the hospital?  Is the program reviewed and licensed by the state or certified in some other way?  What does it cost? Is it covered by insurance?  If I choose a hospice   center or nursing home, where is the hospice center located? Is it convenient for family and friends?  If I choose a hospice center or nursing home, can my family and friends visit any time?  Will you provide emotional and spiritual support?  Who can my family call with questions? Where can I learn more about hospice? You can learn about existing hospice programs in your area  from your health care providers. You can also read more about hospice online. The websites of the following organizations have helpful information:  [facility] and Palliative Care Organization [facility]): http://www.brown-buchanan.com/  National Association for [facility]): http://massey-hart.com/  Hospice Foundation of America ([place]): www.hospicefoundation.org  American Cancer Society (ACS): www.cancer.org  Hospice Net: www.hospicenet.org  Visiting Nurse Associations of [place] (VNAA): www.vnaa.org You may also find more information by contacting the following agencies:  A local agency on aging.  Your local [employer] chapter.  Your state's department of health or social services. Summary  Hospice is a service that is designed to provide people who are terminally ill and their families with medical, spiritual, and psychological support.  Hospice aims to improve your quality of life by keeping you as comfortable as possible in the final stages of life.  Hospice teams often include a doctor, nurse, social worker, counselor, religious leader,dietitian, therapists, and volunteers.  Hospice care generally includes medicine for symptom management, visits from doctors and nurses, physical and occupational therapy, nutrition counseling, spiritual and emotional counseling, caregiver support, and bereavement support for grieving family members.  Hospice programs can take place in your home or at a hospice center, hospital, or skilled nursing facility. This information is not intended to replace advice given to you by your health care provider. Make sure you discuss any questions you have with your health care provider. Document Revised: 06/16/2019 Document Reviewed: 10/15/2016 Elsevier Patient Education  [place].

## 2020-03-29 NOTE — TOC Initial Note (Signed)
Transition of Care [facility]) - Initial/Assessment Note    Patient Details  Name: Charles Mccoy MRN: [id number] Date of Birth: 04-Jun-1951  Transition of Care [facility]) CM/SW Contact:    Lia Hopping, [place] Phone Number: 03/29/2020, 10:24 AM  Clinical Narrative:                 CSW met with the patient daughter at bedside and offered support. CSW made referral to residential [place]. [facility] representative Audrea Muscat  will follow up with the patient spouse and notify csw if/ when a bed is available.  CSW will continue to follow this patient for discharge needs.   Expected Discharge Plan: [place] Barriers to Discharge: No Barriers Identified   Patient Goals and CMS Choice     Choice offered to / list presented to : Adult Children, Spouse  Expected Discharge Plan and Services Expected Discharge Plan: [place] arrangements for the past 2 months: Single Family Home                 DME Arranged: N/A DME Agency: NA       HH Arranged: NA [place] Agency: NA        Prior Living Arrangements/Services Living arrangements for the past 2 months: [place] Lives with:: Spouse Patient language and need for interpreter reviewed:: No Do you feel safe going back to the place where you live?: No   End of [place] Care  Need for Family Participation in Patient Care: Yes (Comment) Care giver support system in place?: Yes (comment)   Criminal Activity/Legal Involvement Pertinent to Current Situation/Hospitalization: No - Comment as needed  Activities of Daily Living Home Assistive Devices/Equipment: Eyeglasses ADL Screening (condition at time of admission) Patient's cognitive ability adequate to safely complete daily activities?: No Is the patient deaf or have difficulty hearing?: No Does the patient have difficulty seeing, even when wearing glasses/contacts?: No Does the patient have difficulty concentrating,  remembering, or making decisions?: Yes Patient able to express need for assistance with ADLs?: Yes Does the patient have difficulty dressing or bathing?: No Independently performs ADLs?: No Communication: Independent Dressing (OT): Independent Grooming: Independent Feeding: Independent Bathing: Independent Toileting: Needs assistance Is this a change from baseline?: Pre-admission baseline In/Out Bed: Needs assistance Is this a change from baseline?: Pre-admission baseline Walks in Home: Needs assistance Is this a change from baseline?: Pre-admission baseline Does the patient have difficulty walking or climbing stairs?: Yes Weakness of Legs: None Weakness of Arms/Hands: None  Permission Sought/Granted Permission sought to share information with : Case Manager       Permission granted to share info w AGENCY: International aid/development worker granted to share info w Relationship: Spouse/Daughter     Emotional Assessment Appearance:: Appears stated age Attitude/Demeanor/Rapport: Unable to Assess Affect (typically observed): Unable to Assess   Alcohol / Substance Use: Not Applicable Psych Involvement: No (comment)  Admission diagnosis:  Hyperbilirubinemia [E80.6] Liver lesion [K76.9] Patient Active Problem List   Diagnosis Date Noted  . General weakness   . Generalized pain   . Liver metastases ([place])   . Hyperbilirubinemia 03/23/2020  . History of CVA (cerebrovascular accident) 03/23/2020  . Hyponatremia 03/23/2020  . Transaminitis 03/23/2020  . Liver lesion 03/23/2020  . CAD (coronary artery disease) 05/05/2019  . NSTEMI (non-ST elevated myocardial infarction) ([place]) 05/03/2019  . Pulmonary nodules/lesions, multiple 04/29/2019  . Right carpal tunnel syndrome 10/15/2018  . Left inguinal hernia 10/15/2018  .  Cerebrovascular accident (CVA) (HCC)   . Hyperlipidemia 01/20/2018  . BPH (benign prostatic hyperplasia) 01/20/2018  . COPD (chronic obstructive pulmonary disease) (HCC)  01/20/2018  . 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 Suspected stroke patient last known to be well 2 to 3 hours ago 01/18/2018  . Primary osteoarthritis of right knee 05/30/2016  . Right knee DJD 05/30/2016  . GERD (gastroesophageal reflux disease) 07/28/2014  . Left knee DJD 07/21/2014  . Osteoarthritis 10/09/2009  . Asthma 08/17/2008  . SINUSITIS 09/15/2007  . Essential hypertension 06/19/2007  . Allergic rhinitis 06/19/2007   PCP:  Hernandez Acosta, Estela Y, MD Pharmacy:   [id number] - [place], [place] - [address] [place] [address] Phone: [phone] Fax: [phone]  [facility] [place] - [address] [place] [address] Phone: [phone] Fax: [phone]     Social Determinants of Health (SDOH) Interventions    Readmission Risk Interventions No flowsheet data found.  

## 2020-03-29 NOTE — Progress Notes (Signed)
PROGRESS NOTE    Charles Mccoy   KXF:[id number]  DOB: [birth year]  PCP: [name], [name], MD    DOA: 03/23/2020 LOS: 6   Brief Narrative   69 year old with history of COPD, CVA, CAD status post stent, HLD, HTN, recent finding of liver lesions sent to the hospital by oncology for transaminitis and hyperbilirubinemia.  Upon admission had lactic acidosis, hyponatremia, 1.66, BUN 45, transaminitis, elevated total bilirubin.  CT abdomen pelvis showed ascites, multifocal liver lesions.   Assessment & Plan   Diffuse liver metastases of unknown primary with transaminitis and hyperbilirubinemia Patient was seen by gastroenterology and patient underwent liver biopsy on 6/18. Oncology was also consulted. CT of the abdomen pelvis showed small volume ascites. Palliative care was also consulted. Patient underwent MRCP which showed significant tumor burden but no biliary strictures. Patient noted to have elevated CA 19-9 as well as CEA.  Pathology shows adenocarcinoma.  Could be cholangiocarcinoma although a specific tumor site has not been identified on imaging studies.  He could also have another occult GI primary.  Oncology reviewed these findings with the patient and his family.  Plan is for residential hospice placement.  Pain is adequately controlled at this time.  Discussed with daughter who was at the bedside.    Acute hepatic encephalopathy/coagulopathy/liver failure Patient noted to have elevated ammonia level along with elevated PT/INR. He has been given vitamin K. He was also started on lactulose .  Ammonia level went from 87 to 64.  Continue current dose for now.  Mentation seems to be stable.  No focal deficits.    Acute kidney injury/hyperkalemia Patient's renal function continues to worsen.  He was given a dose of Lasix yesterday due to peripheral edema.  He has not made much urine.  Potassium remains elevated.  Give him another dose of Lokelma.  Now that the plan is for  residential hospice we will not be too aggressive.  His prognosis is very poor.  Hold off on further doses of Lasix.  His Aldactone was also discontinued a few days ago.    Abdominal pain Secondary to liver mets.  Continue as needed pain medication and bowel regimen.   Palliative care is following.  Hyponatremia Suspect hypervolemic hyponatremia in setting of liver disease as pt with distended abdomen and peripheral edema vs SIADH in setting of malignancy.  Urine electrolytes on admission: Na 31, K 31 (pt takes Aldactone).  Urine osmolality is 407 with a serum osmolality of 283.  Most likely SIADH.  Patient was given diuretics with worsening in his renal function.  We will hold off on further doses for now.  Mild COPD exacerbation with Cough Seems to be stable. Stop steroids.  Leukocytosis WBC continues to climb.  Probably related to his metastatic cancer.  No obvious source of infection.  Prognosis is poor.    Generalized weakness Due to above.   Essential hypertension  Amlodipine on hold due to borderline low blood pressures.   History of CAD s/p stent Patient on aspirin Plavix statin.  Plavix was held for the liver biopsy.  Now resumed.  Cardiac cath July 2020-severe LAD stenosis status post DES.  Initially on aspirin and Brilinta then changed to Plavix.  History of CVA, 2008 On aspirin and Plavix   Patient BMI: Body mass index is 28.96 kg/m.   DVT prophylaxis: SCDs Start: 03/23/20 2126   Diet:  Diet Orders (From admission, onward)    Start     Ordered   03/24/20 1352  Diet regular Room service appropriate? Yes; Fluid consistency: Thin  Diet effective now       Comments: NPO x 1 hr upon arrival to short stay unit or hospital room, than advance as tolerated to pre procedural diet.  Question Answer Comment  Room service appropriate? Yes   Fluid consistency: Thin      03/24/20 1351            Code Status: DNR   Family Communication:  Discussed with  wife.  Subjective 03/29/20    Patient remains somnolent though easily arousable.  Noted to be somewhat distracted.  His daughter is at the bedside.  Slept reasonably well during the night.  Not in severe pain at this time.   Disposition Plan & Communication   Status is: Inpatient  Remains inpatient appropriate because:Inpatient level of care appropriate due to severity of illness   Dispo: The patient is from: Home              Anticipated d/c is to: Residential hospice when bed is available              Anticipated d/c date is: 1 to 2 days              Patient currently is medically stable to d/c to residential hospice   Family Communication: Discussed with his daughter   Consults, Procedures, Significant Events   Consultants:   Oncology  Gastroenterology  Palliative Care  Procedures:   Ultrasound-guided liver biopsy on 03/24/20   Objective   Vitals:   03/28/20 1100 03/28/20 1849 03/29/20 0637 03/29/20 0835  BP:  110/68 123/75   Pulse:  77 87   Resp:  18    Temp:  97.6 F (36.4 C)    TempSrc:      SpO2:  92% 94% 94%  Weight: 106.5 kg     Height:        Intake/Output Summary (Last 24 hours) at 03/29/2020 1046 Last data filed at 03/29/2020 0900 Gross per 24 hour  Intake 120 ml  Output 590 ml  Net -470 ml   Filed Weights   03/23/20 1611 03/24/20 0325 03/28/20 1100  Weight: 107.5 kg 107.5 kg 106.5 kg    Physical Exam:  General appearance: Somnolent but easily arousable.  In no distress. Resp: Diminished air entry at the bases with few crackles.  No wheezing or rhonchi. Cardio: S1-S2 is normal regular.  No S3-S4.  No rubs murmurs or bruit GI: Abdomen is soft.  Distended.  Tender in the right upper quadrant without any rebound rigidity or guarding.  Extremities: Edema bilateral lower extremities Neurologic: Disoriented.  No obvious focal neurological deficits.   Labs   Data Reviewed: I have personally reviewed following labs and imaging  studies  CBC: Recent Labs  Lab 03/23/20 1339 03/23/20 1339 03/23/20 1852 03/24/20 0312 03/25/20 0329 03/26/20 0308 03/27/20 0316 03/28/20 0309 03/29/20 0240  WBC 20.7*  --  16.9*   < > 19.2* 19.6* 23.3* 22.1* 29.1*  NEUTROABS 17.8*  --  14.9*  --   --   --   --   --   --   HGB 13.2  --  12.7*   < > 13.2 12.9* 13.5 14.6 14.5  HCT 38.7*   < > 37.8*   < > 39.0 37.8* 39.4 42.4 41.2  MCV 76.0*   < > 77.9*   < > 78.2* 77.1* 77.0* 76.1* 74.8*  PLT 226  --  199   < >  179 152 146* 141* 126*   < > = values in this interval not displayed.   Basic Metabolic Panel: Recent Labs  Lab 03/25/20 0329 03/25/20 0329 03/26/20 0308 03/26/20 0308 03/27/20 0316 03/27/20 1409 03/27/20 2023 03/28/20 0309 03/29/20 0240  NA 123*   < > 122*  --  124* 126*  --  124* 125*  K 5.0   < > 4.6   < > 5.6* 5.6* 5.0 5.4* 5.7*  CL 90*   < > 91*  --  89* 89*  --  86* 86*  CO2 19*   < > 20*  --  22 18*  --  20* 20*  GLUCOSE 89   < > 128*  --  96 121*  --  117* 94  BUN 43*   < > 46*  --  54* 64*  --  73* 98*  CREATININE 1.43*   < > 1.31*  --  1.53* 1.75*  --  2.05* 2.97*  CALCIUM 7.3*   < > 7.4*  --  7.5* 7.9*  --  7.4* 7.3*  MG 3.1*  --  3.3*  --  3.2*  --   --  3.3* 3.5*   < > = values in this interval not displayed.   GFR: Estimated Creatinine Clearance: 32.1 mL/min (A) (by C-G formula based on SCr of 2.97 mg/dL (H)). Liver Function Tests: Recent Labs  Lab 03/25/20 0329 03/26/20 0308 03/27/20 0316 03/28/20 0309 03/29/20 0240  AST 185* 191* 207* 212* 238*  ALT 225* 230* 248* 249* 261*  ALKPHOS 649* 644* 656* 658* 714*  BILITOT 6.6* 8.4* 11.1* 12.8* 14.2*  PROT 6.1* 5.7* 6.0* 5.8* 5.7*  ALBUMIN 2.2* 2.0* 2.1* 2.0* 2.0*   Recent Labs  Lab 03/23/20 1852  LIPASE 35   Recent Labs  Lab 03/23/20 2207 03/27/20 1409 03/28/20 0309  AMMONIA 35 87* 64*   Coagulation Profile: Recent Labs  Lab 03/24/20 0823 03/25/20 0329 03/27/20 1005 03/28/20 0309 03/29/20 0240  INR 1.6* 1.6* 1.7* 1.9*  1.8*   Sepsis Labs: Recent Labs  Lab 03/23/20 1853 03/23/20 2300  LATICACIDVEN 3.6* 3.6*    Recent Results (from the past 240 hour(s))  Blood culture (routine x 2)     Status: None   Collection Time: 03/23/20  6:53 PM   Specimen: BLOOD  Result Value Ref Range Status   Specimen Description   Final    BLOOD LEFT ANTECUBITAL Performed at [facility], [place] [address], [place], [place] [address]    Special Requests   Final    BOTTLES DRAWN AEROBIC AND ANAEROBIC Blood Culture adequate volume Performed at [facility] [address], [place], [place] [address]    Culture   Final    NO GROWTH 5 DAYS Performed at [facility], [place] [address] [place], [place] [address]    Report Status 03/28/2020 FINAL  Final  Blood culture (routine x 2)     Status: None   Collection Time: 03/23/20  6:53 PM   Specimen: BLOOD RIGHT FOREARM  Result Value Ref Range Status   Specimen Description   Final    BLOOD RIGHT FOREARM Performed at [place] [address], [place], [place] [address]    Special Requests   Final    BOTTLES DRAWN AEROBIC AND ANAEROBIC Blood Culture adequate volume Performed at [place] [address] [place],  [address]    Culture   Final  NO GROWTH 5 DAYS Performed at [facility], [place] [address], [place], [place] [address]    Report Status 03/28/2020 FINAL  Final  SARS Coronavirus 2 by RT PCR (hospital order, performed in [facility] hospital lab) Nasopharyngeal Nasopharyngeal Swab     Status: None   Collection Time: 03/24/20 10:42 AM   Specimen: Nasopharyngeal Swab  Result Value Ref Range Status   SARS Coronavirus 2 NEGATIVE NEGATIVE Final    Comment: (NOTE) SARS-CoV-2 target nucleic acids are NOT DETECTED.  The SARS-CoV-2 RNA is generally detectable in upper and lower respiratory specimens during the acute phase of infection. The lowest concentration of  SARS-CoV-2 viral copies this assay can detect is 250 copies / mL. A negative result does not preclude SARS-CoV-2 infection and should not be used as the sole basis for treatment or other patient management decisions.  A negative result may occur with improper specimen collection / handling, submission of specimen other than nasopharyngeal swab, presence of viral mutation(s) within the areas targeted by this assay, and inadequate number of viral copies (<250 copies / mL). A negative result must be combined with clinical observations, patient history, and epidemiological information.  Fact Sheet for Patients:   StrictlyIdeas.no  Fact Sheet for Healthcare Providers: BankingDealers.co.za  This test is not yet approved or  cleared by the [place] FDA and has been authorized for detection and/or diagnosis of SARS-CoV-2 by FDA under an Emergency Use Authorization (EUA).  This EUA will remain in effect (meaning this test can be used) for the duration of the COVID-19 declaration under Section 564(b)(1) of the Act, 21 U.S.C. section 360bbb-3(b)(1), unless the authorization is terminated or revoked sooner.  Performed at [facility] [place] [address], [place], [place] [address]       Imaging Studies   MR 3D Recon At Scanner  Result Date: 03/27/2020 CLINICAL DATA:  Jaundice extensive liver metastases from unknown primary EXAM: MRI ABDOMEN WITHOUT AND WITH CONTRAST (INCLUDING MRCP) TECHNIQUE: Multiplanar multisequence MR imaging of the abdomen was performed both before and after the administration of intravenous contrast. Heavily T2-weighted images of the biliary and pancreatic ducts were obtained, and three-dimensional MRCP images were rendered by post processing. CONTRAST:  82mL GADAVIST GADOBUTROL 1 MMOL/ML IV SOLN COMPARISON:  CT abdomen and pelvis of March 23, 2020 FINDINGS: Lower chest: RIGHT-sided pleural effusion similar  to previous evaluation. Is basilar airspace disease. Similar to prior study. Mediastinal adenopathy better displayed on a recent CT angiogram of the chest. Hepatobiliary: Marked worsening of hepatic metastatic disease as on the recent CT of the abdomen and pelvis near confluent in the LEFT hepatic lobe. Gallbladder is decompressed otherwise unremarkable. Areas of segmental biliary ductal distension in the RIGHT hepatic lobe seen to anterior inferior RIGHT hemi liver and anterior superior RIGHT hemi liver likely due to local mass effect. Common duct is nondilated as are RIGHT and LEFT hepatic ducts. Gallbladder is collapsed upon itself likely accounting for the appearance on the previous imaging study. Portal vein is patent. Pancreas: No mass, inflammatory changes, or other parenchymal abnormality identified. Spleen:  Normal size without suspicious focal lesion. Adrenals/Urinary Tract: Adrenal glands are normal. LEFT renal cyst. No hydronephrosis. Stomach/Bowel: Limited assessment of the gastrointestinal tract with moderate colonic distension in the upper abdomen nearly 7 cm. Vascular/Lymphatic: Atheromatous plaque of the abdominal aorta, no aneurysmal dilation. Adenopathy in the retroperitoneum and upper abdomen is similar to the previous study. Other:  Ascites better demonstrated on CT of the abdomen and pelvis.  Musculoskeletal: No suspicious bone lesions identified. IMPRESSION: 1. Marked worsening of hepatic metastatic disease as on the recent CT of the abdomen and pelvis near confluent in the LEFT hepatic lobe. 2. Areas of segmental biliary ductal distension in the RIGHT hepatic lobe seen to anterior inferior RIGHT hemi liver and anterior superior RIGHT hemi liver likely due to local mass effect. Scattered small peripheral biliary radicles throughout the liver in other areas with less dilation, essentially mild peripheral dilation elsewhere. This is likely due to local areas of mass effect in the setting of  worsening of hepatic metastatic disease which may also explain the degree of hepatic dysfunction. 3. Collapsed gallbladder 4. Common duct is nondilated as are RIGHT and LEFT hepatic ducts. 5. Adenopathy in the retroperitoneum and upper abdomen is similar to the previous study. Also on some series adenopathy in the chest is partially imaged, better seen on recent chest imaging from May of 2021 but likely enlarged since that time. 6. Limited assessment of the gastrointestinal tract with moderate colonic distension in the upper abdomen nearly 7 cm. Correlate with any clinical signs of developing colonic ileus. 7. RIGHT-sided pleural effusion similar to previous evaluation. 8. Aortic atherosclerosis. Aortic Atherosclerosis (ICD10-I70.0). Electronically Signed   By: Zetta Bills M.D.   On: 03/27/2020 15:35   MR ABDOMEN MRCP W WO CONTAST  Result Date: 03/27/2020 CLINICAL DATA:  Jaundice extensive liver metastases from unknown primary EXAM: MRI ABDOMEN WITHOUT AND WITH CONTRAST (INCLUDING MRCP) TECHNIQUE: Multiplanar multisequence MR imaging of the abdomen was performed both before and after the administration of intravenous contrast. Heavily T2-weighted images of the biliary and pancreatic ducts were obtained, and three-dimensional MRCP images were rendered by post processing. CONTRAST:  66mL GADAVIST GADOBUTROL 1 MMOL/ML IV SOLN COMPARISON:  CT abdomen and pelvis of March 23, 2020 FINDINGS: Lower chest: RIGHT-sided pleural effusion similar to previous evaluation. Is basilar airspace disease. Similar to prior study. Mediastinal adenopathy better displayed on a recent CT angiogram of the chest. Hepatobiliary: Marked worsening of hepatic metastatic disease as on the recent CT of the abdomen and pelvis near confluent in the LEFT hepatic lobe. Gallbladder is decompressed otherwise unremarkable. Areas of segmental biliary ductal distension in the RIGHT hepatic lobe seen to anterior inferior RIGHT hemi liver and anterior  superior RIGHT hemi liver likely due to local mass effect. Common duct is nondilated as are RIGHT and LEFT hepatic ducts. Gallbladder is collapsed upon itself likely accounting for the appearance on the previous imaging study. Portal vein is patent. Pancreas: No mass, inflammatory changes, or other parenchymal abnormality identified. Spleen:  Normal size without suspicious focal lesion. Adrenals/Urinary Tract: Adrenal glands are normal. LEFT renal cyst. No hydronephrosis. Stomach/Bowel: Limited assessment of the gastrointestinal tract with moderate colonic distension in the upper abdomen nearly 7 cm. Vascular/Lymphatic: Atheromatous plaque of the abdominal aorta, no aneurysmal dilation. Adenopathy in the retroperitoneum and upper abdomen is similar to the previous study. Other:  Ascites better demonstrated on CT of the abdomen and pelvis. Musculoskeletal: No suspicious bone lesions identified. IMPRESSION: 1. Marked worsening of hepatic metastatic disease as on the recent CT of the abdomen and pelvis near confluent in the LEFT hepatic lobe. 2. Areas of segmental biliary ductal distension in the RIGHT hepatic lobe seen to anterior inferior RIGHT hemi liver and anterior superior RIGHT hemi liver likely due to local mass effect. Scattered small peripheral biliary radicles throughout the liver in other areas with less dilation, essentially mild peripheral dilation elsewhere. This is likely due to local areas  of mass effect in the setting of worsening of hepatic metastatic disease which may also explain the degree of hepatic dysfunction. 3. Collapsed gallbladder 4. Common duct is nondilated as are RIGHT and LEFT hepatic ducts. 5. Adenopathy in the retroperitoneum and upper abdomen is similar to the previous study. Also on some series adenopathy in the chest is partially imaged, better seen on recent chest imaging from May of 2021 but likely enlarged since that time. 6. Limited assessment of the gastrointestinal tract with  moderate colonic distension in the upper abdomen nearly 7 cm. Correlate with any clinical signs of developing colonic ileus. 7. RIGHT-sided pleural effusion similar to previous evaluation. 8. Aortic atherosclerosis. Aortic Atherosclerosis (ICD10-I70.0). Electronically Signed   By: Zetta Bills M.D.   On: 03/27/2020 15:35     Medications   Scheduled Meds: . aspirin EC  81 mg Oral Daily  . feeding supplement  1 Container Oral TID BM  . fluticasone furoate-vilanterol  1 puff Inhalation Daily  . lactulose  20 g Oral TID  . polyethylene glycol  17 g Oral Daily   Continuous Infusions: . sodium chloride Stopped (03/26/20 1344)       LOS: 6 days     Bonnielee Haff,  [facility]  03/29/2020, 10:46 AM    If 7PM-7AM, please contact night-coverage. How to contact the [facility] Attending or Consulting provider [place] or covering provider during after hours [place], for this patient?    1. Check the care team in [facility] and look for a) attending/consulting TRH provider listed and b) the [facility] team listed 2. Log into [email or web address] and use [place]'s universal password to access. If you do not have the password, please contact the hospital operator. 3. Locate the [facility] provider you are looking for under [facility] and page to a number that you can be directly reached. 4. If you still have difficulty reaching the provider, please page the [facility] (Director on Call) for the Hospitalists listed on amion for assistance.

## 2020-03-29 NOTE — Progress Notes (Signed)
PMT no charge note  Chart reviewed, discussed with [facility] colleague Elmyra Ricks, awaiting hospice bed at local residential hospice. Continue current mode of care, no additional PMT specific recommendations at this time.   Loistine Chance MD [place] palliative [phone].

## 2020-03-29 NOTE — TOC Transition Note (Signed)
Transition of Care [facility]) - CM/SW Discharge Note   Patient Details  Name: Charles Mccoy MRN: [id number] Date of Birth: [birth year]  Transition of Care [facility] CM/SW Contact:  Lia Hopping, [place] Phone Number: 03/29/2020, 2:27 PM   Clinical Narrative:    BP ready to accept the patient. PTAR arranged for transport.  DNR signed  Nurse call report: [phone]  D/C Summary Fax# [phone]   Final next level of care: [place] Barriers to Discharge: No Barriers Identified   Patient Goals and CMS Choice     Choice offered to / list presented to : Adult Children, Spouse  Discharge Placement                Patient to be transferred to facility by: [place] Name of family member notified: Daughter and Spouse Patient and family notified of of transfer: 03/29/20  Discharge Plan and Services                DME Arranged: N/A DME Agency: NA       HH Arranged: NA HH Agency: NA        Social Determinants of Health (SDOH) Interventions     Readmission Risk Interventions No flowsheet data found.

## 2020-03-30 ENCOUNTER — Telehealth: Payer: Self-pay | Admitting: *Deleted

## 2020-04-06 LAB — SURGICAL PATHOLOGY

## 2020-04-06 NOTE — Telephone Encounter (Signed)
Patient died today at 72 at [facility].

## 2020-04-06 DEATH — deceased

## 2020-05-03 ENCOUNTER — Other Ambulatory Visit: Payer: Self-pay | Admitting: Internal Medicine

## 2020-05-25 ENCOUNTER — Other Ambulatory Visit: Payer: Self-pay | Admitting: Internal Medicine

## 2020-06-01 ENCOUNTER — Other Ambulatory Visit: Payer: Self-pay

## 2020-06-01 MED ORDER — ASPIRIN 81 MG PO TBEC: 81.0000 mg | DELAYED_RELEASE_TABLET | Freq: Every day | ORAL | 0 refills | Status: AC

## 2020-06-19 ENCOUNTER — Ambulatory Visit: Payer: Medicare Other | Admitting: Cardiovascular Disease

## 2020-09-01 IMAGING — CT CT ABD-PELV W/ CM
2 of 5 series · 14 of 46 positions shown, 16 images · IV contrast (omnipaque)
Comparison: 03/01/2020

CLINICAL DATA: Abdominal distension, abdominal pain with elevated
LFTs with known metastatic disease to the liver.

EXAM:
CT ABDOMEN AND PELVIS WITH CONTRAST
TECHNIQUE: Multidetector CT imaging of the abdomen and pelvis was performed
using the standard protocol following bolus administration of
intravenous contrast.
CONTRAST:  100mL OMNIPAQUE IOHEXOL 300 MG/ML  SOLN

[Series 2: axial st · axial · 0.90mm/px · z∈[+1103,+1593]mm · 11 of 114 slices shown, 13 images]
[im 8/114  soft-tissue]
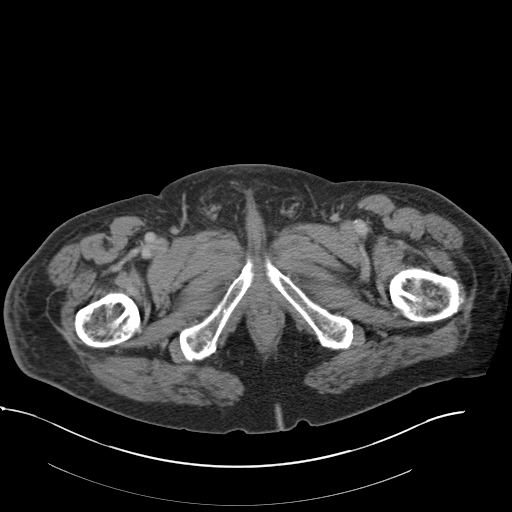
[im 8/114  bone]
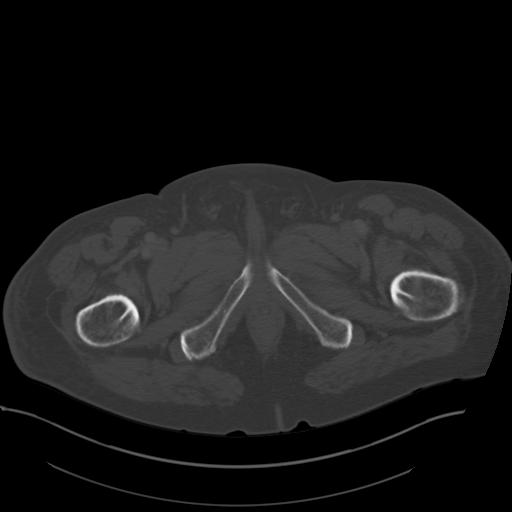
[im 16/114  soft-tissue]
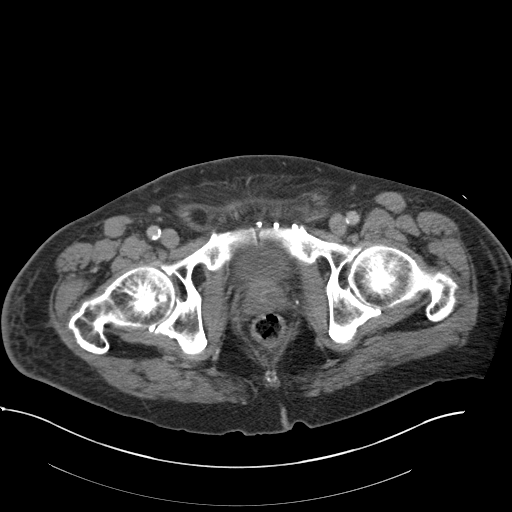
[im 31/114  soft-tissue]
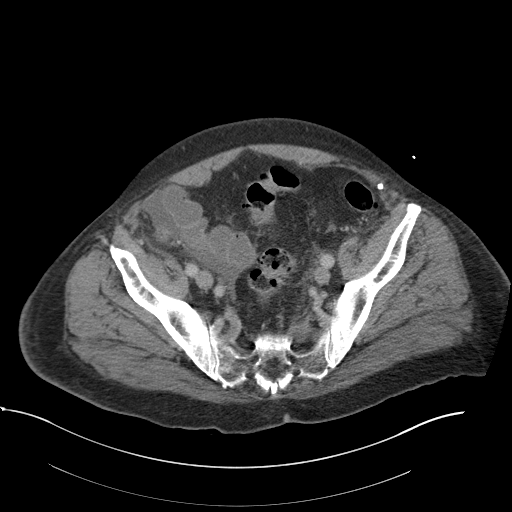
[im 38/114  soft-tissue]
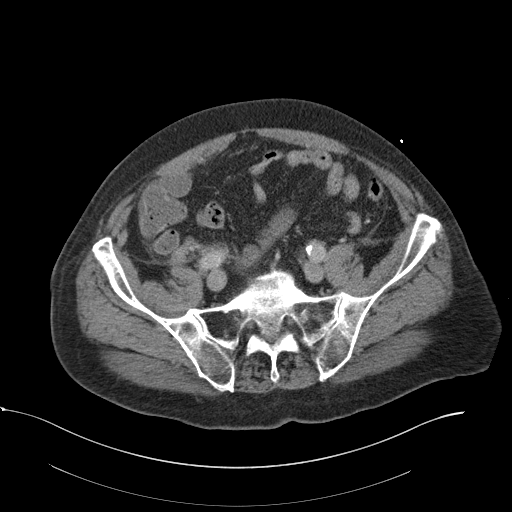
[im 46/114  soft-tissue]
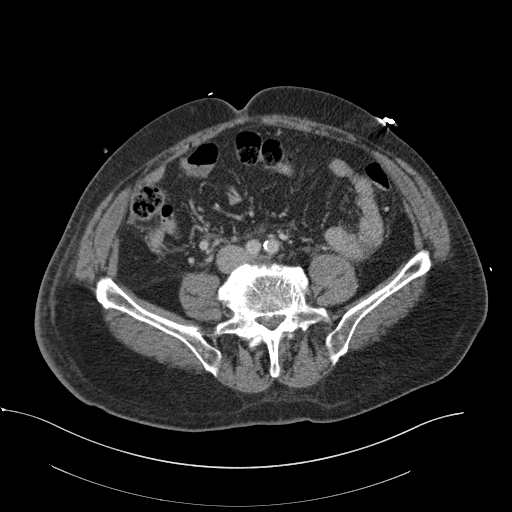
[im 61/114  soft-tissue]
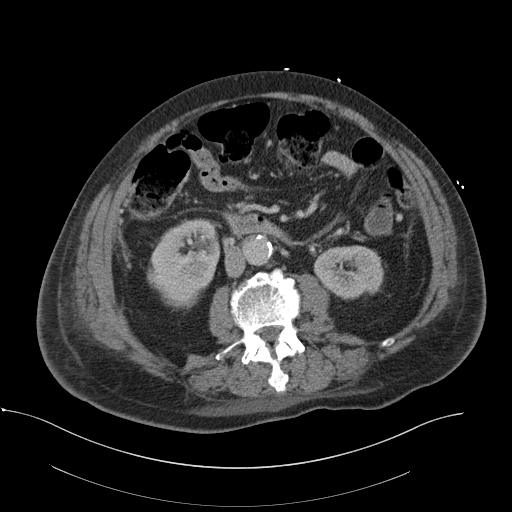
[im 68/114  soft-tissue]
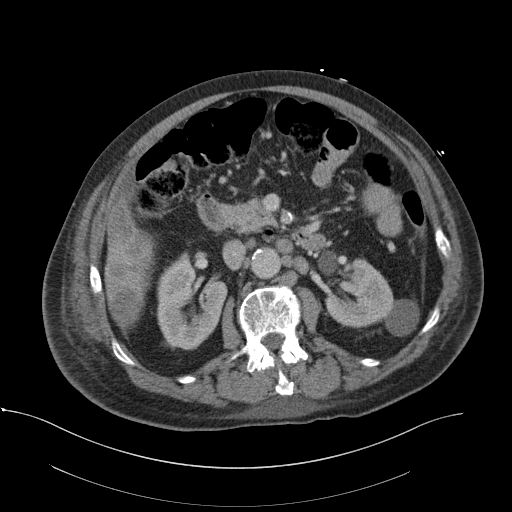
[im 76/114  soft-tissue]
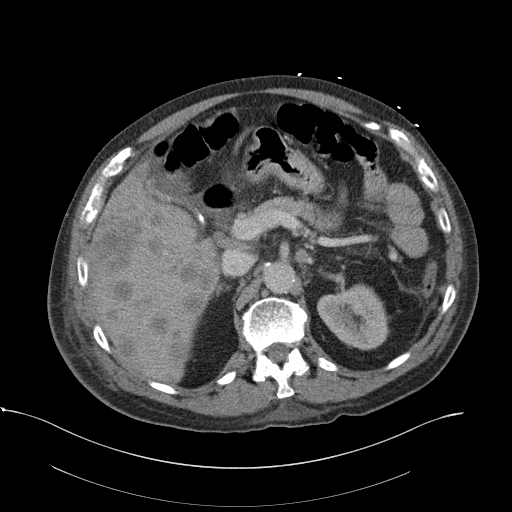
[im 83/114  soft-tissue]
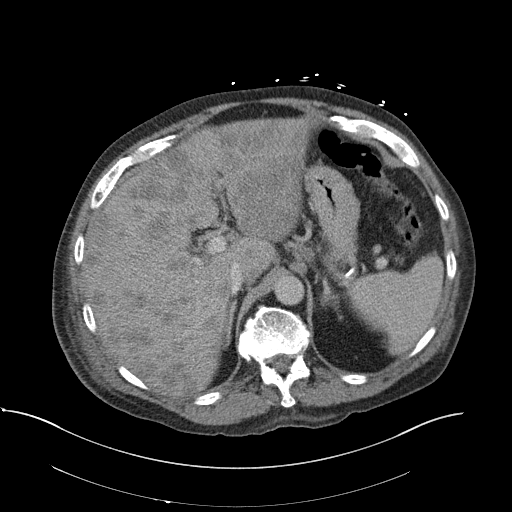
[im 83/114  bone]
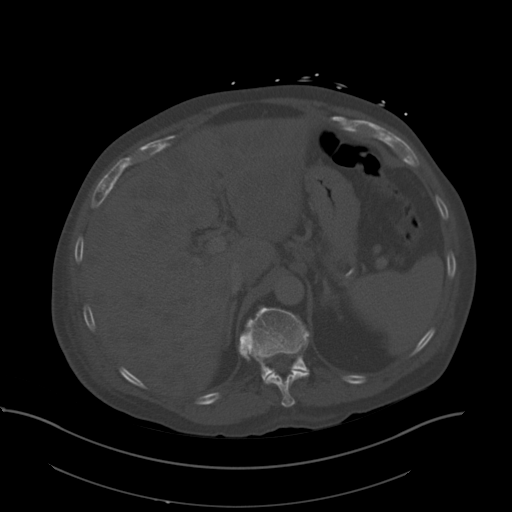
[im 98/114  soft-tissue]
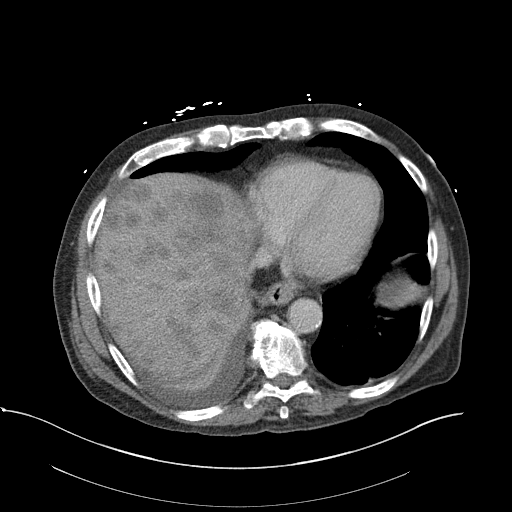
[im 106/114  soft-tissue]
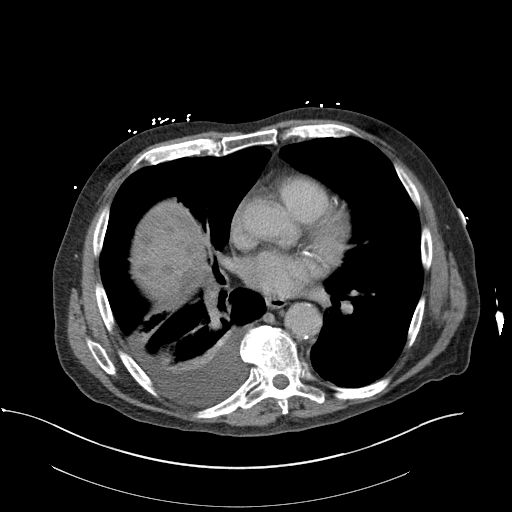

[Series 5: coronal st · coronal · 0.82mm/px · 3 of 166 slices shown]
[im 56/166  soft-tissue]
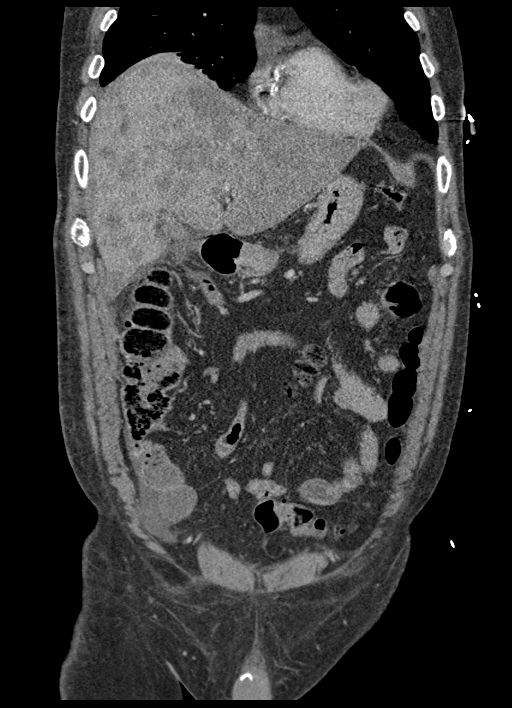
[im 74/166  soft-tissue]
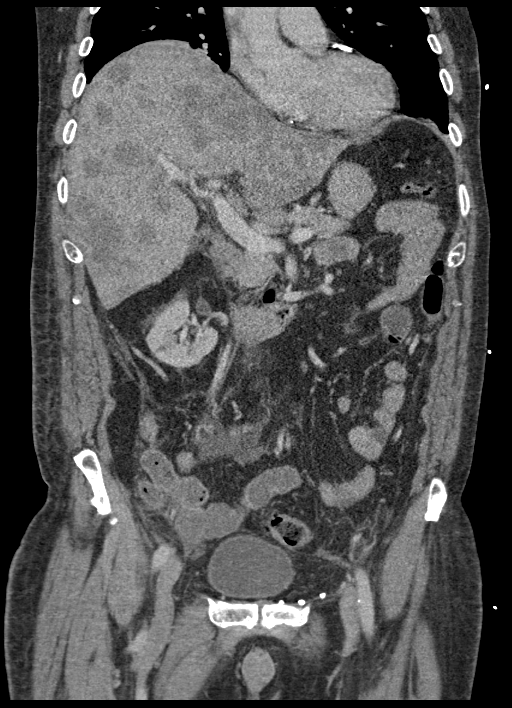
[im 92/166  soft-tissue]
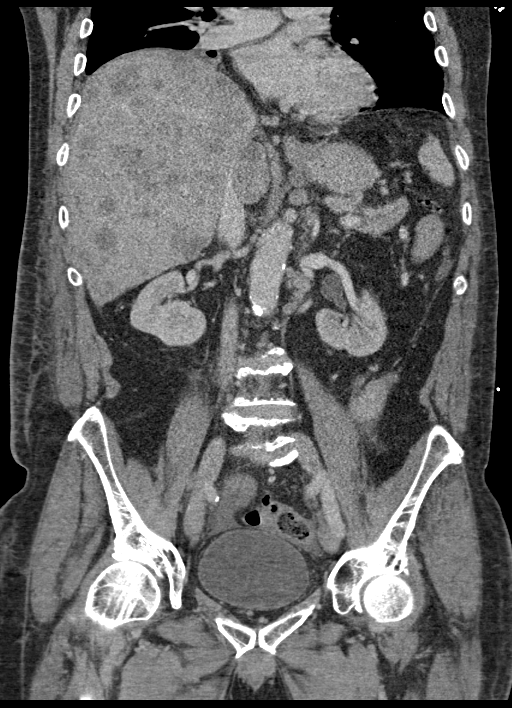

[14 of 46 positions shown; findings below may reference images not displayed]

FINDINGS: Lower chest: Interval development of RIGHT-sided effusion and
basilar airspace disease since the prior study. Calcified coronary
artery disease. No pericardial effusion. Heart is incompletely
imaged. Minimal atelectasis at the LEFT lung base.

Hepatobiliary: Multifocal hepatic metastatic lesions now more
confluent and enlarging when compared to the prior study.

(Image 26, series [DATE] cm lesion previously 2.9 cm.

Near confluent disease in the LEFT hepatic lobe on today's exam.

Area of confluent disease in the RIGHT hepatic lobe (image 39,
series [DATE] cm at most 3.7 cm on the prior study and without
discrete intervening normal hepatic parenchyma between lesions in
this area.

Portal vein is patent. Gallbladder is collapsed. Question of lack of
enhancement of the medial wall of the gallbladder. In the current
context the significance is uncertain the gallbladder could be
collapse upon itself. Signs of ascites and small pericholecystic
fluid. Portal vein is patent.

Pancreas: Pancreas is normal without ductal dilation or
inflammation.

Spleen: Spleen normal size and contour.

Adrenals/Urinary Tract: Adrenal glands are normal.

Symmetric renal enhancement. No hydronephrosis. LEFT renal cyst
arises from lateral LEFT kidney as before. Urinary bladder is normal
to the extent evaluated aside from LEFT posterolateral bladder
diverticulum.

Stomach/Bowel: Stomach is under distended. Small bowel without overt
signs of obstruction. Mildly dilated distal small bowel loops filled
with liquid stool like material. Stool and gas filling the proximal
colon. Colonic diverticulosis. No diverticulitis.

The appendix is normal.

Vascular/Lymphatic: Calcific and noncalcific atheromatous plaque of
the abdominal aorta. Mild dilation without aneurysm. 2.9 cm greatest
axial dimension. Retroperitoneal adenopathy. (Image 46, series [DATE] cm previously 1 cm short axis.

Periportal nodal enlargement is similar. Juxta crural lymph node and
hepatic gastric lymph nodes are similar.

Reproductive: Prostate is unremarkable by CT.

Other: Post LEFT inguinal herniorrhaphy. Ascites which is new from
the prior study. No signs of free air.

Musculoskeletal: Spinal degenerative changes. No acute or
destructive bone process.
IMPRESSION: 1. Given new ascites and lack of gallbladder wall enhancement with
collapse of the gallbladder since previous imaging studies would
suggest focused assessment with gallbladder ultrasound to ensure
gallbladder wall integrity. HIDA scan is likely to be un informative
at this time due to hepatic dysfunction.
2. Multifocal hepatic metastatic lesions now more confluent and
enlarging when compared to the prior study.
3. New small volume ascites is more likely related to worsening
hepatic dysfunction in the setting of extensive hepatic metastatic
disease.
4. Interval development of RIGHT-sided effusion and basilar airspace
disease since the prior study.
5. Colonic diverticulosis without diverticulitis.
6. Aortic atherosclerosis.

Aortic Atherosclerosis ([id number]).
